# Patient Record
Sex: Male | Born: 1945 | Race: White | Hispanic: No | Marital: Married | Smoking: Former smoker
Health system: Southern US, Community
[De-identification: ages and names within clinical notes are randomized; demographics above are authoritative.]

## PROBLEM LIST (undated history)

## (undated) DIAGNOSIS — C4491 Basal cell carcinoma of skin, unspecified: Secondary | ICD-10-CM

## (undated) DIAGNOSIS — H269 Unspecified cataract: Secondary | ICD-10-CM

## (undated) DIAGNOSIS — Z803 Family history of malignant neoplasm of breast: Secondary | ICD-10-CM

## (undated) DIAGNOSIS — T7840XA Allergy, unspecified, initial encounter: Secondary | ICD-10-CM

## (undated) DIAGNOSIS — Z9861 Coronary angioplasty status: Secondary | ICD-10-CM

## (undated) DIAGNOSIS — Z800 Family history of malignant neoplasm of digestive organs: Secondary | ICD-10-CM

## (undated) DIAGNOSIS — R011 Cardiac murmur, unspecified: Secondary | ICD-10-CM

## (undated) DIAGNOSIS — I2510 Atherosclerotic heart disease of native coronary artery without angina pectoris: Secondary | ICD-10-CM

## (undated) DIAGNOSIS — L719 Rosacea, unspecified: Secondary | ICD-10-CM

## (undated) DIAGNOSIS — E785 Hyperlipidemia, unspecified: Secondary | ICD-10-CM

## (undated) DIAGNOSIS — K635 Polyp of colon: Secondary | ICD-10-CM

## (undated) DIAGNOSIS — I10 Essential (primary) hypertension: Secondary | ICD-10-CM

## (undated) DIAGNOSIS — E119 Type 2 diabetes mellitus without complications: Secondary | ICD-10-CM

## (undated) HISTORY — DX: Family history of malignant neoplasm of digestive organs: Z80.0

## (undated) HISTORY — PX: EYE SURGERY: SHX253

## (undated) HISTORY — DX: Rosacea, unspecified: L71.9

## (undated) HISTORY — DX: Polyp of colon: K63.5

## (undated) HISTORY — DX: Essential (primary) hypertension: I10

## (undated) HISTORY — PX: OTHER SURGICAL HISTORY: SHX169

## (undated) HISTORY — DX: Hyperlipidemia, unspecified: E78.5

## (undated) HISTORY — DX: Allergy, unspecified, initial encounter: T78.40XA

## (undated) HISTORY — DX: Family history of malignant neoplasm of breast: Z80.3

## (undated) HISTORY — DX: Atherosclerotic heart disease of native coronary artery without angina pectoris: I25.10

## (undated) HISTORY — DX: Type 2 diabetes mellitus without complications: E11.9

## (undated) HISTORY — DX: Cardiac murmur, unspecified: R01.1

## (undated) HISTORY — PX: BELPHAROPTOSIS REPAIR: SHX369

## (undated) HISTORY — DX: Unspecified cataract: H26.9

## (undated) HISTORY — DX: Atherosclerotic heart disease of native coronary artery without angina pectoris: Z98.61

## (undated) HISTORY — DX: Basal cell carcinoma of skin, unspecified: C44.91

---

## 1981-09-23 HISTORY — PX: SHOULDER OPEN ROTATOR CUFF REPAIR: SHX2407

## 2006-05-17 ENCOUNTER — Emergency Department (HOSPITAL_COMMUNITY): Admission: EM | Admit: 2006-05-17 | Discharge: 2006-05-17 | Payer: Self-pay | Admitting: Emergency Medicine

## 2012-07-09 ENCOUNTER — Ambulatory Visit (INDEPENDENT_AMBULATORY_CARE_PROVIDER_SITE_OTHER): Payer: BC Managed Care – PPO | Admitting: Emergency Medicine

## 2012-07-09 VITALS — BP 153/82 | HR 72 | Temp 97.4°F | Resp 18 | Ht 68.5 in | Wt 219.8 lb

## 2012-07-09 DIAGNOSIS — Z23 Encounter for immunization: Secondary | ICD-10-CM

## 2012-07-09 DIAGNOSIS — E1169 Type 2 diabetes mellitus with other specified complication: Secondary | ICD-10-CM | POA: Insufficient documentation

## 2012-07-09 DIAGNOSIS — N529 Male erectile dysfunction, unspecified: Secondary | ICD-10-CM

## 2012-07-09 DIAGNOSIS — E785 Hyperlipidemia, unspecified: Secondary | ICD-10-CM | POA: Insufficient documentation

## 2012-07-09 DIAGNOSIS — Z0000 Encounter for general adult medical examination without abnormal findings: Secondary | ICD-10-CM

## 2012-07-09 DIAGNOSIS — I10 Essential (primary) hypertension: Secondary | ICD-10-CM

## 2012-07-09 DIAGNOSIS — E782 Mixed hyperlipidemia: Secondary | ICD-10-CM

## 2012-07-09 DIAGNOSIS — E119 Type 2 diabetes mellitus without complications: Secondary | ICD-10-CM

## 2012-07-09 DIAGNOSIS — E1165 Type 2 diabetes mellitus with hyperglycemia: Secondary | ICD-10-CM | POA: Insufficient documentation

## 2012-07-09 LAB — POCT UA - MICROSCOPIC ONLY
Bacteria, U Microscopic: NEGATIVE
Crystals, Ur, HPF, POC: NEGATIVE
RBC, urine, microscopic: NEGATIVE
WBC, Ur, HPF, POC: NEGATIVE

## 2012-07-09 LAB — POCT URINALYSIS DIPSTICK
Bilirubin, UA: NEGATIVE
Glucose, UA: NEGATIVE
Leukocytes, UA: NEGATIVE
Nitrite, UA: NEGATIVE
Urobilinogen, UA: 0.2

## 2012-07-09 LAB — POCT CBC
Granulocyte percent: 61.7 %G (ref 37–80)
HCT, POC: 44.3 % (ref 43.5–53.7)
Hemoglobin: 14 g/dL — AB (ref 14.1–18.1)
Lymph, poc: 2.8 (ref 0.6–3.4)
MCHC: 31.6 g/dL — AB (ref 31.8–35.4)
MPV: 9.3 fL (ref 0–99.8)
POC Granulocyte: 5.3 (ref 2–6.9)
POC MID %: 6.1 %M (ref 0–12)
RBC: 4.7 M/uL (ref 4.69–6.13)

## 2012-07-09 LAB — COMPREHENSIVE METABOLIC PANEL WITH GFR
ALT: 31 U/L (ref 0–53)
AST: 22 U/L (ref 0–37)
Albumin: 4.8 g/dL (ref 3.5–5.2)
Alkaline Phosphatase: 52 U/L (ref 39–117)
BUN: 23 mg/dL (ref 6–23)
CO2: 25 meq/L (ref 19–32)
Calcium: 9.8 mg/dL (ref 8.4–10.5)
Chloride: 105 meq/L (ref 96–112)
Creat: 0.79 mg/dL (ref 0.50–1.35)
Glucose, Bld: 109 mg/dL — ABNORMAL HIGH (ref 70–99)
Potassium: 4.4 meq/L (ref 3.5–5.3)
Sodium: 139 meq/L (ref 135–145)
Total Bilirubin: 0.6 mg/dL (ref 0.3–1.2)
Total Protein: 7.3 g/dL (ref 6.0–8.3)

## 2012-07-09 LAB — IFOBT (OCCULT BLOOD): IFOBT: NEGATIVE

## 2012-07-09 LAB — LIPID PANEL: LDL Cholesterol: 127 mg/dL — ABNORMAL HIGH (ref 0–99)

## 2012-07-09 LAB — TSH: TSH: 1.387 u[IU]/mL (ref 0.350–4.500)

## 2012-07-09 NOTE — Progress Notes (Signed)
[facility] [address] [place] [address] [phone]  Date:  07/09/2012   Name:  Gregory Mathews.   DOB:  [birth year]   MRN:  [id number]  PCP:  No primary provider on file.    Chief Complaint: Annual Exam   History of Present Illness:  Gregory Mathews. is a 66 y.o. very pleasant male patient who presents with the following:  Wellness physical.  Says that his glucose runs less than 200 on oral agent.  Never less than 150.  Exercises on treadmill 30 minutes daily.  Last physical a year ago.  Colonoscopy 7 years ago.  Patient Active Problem List  Diagnosis  . Type II or unspecified type diabetes mellitus without mention of complication, not stated as uncontrolled  . Hyperlipidemia  . Erectile dysfunction    Past Medical History  Diagnosis Date  . Diabetes mellitus without complication     Past Surgical History  Procedure Date  . Shoulder open rotator cuff repair 1983    History  Substance Use Topics  . Smoking status: Former Smoker    Quit date: 07/09/1982  . Smokeless tobacco: Former Neurosurgeon    Quit date: 07/10/1983  . Alcohol Use: No    Family History  Problem Relation Age of Onset  . Cancer Mother   . Heart disease Father   . Diabetes Sister   . Heart disease Brother   . Cancer Maternal Grandmother   . Stroke Maternal Grandfather   . Heart disease Brother   . Diabetes Brother   . Cancer Brother     No Known Allergies  Medication list has been reviewed and updated.  Current Outpatient Prescriptions on File Prior to Visit  Medication Sig Dispense Refill  . glipiZIDE (GLUCOTROL) 5 MG tablet Take 5 mg by mouth 2 (two) times daily before a meal.      . lisinopril (PRINIVIL,ZESTRIL) 5 MG tablet Take 5 mg by mouth daily.        Review of Systems:  As per HPI, otherwise negative.    Physical Examination: Filed Vitals:   07/09/12 1027  BP: 153/82  Pulse: 72  Temp: 97.4 F (36.3 C)  Resp: 18   Filed Vitals:   07/09/12 1027  Height: 5' 8.5" (1.74 m)  Weight: 219 lb 12.8 oz (99.701 kg)   Body mass index is 32.93 kg/(m^2). Ideal Body Weight: Weight in (lb) to have BMI = 25: 166.5   GEN: WDWN, NAD, Non-toxic, A & O x 3 HEENT: Atraumatic, Normocephalic. Neck supple. No masses, No LAD. Ears and Nose: No external deformity. CV: RRR, No M/G/R. No JVD. No thrill. No extra heart sounds. PULM: CTA B, no wheezes, crackles, rhonchi. No retractions. No resp. distress. No accessory muscle use. ABD: S, NT, ND, +BS. No rebound. No HSM. EXTR: No c/c/e NEURO Normal gait.  PSYCH: Normally interactive. Conversant. Not depressed or anxious appearing.  Calm demeanor.  Rectal: normal exam  Assessment and Plan: Wellness exam Labs pending  Carmelina Dane, MD  I have reviewed and agree with documentation. Robert P. Merla Riches, M.D.

## 2012-07-10 LAB — VITAMIN D 25 HYDROXY (VIT D DEFICIENCY, FRACTURES): Vit D, 25-Hydroxy: 51 ng/mL (ref 30–89)

## 2012-07-10 LAB — PSA: PSA: 0.82 ng/mL (ref <=4.00)

## 2012-07-12 ENCOUNTER — Other Ambulatory Visit: Payer: Self-pay | Admitting: *Deleted

## 2012-07-12 MED ORDER — LISINOPRIL 5 MG PO TABS: 5.0000 mg | ORAL_TABLET | Freq: Every day | ORAL | Status: DC

## 2012-07-12 MED ORDER — GLIPIZIDE ER 10 MG PO TB24: 10.0000 mg | ORAL_TABLET | Freq: Every day | ORAL | Status: DC

## 2012-09-13 ENCOUNTER — Ambulatory Visit: Payer: BC Managed Care – PPO

## 2012-09-14 ENCOUNTER — Ambulatory Visit (INDEPENDENT_AMBULATORY_CARE_PROVIDER_SITE_OTHER): Payer: BC Managed Care – PPO | Admitting: Emergency Medicine

## 2012-09-14 VITALS — BP 156/79 | HR 80 | Temp 98.6°F | Resp 17 | Ht 69.0 in | Wt 226.0 lb

## 2012-09-14 DIAGNOSIS — J018 Other acute sinusitis: Secondary | ICD-10-CM

## 2012-09-14 DIAGNOSIS — J209 Acute bronchitis, unspecified: Secondary | ICD-10-CM

## 2012-09-14 MED ORDER — HYDROCOD POLST-CHLORPHEN POLST 10-8 MG/5ML PO LQCR: 5.0000 mL | Freq: Two times a day (BID) | ORAL | Status: DC | PRN

## 2012-09-14 MED ORDER — PSEUDOEPHEDRINE-GUAIFENESIN ER 60-600 MG PO TB12: 1.0000 | ORAL_TABLET | Freq: Two times a day (BID) | ORAL | Status: DC

## 2012-09-14 MED ORDER — AMOXICILLIN-POT CLAVULANATE 875-125 MG PO TABS: 1.0000 | ORAL_TABLET | Freq: Two times a day (BID) | ORAL | Status: DC

## 2012-09-14 NOTE — Progress Notes (Signed)
[facility] [address], [place] [address] [phone]  Date:  09/14/2012   Name:  Gregory Mathews.   DOB:  [birth year]   MRN:  [id number]  PCP:  No primary provider on file.    Chief Complaint: Sore Throat and Facial Pain   History of Present Illness:  Gregory Mathews. is a 66 y.o. very pleasant male patient who presents with the following:  Ill for one week.  Has nasal congestion and sinus pressure.  Purulent nasal drainage and post nasal drip.  Sore throat.  Cough productive purulent sputums.  No fever or chills, nausea or vomiting.  No wheezing or shortness of breath. 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  No improvement with OTC medication.  Patient Active Problem List  Diagnosis  . Type II or unspecified type diabetes mellitus without mention of complication, not stated as uncontrolled  . Hyperlipidemia  . Erectile dysfunction    Past Medical History  Diagnosis Date  . Diabetes mellitus without complication     Past Surgical History  Procedure Date  . Shoulder open rotator cuff repair 1983    History  Substance Use Topics  . Smoking status: Former Smoker    Quit date: 07/09/1982  . Smokeless tobacco: Former Neurosurgeon    Quit date: 07/10/1983  . Alcohol Use: No    Family History  Problem Relation Age of Onset  . Cancer Mother   . Heart disease Father   . Diabetes Sister   . Heart disease Brother   . Cancer Maternal Grandmother   . Stroke Maternal Grandfather   . Heart disease Brother   . Diabetes Brother   . Cancer Brother     No Known Allergies  Medication list has been reviewed and updated.  Current Outpatient Prescriptions on File Prior to Visit  Medication Sig Dispense Refill  . aspirin 81 MG tablet Take 81 mg by mouth daily.      Marland Kitchen glipiZIDE (GLUCOTROL XL) 10 MG 24 hr tablet Take 1 tablet (10 mg total) by mouth daily.  30 tablet  5  . lisinopril (PRINIVIL,ZESTRIL) 5 MG tablet Take 1 tablet (5 mg total) by mouth daily.   30 tablet  5  . Multiple Vitamin (MULTIVITAMIN) tablet Take 1 tablet by mouth daily.        Review of Systems:  As per HPI, otherwise negative.    Physical Examination: Filed Vitals:   09/14/12 0823  BP: 156/79  Pulse: 80  Temp: 98.6 F (37 C)  Resp: 17   Filed Vitals:   09/14/12 0823  Height: 5\' 9"  (1.753 m)  Weight: 226 lb (102.513 kg)   Body mass index is 33.37 kg/(m^2). Ideal Body Weight: Weight in (lb) to have BMI = 25: 168.9   GEN: WDWN, NAD, Non-toxic, A & O x 3 HEENT: Atraumatic, Normocephalic. Neck supple. No masses, No LAD. Ears and Nose: No external deformity. CV: RRR, No M/G/R. No JVD. No thrill. No extra heart sounds. PULM: CTA B, no wheezes, crackles, rhonchi. No retractions. No resp. distress. No accessory muscle use. ABD: S, NT, ND, +BS. No rebound. No HSM. EXTR: No c/c/e NEURO Normal gait.  PSYCH: Normally interactive. Conversant. Not depressed or anxious appearing.  Calm demeanor.    Assessment and Plan: Sinusitis Bronchitis augmentin mucinex tussionex Follow up as needed  Carmelina Dane, MD

## 2012-12-28 ENCOUNTER — Other Ambulatory Visit: Payer: Self-pay | Admitting: Emergency Medicine

## 2012-12-30 ENCOUNTER — Ambulatory Visit (INDEPENDENT_AMBULATORY_CARE_PROVIDER_SITE_OTHER): Payer: BC Managed Care – PPO | Admitting: Physician Assistant

## 2012-12-30 VITALS — BP 120/78 | HR 79 | Temp 97.9°F | Resp 18 | Wt 221.0 lb

## 2012-12-30 DIAGNOSIS — E119 Type 2 diabetes mellitus without complications: Secondary | ICD-10-CM

## 2012-12-30 DIAGNOSIS — M25559 Pain in unspecified hip: Secondary | ICD-10-CM

## 2012-12-30 DIAGNOSIS — N529 Male erectile dysfunction, unspecified: Secondary | ICD-10-CM

## 2012-12-30 DIAGNOSIS — M25519 Pain in unspecified shoulder: Secondary | ICD-10-CM

## 2012-12-30 DIAGNOSIS — E785 Hyperlipidemia, unspecified: Secondary | ICD-10-CM

## 2012-12-30 DIAGNOSIS — M25511 Pain in right shoulder: Secondary | ICD-10-CM

## 2012-12-30 DIAGNOSIS — M25551 Pain in right hip: Secondary | ICD-10-CM

## 2012-12-30 LAB — LIPID PANEL
HDL: 36 mg/dL — ABNORMAL LOW (ref >39)
LDL Cholesterol: 132 mg/dL — ABNORMAL HIGH (ref 0–99)
Triglycerides: 108 mg/dL (ref <150)
VLDL: 22 mg/dL (ref 0–40)

## 2012-12-30 LAB — COMPREHENSIVE METABOLIC PANEL
Albumin: 4.7 g/dL (ref 3.5–5.2)
Alkaline Phosphatase: 49 U/L (ref 39–117)
BUN: 16 mg/dL (ref 6–23)
Glucose, Bld: 140 mg/dL — ABNORMAL HIGH (ref 70–99)
Total Bilirubin: 0.7 mg/dL (ref 0.3–1.2)

## 2012-12-30 MED ORDER — GLIPIZIDE ER 10 MG PO TB24: 10.0000 mg | ORAL_TABLET | Freq: Every day | ORAL | Status: DC

## 2012-12-30 MED ORDER — LISINOPRIL 5 MG PO TABS: 5.0000 mg | ORAL_TABLET | Freq: Every day | ORAL | Status: DC

## 2012-12-30 MED ORDER — GLUCOSE BLOOD VI STRP: ORAL_STRIP | Status: DC

## 2012-12-30 MED ORDER — METFORMIN HCL 500 MG PO TABS: 500.0000 mg | ORAL_TABLET | Freq: Every day | ORAL | Status: DC

## 2012-12-30 NOTE — Patient Instructions (Addendum)
Consider reducing your starches, which can raise your glucose and weight.  As a substitute, eat more leafy green veggies. I encourage you to take ibuprofen 400-800 mg three times daily with food for 2-3 weeks to see if you can reduce the inflammation causing the pain in your shoulder and hip.  If it's not helpful, let me know if you're ready to see a specialist. METFORMIN: start this once daily, in the morning, with breakfast.  Initially, it may give you some GI upset, most likely diarrhea.  If you tolerate it, we'll consider increasing the dose and decreasing (and hopefully stopping) the glipizide.

## 2012-12-30 NOTE — Progress Notes (Signed)
Subjective:    Patient ID: Gregory Alstrom., male    DOB: Dec 22, 1945, 67 y.o.   MRN: [id number]  HPI This 67 y.o. male presents for evaluation of DM type 2.  Frequency of home glucose monitoring: QD "I can control mine when I eat right and exercise" 110'2-120's. Goal is to lose 20 lbs. Walks about 45 minutes a day. "My problem is at supper time, that's when I get really hungry." Working on eating lean proteins, increased vegetables, but eats a lot of starches. Sees a dentist Q6 months, eye specialist annually. Checks feet some days. Is current with influenza vaccine. Is current with pneumococcal vaccine.  He reports a history of GI upset with metformin, but also that he wasn't taking it with food, which was suggested as the reason.  Understanding that it's a better long-term choice for diabetes treatment, he's interested in trying it again.  Past Medical History  Diagnosis Date  . Diabetes mellitus without complication   . Cataract     Past Surgical History  Procedure Laterality Date  . Shoulder open rotator cuff repair  1983    Prior to Admission medications   Medication Sig Start Date End Date Taking? Authorizing Provider  aspirin 81 MG tablet Take 81 mg by mouth daily.   Yes Historical Provider, MD  glipiZIDE (GLUCOTROL XL) 10 MG 24 hr tablet Take 1 tablet (10 mg total) by mouth daily. 07/12/12  Yes Phillips Odor, MD  glucose blood (ACCU-CHEK AVIVA PLUS) test strip Use to test blood sugar daily 12/28/12  Yes Marzella Schlein McClung, PA-C  lisinopril (PRINIVIL,ZESTRIL) 5 MG tablet Take 1 tablet (5 mg total) by mouth daily. 07/12/12  Yes Phillips Odor, MD  Multiple Vitamin (MULTIVITAMIN) tablet Take 1 tablet by mouth daily.   Yes Historical Provider, MD    No Known Allergies  History   Social History  . Marital Status: Married    Spouse Name: Bonita Quin    Number of Children: 4  . Years of Education: 20   Occupational History  . bus driver/transportation [employer]   Social History Main Topics  . Smoking status: Former Smoker -- 1.00 packs/day for 15 years    Quit date: 07/09/1982  . Smokeless tobacco: Never Used  . Alcohol Use: No  . Drug Use: No  . Sexually Active: Yes -- Male partner(s)   Other Topics Concern  . Not on file   Social History Narrative   Doctoral degree in Biblical Studies.  Lives with his wife.  Adult children all live locally.    Family History  Problem Relation Age of Onset  . Cancer Mother     leukemia  . Stroke Mother   . Heart disease Father   . Stroke Father   . Diabetes Sister   . Heart disease Brother   . Cancer Maternal Grandmother   . Stroke Maternal Grandfather   . Diabetes Brother   . Diabetes Brother   . Cancer Brother     colon cancer   Review of Systems Denies shortness of breath, HA, dizziness, vision change, nausea, vomiting, diarrhea, constipation, melena, hematochezia, dysuria, increased urinary urgency or frequency, increased hunger or thirst, unintentional weight change, rash. With exercise, has noticed hip pain and RIGHT shoulder pain, upper chest wall pain bilaterally. X 4-5 months.  "I don't like taking ibuprofen."     Objective:   Physical Exam Blood pressure 120/78, pulse 79, temperature 97.9 F (36.6 C), temperature source Oral, resp. rate 18, weight  221 lb (100.245 kg). Body mass index is 32.62 kg/(m^2). Well-developed, well nourished WM who is awake, alert and oriented, in NAD. HEENT: [place]/AT, sclera and conjunctiva are clear.   Neck: supple, non-tender, no lymphadenopathy, thyromegaly. Heart: RRR, no murmur Lungs: normal effort, CTA Extremities: no cyanosis, clubbing or edema. Varicose veins noted in both lower extremities, R>L. Skin: warm and dry without rash. Psychologic: good mood and appropriate affect, normal speech and behavior.  See DM foot exam.  Results for orders placed in visit on 12/30/12  GLUCOSE, POCT (MANUAL RESULT ENTRY)      Result Value Range   POC  Glucose 132 (*) 70 - 99 mg/dl  POCT GLYCOSYLATED HEMOGLOBIN (HGB A1C)      Result Value Range   Hemoglobin A1C 7.4        Assessment & Plan:  Type II or unspecified type diabetes mellitus without mention of complication, not stated as uncontrolled - Plan: POCT glucose (manual entry), POCT glycosylated hemoglobin (Hb A1C), Comprehensive metabolic panel, Microalbumin, urine, glipiZIDE (GLUCOTROL XL) 10 MG 24 hr tablet, glucose blood (ACCU-CHEK AVIVA PLUS) test strip, lisinopril (PRINIVIL,ZESTRIL) 5 MG tablet, metFORMIN (GLUCOPHAGE) 500 MG tablet  Hyperlipidemia - Plan: Lipid panel  Erectile dysfunction  Pain in shoulder, right  Pain, hip, right  Patient Instructions  Consider reducing your starches, which can raise your glucose and weight.  As a substitute, eat more leafy green veggies. I encourage you to take ibuprofen 400-800 mg three times daily with food for 2-3 weeks to see if you can reduce the inflammation causing the pain in your shoulder and hip.  If it's not helpful, let me know if you're ready to see a specialist. METFORMIN: start this once daily, in the morning, with breakfast.  Initially, it may give you some GI upset, most likely diarrhea.  If you tolerate it, we'll consider increasing the dose and decreasing (and hopefully stopping) the glipizide.   Fernande Bras, PA-C Physician Assistant-Certified [facility]

## 2013-01-04 ENCOUNTER — Encounter: Payer: Self-pay | Admitting: Physician Assistant

## 2013-01-21 DIAGNOSIS — C4491 Basal cell carcinoma of skin, unspecified: Secondary | ICD-10-CM

## 2013-01-21 HISTORY — DX: Basal cell carcinoma of skin, unspecified: C44.91

## 2013-01-26 ENCOUNTER — Ambulatory Visit (INDEPENDENT_AMBULATORY_CARE_PROVIDER_SITE_OTHER): Payer: BC Managed Care – PPO | Admitting: Family Medicine

## 2013-01-26 VITALS — BP 150/80 | HR 91 | Temp 99.5°F | Resp 17 | Ht 69.0 in | Wt 221.0 lb

## 2013-01-26 DIAGNOSIS — J309 Allergic rhinitis, unspecified: Secondary | ICD-10-CM

## 2013-01-26 DIAGNOSIS — J302 Other seasonal allergic rhinitis: Secondary | ICD-10-CM | POA: Insufficient documentation

## 2013-01-26 MED ORDER — AMOXICILLIN-POT CLAVULANATE 875-125 MG PO TABS: 1.0000 | ORAL_TABLET | Freq: Two times a day (BID) | ORAL | Status: DC

## 2013-01-26 MED ORDER — FLUTICASONE PROPIONATE 50 MCG/ACT NA SUSP: 2.0000 | Freq: Every day | NASAL | Status: DC

## 2013-01-26 NOTE — Patient Instructions (Addendum)
Thank you for coming in today. I think you have allergies.  Take the zyrtec daily.  Use the flonase for at least a month.  Use the antibiotic in 2 weeks if not better.  Come back as needed.  Call or go to the emergency room if you get worse, have trouble breathing, have chest pains, or palpitations.

## 2013-01-26 NOTE — Progress Notes (Signed)
Siris A Travian Kerner. is a 67 y.o. male who presents to [facility] today for sore throat nasal congestion and runny nose along with itchy watery eyes for the last 2-3 weeks. Patient has tried Mucinex and some over-the-counter allergy medications which have only been marginally helpful.  He denies any chest pain palpitation or trouble breathing. Feels well otherwise. He knows the year ago he was given Augmentin which helped a bit.   PMH: Reviewed diabetes History  Substance Use Topics  . Smoking status: Former Smoker -- 1.00 packs/day for 15 years    Quit date: 07/09/1982  . Smokeless tobacco: Never Used  . Alcohol Use: No   ROS as above  Medications reviewed. Current Outpatient Prescriptions  Medication Sig Dispense Refill  . aspirin 81 MG tablet Take 81 mg by mouth daily.      Marland Kitchen glipiZIDE (GLUCOTROL XL) 10 MG 24 hr tablet Take 1 tablet (10 mg total) by mouth daily.  90 tablet  1  . glucose blood (ACCU-CHEK AVIVA PLUS) test strip Use to test blood sugar daily  100 each  5  . lisinopril (PRINIVIL,ZESTRIL) 5 MG tablet Take 1 tablet (5 mg total) by mouth daily.  90 tablet  1  . metFORMIN (GLUCOPHAGE) 500 MG tablet Take 1 tablet (500 mg total) by mouth daily with breakfast.  30 tablet  3  . Multiple Vitamin (MULTIVITAMIN) tablet Take 1 tablet by mouth daily.      Marland Kitchen amoxicillin-clavulanate (AUGMENTIN) 875-125 MG per tablet Take 1 tablet by mouth 2 (two) times daily.  20 tablet  0  . fluticasone (FLONASE) 50 MCG/ACT nasal spray Place 2 sprays into the nose daily.  16 g  6   No current facility-administered medications for this visit.    Exam:  BP 150/80  Pulse 91  Temp(Src) 99.5 F (37.5 C) (Oral)  Resp 17  Ht 5\' 9"  (1.753 m)  Wt 221 lb (100.245 kg)  BMI 32.62 kg/m2  SpO2 96% Gen: Well NAD HEENT: EOMI,  MMM, posterior pharynx cobblestoning. Tympanic membranes normal appearing Lungs: CTABL Nl WOB Heart: RRR no MRG Abd: NABS, NT, ND Exts: Non edematous BL  LE, warm and well perfused.    No results found for this or any previous visit (from the past 72 hour(s)).  Assessment and Plan: 67 y.o. male with seasonal allergies.  Flonase and Zyrtec. Discussed Augmentin. We'll prescribe for use if not improving in 2-3 weeks. Patient expresses understanding and agreement. Discussed warning signs or symptoms. Please see discharge instructions. Patient expresses understanding.

## 2013-03-30 ENCOUNTER — Ambulatory Visit (INDEPENDENT_AMBULATORY_CARE_PROVIDER_SITE_OTHER): Payer: BC Managed Care – PPO | Admitting: Physician Assistant

## 2013-03-30 VITALS — BP 134/72 | HR 77 | Temp 97.7°F | Resp 16 | Ht 69.0 in | Wt 219.0 lb

## 2013-03-30 DIAGNOSIS — E785 Hyperlipidemia, unspecified: Secondary | ICD-10-CM

## 2013-03-30 DIAGNOSIS — E782 Mixed hyperlipidemia: Secondary | ICD-10-CM

## 2013-03-30 DIAGNOSIS — E119 Type 2 diabetes mellitus without complications: Secondary | ICD-10-CM

## 2013-03-30 LAB — COMPREHENSIVE METABOLIC PANEL
ALT: 29 U/L (ref 0–53)
AST: 23 U/L (ref 0–37)
Albumin: 4.8 g/dL (ref 3.5–5.2)
BUN: 19 mg/dL (ref 6–23)
Calcium: 10 mg/dL (ref 8.4–10.5)
Chloride: 103 mEq/L (ref 96–112)
Potassium: 4.7 mEq/L (ref 3.5–5.3)

## 2013-03-30 LAB — LIPID PANEL: LDL Cholesterol: 116 mg/dL — ABNORMAL HIGH (ref 0–99)

## 2013-03-30 MED ORDER — LISINOPRIL 5 MG PO TABS: 5.0000 mg | ORAL_TABLET | Freq: Every day | ORAL | Status: DC

## 2013-03-30 MED ORDER — METFORMIN HCL 500 MG PO TABS: 500.0000 mg | ORAL_TABLET | Freq: Every day | ORAL | Status: DC

## 2013-03-30 MED ORDER — PRAVASTATIN SODIUM 20 MG PO TABS: 20.0000 mg | ORAL_TABLET | Freq: Every day | ORAL | Status: DC

## 2013-03-30 NOTE — Progress Notes (Signed)
Subjective:    Patient ID: Gregory Alstrom., male    DOB: 1945-12-04, 67 y.o.   MRN: [id number]  HPI History of Present Illness Gregory A Rayshaun Needle. is a 67 y.o. male presenting for 3 month follow-up on DM Type II.  He is in today to discuss his medication and have labs drawn.  He joined the [employer] in June.  He walks 45 min-1 hr a day plus swims for 30 min.  When inquiring about his diet, he said "my diet is my problem."  He describes getting very hungry sometimes and going on food bienges where he doesn't make good food choices, esp at night.  He is thinking about joining [employer] and has been to nutritional counseling.  He takes his blood sugar every morning which are recorded on his machine.  Usually ranges from 110-120.  Lowest = 89, Highest = 174  Denies polydipsia, polyphagia, polyuria, chest pain, shortness of breath, numbness, tingling, and myalgias.  The patient does see the eye doctor regularly.  His last appointment was in June.  He will have his cataracts taken out in September.      Past Medical History  Diagnosis Date  . Diabetes mellitus without complication   . Cataract   . Allergy   . Hyperlipidemia     Prior to Admission medications   Medication Sig Start Date End Date Taking? Authorizing Provider  aspirin 81 MG tablet Take 81 mg by mouth every other day.    Yes Historical Provider, MD  glipiZIDE (GLUCOTROL XL) 10 MG 24 hr tablet Take 1 tablet (10 mg total) by mouth daily. 12/30/12  Yes Chelle S Jeffery, PA-C  glucose blood (ACCU-CHEK AVIVA PLUS) test strip Use to test blood sugar daily 12/30/12  Yes Chelle S Jeffery, PA-C  lisinopril (PRINIVIL,ZESTRIL) 5 MG tablet Take 1 tablet (5 mg total) by mouth daily. 12/30/12  Yes Chelle S Jeffery, PA-C  metFORMIN (GLUCOPHAGE) 500 MG tablet Take 1 tablet (500 mg total) by mouth daily with breakfast. 12/30/12  Yes Chelle S Jeffery, PA-C  Multiple Vitamin (MULTIVITAMIN) tablet Take 1 tablet by mouth daily.   Yes Historical  Provider, MD  amoxicillin-clavulanate (AUGMENTIN) 875-125 MG per tablet Take 1 tablet by mouth 2 (two) times daily. 01/26/13   Rodolph Bong, MD  fluticasone (FLONASE) 50 MCG/ACT nasal spray Place 2 sprays into the nose daily. 01/26/13   Rodolph Bong, MD    No Known Allergies  Family History  Problem Relation Age of Onset  . Cancer Mother     leukemia  . Stroke Mother   . Heart disease Father   . Stroke Father   . Diabetes Sister   . Heart disease Brother   . Cancer Maternal Grandmother   . Stroke Maternal Grandfather   . Diabetes Brother   . Diabetes Brother   . Cancer Brother     colon cancer    History   Social History  . Marital Status: Married    Spouse Name: Bonita Quin    Number of Children: 4  . Years of Education: 20   Occupational History  . 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 Smoking status: Former Smoker -- 1.00 packs/day for 15 years    Quit date: 07/09/1982  . Smokeless tobacco: Never Used  . Alcohol Use: No  . Drug Use: No  . Sexually Active: Yes -- Male partner(s)   Other Topics Concern  . Not on file  Social History Narrative   Doctoral degree in Biblical Studies.  Lives with his wife.  Adult children all live locally.   Review of Systems ROS     As stated in HPI - otherwise negative.  Objective:   Physical Exam Filed Vitals:   03/30/13 0809  BP: 134/72  Pulse: 77  Temp: 97.7 F (36.5 C)  TempSrc: Oral  Resp: 16  Height: 5\' 9"  (1.753 m)  Weight: 219 lb (99.338 kg)  SpO2: 99%   General:  Overweight male in no acute distress. Cardiovascular:  S1 and S2 are distinct with no murmurs, rubs or gallops. Respiratory:  CTA.  No adventitious sounds. PVS:  No carotid bruits present.    Skin:  No visible lesions of feet. Neuro:  Normal monofilament testing.  2+ reflexes in lower extremities.  See Diabetic Foot Exam for more information.    Results for orders placed in visit on 03/30/13  POCT  GLYCOSYLATED HEMOGLOBIN (HGB A1C)      Result Value Range   Hemoglobin A1C 6.5         Assessment & Plan:  1. Diabetes mellitus, type 2 2. Type II or unspecified type diabetes mellitus without mention of complication, not stated as uncontrolled  The patient has had significant improvement in his HgbA1c.  As a result, discontinue Glipizide (Glucotrol) to prevent increased stress on the pancreas.  Continue to monitor blood sugars 3 times per week.  If they are regularly running >140, we will increase Metformin from once to twice daily.  Discussed option of Metformin ER if GI upset becomes a problem with QR.  He is tolerating the medication well at this point by taking it with his breakfast every morning.  Provided patient education on nutrition.  Discussed weight watchers at length.  Advised patient to adhere to their plan for accountability but cautioned against their pre-packaged meals.  Keep up great work on exercise.  I suspect if patient is able to continue exercise and change his eating habits he might be able to be taken off all meds.  Refill:   - metFORMIN (GLUCOPHAGE) 500 MG tablet; Take 1 tablet (500 mg total) by mouth daily with breakfast.  Dispense: 90 tablet; Refill: 0 - lisinopril (PRINIVIL,ZESTRIL) 5 MG tablet; Take 1 tablet (5 mg total) by mouth daily.  Dispense: 90 tablet; Refill: 0  Order: - Comprehensive metabolic panel - HgB A1c - Lipid Panel  3. Hyperlipidemia  Given his LDL has been elevated in the past, begin Pravachol for preventative care.  Discussed fish oil, red yeast rice, and garlic pills are ok to begin OTC.  Continue efforts for daily exercise and healthy eating habits.  Pt states he has had trouble in the past with myalgias and Statins but is unsure which ones.  We discussed what SE to be aware of and he will let us know.  Prescribe: - pravastatin (PRAVACHOL) 20 MG tablet; Take 1 tablet (20 mg total) by mouth daily.  Dispense: 90 tablet; Refill: 0  Order: -  Lipid Panel

## 2013-03-30 NOTE — Progress Notes (Signed)
I have examined the patient and was directly involved in the diagnosis and treatment plan.

## 2013-04-05 ENCOUNTER — Telehealth: Payer: Self-pay

## 2013-04-05 NOTE — Telephone Encounter (Signed)
PT STATES HE WOULD LIKE SARAH TO CHANGE HIS METFORMIN TO THE TIME RELEASE. PLEASE CALL [phone]    [facility] [address]

## 2013-04-06 MED ORDER — METFORMIN HCL ER 500 MG PO TB24: 500.0000 mg | ORAL_TABLET | Freq: Every day | ORAL | Status: DC

## 2013-04-06 NOTE — Telephone Encounter (Signed)
Patient advised it was sent in.

## 2013-04-06 NOTE — Telephone Encounter (Signed)
Sent metformin ER to pharmacy

## 2013-05-26 ENCOUNTER — Other Ambulatory Visit: Payer: Self-pay

## 2013-05-26 MED ORDER — LANCETS MISC: Status: DC

## 2013-05-26 MED ORDER — GLUCOSE BLOOD VI STRP: ORAL_STRIP | Status: DC

## 2013-06-23 ENCOUNTER — Other Ambulatory Visit: Payer: Self-pay | Admitting: Physician Assistant

## 2013-07-20 ENCOUNTER — Ambulatory Visit (INDEPENDENT_AMBULATORY_CARE_PROVIDER_SITE_OTHER): Payer: Medicare Other | Admitting: Emergency Medicine

## 2013-07-20 VITALS — BP 130/72 | HR 77 | Temp 98.1°F | Resp 18 | Ht 68.0 in | Wt 216.0 lb

## 2013-07-20 DIAGNOSIS — Z23 Encounter for immunization: Secondary | ICD-10-CM

## 2013-07-20 DIAGNOSIS — Z139 Encounter for screening, unspecified: Secondary | ICD-10-CM

## 2013-07-20 DIAGNOSIS — E785 Hyperlipidemia, unspecified: Secondary | ICD-10-CM

## 2013-07-20 DIAGNOSIS — Z0000 Encounter for general adult medical examination without abnormal findings: Secondary | ICD-10-CM

## 2013-07-20 DIAGNOSIS — E119 Type 2 diabetes mellitus without complications: Secondary | ICD-10-CM

## 2013-07-20 DIAGNOSIS — N529 Male erectile dysfunction, unspecified: Secondary | ICD-10-CM

## 2013-07-20 LAB — POCT URINALYSIS DIPSTICK
Bilirubin, UA: NEGATIVE
Blood, UA: NEGATIVE
Ketones, UA: NEGATIVE
Leukocytes, UA: NEGATIVE
Protein, UA: NEGATIVE
Spec Grav, UA: 1.02
Urobilinogen, UA: 0.2
pH, UA: 7.5

## 2013-07-20 LAB — POCT CBC
Granulocyte percent: 65.9 %G (ref 37–80)
HCT, POC: 44 % (ref 43.5–53.7)
Hemoglobin: 14.3 g/dL (ref 14.1–18.1)
MCH, POC: 31.6 pg — AB (ref 27–31.2)
MCV: 97.1 fL — AB (ref 80–97)
MID (cbc): 0.4 (ref 0–0.9)
POC LYMPH PERCENT: 28.5 %L (ref 10–50)
RDW, POC: 14.1 %
WBC: 7.6 10*3/uL (ref 4.6–10.2)

## 2013-07-20 LAB — COMPREHENSIVE METABOLIC PANEL
Albumin: 4.6 g/dL (ref 3.5–5.2)
BUN: 21 mg/dL (ref 6–23)
CO2: 24 mEq/L (ref 19–32)
Creat: 0.85 mg/dL (ref 0.50–1.35)
Glucose, Bld: 112 mg/dL — ABNORMAL HIGH (ref 70–99)
Total Bilirubin: 0.6 mg/dL (ref 0.3–1.2)

## 2013-07-20 LAB — MICROALBUMIN, URINE: Microalb, Ur: 0.53 mg/dL (ref 0.00–1.89)

## 2013-07-20 LAB — POCT UA - MICROSCOPIC ONLY
Bacteria, U Microscopic: NEGATIVE
Casts, Ur, LPF, POC: NEGATIVE
Crystals, Ur, HPF, POC: NEGATIVE
Epithelial cells, urine per micros: NEGATIVE
RBC, urine, microscopic: NEGATIVE
WBC, Ur, HPF, POC: NEGATIVE

## 2013-07-20 LAB — LIPID PANEL
Cholesterol: 178 mg/dL (ref 0–200)
HDL: 40 mg/dL (ref >39)
Triglycerides: 122 mg/dL (ref <150)
VLDL: 24 mg/dL (ref 0–40)

## 2013-07-20 LAB — VITAMIN D 25 HYDROXY (VIT D DEFICIENCY, FRACTURES): Vit D, 25-Hydroxy: 39 ng/mL (ref 30–89)

## 2013-07-20 LAB — PSA: PSA: 0.67 ng/mL (ref <=4.00)

## 2013-07-20 LAB — TSH: TSH: 1.556 u[IU]/mL (ref 0.350–4.500)

## 2013-07-20 LAB — IFOBT (OCCULT BLOOD): IFOBT: NEGATIVE

## 2013-07-20 NOTE — Progress Notes (Signed)
[facility] [address] [place] [address] [phone]  Date:  07/20/2013   Name:  Gregory Mathews.   DOB:  04-28-1946   MRN:  [id number]  PCP:  Carmelina Dane, MD    Chief Complaint: Annual Exam and Flu Vaccine   History of Present Illness:  Gregory Mathews. is a 67 y.o. very pleasant male patient who presents with the following:  History of NIDDM and elevated cholesterol.  Says he is experiencing hypoglycemic episodes on his current regimen.  FBS today 99.  Nonsmoker.  Needs flu shot  No improvement with over the counter medications or other home remedies. Denies other complaint or health concern today.   Patient Active Problem List   Diagnosis Date Noted  . Seasonal allergies 01/26/2013  . Type II or unspecified type diabetes mellitus without mention of complication, not stated as uncontrolled 07/09/2012  . Hyperlipidemia 07/09/2012  . Erectile dysfunction 07/09/2012    Past Medical History  Diagnosis Date  . Diabetes mellitus without complication   . Cataract   . Allergy   . Hyperlipidemia     Past Surgical History  Procedure Laterality Date  . Shoulder open rotator cuff repair  1983  . Eye surgery      History  Substance Use Topics  . Smoking status: Former Smoker -- 1.00 packs/day for 15 years    Quit date: 07/09/1982  . Smokeless tobacco: Never Used  . Alcohol Use: No    Family History  Problem Relation Age of Onset  . Cancer Mother     leukemia  . Stroke Mother   . Heart disease Father   . Stroke Father   . Diabetes Sister   . Heart disease Brother   . Cancer Maternal Grandmother   . Stroke Maternal Grandfather   . Diabetes Brother   . Diabetes Brother   . Cancer Brother     colon cancer    No Known Allergies  Medication list has been reviewed and updated.  Current Outpatient Prescriptions on File Prior to Visit  Medication Sig Dispense Refill  . ACCU-CHEK AVIVA PLUS test strip USE TO TEST BLOOD  SUGAR DAILY  100 each  3  . aspirin 81 MG tablet Take 81 mg by mouth every other day.       Marland Kitchen glucose blood (ACCU-CHEK AVIVA PLUS) test strip Use to test blood sugar daily  100 each  5  . glucose blood test strip Use to test blood sugar daily. Dx code: 250.00  100 each  2  . Lancets MISC Use to test blood sugar daily. Dx code: 250.00  100 each  2  . lisinopril (PRINIVIL,ZESTRIL) 5 MG tablet Take 1 tablet (5 mg total) by mouth daily.  90 tablet  0  . metFORMIN (GLUCOPHAGE-XR) 500 MG 24 hr tablet Take 1 tablet (500 mg total) by mouth daily with breakfast.  90 tablet  0  . Multiple Vitamin (MULTIVITAMIN) tablet Take 1 tablet by mouth daily.      Marland Kitchen amoxicillin-clavulanate (AUGMENTIN) 875-125 MG per tablet Take 1 tablet by mouth 2 (two) times daily.  20 tablet  0  . fluticasone (FLONASE) 50 MCG/ACT nasal spray Place 2 sprays into the nose daily.  16 g  6  . pravastatin (PRAVACHOL) 20 MG tablet Take 1 tablet (20 mg total) by mouth daily.  90 tablet  0   No current facility-administered medications on file prior to visit.    Review  of Systems:  As per HPI, otherwise negative.    Physical Examination: Filed Vitals:   07/20/13 0808  BP: 130/72  Pulse: 77  Temp: 98.1 F (36.7 C)  Resp: 18   Filed Vitals:   07/20/13 0808  Height: 5\' 8"  (1.727 m)  Weight: 216 lb (97.977 kg)   Body mass index is 32.85 kg/(m^2). Ideal Body Weight: Weight in (lb) to have BMI = 25: 164.1  GEN: WDWN, NAD, Non-toxic, A & O x 3 HEENT: Atraumatic, Normocephalic. Neck supple. No masses, No LAD. Ears and Nose: No external deformity. CV: RRR, No M/G/R. No JVD. No thrill. No extra heart sounds. PULM: CTA B, no wheezes, crackles, rhonchi. No retractions. No resp. distress. No accessory muscle use. ABD: S, NT, ND, +BS. No rebound. No HSM. EXTR: No c/c/e NEURO Normal gait.  PSYCH: Normally interactive. Conversant. Not depressed or anxious appearing.  Calm demeanor.  DRE:  Normal.  Occult blood  pending  Assessment and Plan: Wellness examination Labs pending   Signed,  Phillips Odor, MD

## 2013-07-22 MED ORDER — TESTOSTERONE 20.25 MG/1.25GM (1.62%) TD GEL: 4.0000 "application " | Freq: Every day | TRANSDERMAL | Status: DC

## 2013-07-23 ENCOUNTER — Other Ambulatory Visit: Payer: Self-pay | Admitting: Physician Assistant

## 2013-07-26 ENCOUNTER — Telehealth: Payer: Self-pay

## 2013-07-26 NOTE — Telephone Encounter (Signed)
Patient requesting refill on his metformin

## 2013-07-26 NOTE — Telephone Encounter (Signed)
Pt is calling because he had a CPE last week and he hasnt heard anything back from that, and he also states that his prescription refills have not been sent to the [facility] on [address] Call back number is [phone]

## 2013-07-26 NOTE — Telephone Encounter (Signed)
This was sent in  

## 2013-07-26 NOTE — Telephone Encounter (Signed)
Labs have not been reviewed. Can we review these so we can inform pt? Also he states his meds have not been [facility] to [facility] [address].

## 2013-07-29 ENCOUNTER — Other Ambulatory Visit: Payer: Self-pay | Admitting: Emergency Medicine

## 2013-07-29 DIAGNOSIS — J302 Other seasonal allergic rhinitis: Secondary | ICD-10-CM

## 2013-07-29 DIAGNOSIS — E785 Hyperlipidemia, unspecified: Secondary | ICD-10-CM

## 2013-07-29 DIAGNOSIS — E119 Type 2 diabetes mellitus without complications: Secondary | ICD-10-CM

## 2013-07-29 MED ORDER — GLIPIZIDE 10 MG PO TABS: 10.0000 mg | ORAL_TABLET | ORAL | Status: DC

## 2013-07-29 MED ORDER — METFORMIN HCL ER 500 MG PO TB24: 1000.0000 mg | ORAL_TABLET | Freq: Every day | ORAL | Status: DC

## 2013-07-29 MED ORDER — FLUTICASONE PROPIONATE 50 MCG/ACT NA SUSP: 2.0000 | Freq: Every day | NASAL | Status: DC

## 2013-07-29 MED ORDER — LISINOPRIL 5 MG PO TABS: 5.0000 mg | ORAL_TABLET | Freq: Every day | ORAL | Status: DC

## 2013-07-29 MED ORDER — PRAVASTATIN SODIUM 40 MG PO TABS: 20.0000 mg | ORAL_TABLET | Freq: Every day | ORAL | Status: DC

## 2013-07-29 NOTE — Telephone Encounter (Signed)
Labs reviewed.  His LDL is hign, so I increased his medication.  Follow up in 6 months

## 2013-07-30 ENCOUNTER — Telehealth: Payer: Self-pay

## 2013-07-30 NOTE — Telephone Encounter (Signed)
Pt would like to know that results of his recent labs. [phone]

## 2013-07-30 NOTE — Telephone Encounter (Signed)
I believe a message was previously sent.  His labs looked good but for his testosterone was a little low.

## 2013-07-30 NOTE — Telephone Encounter (Signed)
Patient advised. He was asking for cholesterol numbers. He is asking about his testosterone, he wants to know if he can decrease some of his diabetes medications. He also wants to know if you will consider treating him with testosterone injections.

## 2013-07-30 NOTE — Telephone Encounter (Signed)
Called to advise, left message for call back

## 2013-07-30 NOTE — Telephone Encounter (Signed)
He indicates he can not increase the metformin, it caused diarrhea at higher dose. He does not want Androgel, indicated he tried before, it did not help.  Please advise.

## 2013-07-30 NOTE — Telephone Encounter (Signed)
I printed a prescription for androgel for him.  I do not treat low testosterone with shots.    Why not send him a copy of his numbers.  I increased his metformin.  He can stop the glucotrol and follow up in a month for repeat sugar.

## 2013-08-02 ENCOUNTER — Other Ambulatory Visit: Payer: Self-pay | Admitting: Physician Assistant

## 2013-08-02 MED ORDER — TESTOSTERONE 50 MG/5GM (1%) TD GEL: 5.0000 g | Freq: Every day | TRANSDERMAL | Status: DC

## 2013-08-02 MED ORDER — GLIPIZIDE 10 MG PO TABS: 10.0000 mg | ORAL_TABLET | ORAL | Status: DC

## 2013-08-02 NOTE — Addendum Note (Signed)
Addended by: Carmelina Dane on: 08/02/2013 04:59 PM   Modules accepted: Orders

## 2013-08-02 NOTE — Telephone Encounter (Signed)
Sent refill on his meds.  He can come by and pick up a prescription for testim.

## 2013-08-03 ENCOUNTER — Other Ambulatory Visit: Payer: Self-pay | Admitting: Radiology

## 2013-08-03 NOTE — Telephone Encounter (Signed)
Of course it is completed and can be closed, Dr Dareen Piano took care of it. Have closed.

## 2013-08-03 NOTE — Telephone Encounter (Signed)
Amy, do you know if this is completed and can be closed?

## 2013-08-04 NOTE — Telephone Encounter (Signed)
Notified pt that all cholesterol meds are statins. Pt understood. I offered him a referral to a urologist who would do testosterone shots. Pt seemed to think he would try the gel first.

## 2013-08-04 NOTE — Telephone Encounter (Signed)
Dr Dareen Piano-- Per Pt, statins do not work good for him. They leave him feeling tired/lethargic. Can we switch him to another cholesterol med besides statin?  Also, he states the testosterone gel does not work good for him either. He was inquiring about having shots..is this something we would want to consider?

## 2013-08-04 NOTE — Telephone Encounter (Signed)
1.  ALL cholesterol medications are statins. 2.  I don't prescribe testoserone shots.  If there is someone else in the office that does, we can ask them to see him.

## 2013-08-18 ENCOUNTER — Ambulatory Visit (INDEPENDENT_AMBULATORY_CARE_PROVIDER_SITE_OTHER): Payer: Medicare Other | Admitting: Emergency Medicine

## 2013-08-18 ENCOUNTER — Ambulatory Visit: Payer: Medicare Other

## 2013-08-18 ENCOUNTER — Telehealth: Payer: Self-pay

## 2013-08-18 VITALS — BP 126/72 | HR 78 | Temp 97.8°F | Resp 18 | Ht 69.0 in | Wt 224.0 lb

## 2013-08-18 DIAGNOSIS — M79671 Pain in right foot: Secondary | ICD-10-CM

## 2013-08-18 DIAGNOSIS — J329 Chronic sinusitis, unspecified: Secondary | ICD-10-CM

## 2013-08-18 DIAGNOSIS — M79609 Pain in unspecified limb: Secondary | ICD-10-CM

## 2013-08-18 MED ORDER — AMOXICILLIN 875 MG PO TABS: 875.0000 mg | ORAL_TABLET | Freq: Two times a day (BID) | ORAL | Status: DC

## 2013-08-18 NOTE — Progress Notes (Signed)
Subjective:  This chart was scribed for Lesle Chris, MD by Carl Best, Medical Scribe. This patient was seen in Room 2 and the patient's care was started at 10:05 AM.   Patient ID: Gregory A Teresa Coombs., male    DOB: [birth year], 67 y.o.   MRN: [id number]  HPI HPI Comments: Gregory A Daeshawn Redmann. is a 67 y.o. male who presents to the [facility] complaining of an injury to his right foot that occurred yesterday when he fell down a ladder while he was putting up gutter guards.  He states that his leg twisted around the ladder as he was falling.  He states that he can still walk on it and that it does not hurt.  He states that he has taken Ibuprofen for his symptoms with relief to his pain.    The patient also complains of sinus pressure that started last night after he was out in the cold too long.  He states that he drained his sinuses slightly this morning.  He states that he took Flonase from his symptoms with no relief.  He lists mild sore throat as an associated symptom.    Past Medical History  Diagnosis Date  . Diabetes mellitus without complication   . Cataract   . Allergy   . Hyperlipidemia    Past Surgical History  Procedure Laterality Date  . Shoulder open rotator cuff repair  1983  . Eye surgery     Family History  Problem Relation Age of Onset  . Cancer Mother     leukemia  . Stroke Mother   . Heart disease Father   . Stroke Father   . Diabetes Sister   . Heart disease Brother   . Cancer Maternal Grandmother   . Stroke Maternal Grandfather   . Diabetes Brother   . Diabetes Brother   . Cancer Brother     colon cancer   History   Social History  . Marital Status: Married    Spouse Name: Bonita Quin    Number of Children: 4  . Years of Education: 20   Occupational History  . 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 Smoking status: Former Smoker -- 1.00 packs/day for 15 years    Quit date: 07/09/1982  .  Smokeless tobacco: Never Used  . Alcohol Use: No  . Drug Use: No  . Sexual Activity: Yes    Partners: Female   Other Topics Concern  . Not on file   Social History Narrative   Doctoral degree in Biblical Studies.  Lives with his wife.  Adult children all live locally.   No Known Allergies  Current outpatient prescriptions:ACCU-CHEK AVIVA PLUS test strip, USE TO TEST BLOOD SUGAR DAILY, Disp: 100 each, Rfl: 3;  aspirin 81 MG tablet, Take 81 mg by mouth every other day. , Disp: , Rfl: ;  glipiZIDE (GLUCOTROL XL) 10 MG 24 hr tablet, TAKE ONE TABLET BY MOUTH ONCE DAILY, Disp: 90 tablet, Rfl: 0;  glipiZIDE (GLUCOTROL) 10 MG tablet, Take 1 tablet (10 mg total) by mouth 1 day or 1 dose., Disp: 90 tablet, Rfl: 1 glucose blood (ACCU-CHEK AVIVA PLUS) test strip, Use to test blood sugar daily, Disp: 100 each, Rfl: 5;  glucose blood test strip, Use to test blood sugar daily. Dx code: 250.00, Disp: 100 each, Rfl: 2;  Lancets MISC, Use to test blood sugar daily. Dx code: 250.00, Disp: 100 each, Rfl: 2;  lisinopril (PRINIVIL,ZESTRIL)  5 MG tablet, Take 1 tablet (5 mg total) by mouth daily., Disp: 90 tablet, Rfl: 0 metFORMIN (GLUCOPHAGE) 500 MG tablet, TAKE 1 TABLET (500 MG TOTAL) BY MOUTH DAILY WITH BREAKFAST., Disp: 90 tablet, Rfl: 1;  Multiple Vitamin (MULTIVITAMIN) tablet, Take 1 tablet by mouth daily., Disp: , Rfl: ;  amoxicillin-clavulanate (AUGMENTIN) 875-125 MG per tablet, Take 1 tablet by mouth 2 (two) times daily., Disp: 20 tablet, Rfl: 0;  fluticasone (FLONASE) 50 MCG/ACT nasal spray, Place 2 sprays into both nostrils daily., Disp: 16 g, Rfl: 12 metFORMIN (GLUCOPHAGE-XR) 500 MG 24 hr tablet, Take 2 tablets (1,000 mg total) by mouth daily with breakfast., Disp: 180 tablet, Rfl: 1;  pravastatin (PRAVACHOL) 40 MG tablet, Take 0.5 tablets (20 mg total) by mouth daily., Disp: 90 tablet, Rfl: 1;  Testosterone (ANDROGEL) 20.25 MG/1.25GM (1.62%) GEL, Place 4 application onto the skin daily., Disp: 5 g, Rfl:  5 testosterone (TESTIM) 50 MG/5GM GEL, Place 5 g onto the skin daily., Disp: 30 Tube, Rfl: 5    Review of Systems  HENT: Positive for sinus pressure, sore throat and voice change.   Musculoskeletal: Positive for arthralgias (right foot) and joint swelling (right foot).  Skin: Positive for color change (ecchymosis on right leg).  All other systems reviewed and are negative.     Objective:  Physical Exam  Nursing note and vitals reviewed. Constitutional: He is oriented to person, place, and time. He appears well-developed and well-nourished. No distress.  HENT:  Head: Normocephalic and atraumatic.  Right Ear: External ear normal.  Left Ear: External ear normal.  Nose: Right sinus exhibits maxillary sinus tenderness. Left sinus exhibits maxillary sinus tenderness.  Mouth/Throat: Posterior oropharyngeal erythema present. No oropharyngeal exudate.  Mild redness of posterior pharynx, mild tenderness over maxillary sinuses  Eyes: EOM are normal. Pupils are equal, round, and reactive to light.  Neck: Normal range of motion. Neck supple. No thyromegaly present.  Cardiovascular: Normal rate, regular rhythm and normal heart sounds.  Exam reveals no gallop and no friction rub.   No murmur heard. Pulmonary/Chest: Effort normal and breath sounds normal. No respiratory distress. He has no wheezes. He has no rales.  Abdominal: Soft. Bowel sounds are normal. There is no tenderness.  Musculoskeletal:       Right lower leg: He exhibits swelling.       Right foot: He exhibits swelling.  Ecchymosis and swelling over proximal right calf, swelling and ecchymosis over the later distal right foot.   Lymphadenopathy:    He has no cervical adenopathy.  Neurological: He is alert and oriented to person, place, and time.  Skin: Skin is warm and dry.   [facility] reading (PRIMARY) by  Dr. Cleta Alberts no fracture seen       Assessment & Plan:   no fracture seen on his films. We'll treat his sinuses with  amoxicillin and nasal sprays with saline

## 2013-08-18 NOTE — Telephone Encounter (Signed)
Received a denial of coverage for Testim because pt has not tried and had skin irritation from Androgel or Axiron, which are preferred alternatives. Pt had tried Androgel in the past and found it ineffective (according to OV notes). Dr Dareen Piano, do you want to send in a Rx for Axiron for the pt to try?

## 2013-08-19 NOTE — Telephone Encounter (Signed)
Please ask him to come in an see me about this

## 2013-08-23 NOTE — Telephone Encounter (Signed)
Called and spoke to patient in regards to this. Stated it would be a while before he was able to return to clinic to discuss this as he would be out of town. Also stated that he would try to call first of next week to set up appt to come in and discuss this.

## 2013-09-29 ENCOUNTER — Other Ambulatory Visit: Payer: Self-pay | Admitting: Physician Assistant

## 2013-12-21 ENCOUNTER — Ambulatory Visit (INDEPENDENT_AMBULATORY_CARE_PROVIDER_SITE_OTHER): Payer: Medicare Other | Admitting: Internal Medicine

## 2013-12-21 VITALS — BP 140/82 | HR 80 | Temp 98.0°F | Resp 16 | Ht 68.5 in | Wt 220.8 lb

## 2013-12-21 DIAGNOSIS — E785 Hyperlipidemia, unspecified: Secondary | ICD-10-CM

## 2013-12-21 DIAGNOSIS — Z419 Encounter for procedure for purposes other than remedying health state, unspecified: Secondary | ICD-10-CM

## 2013-12-21 DIAGNOSIS — Z7189 Other specified counseling: Secondary | ICD-10-CM

## 2013-12-21 DIAGNOSIS — E119 Type 2 diabetes mellitus without complications: Secondary | ICD-10-CM

## 2013-12-21 LAB — POCT URINALYSIS DIPSTICK
Bilirubin, UA: NEGATIVE
Blood, UA: NEGATIVE
GLUCOSE UA: NEGATIVE
Ketones, UA: NEGATIVE
Leukocytes, UA: NEGATIVE
NITRITE UA: NEGATIVE
SPEC GRAV UA: 1.02
UROBILINOGEN UA: 0.2
pH, UA: 6.5

## 2013-12-21 LAB — COMPREHENSIVE METABOLIC PANEL
ALK PHOS: 52 U/L (ref 39–117)
ALT: 40 U/L (ref 0–53)
AST: 34 U/L (ref 0–37)
Albumin: 4.8 g/dL (ref 3.5–5.2)
BUN: 21 mg/dL (ref 6–23)
CO2: 28 mEq/L (ref 19–32)
Calcium: 10.2 mg/dL (ref 8.4–10.5)
Chloride: 101 mEq/L (ref 96–112)
Creat: 0.85 mg/dL (ref 0.50–1.35)
Glucose, Bld: 132 mg/dL — ABNORMAL HIGH (ref 70–99)
Potassium: 4.7 mEq/L (ref 3.5–5.3)
SODIUM: 139 meq/L (ref 135–145)
Total Bilirubin: 0.8 mg/dL (ref 0.2–1.2)
Total Protein: 7.8 g/dL (ref 6.0–8.3)

## 2013-12-21 LAB — POCT CBC
GRANULOCYTE PERCENT: 65.3 % (ref 37–80)
HCT, POC: 45.6 % (ref 43.5–53.7)
Hemoglobin: 14.7 g/dL (ref 14.1–18.1)
Lymph, poc: 2.4 (ref 0.6–3.4)
MCH: 30.4 pg (ref 27–31.2)
MCHC: 32.2 g/dL (ref 31.8–35.4)
MCV: 94.2 fL (ref 80–97)
MID (CBC): 0.5 (ref 0–0.9)
MPV: 8.9 fL (ref 0–99.8)
PLATELET COUNT, POC: 252 10*3/uL (ref 142–424)
POC Granulocyte: 5.6 (ref 2–6.9)
POC LYMPH %: 28.8 % (ref 10–50)
POC MID %: 5.9 % (ref 0–12)
RBC: 4.84 M/uL (ref 4.69–6.13)
RDW, POC: 13.5 %
WBC: 8.5 10*3/uL (ref 4.6–10.2)

## 2013-12-21 LAB — POCT GLYCOSYLATED HEMOGLOBIN (HGB A1C): Hemoglobin A1C: 7

## 2013-12-21 LAB — PROTIME-INR
INR: 1.01 (ref <1.50)
PROTHROMBIN TIME: 13.2 s (ref 11.6–15.2)

## 2013-12-21 LAB — APTT: APTT: 32 s (ref 24–37)

## 2013-12-21 LAB — GLUCOSE, POCT (MANUAL RESULT ENTRY): POC Glucose: 136 mg/dl — AB (ref 70–99)

## 2013-12-21 MED ORDER — METFORMIN HCL 500 MG PO TABS: 500.0000 mg | ORAL_TABLET | Freq: Every day | ORAL | Status: DC

## 2013-12-21 MED ORDER — GLIPIZIDE ER 10 MG PO TB24: 10.0000 mg | ORAL_TABLET | Freq: Every day | ORAL | Status: DC

## 2013-12-21 MED ORDER — LISINOPRIL 5 MG PO TABS: 5.0000 mg | ORAL_TABLET | Freq: Every day | ORAL | Status: DC

## 2013-12-21 NOTE — Progress Notes (Signed)
Subjective:    Patient ID: Gregory Mathews., male    DOB: 07/05/1946, 68 y.o.   MRN: [id number]  HPI67 yr old Caucasian male is here today for medication refill and  Medical clearance for surgery he is preparing to have. Pt states he is having ligaments in both eyelids shortened and some skin removed to improve his peripheral vision. The medications he will need refills on are Glipizide, Metformin and Lisinopril. He has no other complaints. His diabetes and lipids are controlled. No hx of heart disease, lung disease. He feels good and has no complaints.   Review of Systems     Objective:   Physical Exam  Vitals reviewed. Constitutional: He is oriented to person, place, and time. He appears well-developed and well-nourished. No distress.  HENT:  Head: Normocephalic.  Right Ear: External ear normal.  Left Ear: External ear normal.  Nose: Nose normal.  Mouth/Throat: Oropharynx is clear and moist.  Eyes: Conjunctivae and EOM are normal. Pupils are equal, round, and reactive to light.  Neck: Normal range of motion. Neck supple. No tracheal deviation present. No thyromegaly present.  Cardiovascular: Normal rate, regular rhythm, normal heart sounds and intact distal pulses.   Pulmonary/Chest: Effort normal and breath sounds normal.  Abdominal: Soft. Bowel sounds are normal. He exhibits no mass. There is no tenderness.  Musculoskeletal: Normal range of motion.  Lymphadenopathy:    He has no cervical adenopathy.  Neurological: He is alert and oriented to person, place, and time. No cranial nerve deficit. He exhibits normal muscle tone. Coordination normal.  Psychiatric: He has a normal mood and affect. His behavior is normal. Judgment and thought content normal.    Results for orders placed in visit on 12/21/13  POCT CBC      Result Value Ref Range   WBC 8.5  4.6 - 10.2 K/uL   Lymph, poc 2.4  0.6 - 3.4   POC LYMPH PERCENT 28.8  10 - 50 %L   MID (cbc) 0.5  0 - 0.9   POC MID % 5.9   0 - 12 %M   POC Granulocyte 5.6  2 - 6.9   Granulocyte percent 65.3  37 - 80 %G   RBC 4.84  4.69 - 6.13 M/uL   Hemoglobin 14.7  14.1 - 18.1 g/dL   HCT, POC 45.6  43.5 - 53.7 %   MCV 94.2  80 - 97 fL   MCH, POC 30.4  27 - 31.2 pg   MCHC 32.2  31.8 - 35.4 g/dL   RDW, POC 13.5     Platelet Count, POC 252  142 - 424 K/uL   MPV 8.9  0 - 99.8 fL  GLUCOSE, POCT (MANUAL RESULT ENTRY)      Result Value Ref Range   POC Glucose 136 (*) 70 - 99 mg/dl  POCT GLYCOSYLATED HEMOGLOBIN (HGB A1C)      Result Value Ref Range   Hemoglobin A1C 7.0    POCT URINALYSIS DIPSTICK      Result Value Ref Range   Color, UA yellow     Clarity, UA clear     Glucose, UA neg     Bilirubin, UA neg     Ketones, UA neg     Spec Grav, UA 1.020     Blood, UA neg     pH, UA 6.5     Protein, UA trace     Urobilinogen, UA 0.2     Nitrite, UA neg  Leukocytes, UA Negative     EKG normal      Assessment & Plan:  NIDDM controlled/Lipids controlled

## 2013-12-21 NOTE — Patient Instructions (Signed)
Cholesterol Cholesterol is a white, waxy, fat-like protein needed by your body in small amounts. The liver makes all the cholesterol you need. It is carried from the liver by the blood through the blood vessels. Deposits (plaque) may build up on blood vessel walls. This makes the arteries narrower and stiffer. Plaque increases the risk for heart attack and stroke. You cannot feel your cholesterol level even if it is very high. The only way to know is by a blood test to check your lipid (fats) levels. Once you know your cholesterol levels, you should keep a record of the test results. Work with your caregiver to to keep your levels in the desired range. WHAT THE RESULTS MEAN:  Total cholesterol is a rough measure of all the cholesterol in your blood.  LDL is the so-called bad cholesterol. This is the type that deposits cholesterol in the walls of the arteries. You want this level to be low.  HDL is the good cholesterol because it cleans the arteries and carries the LDL away. You want this level to be high.  Triglycerides are fat that the body can either burn for energy or store. High levels are closely linked to heart disease. DESIRED LEVELS:  Total cholesterol below 200.  LDL below 100 for people at risk, below 70 for very high risk.  HDL above 50 is good, above 60 is best.  Triglycerides below 150. HOW TO LOWER YOUR CHOLESTEROL:  Diet.  Choose fish or white meat chicken and [place], roasted or baked. Limit fatty cuts of red meat, fried foods, and processed meats, such as sausage and lunch meat.  Eat lots of fresh fruits and vegetables. Choose whole grains, beans, pasta, potatoes and cereals.  Use only small amounts of olive, corn or canola oils. Avoid butter, mayonnaise, shortening or palm kernel oils. Avoid foods with trans-fats.  Use skim/nonfat milk and low-fat/nonfat yogurt and cheeses. Avoid whole milk, cream, ice cream, egg yolks and cheeses. Healthy desserts include angel food  cake, ginger snaps, animal crackers, hard candy, popsicles, and low-fat/nonfat frozen yogurt. Avoid pastries, cakes, pies and cookies.  Exercise.  A regular program helps decrease LDL and raises HDL.  Helps with weight control.  Do things that increase your activity level like gardening, walking, or taking the stairs.  Medication.  May be prescribed by your caregiver to help lowering cholesterol and the risk for heart disease.  You may need medicine even if your levels are normal if you have several risk factors. HOME CARE INSTRUCTIONS   Follow your diet and exercise programs as suggested by your caregiver.  Take medications as directed.  Have blood work done when your caregiver feels it is necessary. MAKE SURE YOU:   Understand these instructions.  Will watch your condition.  Will get help right away if you are not doing well or get worse. Document Released: 06/04/2001 Document Revised: 12/02/2011 Document Reviewed: 06/23/2013 [facility] Patient Information 2014 [place], [place]. Calorie Counting Diet A calorie counting diet requires you to eat the number of calories that are right for you in a day. Calories are the measurement of how much energy you get from the food you eat. Eating the right amount of calories is important for staying at a healthy weight. If you eat too many calories, your body will store them as fat and you may gain weight. If you eat too few calories, you may lose weight. Counting the number of calories you eat during a day will help you know if you  are eating the right amount. A Registered Dietitian can determine how many calories you need in a day. The amount of calories needed varies from person to person. If your goal is to lose weight, you will need to eat fewer calories. Losing weight can benefit you if you are overweight or have health problems such as heart disease, high blood pressure, or diabetes. If your goal is to gain weight, you will need to eat more  calories. Gaining weight may be necessary if you have a certain health problem that causes your body to need more energy. TIPS Whether you are increasing or decreasing the number of calories you eat during a day, it may be hard to get used to changes in what you eat and drink. The following are tips to help you keep track of the number of calories you eat.  Measure foods at home with measuring cups. This helps you know the amount of food and number of calories you are eating.  Restaurants often serve food in amounts that are larger than 1 serving. While eating out, estimate how many servings of a food you are given. For example, a serving of cooked rice is  cup or about the size of half of a fist. Knowing serving sizes will help you be aware of how much food you are eating at restaurants.  Ask for smaller portion sizes or child-size portions at restaurants.  Plan to eat half of a meal at a restaurant. Take the rest home or share the other half with a friend.  Read the Nutrition Facts panel on food labels for calorie content and serving size. You can find out how many servings are in a package, the size of a serving, and the number of calories each serving has.  For example, a package might contain 3 cookies. The Nutrition Facts panel on that package says that 1 serving is 1 cookie. Below that, it will say there are 3 servings in the container. The calories section of the Nutrition Facts label says there are 90 calories. This means there are 90 calories in 1 cookie (1 serving). If you eat 1 cookie you have eaten 90 calories. If you eat all 3 cookies, you have eaten 270 calories (3 servings x 90 calories = 270 calories). The list below tells you how big or small some common portion sizes are.  1 oz.........4 stacked dice.  3 oz........Marland KitchenDeck of cards.  1 tsp.......Marland KitchenTip of little finger.  1 tbs......Marland KitchenMarland KitchenThumb.  2 tbs.......Marland KitchenGolf ball.   cup......Marland KitchenHalf of a fist.  1 cup.......Marland KitchenA fist. KEEP A  FOOD LOG Write down every food item you eat, the amount you eat, and the number of calories in each food you eat during the day. At the end of the day, you can add up the total number of calories you have eaten. It may help to keep a list like the one below. Find out the calorie information by reading the Nutrition Facts panel on food labels. Breakfast  Bran cereal (1 cup, 110 calories).  Fat-free milk ( cup, 45 calories). Snack  Apple (1 medium, 80 calories). Lunch  Spinach (1 cup, 20 calories).  Tomato ( medium, 20 calories).  Chicken breast strips (3 oz, 165 calories).  Shredded cheddar cheese ( cup, 110 calories).  Light [place] dressing (2 tbs, 60 calories).  Whole-wheat bread (1 slice, 80 calories).  Tub margarine (1 tsp, 35 calories).  Vegetable soup (1 cup, 160 calories). Dinner  Pork chop (3 oz, 190 calories).  Brown rice (1 cup, 215 calories).  Steamed broccoli ( cup, 20 calories).  Strawberries (1  cup, 65 calories).  Whipped cream (1 tbs, 50 calories). Daily Calorie Total: 4765 Document Released: 09/09/2005 Document Revised: 12/02/2011 Document Reviewed: 03/06/2007 [facility] Patient Information 2014 [place].

## 2013-12-25 ENCOUNTER — Encounter: Payer: Self-pay | Admitting: *Deleted

## 2013-12-30 ENCOUNTER — Encounter: Payer: Self-pay | Admitting: Gastroenterology

## 2014-01-11 ENCOUNTER — Other Ambulatory Visit: Payer: Self-pay

## 2014-02-09 ENCOUNTER — Ambulatory Visit (AMBULATORY_SURGERY_CENTER): Payer: Self-pay | Admitting: *Deleted

## 2014-02-09 VITALS — Ht 68.5 in | Wt 220.6 lb

## 2014-02-09 DIAGNOSIS — Z1211 Encounter for screening for malignant neoplasm of colon: Secondary | ICD-10-CM

## 2014-02-09 DIAGNOSIS — Z86010 Personal history of colonic polyps: Secondary | ICD-10-CM

## 2014-02-09 MED ORDER — NA SULFATE-K SULFATE-MG SULF 17.5-3.13-1.6 GM/177ML PO SOLN: ORAL | Status: DC

## 2014-02-09 NOTE — Progress Notes (Signed)
Patient denies any allergies to eggs or soy. Patient denies any problems with anesthesia/sedation. Patient denies any oxygen use at home and does not take any diet/weight loss medications. EMMI education assisgned to patient on colonoscopy, this was explained and instructions given to patient. 

## 2014-02-10 ENCOUNTER — Encounter: Payer: Self-pay | Admitting: Gastroenterology

## 2014-02-25 ENCOUNTER — Encounter: Payer: Self-pay | Admitting: Gastroenterology

## 2014-02-25 ENCOUNTER — Ambulatory Visit (AMBULATORY_SURGERY_CENTER): Payer: Medicare Other | Admitting: Gastroenterology

## 2014-02-25 VITALS — BP 109/73 | HR 61 | Temp 98.1°F | Resp 20 | Ht 68.5 in | Wt 220.0 lb

## 2014-02-25 DIAGNOSIS — Z8371 Family history of colonic polyps: Secondary | ICD-10-CM

## 2014-02-25 DIAGNOSIS — D126 Benign neoplasm of colon, unspecified: Secondary | ICD-10-CM

## 2014-02-25 DIAGNOSIS — Z1211 Encounter for screening for malignant neoplasm of colon: Secondary | ICD-10-CM

## 2014-02-25 DIAGNOSIS — K5730 Diverticulosis of large intestine without perforation or abscess without bleeding: Secondary | ICD-10-CM

## 2014-02-25 DIAGNOSIS — K648 Other hemorrhoids: Secondary | ICD-10-CM

## 2014-02-25 DIAGNOSIS — Z800 Family history of malignant neoplasm of digestive organs: Secondary | ICD-10-CM

## 2014-02-25 MED ORDER — SODIUM CHLORIDE 0.9 % IV SOLN: 500.0000 mL | INTRAVENOUS | Status: DC

## 2014-02-25 NOTE — Progress Notes (Signed)
Called to room to assist during endoscopic procedure.  Patient ID and intended procedure confirmed with present staff. Received instructions for my participation in the procedure from the performing physician.  

## 2014-02-25 NOTE — Patient Instructions (Signed)
YOU HAD AN ENDOSCOPIC PROCEDURE TODAY AT THE [facility]: Refer to the procedure report that was given to you for any specific questions about what was found during the examination.  If the procedure report does not answer your questions, please call your gastroenterologist to clarify.  If you requested that your care partner not be given the details of your procedure findings, then the procedure report has been included in a sealed envelope for you to review at your convenience later.  YOU SHOULD EXPECT: Some feelings of bloating in the abdomen. Passage of more gas than usual.  Walking can help get rid of the air that was put into your GI tract during the procedure and reduce the bloating. If you had a lower endoscopy (such as a colonoscopy or flexible sigmoidoscopy) you may notice spotting of blood in your stool or on the toilet paper. If you underwent a bowel prep for your procedure, then you may not have a normal bowel movement for a few days.  DIET: Your first meal following the procedure should be a light meal and then it is ok to progress to your normal diet.  A half-sandwich or bowl of soup is an example of a good first meal.  Heavy or fried foods are harder to digest and may make you feel nauseous or bloated.  Likewise meals heavy in dairy and vegetables can cause extra gas to form and this can also increase the bloating.  Drink plenty of fluids but you should avoid alcoholic beverages for 24 hours.  ACTIVITY: Your care partner should take you home directly after the procedure.  You should plan to take it easy, moving slowly for the rest of the day.  You can resume normal activity the day after the procedure however you should NOT DRIVE or use heavy machinery for 24 hours (because of the sedation medicines used during the test).    SYMPTOMS TO REPORT IMMEDIATELY: A gastroenterologist can be reached at any hour.  During normal business hours, 8:30 AM to 5:00 PM Monday through Friday,  call [phone].  After hours and on weekends, please call the GI answering service at [phone] who will take a message and have the physician on call contact you.   Following lower endoscopy (colonoscopy or flexible sigmoidoscopy):  Excessive amounts of blood in the stool  Significant tenderness or worsening of abdominal pains  Swelling of the abdomen that is new, acute  Fever of 100F or higher  FOLLOW UP: If any biopsies were taken you will be contacted by phone or by letter within the next 1-3 weeks.  Call your gastroenterologist if you have not heard about the biopsies in 3 weeks.  Our staff will call the home number listed on your records the next business day following your procedure to check on you and address any questions or concerns that you may have at that time regarding the information given to you following your procedure. This is a courtesy call and so if there is no answer at the home number and we have not heard from you through the emergency physician on call, we will assume that you have returned to your regular daily activities without incident.  SIGNATURES/CONFIDENTIALITY: You and/or your care partner have signed paperwork which will be entered into your electronic medical record.  These signatures attest to the fact that that the information above on your After Visit Summary has been reviewed and is understood.  Full responsibility of the confidentiality of this   discharge information lies with you and/or your care-partner.  Polyps, diverticulosis, high fiber, hemorrhoids-handouts given  Repeat colonoscopy in 5 years.

## 2014-02-25 NOTE — Progress Notes (Signed)
A/ox3, pleased with MAC, report to RN 

## 2014-02-25 NOTE — Op Note (Signed)
[facility]  [address] [place], [id number]   COLONOSCOPY PROCEDURE REPORT  PATIENT: Gregory, Mathews  MR#: [id number] BIRTHDATE: 01-12-1946 , 13  yrs. old GENDER: Male ENDOSCOPIST: Inda Castle, MD REFERRED BY: PROCEDURE DATE:  02/25/2014 PROCEDURE:   Colonoscopy with snare polypectomy First Screening Colonoscopy - Avg.  risk and is 50 yrs.  old or older - No.  Prior Negative Screening - Now for repeat screening. 10 or more years since last screening  History of Adenoma - Now for follow-up colonoscopy & has been > or = to 3 yrs.  N/A  Polyps Removed Today? Yes. ASA CLASS:   Class II INDICATIONS:Patient's immediate family history of colon cancer. father and brother MEDICATIONS: MAC sedation, administered by CRNA and Propofol (Diprivan) 320 mg IV  DESCRIPTION OF PROCEDURE:   After the risks benefits and alternatives of the procedure were thoroughly explained, informed consent was obtained.  A digital rectal exam revealed no abnormalities of the rectum.   The [id number]  endoscope was introduced through the anus and advanced to the cecum, which was identified by both the appendix and ileocecal valve. No adverse events experienced.   The quality of the prep was Suprep good  The instrument was then slowly withdrawn as the colon was fully examined.      COLON FINDINGS: Two sessile polyps measuring 4 and 6 mm in size were found in the ascending colon.  A polypectomy was performed with a cold snare.  The resection was complete and the polyp tissue was completely retrieved.   Mild diverticulosis was noted in the sigmoid colon.   Internal hemorrhoids were found.  Retroflexed views revealed no abnormalities. The time to cecum=4 minutes 18 seconds.  Withdrawal time=11 minutes 38 seconds.  The scope was withdrawn and the procedure completed. COMPLICATIONS: There were no complications.  ENDOSCOPIC IMPRESSION: 1.   Two sessile polyps measuring 4  and 6 mm in size were found in the ascending colon; polypectomy was performed with a cold snare 2.   Mild diverticulosis was noted in the sigmoid colon 3.   Internal hemorrhoids  RECOMMENDATIONS: Given your significant family history of colon cancer, you should have a repeat colonoscopy in 5 years   eSigned:  Inda Castle, MD 02/25/2014 2:05 PM   cc: Annye Asa, MD   PATIENT NAME:  Gregory Mathews MR#: [id number]

## 2014-02-28 ENCOUNTER — Telehealth: Payer: Self-pay

## 2014-02-28 NOTE — Telephone Encounter (Signed)
Left a message at [phone] for the pt to call back if any questions or concerns. maw

## 2014-03-02 ENCOUNTER — Encounter: Payer: Self-pay | Admitting: Gastroenterology

## 2014-03-14 ENCOUNTER — Encounter: Payer: BC Managed Care – PPO | Admitting: Gastroenterology

## 2014-05-31 ENCOUNTER — Other Ambulatory Visit: Payer: Self-pay | Admitting: Dermatology

## 2014-07-08 ENCOUNTER — Other Ambulatory Visit: Payer: Self-pay

## 2014-07-19 ENCOUNTER — Ambulatory Visit (INDEPENDENT_AMBULATORY_CARE_PROVIDER_SITE_OTHER): Payer: Medicare Other | Admitting: Internal Medicine

## 2014-07-19 VITALS — BP 126/70 | HR 70 | Temp 98.3°F | Resp 18 | Ht 69.5 in | Wt 219.0 lb

## 2014-07-19 DIAGNOSIS — E109 Type 1 diabetes mellitus without complications: Secondary | ICD-10-CM

## 2014-07-19 DIAGNOSIS — Z23 Encounter for immunization: Secondary | ICD-10-CM

## 2014-07-19 DIAGNOSIS — Z7189 Other specified counseling: Secondary | ICD-10-CM

## 2014-07-19 DIAGNOSIS — E119 Type 2 diabetes mellitus without complications: Secondary | ICD-10-CM

## 2014-07-19 DIAGNOSIS — Z419 Encounter for procedure for purposes other than remedying health state, unspecified: Secondary | ICD-10-CM

## 2014-07-19 DIAGNOSIS — L719 Rosacea, unspecified: Secondary | ICD-10-CM

## 2014-07-19 DIAGNOSIS — E785 Hyperlipidemia, unspecified: Secondary | ICD-10-CM

## 2014-07-19 DIAGNOSIS — Z79899 Other long term (current) drug therapy: Secondary | ICD-10-CM

## 2014-07-19 LAB — COMPREHENSIVE METABOLIC PANEL
ALT: 34 U/L (ref 0–53)
AST: 29 U/L (ref 0–37)
Albumin: 4.8 g/dL (ref 3.5–5.2)
Alkaline Phosphatase: 48 U/L (ref 39–117)
BUN: 16 mg/dL (ref 6–23)
CALCIUM: 10.1 mg/dL (ref 8.4–10.5)
CHLORIDE: 103 meq/L (ref 96–112)
CO2: 24 meq/L (ref 19–32)
CREATININE: 0.85 mg/dL (ref 0.50–1.35)
Glucose, Bld: 137 mg/dL — ABNORMAL HIGH (ref 70–99)
Potassium: 5 mEq/L (ref 3.5–5.3)
Sodium: 140 mEq/L (ref 135–145)
Total Bilirubin: 0.7 mg/dL (ref 0.2–1.2)
Total Protein: 7.6 g/dL (ref 6.0–8.3)

## 2014-07-19 LAB — POCT CBC
Granulocyte percent: 65.1 %G (ref 37–80)
HEMATOCRIT: 45.5 % (ref 43.5–53.7)
Hemoglobin: 14.6 g/dL (ref 14.1–18.1)
LYMPH, POC: 2.3 (ref 0.6–3.4)
MCH, POC: 29.6 pg (ref 27–31.2)
MCHC: 32 g/dL (ref 31.8–35.4)
MCV: 92.4 fL (ref 80–97)
MID (cbc): 0.5 (ref 0–0.9)
MPV: 7.9 fL (ref 0–99.8)
POC Granulocyte: 5.3 (ref 2–6.9)
POC LYMPH PERCENT: 28.7 %L (ref 10–50)
POC MID %: 6.2 %M (ref 0–12)
Platelet Count, POC: 210 10*3/uL (ref 142–424)
RBC: 4.92 M/uL (ref 4.69–6.13)
RDW, POC: 13.5 %
WBC: 8.1 10*3/uL (ref 4.6–10.2)

## 2014-07-19 LAB — GLUCOSE, POCT (MANUAL RESULT ENTRY): POC GLUCOSE: 135 mg/dL — AB (ref 70–99)

## 2014-07-19 LAB — POCT GLYCOSYLATED HEMOGLOBIN (HGB A1C): Hemoglobin A1C: 6.8

## 2014-07-19 LAB — LIPID PANEL
Cholesterol: 178 mg/dL (ref 0–200)
HDL: 38 mg/dL — AB (ref >39)
LDL Cholesterol: 118 mg/dL — ABNORMAL HIGH (ref 0–99)
TRIGLYCERIDES: 109 mg/dL (ref <150)
Total CHOL/HDL Ratio: 4.7 Ratio
VLDL: 22 mg/dL (ref 0–40)

## 2014-07-19 MED ORDER — GLIPIZIDE 5 MG PO TABS: 5.0000 mg | ORAL_TABLET | Freq: Every day | ORAL | Status: DC

## 2014-07-19 MED ORDER — PRAVASTATIN SODIUM 40 MG PO TABS: 20.0000 mg | ORAL_TABLET | Freq: Every day | ORAL | Status: DC

## 2014-07-19 MED ORDER — LISINOPRIL 5 MG PO TABS: 5.0000 mg | ORAL_TABLET | Freq: Every day | ORAL | Status: DC

## 2014-07-19 MED ORDER — METFORMIN HCL 500 MG PO TABS: 500.0000 mg | ORAL_TABLET | Freq: Two times a day (BID) | ORAL | Status: DC

## 2014-07-19 NOTE — Patient Instructions (Signed)
Diabetes and Exercise Exercising regularly is important. It is not just about losing weight. It has many health benefits, such as:  Improving your overall fitness, flexibility, and endurance.  Increasing your bone density.  Helping with weight control.  Decreasing your body fat.  Increasing your muscle strength.  Reducing stress and tension.  Improving your overall health. People with diabetes who exercise gain additional benefits because exercise:  Reduces appetite.  Improves the body's use of blood sugar (glucose).  Helps lower or control blood glucose.  Decreases blood pressure.  Helps control blood lipids (such as cholesterol and triglycerides).  Improves the body's use of the hormone insulin by:  Increasing the body's insulin sensitivity.  Reducing the body's insulin needs.  Decreases the risk for heart disease because exercising:  Lowers cholesterol and triglycerides levels.  Increases the levels of good cholesterol (such as high-density lipoproteins [HDL]) in the body.  Lowers blood glucose levels. YOUR ACTIVITY PLAN  Choose an activity that you enjoy and set realistic goals. Your health care provider or diabetes educator can help you make an activity plan that works for you. Exercise regularly as directed by your health care provider. This includes:  Performing resistance training twice a week such as push-ups, sit-ups, lifting weights, or using resistance bands.  Performing 150 minutes of cardio exercises each week such as walking, running, or playing sports.  Staying active and spending no more than 90 minutes at one time being inactive. Even short bursts of exercise are good for you. Three 10-minute sessions spread throughout the day are just as beneficial as a single 30-minute session. Some exercise ideas include:  Taking the dog for a walk.  Taking the stairs instead of the elevator.  Dancing to your favorite song.  Doing an exercise  video.  Doing your favorite exercise with a friend. RECOMMENDATIONS FOR EXERCISING WITH TYPE 1 OR TYPE 2 DIABETES   Check your blood glucose before exercising. If blood glucose levels are greater than 240 mg/dL, check for urine ketones. Do not exercise if ketones are present.  Avoid injecting insulin into areas of the body that are going to be exercised. For example, avoid injecting insulin into:  The arms when playing tennis.  The legs when jogging.  Keep a record of:  Food intake before and after you exercise.  Expected peak times of insulin action.  Blood glucose levels before and after you exercise.  The type and amount of exercise you have done.  Review your records with your health care provider. Your health care provider will help you to develop guidelines for adjusting food intake and insulin amounts before and after exercising.  If you take insulin or oral hypoglycemic agents, watch for signs and symptoms of hypoglycemia. They include:  Dizziness.  Shaking.  Sweating.  Chills.  Confusion.  Drink plenty of water while you exercise to prevent dehydration or heat stroke. Body water is lost during exercise and must be replaced.  Talk to your health care provider before starting an exercise program to make sure it is safe for you. Remember, almost any type of activity is better than none. Document Released: 11/30/2003 Document Revised: 01/24/2014 Document Reviewed: 02/16/2013 ExitCare Patient Information 2015 [employer]. This information is not intended to replace advice given to you by your health care provider. Make sure you discuss any questions you have with your health care provider.  

## 2014-07-19 NOTE — Progress Notes (Signed)
   Subjective:    Patient ID: Gregory Mathews., male    DOB: [birth year], 68 y.o.   MRN: [id number]  HPI He feels well and doing well. No sxs of low blood sugar. Tolerates meds. Has no established primary. Urine for microalbumin normal Colonoscopy UTD Review of Systems     Objective:   Physical Exam  Constitutional: He is oriented to person, place, and time. He appears well-developed and well-nourished. No distress.  HENT:  Head: Normocephalic.  Eyes: EOM are normal. No scleral icterus.  Neck: Normal range of motion.  Cardiovascular: Normal rate, regular rhythm and normal heart sounds.   No murmur heard. Pulmonary/Chest: Effort normal and breath sounds normal.  Musculoskeletal: Normal range of motion.  Neurological: He is alert and oriented to person, place, and time. He has normal strength. No cranial nerve deficit or sensory deficit. He exhibits normal muscle tone. Coordination normal.  Psychiatric: He has a normal mood and affect. His behavior is normal.    Labs ordered Results for orders placed in visit on 07/19/14  POCT CBC      Result Value Ref Range   WBC 8.1  4.6 - 10.2 K/uL   Lymph, poc 2.3  0.6 - 3.4   POC LYMPH PERCENT 28.7  10 - 50 %L   MID (cbc) 0.5  0 - 0.9   POC MID % 6.2  0 - 12 %M   POC Granulocyte 5.3  2 - 6.9   Granulocyte percent 65.1  37 - 80 %G   RBC 4.92  4.69 - 6.13 M/uL   Hemoglobin 14.6  14.1 - 18.1 g/dL   HCT, POC 45.5  43.5 - 53.7 %   MCV 92.4  80 - 97 fL   MCH, POC 29.6  27 - 31.2 pg   MCHC 32.0  31.8 - 35.4 g/dL   RDW, POC 13.5     Platelet Count, POC 210  142 - 424 K/uL   MPV 7.9  0 - 99.8 fL  GLUCOSE, POCT (MANUAL RESULT ENTRY)      Result Value Ref Range   POC Glucose 135 (*) 70 - 99 mg/dl  POCT GLYCOSYLATED HEMOGLOBIN (HGB A1C)      Result Value Ref Range   Hemoglobin A1C 6.8           Assessment & Plan:  Establish primary at 104 Change meds: Metformin 500mg  BID, glipizide 5mg  qam RF all meds 6 months

## 2014-07-20 LAB — PSA: PSA: 0.72 ng/mL (ref <=4.00)

## 2014-07-28 LAB — HM DIABETES EYE EXAM

## 2014-08-20 ENCOUNTER — Other Ambulatory Visit: Payer: Self-pay | Admitting: Physician Assistant

## 2014-08-22 ENCOUNTER — Encounter: Payer: Self-pay | Admitting: Family Medicine

## 2014-08-22 ENCOUNTER — Ambulatory Visit (INDEPENDENT_AMBULATORY_CARE_PROVIDER_SITE_OTHER): Payer: Medicare Other | Admitting: Family Medicine

## 2014-08-22 VITALS — BP 124/76 | HR 77 | Temp 98.2°F | Resp 16 | Ht 68.5 in | Wt 217.8 lb

## 2014-08-22 DIAGNOSIS — Z111 Encounter for screening for respiratory tuberculosis: Secondary | ICD-10-CM

## 2014-08-22 DIAGNOSIS — Z0000 Encounter for general adult medical examination without abnormal findings: Secondary | ICD-10-CM

## 2014-08-22 DIAGNOSIS — E119 Type 2 diabetes mellitus without complications: Secondary | ICD-10-CM

## 2014-08-22 DIAGNOSIS — E785 Hyperlipidemia, unspecified: Secondary | ICD-10-CM

## 2014-08-22 DIAGNOSIS — Z419 Encounter for procedure for purposes other than remedying health state, unspecified: Secondary | ICD-10-CM

## 2014-08-22 DIAGNOSIS — Z026 Encounter for examination for insurance purposes: Secondary | ICD-10-CM

## 2014-08-22 DIAGNOSIS — L719 Rosacea, unspecified: Secondary | ICD-10-CM

## 2014-08-22 DIAGNOSIS — E109 Type 1 diabetes mellitus without complications: Secondary | ICD-10-CM

## 2014-08-22 DIAGNOSIS — Z23 Encounter for immunization: Secondary | ICD-10-CM

## 2014-08-22 DIAGNOSIS — C44311 Basal cell carcinoma of skin of nose: Secondary | ICD-10-CM

## 2014-08-22 DIAGNOSIS — Z79899 Other long term (current) drug therapy: Secondary | ICD-10-CM

## 2014-08-22 DIAGNOSIS — Z7189 Other specified counseling: Secondary | ICD-10-CM

## 2014-08-22 LAB — POCT URINALYSIS DIPSTICK
Bilirubin, UA: NEGATIVE
Blood, UA: NEGATIVE
Glucose, UA: NEGATIVE
KETONES UA: NEGATIVE
Leukocytes, UA: NEGATIVE
Nitrite, UA: NEGATIVE
PH UA: 7
PROTEIN UA: NEGATIVE
SPEC GRAV UA: 1.015
Urobilinogen, UA: 0.2

## 2014-08-22 NOTE — Patient Instructions (Signed)

## 2014-08-22 NOTE — Progress Notes (Signed)
Subjective:    Patient ID: Gregory Mathews., male    DOB: May 24, 1946, 68 y.o.   MRN: [id number]  08/22/2014  Annual Exam; Diabetes; and Hyperlipidemia   HPI This 68 y.o. male presents for Annual Wellness Examination.   Last physical:  07/20/13 [place] Colonoscopy: 02/2014 two polyps; IH; diverticulosis; repeat in 5 years.  Deatra Ina. TDAP:  Not sure; at [facility]. Pneumovax:  07/09/2012 Zostavax:  2007 Influenza:  07/19/2014 Eye exam:  06/2014; [facility].; s/p cataract resection and B eyelid surgery. Dental exam:   Every six months.   Basal Cell Carcinoma nasal: by Dr. Jarome Matin.  Another basal cell removed.  Followed every six months.  Rosascea: will having lasering procedure of nose in upcoming months.   Taking PCN daily.    Hyperlipidemia: not taking Pravastatin due to myalgias.  S/p recent lipid panel.  DMII: s/p follow-up with Dr. Elder Cyphers 07/19/14.  HgbA1c of 6.8.  Dr. Elder Cyphers recommended weaning off of Glipizide and increasing Metformin to 1000mg  daily.  Sugars run high with Metformin 1000mg  daily and without Glipizide. Pt aware that can increase Metformin to 2000mg  daily but Glipizide has been working fine for patient.    Tb screening: needs Tb screening for [facility]. Was just seen at [facility] for Tb skin test in 04/2014; hopes that skin test will cover employment form.  Prostate cancer screening: desires DRE but wants a male provider to complete. Had PSA at visit in 07/19/14 but provider would not perform DRE at visit.  Tuberculosis Risk Questionnaire  1. No Were you born outside the [place] in one of the following parts of the world: [place], [place], [place], [place] or [place]?    2. No Have you traveled outside the [place] and lived for more than one month in one of the following parts of the world: [place], [place], [place], [place] or [place]?    3. Yes  Do you have a compromised immune system such as from any of the following  conditions:HIV/AIDS, organ or bone marrow transplantation, diabetes, immunosuppressive medicines (e.g. Prednisone, Remicaide), leukemia, lymphoma, cancer of the head or neck, gastrectomy or jejunal bypass, end-stage renal disease (on dialysis), or silicosis?     4. No Have you ever or do you plan on working in: a residential care center, a health care facility, a jail or prison or homeless shelter?    5. No Have you ever: injected illegal drugs, used crack cocaine, lived in a homeless shelter  or been in jail or prison?     6. No Have you ever been exposed to anyone with infectious tuberculosis?    Tuberculosis Symptom Questionnaire  Do you currently have any of the following symptoms?  1. No Unexplained cough lasting more than 3 weeks?   2. No Unexplained fever lasting more than 3 weeks.   3. No Night Sweats (sweating that leaves the bedclothes and sheets wet)    4. No Shortness of Breath   5. No Chest Pain   6. No Unintentional weight loss    7. No Unexplained fatigue (very tired for no reason)   Review of Systems  Constitutional: Negative for fever, chills, diaphoresis, activity change, appetite change, fatigue and unexpected weight change.  HENT: Positive for voice change. Negative for congestion, dental problem, drooling, ear discharge, ear pain, facial swelling, hearing loss, mouth sores, nosebleeds, postnasal drip, rhinorrhea, sinus pressure, sneezing, sore throat, tinnitus and trouble swallowing.   Eyes: Negative for photophobia, pain, discharge, redness, itching  and visual disturbance.  Respiratory: Positive for cough. Negative for apnea, choking, chest tightness, shortness of breath, wheezing and stridor.   Cardiovascular: Negative for chest pain, palpitations and leg swelling.  Gastrointestinal: Negative for nausea, vomiting, abdominal pain, diarrhea, constipation and blood in stool.  Endocrine: Negative for cold intolerance, heat intolerance, polydipsia, polyphagia  and polyuria.  Genitourinary: Negative for dysuria, urgency, frequency, hematuria, flank pain, decreased urine volume, discharge, penile swelling, scrotal swelling, enuresis, difficulty urinating, genital sores, penile pain and testicular pain.  Musculoskeletal: Negative for myalgias, back pain, joint swelling, arthralgias, gait problem, neck pain and neck stiffness.  Skin: Negative for color change, pallor, rash and wound.  Allergic/Immunologic: Negative for environmental allergies, food allergies and immunocompromised state.  Neurological: Negative for dizziness, tremors, seizures, syncope, facial asymmetry, speech difficulty, weakness, light-headedness, numbness and headaches.  Hematological: Negative for adenopathy. Does not bruise/bleed easily.  Psychiatric/Behavioral: Negative for suicidal ideas, hallucinations, behavioral problems, confusion, sleep disturbance, self-injury, dysphoric mood, decreased concentration and agitation. The patient is not nervous/anxious and is not hyperactive.     Past Medical History  Diagnosis Date  . Diabetes mellitus without complication   . Cataract   . Allergy   . Hyperlipidemia   . Basal cell carcinoma 01/2013    Nasal and R forearm.  [facility]  . Rosacea    Past Surgical History  Procedure Laterality Date  . Shoulder open rotator cuff repair  1983  . Belpharoptosis repair    . Eyelid surgery    . Basal cell removal      nose and wrist   . Eye surgery      Cataract surgery   No Known Allergies Current Outpatient Prescriptions  Medication Sig Dispense Refill  . aspirin 81 MG tablet Take 81 mg by mouth every other day.     Marland Kitchen glipiZIDE (GLUCOTROL) 5 MG tablet Take 1 tablet (5 mg total) by mouth daily before breakfast. 90 tablet 3  . glucose blood (ACCU-CHEK AVIVA PLUS) test strip Use to test blood sugar daily 100 each 5  . Lancets MISC Use to test blood sugar daily. Dx code: 250.00 100 each 2  . lisinopril (PRINIVIL,ZESTRIL) 5 MG  tablet Take 1 tablet (5 mg total) by mouth daily. 90 tablet 3  . metFORMIN (GLUCOPHAGE) 500 MG tablet Take 1 tablet (500 mg total) by mouth 2 (two) times daily with a meal. 180 tablet 3  . Multiple Vitamin (MULTIVITAMIN) tablet Take 1 tablet by mouth daily.    Marland Kitchen ACCU-CHEK AVIVA PLUS test strip USE TO TEST BLOOD SUGAR DAILY 100 each 3  . pravastatin (PRAVACHOL) 40 MG tablet Take 0.5 tablets (20 mg total) by mouth daily. (Patient not taking: Reported on 08/22/2014) 90 tablet 1   No current facility-administered medications for this visit.       Objective:    BP 124/76 mmHg  Pulse 77  Temp(Src) 98.2 F (36.8 C) (Oral)  Resp 16  Ht 5' 8.5" (1.74 m)  Wt 217 lb 12.8 oz (98.793 kg)  BMI 32.63 kg/m2  SpO2 97% Physical Exam  Constitutional: He is oriented to person, place, and time. He appears well-developed and well-nourished. No distress.  HENT:  Head: Normocephalic and atraumatic.  Right Ear: External ear normal.  Left Ear: External ear normal.  Nose: Nose normal.  Mouth/Throat: Oropharynx is clear and moist.  Eyes: Conjunctivae and EOM are normal. Pupils are equal, round, and reactive to light.  Neck: Normal range of motion. Neck supple. Carotid bruit is not present.  No thyromegaly present.  Cardiovascular: Normal rate, regular rhythm, normal heart sounds and intact distal pulses.  Exam reveals no gallop and no friction rub.   No murmur heard. Pulmonary/Chest: Effort normal and breath sounds normal. He has no wheezes. He has no rales.  Abdominal: Soft. Bowel sounds are normal. He exhibits no distension and no mass. There is no tenderness. There is no rebound and no guarding.  Musculoskeletal:       Right shoulder: Normal.       Left shoulder: Normal.       Cervical back: Normal.  Lymphadenopathy:    He has no cervical adenopathy.  Neurological: He is alert and oriented to person, place, and time. He has normal reflexes. No cranial nerve deficit. He exhibits normal muscle tone.  Coordination normal.  Skin: Skin is warm and dry. No rash noted. He is not diaphoretic.  Psychiatric: He has a normal mood and affect. His behavior is normal. Judgment and thought content normal.   Results for orders placed or performed in visit on 08/22/14  Microalbumin, urine  Result Value Ref Range   Microalb, Ur 0.4 <2.0 mg/dL  POCT urinalysis dipstick  Result Value Ref Range   Color, UA yellow    Clarity, UA clear    Glucose, UA neg    Bilirubin, UA neg    Ketones, UA neg    Spec Grav, UA 1.015    Blood, UA neg    pH, UA 7.0    Protein, UA neg    Urobilinogen, UA 0.2    Nitrite, UA neg    Leukocytes, UA Negative    PREVNAR -13 ADMINISTERED IN OFFICE.    Assessment & Plan:   1. Type 2 diabetes mellitus without complication   2. Encounter for Medicare annual wellness exam   3. Need for prophylactic vaccination against Streptococcus pneumoniae (pneumococcus)   4. Hyperlipidemia   5. Screening for tuberculosis   6. Rosacea   7. Basal cell carcinoma of nose      1. Annual Wellness Examination:  Anticipatory guidance --- weight loss, exercise.  Colonoscopy UTD. Immunizations UTD; s/p Prevnar in office.  Independent with ADLs. Low fall risk.  No evidence of depression or hearing loss.  Has living will; desires FULL CODE; recommend bringing copy to office.   2.  DMII: controlled; recommend weaning Glipizide and increasing Metformin to 1000mg  bid.  Obtain urine microalbumin.  Eye exam UTD. 3.  Hyperlipidemia: intolerant to statins; reviewed recent results; recommend improved LDL level to less than 100.  4.  Screening Tuberculosis: obtain records for 04/2014 PPD results.  Asymptomatic. 5.  Basal cell carcinoma: stable; s/p resection x 2; followed by dermatology. 6. Rosacea: stable; managed by dermatology. 7. Prostate cancer screening: recent PSA normal and unchanged from 2014; pt to return to undergo DRE by male provider.   8. S/p Prevnar 13.   No orders of the defined types  were placed in this encounter.    Return in about 1 month (around 09/21/2014) for prostate check with male provider.    Reginia Forts, M.D.  [facility] [address] [place], [place]  [phone] phone [phone] fax

## 2014-08-23 ENCOUNTER — Encounter: Payer: Self-pay | Admitting: Family Medicine

## 2014-08-23 ENCOUNTER — Other Ambulatory Visit: Payer: Self-pay | Admitting: Physician Assistant

## 2014-08-23 DIAGNOSIS — C44311 Basal cell carcinoma of skin of nose: Secondary | ICD-10-CM | POA: Insufficient documentation

## 2014-08-23 DIAGNOSIS — L719 Rosacea, unspecified: Secondary | ICD-10-CM | POA: Insufficient documentation

## 2014-08-23 LAB — MICROALBUMIN, URINE: Microalb, Ur: 0.4 mg/dL (ref <2.0)

## 2014-08-23 MED ORDER — METFORMIN HCL 500 MG PO TABS: 1000.0000 mg | ORAL_TABLET | Freq: Two times a day (BID) | ORAL | Status: DC

## 2014-08-23 NOTE — Addendum Note (Signed)
Addended by: Wardell Honour on: 08/23/2014 11:30 AM   Modules accepted: Orders

## 2014-08-24 ENCOUNTER — Encounter: Payer: Self-pay | Admitting: Family Medicine

## 2014-08-24 DIAGNOSIS — E119 Type 2 diabetes mellitus without complications: Secondary | ICD-10-CM

## 2014-08-24 MED ORDER — GLUCOSE BLOOD VI STRP: ORAL_STRIP | Status: DC

## 2014-08-24 MED ORDER — LANCETS MISC: Status: DC

## 2014-09-06 ENCOUNTER — Telehealth: Payer: Self-pay | Admitting: Radiology

## 2014-09-06 NOTE — Telephone Encounter (Signed)
I do have his form.  He reports that he underwent TB skin test in 04/2014 at [facility] or [facility]? And we do not have record of this in EPIC or MedMan.  Can we double check with I/A to confirm that we do not have record of Tb skin test results and/or clarify with pt WHERE exactly he underwent Tb skin testing?  At another [facility] facility?  I need copy of TB skin test results.

## 2014-09-06 NOTE — Telephone Encounter (Signed)
Patient indicates he dropped off forms for completion regarding transportation, do you have the forms?

## 2014-09-07 NOTE — Telephone Encounter (Signed)
I have searched through [facility] and EPIC for his results. I called and spoke to Gregory Mathews. He is sure he had it done at [facility] in August. There is no documentation of an encounter or TB skin test being done at [facility] in August. Gregory Mathews even spoke to [facility] himself and they said they could not help him. Gregory Mathews is going to come in to have another TB skin test placed then read so he can get his forms signed.

## 2014-10-17 ENCOUNTER — Ambulatory Visit: Payer: Medicare Other | Admitting: Family Medicine

## 2014-12-01 ENCOUNTER — Other Ambulatory Visit: Payer: Self-pay | Admitting: Emergency Medicine

## 2014-12-01 NOTE — Telephone Encounter (Signed)
Dr Tamala Julian, it looks like pt est care with you for CPE in Nov. I don't see AR or flonase discussed, but we have Rxd it for him in past. Do you want to Rx?

## 2015-03-20 ENCOUNTER — Other Ambulatory Visit: Payer: Self-pay

## 2015-04-15 ENCOUNTER — Ambulatory Visit (INDEPENDENT_AMBULATORY_CARE_PROVIDER_SITE_OTHER): Payer: Medicare Other | Admitting: Emergency Medicine

## 2015-04-15 VITALS — BP 138/80 | HR 63 | Temp 97.7°F | Resp 18 | Ht 68.5 in | Wt 215.4 lb

## 2015-04-15 DIAGNOSIS — L719 Rosacea, unspecified: Secondary | ICD-10-CM | POA: Diagnosis not present

## 2015-04-15 DIAGNOSIS — Z419 Encounter for procedure for purposes other than remedying health state, unspecified: Secondary | ICD-10-CM | POA: Diagnosis not present

## 2015-04-15 DIAGNOSIS — E785 Hyperlipidemia, unspecified: Secondary | ICD-10-CM | POA: Diagnosis not present

## 2015-04-15 DIAGNOSIS — R490 Dysphonia: Secondary | ICD-10-CM | POA: Diagnosis not present

## 2015-04-15 DIAGNOSIS — E119 Type 2 diabetes mellitus without complications: Secondary | ICD-10-CM | POA: Diagnosis not present

## 2015-04-15 DIAGNOSIS — Z79899 Other long term (current) drug therapy: Secondary | ICD-10-CM | POA: Diagnosis not present

## 2015-04-15 DIAGNOSIS — Z7189 Other specified counseling: Secondary | ICD-10-CM | POA: Diagnosis not present

## 2015-04-15 DIAGNOSIS — E109 Type 1 diabetes mellitus without complications: Secondary | ICD-10-CM

## 2015-04-15 LAB — COMPLETE METABOLIC PANEL WITH GFR
ALT: 33 U/L (ref 0–53)
AST: 29 U/L (ref 0–37)
Albumin: 4.5 g/dL (ref 3.5–5.2)
Alkaline Phosphatase: 58 U/L (ref 39–117)
BILIRUBIN TOTAL: 0.7 mg/dL (ref 0.2–1.2)
BUN: 19 mg/dL (ref 6–23)
CHLORIDE: 101 meq/L (ref 96–112)
CO2: 26 mEq/L (ref 19–32)
Calcium: 10.4 mg/dL (ref 8.4–10.5)
Creat: 0.91 mg/dL (ref 0.50–1.35)
GFR, Est African American: >89 mL/min
GFR, Est Non African American: 87 mL/min
Glucose, Bld: 152 mg/dL — ABNORMAL HIGH (ref 70–99)
Potassium: 4.9 mEq/L (ref 3.5–5.3)
SODIUM: 139 meq/L (ref 135–145)
TOTAL PROTEIN: 7.5 g/dL (ref 6.0–8.3)

## 2015-04-15 LAB — LIPID PANEL
CHOL/HDL RATIO: 4.9 ratio
CHOLESTEROL: 180 mg/dL (ref 0–200)
HDL: 37 mg/dL — AB (ref >=40)
LDL Cholesterol: 113 mg/dL — ABNORMAL HIGH (ref 0–99)
Triglycerides: 149 mg/dL (ref <150)
VLDL: 30 mg/dL (ref 0–40)

## 2015-04-15 LAB — POCT CBC
Granulocyte percent: 62.4 %G (ref 37–80)
HCT, POC: 41.8 % — AB (ref 43.5–53.7)
Hemoglobin: 14.2 g/dL (ref 14.1–18.1)
LYMPH, POC: 2.6 (ref 0.6–3.4)
MCH: 30.5 pg (ref 27–31.2)
MCHC: 34 g/dL (ref 31.8–35.4)
MCV: 89.7 fL (ref 80–97)
MID (cbc): 0.7 (ref 0–0.9)
MPV: 7.4 fL (ref 0–99.8)
PLATELET COUNT, POC: 224 10*3/uL (ref 142–424)
POC Granulocyte: 5.6 (ref 2–6.9)
POC LYMPH PERCENT: 29.7 %L (ref 10–50)
POC MID %: 7.9 % (ref 0–12)
RBC: 4.66 M/uL — AB (ref 4.69–6.13)
RDW, POC: 13.4 %
WBC: 8.9 10*3/uL (ref 4.6–10.2)

## 2015-04-15 LAB — GLUCOSE, POCT (MANUAL RESULT ENTRY): POC GLUCOSE: 154 mg/dL — AB (ref 70–99)

## 2015-04-15 LAB — POCT GLYCOSYLATED HEMOGLOBIN (HGB A1C): HEMOGLOBIN A1C: 7.8

## 2015-04-15 MED ORDER — LISINOPRIL 5 MG PO TABS: 5.0000 mg | ORAL_TABLET | Freq: Every day | ORAL | Status: DC

## 2015-04-15 MED ORDER — GLIPIZIDE 5 MG PO TABS: 5.0000 mg | ORAL_TABLET | Freq: Every day | ORAL | Status: DC

## 2015-04-15 MED ORDER — METFORMIN HCL 500 MG PO TABS: ORAL_TABLET | ORAL | Status: DC

## 2015-04-15 NOTE — Progress Notes (Addendum)
Subjective:  This chart was scribed for Arlyss Queen, MD by Leandra Kern, Medical Scribe. This patient was seen in Room 8 and the patient's care was started at 10:14 AM.   Patient ID: Gregory Roller., male    DOB: September 25, 1945, 69 y.o.   MRN: [id number]  Chief Complaint  Patient presents with  . Diabetes    Follow up on his diabetes    HPI HPI Comments: Gregory Ebert Forrester. is a 69 y.o. male with a medical history of DM who presents to [facility] for blood glucose check for his diabetes.  Pt notes that he has been doing well. He reports that he gets his Blood glucose checked everyday, this morning it was within normal limits. Pt denies chest pain, cardiac complications, lower extremity edema.   Pt notes that he gets a wellness check every november by Dr. Tamala Julian.   Pt reports that he gets his eyes checked periodically.     Patient Active Problem List   Diagnosis Date Noted  . Basal cell carcinoma of nose 08/23/2014  . Rosacea 08/23/2014  . Seasonal allergies 01/26/2013  . Type II or unspecified type diabetes mellitus without mention of complication, not stated as uncontrolled 07/09/2012  . Hyperlipidemia 07/09/2012  . Erectile dysfunction 07/09/2012   Past Medical History  Diagnosis Date  . Diabetes mellitus without complication   . Cataract   . Allergy   . Hyperlipidemia   . Basal cell carcinoma 01/2013    Nasal and R forearm.  [facility]  . Rosacea    Past Surgical History  Procedure Laterality Date  . Shoulder open rotator cuff repair  1983  . Belpharoptosis repair    . Eyelid surgery    . Basal cell removal      nose and wrist   . Eye surgery      Cataract surgery   No Known Allergies Prior to Admission medications   Medication Sig Start Date End Date Taking? Authorizing Provider  aspirin 81 MG tablet Take 81 mg by mouth every other day.    Yes Historical Provider, MD  fluticasone (FLONASE) 50 MCG/ACT nasal spray PLACE 2  SPRAYS INTO BOTH NOSTRILS DAILY. 12/01/14  Yes Wardell Honour, MD  glipiZIDE (GLUCOTROL) 5 MG tablet Take 1 tablet (5 mg total) by mouth daily before breakfast. 07/19/14  Yes Orma Flaming, MD  glucose blood (ACCU-CHEK AVIVA PLUS) test strip Use to test blood sugar daily 08/24/14  Yes Chelle Jeffery, PA-C  Lancets MISC Use to test blood sugar daily. Dx code: 250.00 08/24/14  Yes Chelle Jeffery, PA-C  lisinopril (PRINIVIL,ZESTRIL) 5 MG tablet Take 1 tablet (5 mg total) by mouth daily. 07/19/14  Yes Orma Flaming, MD  metFORMIN (GLUCOPHAGE) 500 MG tablet Take 2 tablets (1,000 mg total) by mouth 2 (two) times daily with a meal. 08/23/14  Yes Wardell Honour, MD  Multiple Vitamin (MULTIVITAMIN) tablet Take 1 tablet by mouth daily.   Yes Historical Provider, MD  pravastatin (PRAVACHOL) 40 MG tablet Take 0.5 tablets (20 mg total) by mouth daily. Patient not taking: Reported on 04/15/2015 07/19/14   Orma Flaming, MD   History   Social History  . Marital Status: Married    Spouse Name: [name]  . Number of Children: 4  . Years of Education: 20   Occupational History  . 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 Smoking status: Former Smoker -- 1.00 packs/day  for 15 years    Quit date: 07/09/1982  . Smokeless tobacco: Never Used  . Alcohol Use: No  . Drug Use: No  . Sexual Activity:    Partners: Female   Other Topics Concern  . Not on file   Social History Narrative   Marital status: married x 46 years; happily married.       Children:  4 children; 4 grandchildren. Adult children all live locally.      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      Tobacco:  In past; smoked x 15 years; quit 35 years ago.      Alcohol: none      Exercise: daily; jogging.      ADLs: independent with all ADLs.  Does not use assistant device with ambulation.      Living Will:  Has living will; desires FULL CODE; no  prolonged measures.      Doctoral degree in Biblical Studies.          Review of Systems  Respiratory: Negative for shortness of breath.   Cardiovascular: Negative for chest pain, palpitations and leg swelling.      Objective:   Physical Exam  Constitutional: He is oriented to person, place, and time. He appears well-developed and well-nourished. No distress.  HENT:  Head: Normocephalic and atraumatic.  Eyes: EOM are normal. Pupils are equal, round, and reactive to light.  He has had bilateral cataract surgery  Neck: Neck supple.  No carotid bruits heard  Cardiovascular: Normal rate.   Pulmonary/Chest: Effort normal.  Neurological: He is alert and oriented to person, place, and time. No cranial nerve deficit.  Skin: Skin is warm and dry.  No evidence of peripheral neuropathy normal filament test  Psychiatric: He has a normal mood and affect. His behavior is normal.  Nursing note and vitals reviewed.  BP 142/90 mmHg  Pulse 63  Temp(Src) 97.7 F (36.5 C) (Oral)  Resp 18  Ht 5' 8.5" (1.74 m)  Wt 215 lb 6.4 oz (97.705 kg)  BMI 32.27 kg/m2  SpO2 98% Results for orders placed or performed in visit on 04/15/15  POCT CBC  Result Value Ref Range   WBC 8.9 4.6 - 10.2 K/uL   Lymph, poc 2.6 0.6 - 3.4   POC LYMPH PERCENT 29.7 10 - 50 %L   MID (cbc) 0.7 0 - 0.9   POC MID % 7.9 0 - 12 %M   POC Granulocyte 5.6 2 - 6.9   Granulocyte percent 62.4 37 - 80 %G   RBC 4.66 (A) 4.69 - 6.13 M/uL   Hemoglobin 14.2 14.1 - 18.1 g/dL   HCT, POC 41.8 (A) 43.5 - 53.7 %   MCV 89.7 80 - 97 fL   MCH, POC 30.5 27 - 31.2 pg   MCHC 34.0 31.8 - 35.4 g/dL   RDW, POC 13.4 %   Platelet Count, POC 224 142 - 424 K/uL   MPV 7.4 0 - 99.8 fL  POCT glucose (manual entry)  Result Value Ref Range   POC Glucose 154 (A) 70 - 99 mg/dl  POCT glycosylated hemoglobin (Hb A1C)  Result Value Ref Range   Hemoglobin A1C 7.8       Assessment & Plan:  He does not want to take a full dose of metformin because of  GI upset. He does not want to increase his blood pressure medication. He is agreeable to see  ENT for his chronic hoarseness. Meds were refilled. He will recheck in 3-4 months. He has had a 1.0 rise  in his hemoglobin A1c. He is agreeable to work harder on weight loss exercise and diet.I personally performed the services described in this documentation, which was scribed in my presence. The recorded information has been reviewed and is accurate.  Nena Jordan, MD

## 2015-08-01 ENCOUNTER — Encounter: Payer: Self-pay | Admitting: Physician Assistant

## 2015-08-01 ENCOUNTER — Ambulatory Visit (INDEPENDENT_AMBULATORY_CARE_PROVIDER_SITE_OTHER): Payer: Medicare Other | Admitting: Physician Assistant

## 2015-08-01 VITALS — BP 112/67 | HR 84 | Temp 97.9°F | Resp 18 | Ht 68.5 in | Wt 208.0 lb

## 2015-08-01 DIAGNOSIS — Z889 Allergy status to unspecified drugs, medicaments and biological substances status: Secondary | ICD-10-CM

## 2015-08-01 DIAGNOSIS — Z23 Encounter for immunization: Secondary | ICD-10-CM | POA: Diagnosis not present

## 2015-08-01 DIAGNOSIS — Z760 Encounter for issue of repeat prescription: Secondary | ICD-10-CM | POA: Diagnosis not present

## 2015-08-01 DIAGNOSIS — K5730 Diverticulosis of large intestine without perforation or abscess without bleeding: Secondary | ICD-10-CM | POA: Insufficient documentation

## 2015-08-01 DIAGNOSIS — R079 Chest pain, unspecified: Secondary | ICD-10-CM

## 2015-08-01 DIAGNOSIS — Z0000 Encounter for general adult medical examination without abnormal findings: Secondary | ICD-10-CM | POA: Diagnosis not present

## 2015-08-01 DIAGNOSIS — Z139 Encounter for screening, unspecified: Secondary | ICD-10-CM

## 2015-08-01 DIAGNOSIS — Z789 Other specified health status: Secondary | ICD-10-CM | POA: Insufficient documentation

## 2015-08-01 LAB — CBC WITH DIFFERENTIAL/PLATELET
BASOS PCT: 1 % (ref 0–1)
Basophils Absolute: 0.1 10*3/uL (ref 0.0–0.1)
EOS PCT: 2 % (ref 0–5)
Eosinophils Absolute: 0.2 10*3/uL (ref 0.0–0.7)
HCT: 42.1 % (ref 39.0–52.0)
Hemoglobin: 14.3 g/dL (ref 13.0–17.0)
Lymphocytes Relative: 31 % (ref 12–46)
Lymphs Abs: 2.9 10*3/uL (ref 0.7–4.0)
MCH: 30.8 pg (ref 26.0–34.0)
MCHC: 34 g/dL (ref 30.0–36.0)
MCV: 90.5 fL (ref 78.0–100.0)
MONO ABS: 0.6 10*3/uL (ref 0.1–1.0)
MPV: 10.5 fL (ref 8.6–12.4)
Monocytes Relative: 6 % (ref 3–12)
Neutro Abs: 5.7 10*3/uL (ref 1.7–7.7)
Neutrophils Relative %: 60 % (ref 43–77)
PLATELETS: 237 10*3/uL (ref 150–400)
RBC: 4.65 MIL/uL (ref 4.22–5.81)
RDW: 13.2 % (ref 11.5–15.5)
WBC: 9.5 10*3/uL (ref 4.0–10.5)

## 2015-08-01 LAB — POCT GLYCOSYLATED HEMOGLOBIN (HGB A1C): HEMOGLOBIN A1C: 6.8

## 2015-08-01 LAB — HEMOGLOBIN A1C: Hgb A1c MFr Bld: 6.8 % — AB (ref 4.0–6.0)

## 2015-08-01 MED ORDER — LISINOPRIL 5 MG PO TABS: 5.0000 mg | ORAL_TABLET | Freq: Every day | ORAL | Status: DC

## 2015-08-01 MED ORDER — METFORMIN HCL 500 MG PO TABS: ORAL_TABLET | ORAL | Status: DC

## 2015-08-01 MED ORDER — GLIPIZIDE 5 MG PO TABS: 5.0000 mg | ORAL_TABLET | Freq: Every day | ORAL | Status: DC

## 2015-08-01 NOTE — Progress Notes (Signed)
08/01/2015 at 3:23 PM  Gregory Mathews. / DOB: January 08, 1946 / MRN: [id number]  The patient has Type II or unspecified type diabetes mellitus without mention of complication, not stated as uncontrolled; Hyperlipidemia; Erectile dysfunction; Seasonal allergies; Basal cell carcinoma of nose; Rosacea; and Diverticulosis of colon without hemorrhage on his problem list.  SUBJECTIVE  Gregory Mathews. is a 69 y.o. male who complains of need for annual physical.  Last colonoscopy was in 2015 and showed 3 non malignant polyps, diverticulitis, and internal hemorrhoids.  Patient advised to return in 2020.  PSA checked one year ago at 0.72.  He takes an ASA every other day.  Has never been screened for HIV.   Would like refills of his hypertension and diabetes medication today.   Complains of exertional chest pain that only occurs when is lifting and carrying something heavy. This pain goes away when he is resting.  He exercises daily which includes jogging and does not have any symptoms during this.  Denies nausea, SOB and presyncope with the pain. Reports a family history of heart disease in his father and brother.  He does not smoke.  He is obese. He has been placed on statins in the past and could not tolerate due to myalgia.     Has history of well controlled diabetes and checks sugar daily, which measures anywhere from 95-110.  Takes Metformin and Glipizide daily without fail.  Takes Lisinopril daily for kidney protection and overall risk reduction. Reports one episode of nocturia per night, which has not changed.  Denies sock and glove paresthesia.  Denies polydipsia.  Tries to eat lots of fruit and avoids sodas and bread products.    He  has a past medical history of Diabetes mellitus without complication ([place]); Cataract; Allergy; Hyperlipidemia; Basal cell carcinoma (01/2013); and Rosacea.    Medications reviewed and updated by myself where necessary, and exist elsewhere in the encounter.    Immunization History  Administered Date(s) Administered  . Influenza Split 07/09/2012  . Influenza,inj,Quad PF,36+ Mos 07/20/2013, 07/19/2014  . Pneumococcal Conjugate-13 08/22/2014  . Pneumococcal Polysaccharide-23 07/09/2012   Depression screen [facility] 2/9 04/15/2015 08/22/2014 08/22/2014  Decreased Interest 0 0 0  Down, Depressed, Hopeless 0 0 0  PHQ - 2 Score 0 0 0     Gregory Mathews has No Known Allergies. He  reports that he quit smoking about 33 years ago. He has never used smokeless tobacco. He reports that he does not drink alcohol or use illicit drugs. He  reports that he currently engages in sexual activity and has had male partners. The patient  has past surgical history that includes Shoulder open rotator cuff repair (1983); Blepharoptosis repair; eyelid surgery; basal cell removal; and Eye surgery.  His family history includes Cancer in his brother, maternal grandmother, and mother; Cancer (age of onset: 78) in his father; Colon cancer (age of onset: 31) in his father; Diabetes in his brother, brother, and sister; Heart disease in his brother and father; Stroke in his father, maternal grandfather, and mother.  Review of Systems  Constitutional: Negative for fever and chills.  Respiratory: Negative for shortness of breath.   Cardiovascular: Negative for chest pain.  Gastrointestinal: Negative for nausea and abdominal pain.  Genitourinary: Negative.   Skin: Negative for rash.  Neurological: Negative for dizziness and headaches.    OBJECTIVE  His  height is 5' 8.5" (1.74 m) and weight is 208 lb (94.348 kg). His oral temperature is 97.9 F (36.6 C). His  blood pressure is 112/67 and his pulse is 84. His respiration is 18 and oxygen saturation is 97%.  The patient's body mass index is 31.16 kg/(m^2).  Physical Exam  Vitals reviewed. Constitutional: He is oriented to person, place, and time. He appears well-developed. No distress.  Eyes: EOM are normal. Pupils are equal, round,  and reactive to light. No scleral icterus.  Neck: Normal range of motion.  Cardiovascular: Normal rate and regular rhythm.   Respiratory: Effort normal and breath sounds normal.  GI: He exhibits no distension.  Musculoskeletal: Normal range of motion.  Neurological: He is alert and oriented to person, place, and time. No cranial nerve deficit.  Skin: Skin is warm and dry. No rash noted. He is not diaphoretic.  Psychiatric: He has a normal mood and affect.     02/25/14 Colonoscopy pathlology: Surgical [P], ascending, polyp (2) - SESSILE SERRATED POLYP (X1); NEGATIVE FOR CYTOLOGIC DYSPLASIA. - TUBULAR ADENOMA (X1); NEGATIVE FOR HIGH GRADE DYSPLASIA OR MALIGNANCY.  No results found for this or any previous visit (from the past 24 hour(s)).  ASSESSMENT & PLAN  Gregory Mathews was seen today for annual exam.  Diagnoses and all orders for this visit:  Annual physical exam: Patient doing very well.  Will immunize today for flu and TDAP.  Pneumo series completed.  Advised that he continue exercise daily.  Will send to cardiology given risk factors, however I do not think his chest pain is cardiac in nature.   Medication refill -     lisinopril (PRINIVIL,ZESTRIL) 5 MG tablet; Take 1 tablet (5 mg total) by mouth daily. -     metFORMIN (GLUCOPHAGE) 500 MG tablet; 1 po twice  day -     glipiZIDE (GLUCOTROL) 5 MG tablet; Take 1 tablet (5 mg total) by mouth daily before breakfast.  Screening -     CBC with Differential/Platelet -     TSH -     Microalbumin, urine -     COMPLETE METABOLIC PANEL WITH GFR -     PSA -     HIV antibody -     POCT glycosylated hemoglobin (Hb A1C)  Need for prophylactic vaccination and inoculation against influenza -     Flu Vaccine QUAD 36+ mos IM  Need for Tdap vaccination -     Tdap vaccine greater than or equal to 7yo IM  Exertional chest pain: Patient with some mildly concerning symptoms, however they are not reproduced with walking, stair climbing and jogging.   Will send to Dr. Kingsley Plan for further eval.  Appreciate his recommendations.  -     EKG 12-Lead -     Care order/instruction       -     AMB cardiololgy   The patient was advised to call or come back to clinic if he does not see an improvement in symptoms, or worsens with the above plan.   Philis Fendt, MHS, PA-C [facility] 08/01/2015 3:23 PM

## 2015-08-02 LAB — COMPLETE METABOLIC PANEL WITH GFR
ALK PHOS: 52 U/L (ref 40–115)
ALT: 26 U/L (ref 9–46)
AST: 23 U/L (ref 10–35)
Albumin: 4.3 g/dL (ref 3.6–5.1)
BUN: 19 mg/dL (ref 7–25)
CHLORIDE: 102 mmol/L (ref 98–110)
CO2: 25 mmol/L (ref 20–31)
Calcium: 10.4 mg/dL — ABNORMAL HIGH (ref 8.6–10.3)
Creat: 0.96 mg/dL (ref 0.70–1.25)
GFR, EST NON AFRICAN AMERICAN: 81 mL/min (ref >=60)
GFR, Est African American: >89 mL/min (ref >=60)
GLUCOSE: 94 mg/dL (ref 65–99)
POTASSIUM: 4.7 mmol/L (ref 3.5–5.3)
SODIUM: 139 mmol/L (ref 135–146)
Total Bilirubin: 0.7 mg/dL (ref 0.2–1.2)
Total Protein: 7.6 g/dL (ref 6.1–8.1)

## 2015-08-02 LAB — MICROALBUMIN, URINE: Microalb, Ur: 0.2 mg/dL

## 2015-08-02 LAB — PSA: PSA: 0.76 ng/mL (ref <=4.00)

## 2015-08-02 LAB — TSH: TSH: 1.232 u[IU]/mL (ref 0.350–4.500)

## 2015-08-02 LAB — HIV ANTIBODY (ROUTINE TESTING W REFLEX): HIV 1&2 Ab, 4th Generation: NONREACTIVE

## 2015-08-23 LAB — HM DIABETES EYE EXAM

## 2015-08-24 ENCOUNTER — Encounter: Payer: Self-pay | Admitting: Family Medicine

## 2015-08-24 HISTORY — PX: OTHER SURGICAL HISTORY: SHX169

## 2015-08-25 NOTE — Progress Notes (Signed)
HPI: 69 year old male for evaluation of chest pain. Patient has had occasional chest pain when moving his arms in certain ways. He denies dyspnea on exertion, orthopnea, PND, pedal edema, palpitations, syncope or exertional chest pain.  Current Outpatient Prescriptions  Medication Sig Dispense Refill  . aspirin 81 MG tablet Take 81 mg by mouth every other day.     Marland Kitchen glipiZIDE (GLUCOTROL) 5 MG tablet Take 1 tablet (5 mg total) by mouth daily before breakfast. 90 tablet 3  . glucose blood (ACCU-CHEK AVIVA PLUS) test strip Use to test blood sugar daily 100 each 5  . Lancets MISC Use to test blood sugar daily. Dx code: 250.00 100 each 2  . lisinopril (PRINIVIL,ZESTRIL) 5 MG tablet Take 1 tablet (5 mg total) by mouth daily. 90 tablet 3  . Multiple Vitamin (MULTIVITAMIN) tablet Take 1 tablet by mouth daily.     No current facility-administered medications for this visit.    No Known Allergies   Past Medical History  Diagnosis Date  . Diabetes mellitus without complication ([place])   . Cataract   . Allergy   . Hyperlipidemia   . Basal cell carcinoma 01/2013    Nasal and R forearm.  [facility]  . Rosacea     Past Surgical History  Procedure Laterality Date  . Shoulder open rotator cuff repair  1983  . Belpharoptosis repair    . Eyelid surgery    . Basal cell removal      nose and wrist   . Eye surgery      Cataract surgery    Social History   Social History  . Marital Status: Married    Spouse Name: [name]  . Number of Children: 4  . Years of Education: 20   Occupational History  . 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 Smoking status: Former Smoker -- 1.00 packs/day for 15 years    Quit date: 07/09/1982  . Smokeless tobacco: Never Used  . Alcohol Use: No  . Drug Use: No  . Sexual Activity:    Partners: Female   Other Topics Concern  . Not on file   Social History Narrative   Marital status: married x 23  years; happily married.       Children:  4 children; 4 grandchildren. Adult children all live locally.      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      Tobacco:  In past; smoked x 15 years; quit 35 years ago.      Alcohol: none      Exercise: daily; jogging.      ADLs: independent with all ADLs.  Does not use assistant device with ambulation.      Living Will:  Has living will; desires FULL CODE; no prolonged measures.      Doctoral degree in Biblical Studies.        Family History  Problem Relation Age of Onset  . Cancer Mother     leukemia  . Stroke Mother   . Heart disease Father   . Stroke Father   . Colon cancer Father 52  . Cancer Father 15    Colon cancer  . Diabetes Sister   . Heart disease Brother   . Cancer Maternal Grandmother   . Stroke Maternal Grandfather   . Diabetes Brother   . Diabetes Brother   . Cancer Brother  colon cancer    ROS: no fevers or chills, productive cough, hemoptysis, dysphasia, odynophagia, melena, hematochezia, dysuria, hematuria, rash, seizure activity, orthopnea, PND, pedal edema, claudication. Remaining systems are negative.  Physical Exam:   Blood pressure 150/60, pulse 70, height 5\' 10"  (1.778 m), weight 216 lb 12.8 oz (98.34 kg).  General:  Well developed/well nourished in NAD Skin warm/dry Patient not depressed No peripheral clubbing Back-normal HEENT-normal/normal eyelids Neck supple/normal carotid upstroke bilaterally; no bruits; no JVD; no thyromegaly chest - CTA/ normal expansion CV - RRR/normal S1 and S2; no murmurs, rubs or gallops;  PMI nondisplaced Abdomen -NT/ND, no HSM, no mass, + bowel sounds, positive bruit 2+ femoral pulses, no bruits Ext-no edema, chords, 2+ DP Neuro-grossly nonfocal  ECG 08/01/2015-sinus rhythm with no ST changes.

## 2015-08-29 ENCOUNTER — Encounter: Payer: Self-pay | Admitting: *Deleted

## 2015-08-29 ENCOUNTER — Encounter: Payer: Self-pay | Admitting: Cardiology

## 2015-08-29 ENCOUNTER — Ambulatory Visit (INDEPENDENT_AMBULATORY_CARE_PROVIDER_SITE_OTHER): Payer: Medicare Other | Admitting: Cardiology

## 2015-08-29 VITALS — BP 150/60 | HR 70 | Ht 70.0 in | Wt 216.8 lb

## 2015-08-29 DIAGNOSIS — I10 Essential (primary) hypertension: Secondary | ICD-10-CM

## 2015-08-29 DIAGNOSIS — I152 Hypertension secondary to endocrine disorders: Secondary | ICD-10-CM | POA: Insufficient documentation

## 2015-08-29 DIAGNOSIS — R0989 Other specified symptoms and signs involving the circulatory and respiratory systems: Secondary | ICD-10-CM

## 2015-08-29 DIAGNOSIS — R079 Chest pain, unspecified: Secondary | ICD-10-CM | POA: Diagnosis not present

## 2015-08-29 DIAGNOSIS — E1159 Type 2 diabetes mellitus with other circulatory complications: Secondary | ICD-10-CM | POA: Insufficient documentation

## 2015-08-29 DIAGNOSIS — R0789 Other chest pain: Secondary | ICD-10-CM | POA: Insufficient documentation

## 2015-08-29 DIAGNOSIS — R072 Precordial pain: Secondary | ICD-10-CM

## 2015-08-29 NOTE — Patient Instructions (Signed)
Medication Instructions:   NO CHANGE  Testing/Procedures:  Your physician has requested that you have an exercise tolerance test. For further information please visit [email or web address]. Please also follow instruction sheet, as given.   Your physician has requested that you have an abdominal aorta duplex. During this test, an ultrasound is used to evaluate the aorta. Allow 30 minutes for this exam. Do not eat after midnight the day before and avoid carbonated beverages   Follow-Up: Your physician recommends that you schedule a follow-up appointment in: AS NEEDED   If you need a refill on your cardiac medications before your next appointment, please call your pharmacy.    Exercise Stress Electrocardiogram An exercise stress electrocardiogram is a test to check how blood flows to your heart. It is done to find areas of poor blood flow. You will need to walk on a treadmill for this test. The electrocardiogram will record your heartbeat when you are at rest and when you are exercising. BEFORE THE PROCEDURE  Do not have drinks with caffeine or foods with caffeine for 24 hours before the test, or as told by your doctor. This includes coffee, tea (even decaf tea), sodas, chocolate, and cocoa.  Follow your doctor's instructions about eating and drinking before the test.  Ask your doctor what medicines you should or should not take before the test. Take your medicines with water unless told by your doctor not to.  If you use an inhaler, bring it with you to the test.  Bring a snack to eat after the test.  Do not  smoke for 4 hours before the test.  Do not put lotions, powders, creams, or oils on your chest before the test.  Wear comfortable shoes and clothing. PROCEDURE  You will have patches put on your chest. Small areas of your chest may need to be shaved. Wires will be connected to the patches.  Your heart rate will be watched while you are resting and while you are  exercising.  You will walk on the treadmill. The treadmill will slowly get faster to raise your heart rate.  The test will take about 1-2 hours. AFTER THE PROCEDURE  Your heart rate and blood pressure will be watched after the test.  You may return to your normal diet, activities, and medicines or as told by your doctor.   This information is not intended to replace advice given to you by your health care provider. Make sure you discuss any questions you have with your health care provider.   Document Released: 02/26/2008 Document Revised: 09/30/2014 Document Reviewed: 05/17/2013 Elsevier Interactive Patient Education [employer].

## 2015-08-29 NOTE — Assessment & Plan Note (Signed)
Symptoms are atypical and likely musculoskeletal.However he does have a long history of diabetes mellitus. Will arrange exercise treadmill for risk stratification.

## 2015-08-29 NOTE — Assessment & Plan Note (Signed)
Blood pressure mildly elevated. This can be followed and his lisinopril increased as needed.

## 2015-08-29 NOTE — Assessment & Plan Note (Signed)
Scheduled on ultrasound to exclude aneurysm. 

## 2015-08-30 ENCOUNTER — Ambulatory Visit (HOSPITAL_COMMUNITY)
Admission: RE | Admit: 2015-08-30 | Discharge: 2015-08-30 | Disposition: A | Discharge status: Home / Self Care | Payer: Medicare Other | Source: Ambulatory Visit | Attending: Cardiology | Admitting: Cardiology

## 2015-08-30 ENCOUNTER — Telehealth (HOSPITAL_COMMUNITY): Payer: Self-pay

## 2015-08-30 DIAGNOSIS — R0989 Other specified symptoms and signs involving the circulatory and respiratory systems: Secondary | ICD-10-CM | POA: Diagnosis not present

## 2015-08-30 DIAGNOSIS — E785 Hyperlipidemia, unspecified: Secondary | ICD-10-CM | POA: Insufficient documentation

## 2015-08-30 DIAGNOSIS — E119 Type 2 diabetes mellitus without complications: Secondary | ICD-10-CM | POA: Insufficient documentation

## 2015-08-30 NOTE — Telephone Encounter (Signed)
Encounter complete. 

## 2015-08-31 ENCOUNTER — Ambulatory Visit (HOSPITAL_COMMUNITY)
Admission: RE | Admit: 2015-08-31 | Discharge: 2015-08-31 | Disposition: A | Discharge status: Home / Self Care | Payer: Medicare Other | Source: Ambulatory Visit | Attending: Cardiovascular Disease | Admitting: Cardiovascular Disease

## 2015-08-31 DIAGNOSIS — R079 Chest pain, unspecified: Secondary | ICD-10-CM | POA: Diagnosis not present

## 2015-08-31 LAB — EXERCISE TOLERANCE TEST
CHL CUP RESTING HR STRESS: 69 {beats}/min
CSEPED: 9 min
CSEPEDS: 1 s
CSEPEW: 10.1 METS
CSEPPHR: 144 {beats}/min
MPHR: 153 {beats}/min
Percent HR: 94 %
RPE: 16

## 2015-09-27 ENCOUNTER — Encounter (HOSPITAL_COMMUNITY): Payer: Medicare Other

## 2015-10-06 ENCOUNTER — Other Ambulatory Visit: Payer: Self-pay | Admitting: Physician Assistant

## 2015-10-11 ENCOUNTER — Other Ambulatory Visit: Payer: Self-pay

## 2015-10-11 MED ORDER — BLOOD GLUCOSE METER KIT: PACK | Status: AC

## 2015-10-11 MED ORDER — LANCETS MISC: Status: DC

## 2015-10-11 MED ORDER — GLUCOSE BLOOD VI STRP
ORAL_STRIP | Status: DC
Start: 2015-10-11 — End: 2016-06-23

## 2016-01-31 ENCOUNTER — Ambulatory Visit: Payer: Medicare Other | Admitting: Physician Assistant

## 2016-03-13 ENCOUNTER — Ambulatory Visit (INDEPENDENT_AMBULATORY_CARE_PROVIDER_SITE_OTHER): Payer: Medicare Other | Admitting: Physician Assistant

## 2016-03-13 VITALS — BP 126/78 | HR 62 | Temp 98.1°F | Resp 18 | Ht 70.0 in | Wt 217.0 lb

## 2016-03-13 DIAGNOSIS — H9201 Otalgia, right ear: Secondary | ICD-10-CM

## 2016-03-13 DIAGNOSIS — Z1159 Encounter for screening for other viral diseases: Secondary | ICD-10-CM

## 2016-03-13 DIAGNOSIS — H60391 Other infective otitis externa, right ear: Secondary | ICD-10-CM | POA: Diagnosis not present

## 2016-03-13 DIAGNOSIS — Z889 Allergy status to unspecified drugs, medicaments and biological substances status: Secondary | ICD-10-CM

## 2016-03-13 DIAGNOSIS — E785 Hyperlipidemia, unspecified: Secondary | ICD-10-CM | POA: Diagnosis not present

## 2016-03-13 DIAGNOSIS — E119 Type 2 diabetes mellitus without complications: Secondary | ICD-10-CM | POA: Diagnosis not present

## 2016-03-13 DIAGNOSIS — I10 Essential (primary) hypertension: Secondary | ICD-10-CM | POA: Diagnosis not present

## 2016-03-13 DIAGNOSIS — Z789 Other specified health status: Secondary | ICD-10-CM

## 2016-03-13 DIAGNOSIS — Z6831 Body mass index (BMI) 31.0-31.9, adult: Secondary | ICD-10-CM | POA: Insufficient documentation

## 2016-03-13 LAB — CBC WITH DIFFERENTIAL/PLATELET
BASOS PCT: 1 %
Basophils Absolute: 86 cells/uL (ref 0–200)
EOS ABS: 172 {cells}/uL (ref 15–500)
Eosinophils Relative: 2 %
HEMATOCRIT: 38.6 % (ref 38.5–50.0)
HEMOGLOBIN: 13.3 g/dL (ref 13.2–17.1)
LYMPHS ABS: 2064 {cells}/uL (ref 850–3900)
LYMPHS PCT: 24 %
MCH: 30.5 pg (ref 27.0–33.0)
MCHC: 34.5 g/dL (ref 32.0–36.0)
MCV: 88.5 fL (ref 80.0–100.0)
MONO ABS: 602 {cells}/uL (ref 200–950)
MPV: 10.1 fL (ref 7.5–12.5)
Monocytes Relative: 7 %
NEUTROS ABS: 5676 {cells}/uL (ref 1500–7800)
Neutrophils Relative %: 66 %
Platelets: 232 10*3/uL (ref 140–400)
RBC: 4.36 MIL/uL (ref 4.20–5.80)
RDW: 13.2 % (ref 11.0–15.0)
WBC: 8.6 10*3/uL (ref 3.8–10.8)

## 2016-03-13 LAB — COMPREHENSIVE METABOLIC PANEL
ALBUMIN: 4.4 g/dL (ref 3.6–5.1)
ALT: 25 U/L (ref 9–46)
AST: 20 U/L (ref 10–35)
Alkaline Phosphatase: 59 U/L (ref 40–115)
BUN: 17 mg/dL (ref 7–25)
CHLORIDE: 105 mmol/L (ref 98–110)
CO2: 23 mmol/L (ref 20–31)
CREATININE: 0.96 mg/dL (ref 0.70–1.25)
Calcium: 9.8 mg/dL (ref 8.6–10.3)
Glucose, Bld: 92 mg/dL (ref 65–99)
POTASSIUM: 4.4 mmol/L (ref 3.5–5.3)
SODIUM: 140 mmol/L (ref 135–146)
Total Bilirubin: 0.5 mg/dL (ref 0.2–1.2)
Total Protein: 7.3 g/dL (ref 6.1–8.1)

## 2016-03-13 LAB — TSH: TSH: 2.01 m[IU]/L (ref 0.40–4.50)

## 2016-03-13 LAB — LIPID PANEL
CHOL/HDL RATIO: 5 ratio (ref <=5.0)
Cholesterol: 174 mg/dL (ref 125–200)
HDL: 35 mg/dL — AB (ref >=40)
LDL CALC: 107 mg/dL (ref <130)
TRIGLYCERIDES: 159 mg/dL — AB (ref <150)
VLDL: 32 mg/dL — ABNORMAL HIGH (ref <30)

## 2016-03-13 LAB — HEPATITIS C ANTIBODY: HCV AB: NEGATIVE

## 2016-03-13 LAB — POCT GLYCOSYLATED HEMOGLOBIN (HGB A1C): HEMOGLOBIN A1C: 7.2

## 2016-03-13 LAB — MICROALBUMIN, URINE

## 2016-03-13 MED ORDER — OFLOXACIN 0.3 % OT SOLN: 5.0000 [drp] | Freq: Two times a day (BID) | OTIC | Status: AC

## 2016-03-13 MED ORDER — GLIPIZIDE 5 MG PO TABS: 5.0000 mg | ORAL_TABLET | Freq: Two times a day (BID) | ORAL | Status: DC

## 2016-03-13 NOTE — Progress Notes (Signed)
Patient ID: Gregory Mathews., male    DOB: Sep 12, 1946, 70 y.o.   MRN: [id number]  PCP: Kathlen Brunswick, PA-C  Subjective:   Chief Complaint  Patient presents with  . Ear Problem    Right  . Diabetes    HPI Presents for evaluation of LEFT ear pain x 3 days, and diabetes follow-up.  Ear pain developed after swimming and is getting worse. Dull ache is constant. Pressure sensatio. No better with OTC drops. No associated with nasal/sinus congestion, runny nose, sneezing or cough, sore throat, fever or chills. He does report a history of swimmer's ear that typically resolves without treatment.  Home glucose 99-140. A1C 6.8% in 07/2015 He describes a cyclic nature to his diabetes control, with less control during the winter months. Also higher readings associated with illness (he had a viral URI in May and a GI illness 3 weeks ago). He likes to be able to self adjust his medication, reducing the dose when his sugar is better controlled in the summer months and increasing again in the winter months.   Review of Systems  Constitutional: Negative for activity change, appetite change, fatigue and unexpected weight change.  HENT: Negative for congestion, dental problem, ear pain, hearing loss, mouth sores, postnasal drip, rhinorrhea, sneezing, sore throat, tinnitus and trouble swallowing.   Eyes: Negative for photophobia, pain, redness and visual disturbance.  Respiratory: Negative for cough, chest tightness and shortness of breath.   Cardiovascular: Negative for chest pain, palpitations and leg swelling.  Gastrointestinal: Negative for nausea, vomiting, abdominal pain, diarrhea, constipation and blood in stool.  Endocrine: Negative.   Genitourinary: Positive for frequency (intermittently. Doesn't seem to be associated with blood sugar elevation.). Negative for dysuria, urgency and hematuria.  Musculoskeletal: Negative for myalgias, arthralgias, gait problem and neck stiffness.  Skin:  Negative for rash.  Neurological: Negative for dizziness, speech difficulty, weakness, light-headedness, numbness and headaches.  Hematological: Negative for adenopathy.  Psychiatric/Behavioral: Negative for confusion and sleep disturbance. The patient is not nervous/anxious.        Patient Active Problem List   Diagnosis Date Noted  . Chest pain 08/29/2015  . Bruit 08/29/2015  . Essential hypertension 08/29/2015  . Diverticulosis of colon without hemorrhage 08/01/2015  . Statin intolerance 08/01/2015  . Basal cell carcinoma of nose 08/23/2014  . Rosacea 08/23/2014  . Seasonal allergies 01/26/2013  . Type II or unspecified type diabetes mellitus without mention of complication, not stated as uncontrolled 07/09/2012  . Hyperlipidemia 07/09/2012  . Erectile dysfunction 07/09/2012     Prior to Admission medications   Medication Sig Start Date End Date Taking? Authorizing Provider  aspirin 81 MG tablet Take 81 mg by mouth every other day.    Yes Historical Provider, MD  blood glucose meter kit and supplies Use to test blood sugar daily. Dx: E11.9 10/11/15  Yes Tereasa Coop, PA-C  glipiZIDE (GLUCOTROL) 5 MG tablet Take 1 tablet (5 mg total) by mouth daily before breakfast. 08/01/15  Yes Tereasa Coop, PA-C  glucose blood test strip Test blood sugar daily. Dx: E11.9 10/11/15  Yes Tereasa Coop, PA-C  Lancets MISC Use to test blood sugar daily. Dx code :E11.9 10/11/15  Yes Tereasa Coop, PA-C  lisinopril (PRINIVIL,ZESTRIL) 5 MG tablet Take 1 tablet (5 mg total) by mouth daily. 08/01/15  Yes Tereasa Coop, PA-C  Multiple Vitamin (MULTIVITAMIN) tablet Take 1 tablet by mouth daily.   Yes Historical Provider, MD     No Known Allergies  Objective:  Physical Exam  Constitutional: He is oriented to person, place, and time. He appears well-developed and well-nourished. He is active and cooperative. No distress.  BP 126/78 mmHg  Pulse 62  Temp(Src) 98.1 F (36.7 C) (Oral)   Resp 18  Ht _0  (1.778 m)  Wt 217 lb (98.431 kg)  BMI 31.14 kg/m2  SpO2 97%  HENT:  Head: Normocephalic and atraumatic.  Right Ear: Hearing normal.  Left Ear: Hearing normal.  Both ears with cerumen impaction. Once irrigated, cerumenosis resolved. RIGHT canal with erythema and swelling. TMs are normal.  Eyes: Conjunctivae are normal. No scleral icterus.  Neck: Normal range of motion. Neck supple. No thyromegaly present.  Cardiovascular: Normal rate, regular rhythm and normal heart sounds.   Pulses:      Radial pulses are 2+ on the right side, and 2+ on the left side.  Pulmonary/Chest: Effort normal and breath sounds normal.  Lymphadenopathy:       Head (right side): No tonsillar, no preauricular, no posterior auricular and no occipital adenopathy present.       Head (left side): No tonsillar, no preauricular, no posterior auricular and no occipital adenopathy present.    He has no cervical adenopathy.       Right: No supraclavicular adenopathy present.       Left: No supraclavicular adenopathy present.  Neurological: He is alert and oriented to person, place, and time. No sensory deficit.  Skin: Skin is warm, dry and intact. No rash noted. No cyanosis or erythema. Nails show no clubbing.  Psychiatric: He has a normal mood and affect. His speech is normal and behavior is normal.       Results for orders placed or performed in visit on 03/13/16  POCT glycosylated hemoglobin (Hb A1C)  Result Value Ref Range   Hemoglobin A1C 7.2        Assessment & Plan:   1. Ear pain, right Due to AOE. See below.. - Care order/instruction  2. Type 2 diabetes mellitus without complication, without long-term current use of insulin (HCC) Less control. Increase glipizide to BID. - POCT glycosylated hemoglobin (Hb A1C) - Comprehensive metabolic panel - Microalbumin, urine - HM Diabetes Foot Exam - glipiZIDE (GLUCOTROL) 5 MG tablet; Take 1 tablet (5 mg total) by mouth 2 (two) times daily  before a meal.  Dispense: 180 tablet; Refill: 3  3. Hyperlipidemia Await labs  - Comprehensive metabolic panel - Lipid panel  4. Statin intolerance If additional lipid lowering needed will consider other options.  5. Essential hypertension Controlled. - Comprehensive metabolic panel - CBC with Differential/Platelet - TSH  6. Need for hepatitis C screening test - Hepatitis C antibody  7. BMI 31.0-31.9,adult Healthy eating and regular exercise.  8. Otitis, externa, infective, right - ofloxacin (FLOXIN) 0.3 % otic solution; Place 5 drops into the right ear 2 (two) times daily.  Dispense: 5 mL; Refill: 0   Fara Chute, PA-C Physician Assistant-Certified [facility]

## 2016-03-13 NOTE — Patient Instructions (Signed)
     IF you received an x-ray today, you will receive an invoice from [facility]. Please contact [facility] at [phone] with questions or concerns regarding your invoice.   IF you received labwork today, you will receive an invoice from [facility]. Please contact [facility] at [phone] with questions or concerns regarding your invoice.   Our billing staff will not be able to assist you with questions regarding bills from these companies.  You will be contacted with the lab results as soon as they are available. The fastest way to get your results is to activate your My Chart account. Instructions are located on the last page of this paperwork. If you have not heard from us regarding the results in 2 weeks, please contact this office.      

## 2016-03-13 NOTE — Progress Notes (Signed)
Subjective:    Patient ID: Gregory Mathews., male    DOB: 1945-10-14, 70 y.o.   MRN: [id number] Chief Complaint  Patient presents with  . Ear Problem    Right  . Diabetes   HPI  Patient is 70 yo male who presents today with right inner ear pain x3days.  He states he went swimming 4 days ago and the pain started the following day and has progressivley worsened.  It feels like a constant dull ache inside, like it is swollen, more pressure than pain.  He tried OTC ear drops with no relief.  Denies external pain, fever, dizziness, nausea, vomiting, diarrhea, congestion, facial swelling, recent URI or allergies.  History of otitis externa, which usually clears on its own.  Patient would like to have his A1C checked, because home monitoring has had fluctuating from 99-140.  Lat A1C= 6.8. He states it tends to go up in the winter and believes recent illness have contributed.  Patient has had several illnesses in the last 2 months, a URI in early May, followed by a stomach virus 3 weeks ago.  Followed by a tooth extraction on the left side the following week.    He stopped using Metformin because it cause diarrhea, and want to know if he needs additonal medicaiton.  He would also like to have blood work to check his kidney function. He has had a change in urination, "sometimes I have to go a lot, other times he doesn't, it just seems different and he cant really put a finger on exactly what it is."  Gets regular eye exams, last one 08/2016. Checks his feet daily.  Review of Systems All others negative except those listed in HPI.  Patient Active Problem List   Diagnosis Date Noted  . BMI 31.0-31.9,adult 03/13/2016  . Chest pain 08/29/2015  . Bruit 08/29/2015  . Essential hypertension 08/29/2015  . Diverticulosis of colon without hemorrhage 08/01/2015  . Statin intolerance 08/01/2015  . Basal cell carcinoma of nose 08/23/2014  . Rosacea 08/23/2014  . Seasonal allergies 01/26/2013  .  Diabetes mellitus type 2, uncomplicated ([place]) 09/38/1829  . Hyperlipidemia 07/09/2012  . Erectile dysfunction 07/09/2012     Current Outpatient Prescriptions on File Prior to Visit  Medication Sig Dispense Refill  . aspirin 81 MG tablet Take 81 mg by mouth every other day.     . blood glucose meter kit and supplies Use to test blood sugar daily. Dx: E11.9 1 each 0  . glucose blood test strip Test blood sugar daily. Dx: E11.9 100 each 3  . Lancets MISC Use to test blood sugar daily. Dx code :E11.9 100 each 3  . lisinopril (PRINIVIL,ZESTRIL) 5 MG tablet Take 1 tablet (5 mg total) by mouth daily. 90 tablet 3  . Multiple Vitamin (MULTIVITAMIN) tablet Take 1 tablet by mouth daily.     No current facility-administered medications on file prior to visit.   Allergies  Allergen Reactions  . Metformin And Related Diarrhea       Objective: BP 126/78 mmHg  Pulse 62  Temp(Src) 98.1 F (36.7 C) (Oral)  Resp 18  Ht '5\' 10"'  (1.778 m)  Wt 217 lb (98.431 kg)  BMI 31.14 kg/m2  SpO2 97%   Physical Exam  Constitutional: He is oriented to person, place, and time. He appears well-developed and well-nourished.  HENT:  Right Ear: Hearing and tympanic membrane normal. There is drainage, swelling and tenderness.  Left Ear: Hearing, tympanic membrane and external ear  normal.  Right ear - partially impacted by cereum, following ear lavage, TM pearly grey, landmarks visible.  Left - copious amounts of yellow cereum, TM not visible.  Following ear lavage, erythematous bulge noted in ear canal at the 9 o'clock position.  TM is still not clearly visible due to bulge and edema.  Eyes: Conjunctivae are normal. Pupils are equal, round, and reactive to light.  Neck: Normal range of motion. Neck supple.  Cardiovascular: Normal rate, regular rhythm, normal heart sounds and intact distal pulses.   Pulmonary/Chest: Effort normal and breath sounds normal.  Lymphadenopathy:    He has no cervical adenopathy.    Neurological: He is alert and oriented to person, place, and time.  Skin: Skin is warm and dry.   Diabetic Foot Exam - Simple   Simple Foot Form  Diabetic Foot exam was performed with the following findings:  Yes 03/13/2016 10:33 AM  Visual Inspection  No deformities, no ulcerations, no other skin breakdown bilaterally:  Yes  Sensation Testing  Intact to touch and monofilament testing bilaterally:  Yes  Pulse Check  Posterior Tibialis and Dorsalis pulse intact bilaterally:  Yes  Comments         Assessment & Plan:  1. Ear pain, right 2. Otitis, externa, infective, right Noted improvement in cereum and visualization of TM, with bilateral ear lavage.  Likely due to otitis externa.  Patient provided Floxin drops to be used BID.  Informed him to return if symptoms persist or worsen. - ofloxacin (FLOXIN) 0.3 % otic solution; Place 5 drops into the right ear 2 (two) times daily.  Dispense: 5 mL; Refill: 0  3. Type 2 diabetes mellitus without complication, without long-term current use of insulin (HCC) A1C elevated to 7.2.  Increased patient glipizide to 66m BID.  Continue eating healthy and try to exercise year round. - POCT glycosylated hemoglobin (Hb A1C) - Comprehensive metabolic panel - Microalbumin, urine - [facility] Diabetes Foot Exam - glipiZIDE (GLUCOTROL) 5 MG tablet; Take 1 tablet (5 mg total) by mouth 2 (two) times daily before a meal.  Dispense: 180 tablet; Refill: 3  4. Hyperlipidemia 5. Statin intolerance Labs drawn, results pending. Will adjust management if indicated.  - Comprehensive metabolic panel - Lipid panel  6. Essential hypertension Stable, well controlled on current treatment plan.  Labs drawn, results pending. - Comprehensive metabolic panel - CBC with Differential/Platelet - TSH  7. Need for hepatitis C screening test Labs drawn, results pending - Hepatitis C antibody  8. BMI 31.0-31.9,adult Discussed with patient importance of healthy diet and exercise  in maintaining good health.  Including eating mostly vegetables, healthy grains, minimizing fruits, salts, and carbs.  Exercise 365m/day 5 x week.  Patient instructed to follow up in 2 weeks for re-evaluation of his ear and then return in 6 months for diabetes management.  If concerns or complaints arise inbetween please call or return to clinic.   Gregory Hinderliter P. Sender Rueb, PA-S

## 2016-06-23 ENCOUNTER — Other Ambulatory Visit: Payer: Self-pay | Admitting: Physician Assistant

## 2016-07-10 ENCOUNTER — Telehealth: Payer: Self-pay

## 2016-07-10 ENCOUNTER — Other Ambulatory Visit: Payer: Self-pay | Admitting: Physician Assistant

## 2016-07-10 DIAGNOSIS — Z760 Encounter for issue of repeat prescription: Secondary | ICD-10-CM

## 2016-07-10 NOTE — Telephone Encounter (Signed)
Wants to talk about his glipiZIDE (GLUCOTROL) 5 MG tablet   [phone]

## 2016-07-15 MED ORDER — GLIPIZIDE ER 5 MG PO TB24: 5.0000 mg | ORAL_TABLET | Freq: Every day | ORAL | 3 refills | Status: DC

## 2016-07-15 NOTE — Telephone Encounter (Signed)
When I saw the patient, I refilled what was on his medication list. Looks like he was changed to the IR formulation in 06/2014, most likely because he was taking it PRN rather than daily.  I've changed him back: Meds ordered this encounter  Medications  . glipiZIDE (GLUCOTROL XL) 5 MG 24 hr tablet    Sig: Take 1 tablet (5 mg total) by mouth daily with breakfast.    Dispense:  90 tablet    Refill:  3    Order Specific Question:   Supervising Provider    Answer:   Brigitte Pulse, EVA N [[id number]]    If he finds that his glucose remains uncontrolled, he should RTC for additional evaluation.

## 2016-07-15 NOTE — Telephone Encounter (Signed)
LMTRC jp/cma  

## 2016-07-15 NOTE — Telephone Encounter (Signed)
Gregory Mathews,  I spoke with patient and he states that he has been taking the Glipizide slow release and BS has been fine, now his BS have not been normal like before. Per patient he went back to pharmacy to see what was going on and per the pharmacist he is now on the fast release. I do not see the change in his chart and patient would like new rx for the slow release. Please advise if patient needs to come in for an OV.   Thanks   [employer]

## 2016-08-14 ENCOUNTER — Ambulatory Visit (INDEPENDENT_AMBULATORY_CARE_PROVIDER_SITE_OTHER): Payer: Medicare Other | Admitting: Family Medicine

## 2016-08-14 ENCOUNTER — Encounter: Payer: Self-pay | Admitting: Family Medicine

## 2016-08-14 VITALS — BP 122/60 | HR 68 | Temp 97.5°F | Resp 16 | Ht 68.5 in | Wt 213.8 lb

## 2016-08-14 DIAGNOSIS — I10 Essential (primary) hypertension: Secondary | ICD-10-CM

## 2016-08-14 DIAGNOSIS — L719 Rosacea, unspecified: Secondary | ICD-10-CM | POA: Diagnosis not present

## 2016-08-14 DIAGNOSIS — C44311 Basal cell carcinoma of skin of nose: Secondary | ICD-10-CM | POA: Diagnosis not present

## 2016-08-14 DIAGNOSIS — Z6831 Body mass index (BMI) 31.0-31.9, adult: Secondary | ICD-10-CM

## 2016-08-14 DIAGNOSIS — Z0000 Encounter for general adult medical examination without abnormal findings: Secondary | ICD-10-CM | POA: Diagnosis not present

## 2016-08-14 DIAGNOSIS — E291 Testicular hypofunction: Secondary | ICD-10-CM | POA: Diagnosis not present

## 2016-08-14 DIAGNOSIS — Z23 Encounter for immunization: Secondary | ICD-10-CM

## 2016-08-14 DIAGNOSIS — N5203 Combined arterial insufficiency and corporo-venous occlusive erectile dysfunction: Secondary | ICD-10-CM | POA: Diagnosis not present

## 2016-08-14 DIAGNOSIS — J301 Allergic rhinitis due to pollen: Secondary | ICD-10-CM | POA: Diagnosis not present

## 2016-08-14 DIAGNOSIS — Z125 Encounter for screening for malignant neoplasm of prostate: Secondary | ICD-10-CM

## 2016-08-14 DIAGNOSIS — E119 Type 2 diabetes mellitus without complications: Secondary | ICD-10-CM

## 2016-08-14 DIAGNOSIS — E7800 Pure hypercholesterolemia, unspecified: Secondary | ICD-10-CM | POA: Diagnosis not present

## 2016-08-14 LAB — CBC WITH DIFFERENTIAL/PLATELET
BASOS ABS: 82 {cells}/uL (ref 0–200)
Basophils Relative: 1 %
EOS ABS: 328 {cells}/uL (ref 15–500)
Eosinophils Relative: 4 %
HEMATOCRIT: 40.2 % (ref 38.5–50.0)
HEMOGLOBIN: 13.5 g/dL (ref 13.2–17.1)
LYMPHS ABS: 2050 {cells}/uL (ref 850–3900)
LYMPHS PCT: 25 %
MCH: 30.6 pg (ref 27.0–33.0)
MCHC: 33.6 g/dL (ref 32.0–36.0)
MCV: 91.2 fL (ref 80.0–100.0)
MONO ABS: 574 {cells}/uL (ref 200–950)
MPV: 10.5 fL (ref 7.5–12.5)
Monocytes Relative: 7 %
NEUTROS PCT: 63 %
Neutro Abs: 5166 cells/uL (ref 1500–7800)
Platelets: 226 10*3/uL (ref 140–400)
RBC: 4.41 MIL/uL (ref 4.20–5.80)
RDW: 13.2 % (ref 11.0–15.0)
WBC: 8.2 10*3/uL (ref 3.8–10.8)

## 2016-08-14 LAB — COMPREHENSIVE METABOLIC PANEL
ALBUMIN: 4.6 g/dL (ref 3.6–5.1)
ALK PHOS: 59 U/L (ref 40–115)
ALT: 24 U/L (ref 9–46)
AST: 22 U/L (ref 10–35)
BILIRUBIN TOTAL: 0.7 mg/dL (ref 0.2–1.2)
BUN: 14 mg/dL (ref 7–25)
CALCIUM: 9.8 mg/dL (ref 8.6–10.3)
CO2: 25 mmol/L (ref 20–31)
Chloride: 104 mmol/L (ref 98–110)
Creat: 0.79 mg/dL (ref 0.70–1.25)
GLUCOSE: 113 mg/dL — AB (ref 65–99)
POTASSIUM: 4.4 mmol/L (ref 3.5–5.3)
Sodium: 138 mmol/L (ref 135–146)
Total Protein: 7.8 g/dL (ref 6.1–8.1)

## 2016-08-14 LAB — POCT URINALYSIS DIP (MANUAL ENTRY)
BILIRUBIN UA: NEGATIVE
BILIRUBIN UA: NEGATIVE
GLUCOSE UA: NEGATIVE
Leukocytes, UA: NEGATIVE
Nitrite, UA: NEGATIVE
Protein Ur, POC: NEGATIVE
RBC UA: NEGATIVE
SPEC GRAV UA: 1.01
UROBILINOGEN UA: 0.2
pH, UA: 7

## 2016-08-14 LAB — HEMOGLOBIN A1C
Hgb A1c MFr Bld: 6.6 % — ABNORMAL HIGH (ref <5.7)
MEAN PLASMA GLUCOSE: 143 mg/dL

## 2016-08-14 LAB — TSH: TSH: 1.08 m[IU]/L (ref 0.40–4.50)

## 2016-08-14 LAB — LIPID PANEL
CHOL/HDL RATIO: 4.7 ratio (ref <5.0)
Cholesterol: 174 mg/dL (ref <200)
HDL: 37 mg/dL — AB (ref >40)
LDL Cholesterol: 113 mg/dL — ABNORMAL HIGH (ref <100)
TRIGLYCERIDES: 120 mg/dL (ref <150)
VLDL: 24 mg/dL (ref <30)

## 2016-08-14 LAB — PSA: PSA: 0.7 ng/mL (ref <=4.0)

## 2016-08-14 LAB — TESTOSTERONE: Testosterone: 209 ng/dL — ABNORMAL LOW (ref 250–827)

## 2016-08-14 LAB — MICROALBUMIN, URINE: Microalb, Ur: <0.2 mg/dL

## 2016-08-14 MED ORDER — MELOXICAM 15 MG PO TABS: 15.0000 mg | ORAL_TABLET | Freq: Every day | ORAL | 0 refills | Status: DC

## 2016-08-14 MED ORDER — LOSARTAN POTASSIUM 25 MG PO TABS: 25.0000 mg | ORAL_TABLET | Freq: Every day | ORAL | 1 refills | Status: DC

## 2016-08-14 MED ORDER — GLIPIZIDE ER 5 MG PO TB24: 5.0000 mg | ORAL_TABLET | Freq: Every day | ORAL | 3 refills | Status: DC

## 2016-08-14 NOTE — Patient Instructions (Addendum)
IF you received an x-ray today, you will receive an invoice from [facility]. Please contact [facility] at [phone] with questions or concerns regarding your invoice.   IF you received labwork today, you will receive an invoice from [employer]. Please contact [facility] at [phone] with questions or concerns regarding your invoice.   Our billing staff will not be able to assist you with questions regarding bills from these companies.  You will be contacted with the lab results as soon as they are available. The fastest way to get your results is to activate your My Chart account. Instructions are located on the last page of this paperwork. If you have not heard from [place] regarding the results in 2 weeks, please contact this office.     Trochanteric Bursitis Rehab Ask your health care provider which exercises are safe for you. Do exercises exactly as told by your health care provider and adjust them as directed. It is normal to feel mild stretching, pulling, tightness, or discomfort as you do these exercises, but you should stop right away if you feel sudden pain or your pain gets worse.Do not begin these exercises until told by your health care provider. Stretching exercises These exercises warm up your muscles and joints and improve the movement and flexibility of your hip. These exercises also help to relieve pain and stiffness. Exercise A: Iliotibial band stretch 1. Lie on your side with your left / right leg in the top position. 2. Bend your left / right knee and grab your ankle. 3. Slowly bring your knee back so your thigh is behind your body. 4. Slowly lower your knee toward the floor until you feel a gentle stretch on the outside of your left / right thigh. If you do not feel a stretch and your knee will not fall farther, place the heel of your other foot on top of your outer knee and pull your thigh down farther. 5. Hold this  position for __________ seconds. 6. Slowly return to the starting position. Repeat __________ times. Complete this exercise __________ times a day. Strengthening exercises These exercises build strength and endurance in your hip and pelvis. Endurance is the ability to use your muscles for a long time, even after they get tired. Exercise B: Bridge (hip extensors) 1. Lie on your back on a firm surface with your knees bent and your feet flat on the floor. 2. Tighten your buttocks muscles and lift your buttocks off the floor until your trunk is level with your thighs. You should feel the muscles working in your buttocks and the back of your thighs. If this exercise is too easy, try doing it with your arms crossed over your chest. 3. Hold this position for __________ seconds. 4. Slowly return to the starting position. 5. Let your muscles relax completely between repetitions. Repeat __________ times. Complete this exercise __________ times a day. Exercise C: Squats (knee extensors and  quadriceps) 1. Stand in front of a table, with your feet and knees pointing straight ahead. You may rest your hands on the table for balance but not for support. 2. Slowly bend your knees and lower your hips like you are going to sit in a chair.  Keep your weight over your heels, not over your toes.  Keep your lower legs upright so they are parallel with the table legs.  Do not let your hips go lower than your knees.  Do not bend lower than told by  your health care provider.  If your hip pain increases, do not bend as low. 3. Hold this position for __________ seconds. 4. Slowly push with your legs to return to standing. Do not use your hands to pull yourself to standing. Repeat __________ times. Complete this exercise __________ times a day. Exercise D: Hip hike 1. Stand sideways on a bottom step. Stand on your left / right leg with your other foot unsupported next to the step. You can hold onto the railing or  wall if needed for balance. 2. Keeping your knees straight and your torso square, lift your left / right hip up toward the ceiling. 3. Hold this position for __________ seconds. 4. Slowly let your left / right hip lower toward the floor, past the starting position. Your foot should get closer to the floor. Do not lean or bend your knees. Repeat __________ times. Complete this exercise __________ times a day. Exercise E: Single leg stand 1. Stand near a counter or door frame that you can hold onto for balance as needed. It is helpful to stand in front of a mirror for this exercise so you can watch your hip. 2. Squeeze your left / right buttock muscles then lift up your other foot. Do not let your left / right hip push out to the side. 3. Hold this position for __________ seconds. Repeat __________ times. Complete this exercise __________ times a day. This information is not intended to replace advice given to you by your health care provider. Make sure you discuss any questions you have with your health care provider. Document Released: 10/17/2004 Document Revised: 05/16/2016 Document Reviewed: 08/25/2015 Elsevier Interactive Patient Education  2017 [employer].

## 2016-08-14 NOTE — Progress Notes (Signed)
Subjective:    Patient ID: Gregory Roller., male    DOB: 1946/07/05, 70 y.o.   MRN: [id number]  08/14/2016  Annual Exam and Medication Refill (LISINOPRIL and GLIPIZIDE)   HPI This 70 y.o. male presents for Annual Wellness Examination.  Last physical: 08-01-2015 Colonoscopy:2015 Eye exam: scheduled 08/27/2016 [facility] exam:  Recent change; followed every six months.  Immunization History  Administered Date(s) Administered  . Influenza Split 07/09/2012  . Influenza,inj,Quad PF,36+ Mos 07/20/2013, 07/19/2014, 08/01/2015  . Pneumococcal Conjugate-13 08/22/2014  . Pneumococcal Polysaccharide-23 07/09/2012  . Tdap 08/01/2015  . Zoster 09/23/2005   Wt Readings from Last 3 Encounters:  08/14/16 213 lb 12.8 oz (97 kg)  03/13/16 217 lb (98.4 kg)  08/29/15 216 lb 12.8 oz (98.3 kg)   BP Readings from Last 3 Encounters:  08/14/16 122/60  03/13/16 126/78  08/29/15 (!) 150/60   DMII: Patient reports good compliance with medication, good tolerance to medication, and good symptom control.  Checking sugars; sugars recently elevated; three months ago, L hip started hurting.  Sleeps on side. Duration of L hip pain one week.One week duration.  Slept in different bedroom; then B hips started hurting again two weeks later.  Then started sleeping in recliner which prevents pt from sleeping on hips.  Radiated down to knees.  No pain during the day.  Now feeling deterioration in leg strength. Still sleeping in recliner.  Sleeping seven hours of sleep per night. No back problems in the past. Taking Ibuprofen for hip pain.  Did not take medication during the day.  When takes medication, sugars go up. When hip pain resolved, sugars improved.   Review of Systems  Constitutional: Negative for activity change, appetite change, chills, diaphoresis, fatigue, fever and unexpected weight change.  HENT: Negative for congestion, dental problem, drooling, ear discharge, ear pain, facial swelling, hearing  loss, mouth sores, nosebleeds, postnasal drip, rhinorrhea, sinus pressure, sneezing, sore throat, tinnitus, trouble swallowing and voice change.   Eyes: Negative for photophobia, pain, discharge, redness, itching and visual disturbance.  Respiratory: Positive for cough. Negative for apnea, choking, chest tightness, shortness of breath, wheezing and stridor.   Cardiovascular: Negative for chest pain, palpitations and leg swelling.  Gastrointestinal: Negative for abdominal pain, blood in stool, constipation, diarrhea, nausea and vomiting.  Endocrine: Negative for cold intolerance, heat intolerance, polydipsia, polyphagia and polyuria.  Genitourinary: Negative for decreased urine volume, difficulty urinating, discharge, dysuria, enuresis, flank pain, frequency, genital sores, hematuria, penile pain, penile swelling, scrotal swelling, testicular pain and urgency.       Nocturia x depends on fluid intake; 0-1. Urinary stream slightly weaker.    Musculoskeletal: Positive for arthralgias. Negative for back pain, gait problem, joint swelling, myalgias, neck pain and neck stiffness.  Skin: Negative for color change, pallor, rash and wound.  Allergic/Immunologic: Negative for environmental allergies, food allergies and immunocompromised state.  Neurological: Negative for dizziness, tremors, seizures, syncope, facial asymmetry, speech difficulty, weakness, light-headedness, numbness and headaches.  Hematological: Negative for adenopathy. Does not bruise/bleed easily.  Psychiatric/Behavioral: Negative for agitation, behavioral problems, confusion, decreased concentration, dysphoric mood, hallucinations, self-injury, sleep disturbance and suicidal ideas. The patient is not nervous/anxious and is not hyperactive.     Past Medical History:  Diagnosis Date  . Allergy   . Basal cell carcinoma 01/2013   Nasal and R forearm.  [facility]  . Cataract   . Diabetes mellitus without complication ([place])   .  Hyperlipidemia   . Rosacea    Past Surgical History:  Procedure Laterality Date  . basal cell removal     nose and wrist   . BELPHAROPTOSIS REPAIR    . EYE SURGERY     Cataract surgery  . eyelid surgery    . SHOULDER OPEN ROTATOR CUFF REPAIR  1983   Allergies  Allergen Reactions  . Metformin And Related Diarrhea    Social History   Social History  . Marital status: Married    Spouse name: Gregory Mathews  . Number of children: 4  . Years of education: 20   Occupational History  . bus driver/transportation [place] History Main Topics  . Smoking status: Former Smoker    Packs/day: 1.00    Years: 15.00    Quit date: 07/09/1982  . Smokeless tobacco: Never Used  . Alcohol use No  . Drug use: No  . Sexual activity: Yes    Partners: Female   Other Topics Concern  . Not on file   Social History Narrative   Marital status: married x 34 years; happily married.       Children:  4 children; 4 grandchildren. Adult children all live locally.      Lives: with wife      Employment: retired from bus transportation; PRN driving now activity bus. [employer].  Chicken farming.      Tobacco:  In past; smoked x 15 years; quit 35 years ago.      Alcohol: none      Exercise:sporadic in 2017.      ADLs: independent with all ADLs.  Does not use assistant device with ambulation.      Living Will:  Has living will; desires FULL CODE; no prolonged measures.      Doctoral degree in Biblical Studies.       Family History  Problem Relation Age of Onset  . Cancer Mother     leukemia  . Stroke Mother   . Heart disease Father   . Stroke Father   . Colon cancer Father 64  . Cancer Father 52    Colon cancer  . Diabetes Sister   . Heart disease Brother   . Cancer Maternal Grandmother   . Stroke Maternal Grandfather   . Diabetes Brother   . Diabetes Brother   . Cancer Brother     colon cancer       Objective:    BP 122/60 (BP Location: Left Arm, Patient  Position: Sitting, Cuff Size: Large)   Pulse 68   Temp 97.5 F (36.4 C) (Oral)   Resp 16   Ht 5' 8.5" (1.74 m)   Wt 213 lb 12.8 oz (97 kg)   SpO2 95%   BMI 32.04 kg/m  Physical Exam  Constitutional: He is oriented to person, place, and time. He appears well-developed and well-nourished. No distress.  HENT:  Head: Normocephalic and atraumatic.  Right Ear: External ear normal.  Left Ear: External ear normal.  Nose: Nose normal.  Mouth/Throat: Oropharynx is clear and moist.  Eyes: Conjunctivae and EOM are normal. Pupils are equal, round, and reactive to light.  Neck: Normal range of motion. Neck supple. Carotid bruit is not present. No thyromegaly present.  Cardiovascular: Normal rate, regular rhythm, normal heart sounds and intact distal pulses.  Exam reveals no gallop and no friction rub.   No murmur heard. Pulmonary/Chest: Effort normal and breath sounds normal. He has no wheezes. He has no rales.  Abdominal: Soft. Bowel sounds are normal. He exhibits no distension and  no mass. There is no tenderness. There is no rebound and no guarding.  Musculoskeletal:       Right shoulder: Normal.       Left shoulder: Normal.       Cervical back: Normal.  Lymphadenopathy:    He has no cervical adenopathy.  Neurological: He is alert and oriented to person, place, and time. He has normal reflexes. No cranial nerve deficit. He exhibits normal muscle tone. Coordination normal.  Skin: Skin is warm and dry. No rash noted. He is not diaphoretic.  Psychiatric: He has a normal mood and affect. His behavior is normal. Judgment and thought content normal.   Results for orders placed or performed in visit on 03/13/16  Comprehensive metabolic panel  Result Value Ref Range   Sodium 140 135 - 146 mmol/L   Potassium 4.4 3.5 - 5.3 mmol/L   Chloride 105 98 - 110 mmol/L   CO2 23 20 - 31 mmol/L   Glucose, Bld 92 65 - 99 mg/dL   BUN 17 7 - 25 mg/dL   Creat 0.96 0.70 - 1.25 mg/dL   Total Bilirubin 0.5 0.2 -  1.2 mg/dL   Alkaline Phosphatase 59 40 - 115 U/L   AST 20 10 - 35 U/L   ALT 25 9 - 46 U/L   Total Protein 7.3 6.1 - 8.1 g/dL   Albumin 4.4 3.6 - 5.1 g/dL   Calcium 9.8 8.6 - 10.3 mg/dL  CBC with Differential/Platelet  Result Value Ref Range   WBC 8.6 3.8 - 10.8 K/uL   RBC 4.36 4.20 - 5.80 MIL/uL   Hemoglobin 13.3 13.2 - 17.1 g/dL   HCT 38.6 38.5 - 50.0 %   MCV 88.5 80.0 - 100.0 fL   MCH 30.5 27.0 - 33.0 pg   MCHC 34.5 32.0 - 36.0 g/dL   RDW 13.2 11.0 - 15.0 %   Platelets 232 140 - 400 K/uL   MPV 10.1 7.5 - 12.5 fL   Neutro Abs 5,676 1,500 - 7,800 cells/uL   Lymphs Abs 2,064 850 - 3,900 cells/uL   Monocytes Absolute 602 200 - 950 cells/uL   Eosinophils Absolute 172 15 - 500 cells/uL   Basophils Absolute 86 0 - 200 cells/uL   Neutrophils Relative % 66 %   Lymphocytes Relative 24 %   Monocytes Relative 7 %   Eosinophils Relative 2 %   Basophils Relative 1 %   Smear Review Criteria for review not met   Lipid panel  Result Value Ref Range   Cholesterol 174 125 - 200 mg/dL   Triglycerides 159 (H) <150 mg/dL   HDL 35 (L) >=40 mg/dL   Total CHOL/HDL Ratio 5.0 <=5.0 Ratio   VLDL 32 (H) <30 mg/dL   LDL Cholesterol 107 <130 mg/dL  Hepatitis C antibody  Result Value Ref Range   HCV Ab NEGATIVE NEGATIVE  TSH  Result Value Ref Range   TSH 2.01 0.40 - 4.50 mIU/L  Microalbumin, urine  Result Value Ref Range   Microalb, Ur <0.2 Not estab mg/dL  POCT glycosylated hemoglobin (Hb A1C)  Result Value Ref Range   Hemoglobin A1C 7.2    Fall Risk  08/14/2016 03/13/2016 08/22/2014 08/22/2014  Falls in the past year? No No No No   Depression screen [facility] 2/9 08/14/2016 03/13/2016 08/01/2015 04/15/2015 08/22/2014  Decreased Interest 0 0 0 0 0  Down, Depressed, Hopeless 0 0 0 0 0  PHQ - 2 Score 0 0 0 0 0   Functional Status  Survey: Is the patient deaf or have difficulty hearing?: No Does the patient have difficulty seeing, even when wearing glasses/contacts?: No Does the patient have  difficulty concentrating, remembering, or making decisions?: No Does the patient have difficulty walking or climbing stairs?: No Does the patient have difficulty dressing or bathing?: No Does the patient have difficulty doing errands alone such as visiting a doctor's office or shopping?: No     Assessment & Plan:   1. Encounter for Medicare annual wellness exam   2. Type 2 diabetes mellitus without complication, without long-term current use of insulin ([place])   3. Essential hypertension   4. Chronic seasonal allergic rhinitis due to pollen   5. Basal cell carcinoma of nose   6. Rosacea   7. Combined arterial insufficiency and corporo-venous occlusive erectile dysfunction   8. Pure hypercholesterolemia   9. BMI 31.0-31.9,adult   10. Screening for prostate cancer   11. Hypogonadism in male    -anticipatory guidance --- exercise, weight loss. -colonoscopy UTD -immunizations UTD. -obtain labs; refills provided. -due to chronic cough, change Lisinopril to Losartan 48m daily. -acute greater trochanteric bursitis L>R; rx for Meloxicam provided; home exercise program provided; if no improvement in one month, refer to ortho. -independent with ADLs; no evidence of hearing loss or depression; has living will/advanced directives.  Orders Placed This Encounter  Procedures  . CBC with Differential/Platelet  . Comprehensive metabolic panel    Order Specific Question:   Has the patient fasted?    Answer:   Yes  . Lipid panel    Order Specific Question:   Has the patient fasted?    Answer:   Yes  . TSH  . Microalbumin, urine  . Hemoglobin A1c  . PSA  . Testosterone  . POCT urinalysis dipstick   Meds ordered this encounter  Medications  . losartan (COZAAR) 25 MG tablet    Sig: Take 1 tablet (25 mg total) by mouth daily.    Dispense:  90 tablet    Refill:  1  . glipiZIDE (GLUCOTROL XL) 5 MG 24 hr tablet    Sig: Take 1 tablet (5 mg total) by mouth daily with breakfast.    Dispense:   90 tablet    Refill:  3  . meloxicam (MOBIC) 15 MG tablet    Sig: Take 1 tablet (15 mg total) by mouth daily.    Dispense:  30 tablet    Refill:  0    Return in about 3 months (around 11/14/2016) for recheck diabetes, blood pressure, hip bursitis..Norwood Levo M.D. [facility]GP[address] [place]  [phone] [phone]

## 2016-08-30 ENCOUNTER — Ambulatory Visit (INDEPENDENT_AMBULATORY_CARE_PROVIDER_SITE_OTHER): Payer: Medicare Other | Admitting: Physician Assistant

## 2016-08-30 ENCOUNTER — Telehealth: Payer: Self-pay | Admitting: Family Medicine

## 2016-08-30 ENCOUNTER — Ambulatory Visit (INDEPENDENT_AMBULATORY_CARE_PROVIDER_SITE_OTHER): Payer: Medicare Other

## 2016-08-30 VITALS — BP 124/62 | HR 80 | Temp 97.8°F | Resp 16 | Ht 69.0 in | Wt 213.0 lb

## 2016-08-30 DIAGNOSIS — R05 Cough: Secondary | ICD-10-CM | POA: Diagnosis not present

## 2016-08-30 DIAGNOSIS — R059 Cough, unspecified: Secondary | ICD-10-CM

## 2016-08-30 MED ORDER — AZITHROMYCIN 250 MG PO TABS: ORAL_TABLET | ORAL | 0 refills | Status: DC

## 2016-08-30 MED ORDER — ALBUTEROL SULFATE HFA 108 (90 BASE) MCG/ACT IN AERS: 2.0000 | INHALATION_SPRAY | RESPIRATORY_TRACT | 1 refills | Status: DC | PRN

## 2016-08-30 MED ORDER — BENZONATATE 100 MG PO CAPS: 100.0000 mg | ORAL_CAPSULE | Freq: Three times a day (TID) | ORAL | 0 refills | Status: DC | PRN

## 2016-08-30 NOTE — Telephone Encounter (Signed)
Patient calling us to let know that ever since he stopped taking this lisinopril on Nov 22 and started on Cozaar he still have a real bad cough he hasn't been able to sleep because of cough and his ribs hurt from coughing

## 2016-08-30 NOTE — Progress Notes (Signed)
Patient ID: Gregory Roller., male    DOB: 1945/09/26, 70 y.o.   MRN: [id number]  PCP: Kathlen Brunswick, PA-C  Chief Complaint  Patient presents with  . Cough    x 7 weeks    Subjective:   Presents for evaluation of persistent cough.  At his annual wellness visit 08/14/2016 he reported dry, nagging cough for about 2 weeks. Exam was unremarkable, and lisinopril was considered possible cause. Lisinopril was stopped and he was started on losartan 25 mg in it's place.  Has had no improvement in the cough. Cough is causing chest wall soreness and keeping him awake at night. He also notes some sputum production with cough, though mostly he feels that there is phlegm stuck and that he can't clear it. No SOB, dizziness, nausea, vomiting. No fever/chills. No indigestion, heartburn, abdominal pain.  He has not received results from his wellness visit labs and asks that we review them today.  Low testosterone is known to be low. Previously recommended testosterone replacement, but he was concerned about the risks.  Takes a fish oil product, thinks it's 400 units.   Review of Systems As above.    Patient Active Problem List   Diagnosis Date Noted  . 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 BMI 31.0-31.9,adult 03/13/2016  . Chest pain 08/29/2015  . Bruit 08/29/2015  . Essential hypertension 08/29/2015  . Diverticulosis of colon without hemorrhage 08/01/2015  . Statin intolerance 08/01/2015  . Basal cell carcinoma of nose 08/23/2014  . Rosacea 08/23/2014  . Seasonal allergies 01/26/2013  . Diabetes mellitus type 2, uncomplicated ([place]) 15/94/5859  . Hyperlipidemia 07/09/2012  . Erectile dysfunction 07/09/2012     Prior to Admission medications   Medication Sig Start Date End Date Taking? Authorizing Provider  aspirin 81 MG tablet Take 81 mg by mouth every other day.    Yes Historical Provider, MD  blood glucose meter kit and supplies Use to test blood sugar daily. Dx: E11.9  10/11/15  Yes Tereasa Coop, PA-C  glipiZIDE (GLUCOTROL XL) 5 MG 24 hr tablet Take 1 tablet (5 mg total) by mouth daily with breakfast. 08/14/16  Yes Wardell Honour, MD  Lancets MISC Use to test blood sugar daily. Dx code :E11.9 10/11/15  Yes Tereasa Coop, PA-C  lisinopril (PRINIVIL,ZESTRIL) 5 MG tablet TAKE 1 TABLET (5 MG TOTAL) BY MOUTH DAILY. 07/10/16  Yes Tereasa Coop, PA-C  losartan (COZAAR) 25 MG tablet Take 1 tablet (25 mg total) by mouth daily. 08/14/16  Yes Wardell Honour, MD  meloxicam (MOBIC) 15 MG tablet Take 1 tablet (15 mg total) by mouth daily. 08/14/16  Yes Wardell Honour, MD  Multiple Vitamin (MULTIVITAMIN) tablet Take 1 tablet by mouth daily.   Yes Historical Provider, MD  ONE TOUCH ULTRA TEST test strip TEST BLOOD SUGAR DAILY 06/25/16  Yes Tereasa Coop, PA-C     Allergies  Allergen Reactions  . Metformin And Related Diarrhea       Objective:  Physical Exam  Constitutional: He is oriented to person, place, and time. He appears well-developed and well-nourished. He is active and cooperative. No distress.  BP 124/62 (BP Location: Right Arm, Patient Position: Sitting, Cuff Size: Normal)   Pulse 80   Temp 97.8 F (36.6 C)   Resp 16   Ht '5\' 9"'  (1.753 m)   Wt 213 lb (96.6 kg)   SpO2 97%   BMI 31.45 kg/m   HENT:  Head: Normocephalic and atraumatic.  Right  Ear: Hearing normal.  Left Ear: Hearing normal.  Eyes: Conjunctivae are normal. No scleral icterus.  Neck: Normal range of motion. Neck supple. No thyromegaly present.  Cardiovascular: Normal rate, regular rhythm and normal heart sounds.   Pulses:      Radial pulses are 2+ on the right side, and 2+ on the left side.  Pulmonary/Chest: Effort normal. He has no decreased breath sounds. He has no wheezes. He has rhonchi in the left lower field. He has no rales.  Lymphadenopathy:       Head (right side): No tonsillar, no preauricular, no posterior auricular and no occipital adenopathy present.       Head (left  side): No tonsillar, no preauricular, no posterior auricular and no occipital adenopathy present.    He has no cervical adenopathy.       Right: No supraclavicular adenopathy present.       Left: No supraclavicular adenopathy present.  Neurological: He is alert and oriented to person, place, and time. No sensory deficit.  Skin: Skin is warm, dry and intact. No rash noted. No cyanosis or erythema. Nails show no clubbing.  Psychiatric: He has a normal mood and affect. His speech is normal and behavior is normal.       Dg Chest 2 View  Result Date: 08/30/2016 CLINICAL DATA:  Cough x7 weeks EXAM: CHEST  2 VIEW COMPARISON:  None. FINDINGS: The heart size and mediastinal contours are within normal limits. Mild interstitial prominence is noted with slight peribronchial thickening suggesting bronchitic change. The visualized skeletal structures are unremarkable. IMPRESSION: Mild interstitial prominence with slight peribronchial thickening suggesting bronchitic change. No pneumonic consolidation or effusion. Electronically Signed   By: Ashley Royalty M.D.   On: 08/30/2016 14:27       Assessment & Plan:   1. Cough No improvement with D/C ACEI. Treat for possible infectious process. If no improvement, RTC for re-evaluation with PCP. - DG Chest 2 View; Future - albuterol (PROVENTIL HFA;VENTOLIN HFA) 108 (90 Base) MCG/ACT inhaler; Inhale 2 puffs into the lungs every 4 (four) hours as needed for wheezing or shortness of breath (cough, shortness of breath or wheezing.).  Dispense: 1 Inhaler; Refill: 1 - benzonatate (TESSALON) 100 MG capsule; Take 1-2 capsules (100-200 mg total) by mouth 3 (three) times daily as needed for cough.  Dispense: 40 capsule; Refill: 0 - azithromycin (ZITHROMAX) 250 MG tablet; Take 2 tabs PO x 1 dose, then 1 tab PO QD x 4 days  Dispense: 6 tablet; Refill: 0   Fara Chute, PA-C Physician Assistant-Certified [facility]

## 2016-08-30 NOTE — Patient Instructions (Addendum)
Get plenty of rest and drink at least 64 ounces of water daily.     IF you received an x-ray today, you will receive an invoice from [facility]. Please contact [facility] at [phone] with questions or concerns regarding your invoice.   IF you received labwork today, you will receive an invoice from [facility]. Please contact [facility] at [phone] with questions or concerns regarding your invoice.   Our billing staff will not be able to assist you with questions regarding bills from these companies.  You will be contacted with the lab results as soon as they are available. The fastest way to get your results is to activate your My Chart account. Instructions are located on the last page of this paperwork. If you have not heard from us regarding the results in 2 weeks, please contact this office.     

## 2016-08-31 ENCOUNTER — Telehealth: Payer: Self-pay | Admitting: Emergency Medicine

## 2016-08-31 NOTE — Telephone Encounter (Signed)
Pt states his ribs are sore from constant coughing with elevated fever Pt was seen yesterday, negative chest xray and treated with Z-pak and Tessalon. Advised to allow medication to work. If no improvement by tomorrow or worsening pain,sob,fever go to ER or return to clinic on Monday.  Pt hung the phone up on me.

## 2016-08-31 NOTE — Telephone Encounter (Signed)
Pt states that he cough medicaiton is not working and he is needing something else called in    Best number [phone]

## 2016-10-16 ENCOUNTER — Telehealth: Payer: Self-pay

## 2016-10-16 NOTE — Telephone Encounter (Signed)
Patient needs his glipiZIDE (GLUCOTROL XL) 5 MG 24 hr tablet refilled but stated that he does not use the extended release capsule anymore he just wants the regular pill that he use to take.  [facility] uses the [facility] on [address].

## 2016-10-18 NOTE — Telephone Encounter (Signed)
07/2016 last labs  Please refill as regular as requested if appropriate

## 2016-10-21 ENCOUNTER — Encounter: Payer: Self-pay | Admitting: Physician Assistant

## 2016-10-21 ENCOUNTER — Other Ambulatory Visit: Payer: Self-pay | Admitting: Physician Assistant

## 2016-10-21 MED ORDER — GLIPIZIDE 5 MG PO TABS: 5.0000 mg | ORAL_TABLET | Freq: Every day | ORAL | 1 refills | Status: DC

## 2016-10-21 NOTE — Progress Notes (Signed)
Patient requesting glipizide.  Will write.  Advised via mychart that I would like to see him in six months for diabetes follow up.  Philis Fendt, MS, PA-C 11:50 AM, 10/21/2016

## 2016-10-22 ENCOUNTER — Ambulatory Visit (INDEPENDENT_AMBULATORY_CARE_PROVIDER_SITE_OTHER): Payer: Medicare Other

## 2016-10-22 ENCOUNTER — Encounter: Payer: Self-pay | Admitting: Family Medicine

## 2016-10-22 ENCOUNTER — Ambulatory Visit (INDEPENDENT_AMBULATORY_CARE_PROVIDER_SITE_OTHER): Payer: Medicare Other | Admitting: Family Medicine

## 2016-10-22 DIAGNOSIS — M25552 Pain in left hip: Secondary | ICD-10-CM | POA: Diagnosis not present

## 2016-10-22 DIAGNOSIS — I10 Essential (primary) hypertension: Secondary | ICD-10-CM

## 2016-10-22 DIAGNOSIS — M25551 Pain in right hip: Secondary | ICD-10-CM

## 2016-10-22 DIAGNOSIS — E119 Type 2 diabetes mellitus without complications: Secondary | ICD-10-CM | POA: Diagnosis not present

## 2016-10-22 DIAGNOSIS — M545 Low back pain, unspecified: Secondary | ICD-10-CM

## 2016-10-22 LAB — HM DIABETES EYE EXAM

## 2016-10-22 MED ORDER — GLIPIZIDE ER 5 MG PO TB24: 5.0000 mg | ORAL_TABLET | Freq: Every day | ORAL | 3 refills | Status: DC

## 2016-10-22 NOTE — Patient Instructions (Addendum)
Change Glipizide to 2 tablets with supper.    IF you received an x-ray today, you will receive an invoice from [facility]. Please contact [facility] at [phone] with questions or concerns regarding your invoice.   IF you received labwork today, you will receive an invoice from [facility]. Please contact [facility] at [phone] with questions or concerns regarding your invoice.   Our billing staff will not be able to assist you with questions regarding bills from these companies.  You will be contacted with the lab results as soon as they are available. The fastest way to get your results is to activate your My Chart account. Instructions are located on the last page of this paperwork. If you have not heard from [place] regarding the results in 2 weeks, please contact this office.      Low Back Sprain Rehab Ask your health care provider which exercises are safe for you. Do exercises exactly as told by your health care provider and adjust them as directed. It is normal to feel mild stretching, pulling, tightness, or discomfort as you do these exercises, but you should stop right away if you feel sudden pain or your pain gets worse. Do not begin these exercises until told by your health care provider. Stretching and range of motion exercises These exercises warm up your muscles and joints and improve the movement and flexibility of your back. These exercises also help to relieve pain, numbness, and tingling. Exercise A: Lumbar rotation 1. Lie on your back on a firm surface and bend your knees. 2. Straighten your arms out to your sides so each arm forms an "L" shape with a side of your body (a 90 degree angle). 3. Slowly move both of your knees to one side of your body until you feel a stretch in your lower back. Try not to let your shoulders move off of the floor. 4. Hold for __________ seconds. 5. Tense your abdominal muscles and slowly move your knees back to the starting  position. 6. Repeat this exercise on the other side of your body. Repeat __________ times. Complete this exercise __________ times a day. Exercise B: Prone extension on elbows 1. Lie on your abdomen on a firm surface. 2. Prop yourself up on your elbows. 3. Use your arms to help lift your chest up until you feel a gentle stretch in your abdomen and your lower back.  This will place some of your body weight on your elbows. If this is uncomfortable, try stacking pillows under your chest.  Your hips should stay down, against the surface that you are lying on. Keep your hip and back muscles relaxed. 4. Hold for __________ seconds. 5. Slowly relax your upper body and return to the starting position. Repeat __________ times. Complete this exercise __________ times a day. Strengthening exercises These exercises build strength and endurance in your back. Endurance is the ability to use your muscles for a long time, even after they get tired. Exercise C: Pelvic tilt 1. Lie on your back on a firm surface. Bend your knees and keep your feet flat. 2. Tense your abdominal muscles. Tip your pelvis up toward the ceiling and flatten your lower back into the floor.  To help with this exercise, you may place a small towel under your lower back and try to push your back into the towel. 3. Hold for __________ seconds. 4. Let your muscles relax completely before you repeat this exercise. Repeat __________ times. Complete this exercise __________ times  a day. Exercise D: Alternating arm and leg raises 1. Get on your hands and knees on a firm surface. If you are on a hard floor, you may want to use padding to cushion your knees, such as an exercise mat. 2. Line up your arms and legs. Your hands should be below your shoulders, and your knees should be below your hips. 3. Lift your left leg behind you. At the same time, raise your right arm and straighten it in front of you.  Do not lift your leg higher than your  hip.  Do not lift your arm higher than your shoulder.  Keep your abdominal and back muscles tight.  Keep your hips facing the ground.  Do not arch your back.  Keep your balance carefully, and do not hold your breath. 4. Hold for __________ seconds. 5. Slowly return to the starting position and repeat with your right leg and your left arm. Repeat __________ times. Complete this exercise __________ times a day. Exercise E: Abdominal set with straight leg raise 1. Lie on your back on a firm surface. 2. Bend one of your knees and keep your other leg straight. 3. Tense your abdominal muscles and lift your straight leg up, 4-6 inches (10-15 cm) off the ground. 4. Keep your abdominal muscles tight and hold for __________ seconds.  Do not hold your breath.  Do not arch your back. Keep it flat against the ground. 5. Keep your abdominal muscles tense as you slowly lower your leg back to the starting position. 6. Repeat with your other leg. Repeat __________ times. Complete this exercise __________ times a day. Posture and body mechanics   Body mechanics refers to the movements and positions of your body while you do your daily activities. Posture is part of body mechanics. Good posture and healthy body mechanics can help to relieve stress in your body's tissues and joints. Good posture means that your spine is in its natural S-curve position (your spine is neutral), your shoulders are pulled back slightly, and your head is not tipped forward. The following are general guidelines for applying improved posture and body mechanics to your everyday activities. Standing   When standing, keep your spine neutral and your feet about hip-width apart. Keep a slight bend in your knees. Your ears, shoulders, and hips should line up.  When you do a task in which you stand in one place for a long time, place one foot up on a stable object that is 2-4 inches (5-10 cm) high, such as a footstool. This helps  keep your spine neutral. Sitting  When sitting, keep your spine neutral and keep your feet flat on the floor. Use a footrest, if necessary, and keep your thighs parallel to the floor. Avoid rounding your shoulders, and avoid tilting your head forward.  When working at a desk or a computer, keep your desk at a height where your hands are slightly lower than your elbows. Slide your chair under your desk so you are close enough to maintain good posture.  When working at a computer, place your monitor at a height where you are looking straight ahead and you do not have to tilt your head forward or downward to look at the screen. Resting   When lying down and resting, avoid positions that are most painful for you.  If you have pain with activities such as sitting, bending, stooping, or squatting (flexion-based activities), lie in a position in which your body does not bend  very much. For example, avoid curling up on your side with your arms and knees near your chest (fetal position).  If you have pain with activities such as standing for a long time or reaching with your arms (extension-based activities), lie with your spine in a neutral position and bend your knees slightly. Try the following positions:  Lying on your side with a pillow between your knees.  Lying on your back with a pillow under your knees. Lifting   When lifting objects, keep your feet at least shoulder-width apart and tighten your abdominal muscles.  Bend your knees and hips and keep your spine neutral. It is important to lift using the strength of your legs, not your back. Do not lock your knees straight out.  Always ask for help to lift heavy or awkward objects. This information is not intended to replace advice given to you by your health care provider. Make sure you discuss any questions you have with your health care provider. Document Released: 09/09/2005 Document Revised: 05/16/2016 Document Reviewed:  06/21/2015 Elsevier Interactive Patient Education  2017 [employer].

## 2016-10-22 NOTE — Progress Notes (Signed)
Subjective:    Patient ID: Gregory Mathews., male    DOB: 07-28-1946, 71 y.o.   MRN: [id number]  10/22/2016  Follow-up (Diabetes) and Leg Pain (Recurring)   HPI This 71 y.o. male presents for four month follow-up of DMII, Fasting sugars are high; fasting sugars are 195.  Sugars improve as the day progresses.  Last HgbA1c 6.6.  Sugar 113 at last visit.  Fasting sugar 165 this morning.  Started Glipizide ER six months ago.   B: egg, bacon, tomatoes, coffee  Snack: banana or tomatoes Lunch: salad or sandwich, water Snack: banana Supper: big supper; wife loves to fry food; loves bread; can overdo it at night.  Tries to eat supper before 3:00pm;  Snack: eats tomato juice and crackers or broth and wontons fried.  Takes Glipizide every morning at 6:00am; second dose at 3:00pm. Goes to bed at 8:30am; gets up at 4:00am.  Bible study and then walks.   Involved in a lot of clubs at schools.  Will have light breakfast in the morning.  Rare low sugars lately. Sugars have increased in past six months.   Eats a lot of bananas each week.  Has done it forever.    Hip pain B: started hurting at night; cannot take statin therapy; feels just like taking statin therapy.  Draining the strength out of them. Rolling to keep the pain off of one hip; if lays on hip too long, has pian; sometimes must lay flat.  Not sleeping well.    Meloxicam not effective; Ibuprofen works as well.  Performs daily exercises.  Immunization History  Administered Date(s) Administered  . Influenza Split 07/09/2012  . Influenza,inj,Quad PF,36+ Mos 07/20/2013, 07/19/2014, 08/01/2015, 08/14/2016  . Pneumococcal Conjugate-13 08/22/2014  . Pneumococcal Polysaccharide-23 07/09/2012  . Tdap 08/01/2015  . Zoster 09/23/2005   BP Readings from Last 3 Encounters:  08/30/16 124/62  08/14/16 122/60  03/13/16 126/78   Wt Readings from Last 3 Encounters:  08/30/16 213 lb (96.6 kg)  08/14/16 213 lb 12.8 oz (97 kg)  03/13/16 217 lb  (98.4 kg)    Review of Systems  Constitutional: Negative for activity change, appetite change, chills, diaphoresis, fatigue and fever.  Respiratory: Negative for cough and shortness of breath.   Cardiovascular: Negative for chest pain, palpitations and leg swelling.  Gastrointestinal: Negative for abdominal pain, diarrhea, nausea and vomiting.  Endocrine: Negative for cold intolerance, heat intolerance, polydipsia, polyphagia and polyuria.  Musculoskeletal: Positive for arthralgias, back pain, gait problem and myalgias.  Skin: Negative for color change, rash and wound.  Neurological: Negative for dizziness, tremors, seizures, syncope, facial asymmetry, speech difficulty, weakness, light-headedness, numbness and headaches.  Psychiatric/Behavioral: Negative for dysphoric mood and sleep disturbance. The patient is not nervous/anxious.     Past Medical History:  Diagnosis Date  . Allergy   . Basal cell carcinoma 01/2013   Nasal and R forearm.  [facility]  . Cataract   . Diabetes mellitus without complication ([place])   . Hyperlipidemia   . Rosacea    Past Surgical History:  Procedure Laterality Date  . basal cell removal     nose and wrist   . BELPHAROPTOSIS REPAIR    . EYE SURGERY     Cataract surgery  . eyelid surgery    . SHOULDER OPEN ROTATOR CUFF REPAIR  1983   Allergies  Allergen Reactions  . Metformin And Related Diarrhea  . Statins Other (See Comments)    Severe leg pain    Social History  Social History  . Marital status: Married    Spouse name: [name]  . Number of children: 4  . Years of education: 20   Occupational History  . bus driver/transportation [place] History Main Topics  . Smoking status: Former Smoker    Packs/day: 1.00    Years: 15.00    Quit date: 07/09/1982  . Smokeless tobacco: Never Used  . Alcohol use No  . Drug use: No  . Sexual activity: Yes    Partners: Female   Other Topics Concern  . Not on  file   Social History Narrative   Marital status: married x 76 years; happily married.       Children:  4 children; 4 grandchildren. Adult children all live locally.      Lives: with wife      Employment: retired from bus transportation; PRN driving now activity bus. [employer].  Chicken farming.      Tobacco:  In past; smoked x 15 years; quit 35 years ago.      Alcohol: none      Exercise:sporadic in 2017.      ADLs: independent with all ADLs.  Does not use assistant device with ambulation.      Living Will:  Has living will; desires FULL CODE; no prolonged measures.      Doctoral degree in Biblical Studies.       Family History  Problem Relation Age of Onset  . Cancer Mother     leukemia  . Stroke Mother   . Heart disease Father   . Stroke Father   . Colon cancer Father 64  . Cancer Father 45    Colon cancer  . Diabetes Sister   . Heart disease Brother   . Cancer Maternal Grandmother   . Stroke Maternal Grandfather   . Diabetes Brother   . Diabetes Brother   . Cancer Brother     colon cancer       Objective:    There were no vitals taken for this visit. Physical Exam  Constitutional: He is oriented to person, place, and time. He appears well-developed and well-nourished. No distress.  HENT:  Head: Normocephalic and atraumatic.  Right Ear: External ear normal.  Left Ear: External ear normal.  Nose: Nose normal.  Mouth/Throat: Oropharynx is clear and moist.  Eyes: Conjunctivae and EOM are normal. Pupils are equal, round, and reactive to light.  Neck: Normal range of motion. Neck supple. Carotid bruit is not present. No thyromegaly present.  Cardiovascular: Normal rate, regular rhythm, normal heart sounds and intact distal pulses.  Exam reveals no gallop and no friction rub.   No murmur heard. Pulmonary/Chest: Effort normal and breath sounds normal. He has no wheezes. He has no rales.  Abdominal: Soft. Bowel sounds are normal. He exhibits no distension and no  mass. There is no tenderness. There is no rebound and no guarding.  Musculoskeletal:       Right hip: Normal.       Left hip: Normal.       Lumbar back: Normal.       Right upper leg: Normal.       Left upper leg: Normal.  Lymphadenopathy:    He has no cervical adenopathy.  Neurological: He is alert and oriented to person, place, and time. No cranial nerve deficit.  Skin: Skin is warm and dry. No rash noted. He is not diaphoretic.  Psychiatric: He has a normal mood and affect.  His behavior is normal.  Nursing note and vitals reviewed.  Results for orders placed or performed in visit on 08/14/16  CBC with Differential/Platelet  Result Value Ref Range   WBC 8.2 3.8 - 10.8 K/uL   RBC 4.41 4.20 - 5.80 MIL/uL   Hemoglobin 13.5 13.2 - 17.1 g/dL   HCT 40.2 38.5 - 50.0 %   MCV 91.2 80.0 - 100.0 fL   MCH 30.6 27.0 - 33.0 pg   MCHC 33.6 32.0 - 36.0 g/dL   RDW 13.2 11.0 - 15.0 %   Platelets 226 140 - 400 K/uL   MPV 10.5 7.5 - 12.5 fL   Neutro Abs 5,166 1,500 - 7,800 cells/uL   Lymphs Abs 2,050 850 - 3,900 cells/uL   Monocytes Absolute 574 200 - 950 cells/uL   Eosinophils Absolute 328 15 - 500 cells/uL   Basophils Absolute 82 0 - 200 cells/uL   Neutrophils Relative % 63 %   Lymphocytes Relative 25 %   Monocytes Relative 7 %   Eosinophils Relative 4 %   Basophils Relative 1 %   Smear Review Criteria for review not met   Comprehensive metabolic panel  Result Value Ref Range   Sodium 138 135 - 146 mmol/L   Potassium 4.4 3.5 - 5.3 mmol/L   Chloride 104 98 - 110 mmol/L   CO2 25 20 - 31 mmol/L   Glucose, Bld 113 (H) 65 - 99 mg/dL   BUN 14 7 - 25 mg/dL   Creat 0.79 0.70 - 1.25 mg/dL   Total Bilirubin 0.7 0.2 - 1.2 mg/dL   Alkaline Phosphatase 59 40 - 115 U/L   AST 22 10 - 35 U/L   ALT 24 9 - 46 U/L   Total Protein 7.8 6.1 - 8.1 g/dL   Albumin 4.6 3.6 - 5.1 g/dL   Calcium 9.8 8.6 - 10.3 mg/dL  Lipid panel  Result Value Ref Range   Cholesterol 174 <200 mg/dL   Triglycerides 120  <150 mg/dL   HDL 37 (L) >40 mg/dL   Total CHOL/HDL Ratio 4.7 <5.0 Ratio   VLDL 24 <30 mg/dL   LDL Cholesterol 113 (H) <100 mg/dL  TSH  Result Value Ref Range   TSH 1.08 0.40 - 4.50 mIU/L  Microalbumin, urine  Result Value Ref Range   Microalb, Ur <0.2 Not estab mg/dL  Hemoglobin A1c  Result Value Ref Range   Hgb A1c MFr Bld 6.6 (H) <5.7 %   Mean Plasma Glucose 143 mg/dL  PSA  Result Value Ref Range   PSA 0.7 <=4.0 ng/mL  Testosterone  Result Value Ref Range   Testosterone 209 (L) 250 - 827 ng/dL  POCT urinalysis dipstick  Result Value Ref Range   Color, UA yellow yellow   Clarity, UA clear clear   Glucose, UA negative negative   Bilirubin, UA negative negative   Ketones, POC UA negative negative   Spec Grav, UA 1.010    Blood, UA negative negative   pH, UA 7.0    Protein Ur, POC negative negative   Urobilinogen, UA 0.2    Nitrite, UA Negative Negative   Leukocytes, UA Negative Negative       Assessment & Plan:   1. Acute bilateral low back pain without sciatica   2. Hip pain, bilateral   3. Type 2 diabetes mellitus without complication, without long-term current use of insulin (HCC)    -worsening/presistent; obtain LS spine films and hip films; home exercise program provided; refer to ortho  due to persistence of symptoms. -rx for Glipizide provided; sugars worsening with decreased activity level.  Obtain labs.   Orders Placed This Encounter  Procedures  . DG Lumbar Spine Complete    Standing Status:   Future    Number of Occurrences:   1    Standing Expiration Date:   10/22/2017    Order Specific Question:   Reason for Exam (SYMPTOM  OR DIAGNOSIS REQUIRED)    Answer:   lower back pain wiht B hip pain    Order Specific Question:   Preferred imaging location?    Answer:   External  . DG HIPS BILAT W OR W/O PELVIS 2V    Standing Status:   Future    Number of Occurrences:   1    Standing Expiration Date:   10/22/2017    Order Specific Question:   Reason for  Exam (SYMPTOM  OR DIAGNOSIS REQUIRED)    Answer:   lower back pain wiht B hip pain    Order Specific Question:   Preferred imaging location?    Answer:   External  . Ambulatory referral to Orthopedic Surgery    Referral Priority:   Routine    Referral Type:   Surgical    Referral Reason:   Specialty Services Required    Requested Specialty:   Orthopedic Surgery    Number of Visits Requested:   1   Meds ordered this encounter  Medications  . glipiZIDE (GLUCOTROL XL) 5 MG 24 hr tablet    Sig: Take 1-2 tablets (5-10 mg total) by mouth daily with breakfast.    Dispense:  180 tablet    Refill:  3    No Follow-up on file.   Sohum Delillo Elayne Guerin, M.D. Primary Care at [facility] previously [facility] [place] [address] [place], [place]  [address] [phone] phone [phone] fax

## 2016-10-23 ENCOUNTER — Ambulatory Visit: Payer: Medicare Other | Admitting: Family Medicine

## 2016-11-04 ENCOUNTER — Ambulatory Visit (INDEPENDENT_AMBULATORY_CARE_PROVIDER_SITE_OTHER): Payer: Medicare Other | Admitting: Family Medicine

## 2016-11-04 DIAGNOSIS — R05 Cough: Secondary | ICD-10-CM | POA: Diagnosis not present

## 2016-11-04 DIAGNOSIS — R059 Cough, unspecified: Secondary | ICD-10-CM | POA: Insufficient documentation

## 2016-11-04 MED ORDER — BENZONATATE 100 MG PO CAPS: 100.0000 mg | ORAL_CAPSULE | Freq: Two times a day (BID) | ORAL | 0 refills | Status: DC | PRN

## 2016-11-04 NOTE — Assessment & Plan Note (Signed)
Likely related to URI vs flu. He looks well on exam today.  - tessalon for cough  - can try other OTC meds as well  - given indications for return.

## 2016-11-04 NOTE — Patient Instructions (Signed)
Thank you for coming in,   You can continue to take ibuprofen for fever and pain.   You can try other over the counter medications such as dextromethorphan.   Please follow up with [place] if your fever lasts until the end of the week or you seem to be getting worse.    Please feel free to call with any questions or concerns at any time, at [phone]. --Dr. Raeford Razor

## 2016-11-04 NOTE — Progress Notes (Signed)
Subjective:    Patient ID: Gregory Mathews., male    DOB: June 24, 1946, 71 y.o.   MRN: [id number]  Chief Complaint  Patient presents with  . URI    cough x 2 days chest aches    PCP: Kathlen Brunswick, PA-C  HPI  This is a 71 y.o. male who is presenting with cough. He is having some abdominal muscle pain with coughing.  The coughing is worse with lying down.  He reports having a fever of 100 last night.  His grandchildren were sick.  He took ibuprofen this morning  He received the flu vaccine.  He has tried several OTC.  He has had body aches and chills.  Symptoms started initially Saturday.  He denies any history of lung disease.   Review of Systems  ROS: No unexpected weight loss, swelling, instability, muscle pain, numbness/tingling, redness, otherwise see HPI    Social: quit smoking 35 years ago. No alcohol use  Fhx: dm2, CVA   Patient Active Problem List   Diagnosis Date Noted  . Cough 11/04/2016  . 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 BMI 31.0-31.9,adult 03/13/2016  . Chest pain 08/29/2015  . Bruit 08/29/2015  . Essential hypertension 08/29/2015  . Diverticulosis of colon without hemorrhage 08/01/2015  . Statin intolerance 08/01/2015  . Basal cell carcinoma of nose 08/23/2014  . Rosacea 08/23/2014  . Seasonal allergies 01/26/2013  . Diabetes mellitus type 2, uncomplicated ([place]) 46/43/1427  . Hyperlipidemia 07/09/2012  . Erectile dysfunction 07/09/2012     Prior to Admission medications   Medication Sig Start Date End Date Taking? Authorizing Provider  aspirin 81 MG tablet Take 81 mg by mouth every other day.    Yes Historical Provider, MD  glipiZIDE (GLUCOTROL XL) 5 MG 24 hr tablet Take 1-2 tablets (5-10 mg total) by mouth daily with breakfast. 10/22/16  Yes Wardell Honour, MD  Lancets MISC Use to test blood sugar daily. Dx code :E11.9 10/11/15  Yes Tereasa Coop, PA-C  losartan (COZAAR) 25 MG tablet Take 1 tablet (25 mg total) by mouth daily.  08/14/16  Yes Wardell Honour, MD  Multiple Vitamin (MULTIVITAMIN) tablet Take 1 tablet by mouth daily.   Yes Historical Provider, MD  ONE TOUCH ULTRA TEST test strip TEST BLOOD SUGAR DAILY 06/25/16  Yes Tereasa Coop, PA-C  albuterol (PROVENTIL HFA;VENTOLIN HFA) 108 (90 Base) MCG/ACT inhaler Inhale 2 puffs into the lungs every 4 (four) hours as needed for wheezing or shortness of breath (cough, shortness of breath or wheezing.). Patient not taking: Reported on 11/04/2016 08/30/16   Harrison Mons, PA-C  blood glucose meter kit and supplies Use to test blood sugar daily. Dx: E11.9 Patient not taking: Reported on 11/04/2016 10/11/15   Tereasa Coop, PA-C  lisinopril (PRINIVIL,ZESTRIL) 5 MG tablet TAKE 1 TABLET (5 MG TOTAL) BY MOUTH DAILY. Patient not taking: Reported on 11/04/2016 07/10/16   Tereasa Coop, PA-C  meloxicam (MOBIC) 15 MG tablet Take 1 tablet (15 mg total) by mouth daily. Patient not taking: Reported on 11/04/2016 08/14/16   Wardell Honour, MD     Allergies  Allergen Reactions  . Metformin And Related Diarrhea  . Statins Other (See Comments)    Severe leg pain      Objective:   Physical Exam BP (!) 144/76 (Cuff Size: Normal)   Pulse 70   Temp 97.9 F (36.6 C)   Resp 16   Wt 218 lb 6.4 oz (99.1 kg)  SpO2 97%   BMI 32.25 kg/m  Gen: NAD, alert, cooperative with exam, HEENT: NCAT, EOMI, clear conjunctiva, oropharynx clear, supple neck, TM's clear and intact, no tonsillar exudates, no cervical LAD,  CV: RRR, good S1/S2, no murmur, no edema, capillary refill brisk  Resp: CTABL, no wheezes, non-labored, no crackles  Skin: no rashes, normal turgor  Neuro: no gross deficits.  Psych: alert and oriented      Assessment & Plan:   Cough Likely related to URI vs flu. He looks well on exam today.  - tessalon for cough  - can try other OTC meds as well  - given indications for return.

## 2016-11-13 ENCOUNTER — Ambulatory Visit: Payer: Medicare Other | Admitting: Family Medicine

## 2016-11-29 ENCOUNTER — Ambulatory Visit (INDEPENDENT_AMBULATORY_CARE_PROVIDER_SITE_OTHER): Payer: Medicare Other | Admitting: Physician Assistant

## 2016-11-29 VITALS — BP 124/80 | HR 75 | Temp 98.0°F | Resp 17 | Ht 69.5 in | Wt 218.0 lb

## 2016-11-29 DIAGNOSIS — E119 Type 2 diabetes mellitus without complications: Secondary | ICD-10-CM | POA: Diagnosis not present

## 2016-11-29 DIAGNOSIS — I10 Essential (primary) hypertension: Secondary | ICD-10-CM | POA: Diagnosis not present

## 2016-11-29 NOTE — Patient Instructions (Addendum)
I will contact you via mychart on your lab results and we will go from there.  Here are some additional diet modifications.  DASH Eating Plan DASH stands for "Dietary Approaches to Stop Hypertension." The DASH eating plan is a healthy eating plan that has been shown to reduce high blood pressure (hypertension). It may also reduce your risk for type 2 diabetes, heart disease, and stroke. The DASH eating plan may also help with weight loss. What are tips for following this plan? General guidelines   Avoid eating more than 2,300 mg (milligrams) of salt (sodium) a day. If you have hypertension, you may need to reduce your sodium intake to 1,500 mg a day.  Limit alcohol intake to no more than 1 drink a day for nonpregnant women and 2 drinks a day for men. One drink equals 12 oz of beer, 5 oz of wine, or 1 oz of hard liquor.  Work with your health care provider to maintain a healthy body weight or to lose weight. Ask what an ideal weight is for you.  Get at least 30 minutes of exercise that causes your heart to beat faster (aerobic exercise) most days of the week. Activities may include walking, swimming, or biking.  Work with your health care provider or diet and nutrition specialist (dietitian) to adjust your eating plan to your individual calorie needs. Reading food labels   Check food labels for the amount of sodium per serving. Choose foods with less than 5 percent of the Daily Value of sodium. Generally, foods with less than 300 mg of sodium per serving fit into this eating plan.  To find whole grains, look for the word "whole" as the first word in the ingredient list. Shopping   Buy products labeled as "low-sodium" or "no salt added."  Buy fresh foods. Avoid canned foods and premade or frozen meals. Cooking   Avoid adding salt when cooking. Use salt-free seasonings or herbs instead of table salt or sea salt. Check with your health care provider or pharmacist before using salt  substitutes.  Do not fry foods. Cook foods using healthy methods such as baking, boiling, grilling, and broiling instead.  Cook with heart-healthy oils, such as olive, canola, soybean, or sunflower oil. Meal planning    Eat a balanced diet that includes:  5 or more servings of fruits and vegetables each day. At each meal, try to fill half of your plate with fruits and vegetables.  Up to 6-8 servings of whole grains each day.  Less than 6 oz of lean meat, poultry, or fish each day. A 3-oz serving of meat is about the same size as a deck of cards. One egg equals 1 oz.  2 servings of low-fat dairy each day.  A serving of nuts, seeds, or beans 5 times each week.  Heart-healthy fats. Healthy fats called Omega-3 fatty acids are found in foods such as flaxseeds and coldwater fish, like sardines, salmon, and mackerel.  Limit how much you eat of the following:  Canned or prepackaged foods.  Food that is high in trans fat, such as fried foods.  Food that is high in saturated fat, such as fatty meat.  Sweets, desserts, sugary drinks, and other foods with added sugar.  Full-fat dairy products.  Do not salt foods before eating.  Try to eat at least 2 vegetarian meals each week.  Eat more home-cooked food and less restaurant, buffet, and fast food.  When eating at a restaurant, ask that your  food be prepared with less salt or no salt, if possible. What foods are recommended? The items listed may not be a complete list. Talk with your dietitian about what dietary choices are best for you. Grains  Whole-grain or whole-wheat bread. Whole-grain or whole-wheat pasta. Brown rice. [place]. Bulgur. Whole-grain and low-sodium cereals. Pita bread. Low-fat, low-sodium crackers. Whole-wheat flour tortillas. Vegetables  Fresh or frozen vegetables (raw, steamed, roasted, or grilled). Low-sodium or reduced-sodium tomato and vegetable juice. Low-sodium or reduced-sodium tomato sauce and  tomato paste. Low-sodium or reduced-sodium canned vegetables. Fruits  All fresh, dried, or frozen fruit. Canned fruit in natural juice (without added sugar). Meat and other protein foods  Skinless chicken or [place]. Ground chicken or [place]. Pork with fat trimmed off. Fish and seafood. Egg whites. Dried beans, peas, or lentils. Unsalted nuts, nut butters, and seeds. Unsalted canned beans. Lean cuts of beef with fat trimmed off. Low-sodium, lean deli meat. Dairy  Low-fat (1%) or fat-free (skim) milk. Fat-free, low-fat, or reduced-fat cheeses. Nonfat, low-sodium ricotta or cottage cheese. Low-fat or nonfat yogurt. Low-fat, low-sodium cheese. Fats and oils  Soft margarine without trans fats. Vegetable oil. Low-fat, reduced-fat, or light mayonnaise and salad dressings (reduced-sodium). Canola, safflower, olive, soybean, and sunflower oils. Avocado. Seasoning and other foods  Herbs. Spices. Seasoning mixes without salt. Unsalted popcorn and pretzels. Fat-free sweets. What foods are not recommended? The items listed may not be a complete list. Talk with your dietitian about what dietary choices are best for you. Grains  Baked goods made with fat, such as croissants, muffins, or some breads. Dry pasta or rice meal packs. Vegetables  Creamed or fried vegetables. Vegetables in a cheese sauce. Regular canned vegetables (not low-sodium or reduced-sodium). Regular canned tomato sauce and paste (not low-sodium or reduced-sodium). Regular tomato and vegetable juice (not low-sodium or reduced-sodium). Angie Fava. Olives. Fruits  Canned fruit in a light or heavy syrup. Fried fruit. Fruit in cream or butter sauce. Meat and other protein foods  Fatty cuts of meat. Ribs. Fried meat. Berniece Salines. Sausage. Bologna and other processed lunch meats. Salami. Fatback. Hotdogs. Bratwurst. Salted nuts and seeds. Canned beans with added salt. Canned or smoked fish. Whole eggs or egg yolks. Chicken or [place] with skin. Dairy  Whole  or 2% milk, cream, and half-and-half. Whole or full-fat cream cheese. Whole-fat or sweetened yogurt. Full-fat cheese. Nondairy creamers. Whipped toppings. Processed cheese and cheese spreads. Fats and oils  Butter. Stick margarine. Lard. Shortening. Ghee. Bacon fat. Tropical oils, such as coconut, palm kernel, or palm oil. Seasoning and other foods  Salted popcorn and pretzels. Onion salt, garlic salt, seasoned salt, table salt, and sea salt. Worcestershire sauce. Tartar sauce. Barbecue sauce. Teriyaki sauce. Soy sauce, including reduced-sodium. Steak sauce. Canned and packaged gravies. Fish sauce. Oyster sauce. Cocktail sauce. Horseradish that you find on the shelf. Ketchup. Mustard. Meat flavorings and tenderizers. Bouillon cubes. Hot sauce and Tabasco sauce. Premade or packaged marinades. Premade or packaged taco seasonings. Relishes. Regular salad dressings. Where to find more information:  National Heart, Lung, and [place]: https://wilson-eaton.com/  American Heart Association: www.heart.org Summary  The DASH eating plan is a healthy eating plan that has been shown to reduce high blood pressure (hypertension). It may also reduce your risk for type 2 diabetes, heart disease, and stroke.  With the DASH eating plan, you should limit salt (sodium) intake to 2,300 mg a day. If you have hypertension, you may need to reduce your sodium intake to 1,500 mg a day.  When on the DASH eating plan, aim to eat more fresh fruits and vegetables, whole grains, lean proteins, low-fat dairy, and heart-healthy fats.  Work with your health care provider or diet and nutrition specialist (dietitian) to adjust your eating plan to your individual calorie needs. This information is not intended to replace advice given to you by your health care provider. Make sure you discuss any questions you have with your health care provider. Document Released: 08/29/2011 Document Revised: 09/02/2016 Document Reviewed:  09/02/2016 Elsevier Interactive Patient Education  2017 [employer].   IF you received an x-ray today, you will receive an invoice from [facility]. Please contact [facility] at [phone] with questions or concerns regarding your invoice.   IF you received labwork today, you will receive an invoice from [facility]. Please contact [facility] at [phone] with questions or concerns regarding your invoice.   Our billing staff will not be able to assist you with questions regarding bills from these companies.  You will be contacted with the lab results as soon as they are available. The fastest way to get your results is to activate your My Chart account. Instructions are located on the last page of this paperwork. If you have not heard from [place] regarding the results in 2 weeks, please contact this office.

## 2016-11-29 NOTE — Progress Notes (Signed)
[facility] [address] [place] [address] [phone]  Date:  11/29/2016   Name:  Gregory Mathews.   DOB:  [birth year]   MRN:  [id number]  PCP:  Gregory Brunswick, PA-C    History of Present Illness:  Gregory Mathews. is a 71 y.o. male patient who presents to [facility] for cc of bp.  Changed medication from lisinopril to losartan, due to a cough..   He is checking his bp at home at 133/??. DM2: He has been checking his blood sugar 160s-170s.  He has not been able to exercise like he does normally.   He has now resumed his walks for 45 minutes in the last 3-4 weeks.  He had stopped for a period secondary to fatigue brought on by the flu.  It is never over 200s.  He has cut out the breads and sugars.   No numbness or tingling, no frequency, no polydipsia or nausea.   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     Patient Active Problem List   Diagnosis Date Noted  . Cough 11/04/2016  . 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 BMI 31.0-31.9,adult 03/13/2016  . Chest pain 08/29/2015  . Bruit 08/29/2015  . Essential hypertension 08/29/2015  . Diverticulosis of colon without hemorrhage 08/01/2015  . Statin intolerance 08/01/2015  . Basal cell carcinoma of nose 08/23/2014  . Rosacea 08/23/2014  . Seasonal allergies 01/26/2013  . Diabetes mellitus type 2, uncomplicated ([place]) 17/91/5056  . Hyperlipidemia 07/09/2012  . Erectile dysfunction 07/09/2012    Past Medical History:  Diagnosis Date  . Allergy   . Basal cell carcinoma 01/2013   Nasal and R forearm.  [facility]  . Cataract   . Diabetes mellitus without complication ([place])   . Hyperlipidemia   . Rosacea     Past Surgical History:  Procedure Laterality Date  . basal cell removal     nose and wrist   . BELPHAROPTOSIS REPAIR    . EYE SURGERY     Cataract surgery  . eyelid surgery    . SHOULDER OPEN ROTATOR CUFF REPAIR  1983     Social History  Substance Use Topics  . Smoking status: Former Smoker    Packs/day: 1.00    Years: 15.00    Quit date: 07/09/1982  . Smokeless tobacco: Never Used  . Alcohol use No    Family History  Problem Relation Age of Onset  . Cancer Mother     leukemia  . Stroke Mother   . Heart disease Father   . Stroke Father   . Colon cancer Father 41  . Cancer Father 45    Colon cancer  . Diabetes Sister   . Heart disease Brother   . Cancer Maternal Grandmother   . Stroke Maternal Grandfather   . Diabetes Brother   . Diabetes Brother   . Cancer Brother     colon cancer    Allergies  Allergen Reactions  . Metformin And Related Diarrhea  . Statins Other (See Comments)    Severe leg pain    Medication list has been reviewed and updated.  Current Outpatient Prescriptions on File Prior to Visit  Medication Sig Dispense Refill  . aspirin 81 MG tablet Take 81 mg by mouth every other day.     . blood glucose meter kit and supplies Use to test blood sugar daily. Dx: E11.9  1 each 0  . glipiZIDE (GLUCOTROL XL) 5 MG 24 hr tablet Take 1-2 tablets (5-10 mg total) by mouth daily with breakfast. 180 tablet 3  . Lancets MISC Use to test blood sugar daily. Dx code :E11.9 100 each 3  . losartan (COZAAR) 25 MG tablet Take 1 tablet (25 mg total) by mouth daily. 90 tablet 1  . Multiple Vitamin (MULTIVITAMIN) tablet Take 1 tablet by mouth daily.    . ONE TOUCH ULTRA TEST test strip TEST BLOOD SUGAR DAILY 50 each 2   No current facility-administered medications on file prior to visit.     ROS ROS otherwise unremarkable unless listed above.  Physical Examination: BP 124/80   Pulse 75   Temp 98 F (36.7 C) (Oral)   Resp 17   Ht 5' 9.5" (1.765 m)   Wt 218 lb (98.9 kg)   SpO2 97%   BMI 31.73 kg/m  Ideal Body Weight: Weight in (lb) to have BMI = 25: 171.4  Physical Exam  Constitutional: He is oriented to person, place, and time. He appears well-developed and well-nourished. No  distress.  HENT:  Head: Normocephalic and atraumatic.  Eyes: Conjunctivae and EOM are normal. Pupils are equal, round, and reactive to light.  Cardiovascular: Normal rate.   Pulmonary/Chest: Effort normal. No respiratory distress.  Neurological: He is alert and oriented to person, place, and time.  Skin: Skin is warm and dry. He is not diaphoretic.  Psychiatric: He has a normal mood and affect. His behavior is normal.     Assessment and Plan: Gregory Mathews. is a 71 y.o. male who is here today  With abnormal readings, we will go ahead and recheck the a1c after 3 months. Type 2 diabetes mellitus without complication, without long-term current use of insulin ([place]) - Plan: Hemoglobin A1c  Essential hypertension  Gregory Drape, PA-C [facility] 3/17/20188:56 AM

## 2016-11-30 LAB — SPECIMEN STATUS REPORT

## 2016-12-02 ENCOUNTER — Other Ambulatory Visit: Payer: Self-pay | Admitting: Physician Assistant

## 2016-12-02 LAB — HEMOGLOBIN A1C
ESTIMATED AVERAGE GLUCOSE: 166 mg/dL
HEMOGLOBIN A1C: 7.4 % — AB (ref 4.8–5.6)

## 2017-01-29 ENCOUNTER — Other Ambulatory Visit: Payer: Self-pay | Admitting: Family Medicine

## 2017-02-21 ENCOUNTER — Encounter: Payer: Self-pay | Admitting: Physician Assistant

## 2017-02-21 ENCOUNTER — Ambulatory Visit (INDEPENDENT_AMBULATORY_CARE_PROVIDER_SITE_OTHER): Payer: Medicare Other | Admitting: Physician Assistant

## 2017-02-21 VITALS — BP 120/71 | HR 80 | Temp 98.1°F | Resp 16 | Ht 68.75 in | Wt 215.8 lb

## 2017-02-21 DIAGNOSIS — J329 Chronic sinusitis, unspecified: Secondary | ICD-10-CM

## 2017-02-21 MED ORDER — AZELASTINE HCL 0.1 % NA SOLN: 2.0000 | Freq: Two times a day (BID) | NASAL | 0 refills | Status: DC

## 2017-02-21 MED ORDER — AMOXICILLIN-POT CLAVULANATE 875-125 MG PO TABS: 1.0000 | ORAL_TABLET | Freq: Two times a day (BID) | ORAL | 0 refills | Status: AC

## 2017-02-21 NOTE — Patient Instructions (Addendum)
Take an oral antihistamine (Claritin, Allegra or Zyrtec) each day.    IF you received an x-ray today, you will receive an invoice from [facility]. Please contact [facility] at [phone] with questions or concerns regarding your invoice.   IF you received labwork today, you will receive an invoice from [place]. Please contact [facility] at [phone] with questions or concerns regarding your invoice.   Our billing staff will not be able to assist you with questions regarding bills from these companies.  You will be contacted with the lab results as soon as they are available. The fastest way to get your results is to activate your My Chart account. Instructions are located on the last page of this paperwork. If you have not heard from [place] regarding the results in 2 weeks, please contact this office.

## 2017-02-21 NOTE — Progress Notes (Signed)
Subjective:    Patient ID: Gregory Roller., male    DOB: 07-10-1946, 71 y.o.   MRN: [id number]  HPI: Patient present complaining of sinus pressure. He has had a head fullness, runny nose, nasal congestion postnasal drip, and some minor ear pain- all on the left side for about a week. He states that he also feels pressure in his left eye, and is sneezing. He has a history of seasonal allergies which he states have not been as bad this year. He tried ibuprofen with some relief but the symptoms kept coming back. He denies fever, chills, SOB, dizziness, nausea, vomiting, diarrhea and urinary symptoms. He is continuing to eat and drink okay.    Patient Active Problem List   Diagnosis Date Noted  . Cough 11/04/2016  . 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 BMI 31.0-31.9,adult 03/13/2016  . Chest pain 08/29/2015  . Bruit 08/29/2015  . Essential hypertension 08/29/2015  . Diverticulosis of colon without hemorrhage 08/01/2015  . Statin intolerance 08/01/2015  . Basal cell carcinoma of nose 08/23/2014  . Rosacea 08/23/2014  . Seasonal allergies 01/26/2013  . Diabetes mellitus type 2, uncomplicated ([place]) 03/50/0938  . Hyperlipidemia 07/09/2012  . Erectile dysfunction 07/09/2012   Prior to Admission medications   Medication Sig Start Date End Date Taking? Authorizing Provider  aspirin 81 MG tablet Take 81 mg by mouth every other day.    Yes [provider]  blood glucose meter kit and supplies Use to test blood sugar daily. Dx: E11.9 10/11/15  Yes Tereasa Coop, PA-C  glipiZIDE (GLUCOTROL XL) 5 MG 24 hr tablet Take 1-2 tablets (5-10 mg total) by mouth daily with breakfast. 10/22/16  Yes Wardell Honour, MD  Lancets MISC Use to test blood sugar daily. Dx code :E11.9 10/11/15  Yes Tereasa Coop, PA-C  losartan (COZAAR) 25 MG tablet TAKE 1 TABLET (25 MG TOTAL) BY MOUTH DAILY. 01/31/17  Yes Wardell Honour, MD  Multiple Vitamin (MULTIVITAMIN) tablet Take 1 tablet by mouth daily.   Yes  [provider]  ONE TOUCH ULTRA TEST test strip TEST BLOOD SUGAR DAILY 12/02/16  Yes Tenna Delaine D, PA-C   Allergies  Allergen Reactions  . Metformin And Related Diarrhea  . Statins Other (See Comments)    Severe leg pain    Review of Systems  Constitutional: Negative.   HENT: Positive for congestion, ear pain, postnasal drip, rhinorrhea, sinus pain, sinus pressure and sneezing.   Eyes: Positive for itching.  Respiratory: Negative.   Cardiovascular: Negative.   Gastrointestinal: Negative.   Endocrine: Negative.   Genitourinary: Negative.   Musculoskeletal: Negative.   Skin: Negative.   Allergic/Immunologic: Positive for environmental allergies.  Hematological: Negative.   Psychiatric/Behavioral: Negative.        Objective:   Physical Exam  Constitutional: He appears well-developed and well-nourished.  HENT:  Head: Normocephalic and atraumatic.  Right Ear: External ear normal.  Nose: Nose normal.  Mouth/Throat: Oropharynx is clear and moist. No oropharyngeal exudate.  Left ear with middle effusion  Eyes: Conjunctivae and EOM are normal. Pupils are equal, round, and reactive to light.  Neck: Normal range of motion. Neck supple.  Cardiovascular: Normal rate, regular rhythm, normal heart sounds and intact distal pulses.   Pulmonary/Chest: Effort normal and breath sounds normal.  Lymphadenopathy:    He has no cervical adenopathy.   BP 120/71   Pulse 80   Temp 98.1 F (36.7 C) (Oral)   Resp 16   Ht 5' 8.75" (  1.746 m)   Wt 215 lb 12.8 oz (97.9 kg)   SpO2 97%   BMI 32.10 kg/m         Assessment & Plan:  1. Sinusitis, unspecified chronicity, unspecified location - amoxicillin-clavulanate (AUGMENTIN) 875-125 MG tablet; Take 1 tablet by mouth 2 (two) times daily.  Dispense: 20 tablet; Refill: 0 - azelastine (ASTELIN) 0.1 % nasal spray; Place 2 sprays into both nostrils 2 (two) times daily. Use in each nostril as directed  Dispense: 30 mL; Refill:  0  Return for re-evaluation of diabetes, etc in the next 4 weeks, with Dr. Tamala Julian.

## 2017-02-24 NOTE — Progress Notes (Signed)
Patient ID: Gregory Mathews., male    DOB: [birth year], 71 y.o.   MRN: [id number]  PCP: Wardell Honour, MD  Chief Complaint  Patient presents with  . pressure in nose    symptoms x 1 wk  . Headache    Subjective:   Presents for evaluation of possible sinusitis.  He describes pressure and pain in the LEFT side of his face. His seasonal allergies have been less this year, and his typical treatment have been effective He has pressure on the LEFT side of the head and mild LEFT ear pain. OTC ibuprofen is temporarily effective. Runny nose and sneezing.  No dizziness, nausea, vomiting, diarrhea. No fever/chills. No cough.    Review of Systems As above. No CP, SOB.    Patient Active Problem List   Diagnosis Date Noted  . Cough 11/04/2016  . 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 BMI 31.0-31.9,adult 03/13/2016  . Chest pain 08/29/2015  . Bruit 08/29/2015  . Essential hypertension 08/29/2015  . Diverticulosis of colon without hemorrhage 08/01/2015  . Statin intolerance 08/01/2015  . Basal cell carcinoma of nose 08/23/2014  . Rosacea 08/23/2014  . Seasonal allergies 01/26/2013  . Diabetes mellitus type 2, uncomplicated ([place]) 56/38/7564  . Hyperlipidemia 07/09/2012  . Erectile dysfunction 07/09/2012     Prior to Admission medications   Medication Sig Start Date End Date Taking? Authorizing Provider  aspirin 81 MG tablet Take 81 mg by mouth every other day.    Yes [provider]  blood glucose meter kit and supplies Use to test blood sugar daily. Dx: E11.9 10/11/15  Yes Tereasa Coop, PA-C  glipiZIDE (GLUCOTROL XL) 5 MG 24 hr tablet Take 1-2 tablets (5-10 mg total) by mouth daily with breakfast. 10/22/16  Yes Wardell Honour, MD  Lancets MISC Use to test blood sugar daily. Dx code :E11.9 10/11/15  Yes Tereasa Coop, PA-C  losartan (COZAAR) 25 MG tablet TAKE 1 TABLET (25 MG TOTAL) BY MOUTH DAILY. 01/31/17  Yes Wardell Honour, MD  Multiple Vitamin  (MULTIVITAMIN) tablet Take 1 tablet by mouth daily.   Yes [provider]  ONE TOUCH ULTRA TEST test strip TEST BLOOD SUGAR DAILY 12/02/16  Yes Tenna Delaine D, PA-C  amoxicillin-clavulanate (AUGMENTIN) 875-125 MG tablet Take 1 tablet by mouth 2 (two) times daily. 02/21/17 03/03/17  Harrison Mons, PA-C  azelastine (ASTELIN) 0.1 % nasal spray Place 2 sprays into both nostrils 2 (two) times daily. Use in each nostril as directed 02/21/17   Harrison Mons, PA-C     Allergies  Allergen Reactions  . Metformin And Related Diarrhea  . Statins Other (See Comments)    Severe leg pain       Objective:  Physical Exam  Constitutional: He is oriented to person, place, and time. He appears well-developed and well-nourished. No distress.  BP 120/71   Pulse 80   Temp 98.1 F (36.7 C) (Oral)   Resp 16   Ht 5' 8.75" (1.746 m)   Wt 215 lb 12.8 oz (97.9 kg)   SpO2 97%   BMI 32.10 kg/m    HENT:  Head: Normocephalic and atraumatic.  Right Ear: Hearing, external ear and ear canal normal. No middle ear effusion.  Left Ear: Hearing, external ear and ear canal normal. A middle ear effusion (mild) is present.  Nose: Mucosal edema and rhinorrhea present.  No foreign bodies. Right sinus exhibits no maxillary sinus tenderness and no frontal sinus tenderness. Left sinus exhibits  no maxillary sinus tenderness and no frontal sinus tenderness.  Mouth/Throat: Uvula is midline, oropharynx is clear and moist and mucous membranes are normal. No uvula swelling. No oropharyngeal exudate.  Eyes: Conjunctivae and EOM are normal. Pupils are equal, round, and reactive to light. Right eye exhibits no discharge. Left eye exhibits no discharge. No scleral icterus.  Neck: Trachea normal, normal range of motion and full passive range of motion without pain. Neck supple. No thyroid mass and no thyromegaly present.  Cardiovascular: Normal rate, regular rhythm and normal heart sounds.   Pulmonary/Chest: Effort normal  and breath sounds normal.  Lymphadenopathy:       Head (right side): No submandibular, no tonsillar, no preauricular, no posterior auricular and no occipital adenopathy present.       Head (left side): No submandibular, no tonsillar, no preauricular and no occipital adenopathy present.    He has no cervical adenopathy.       Right: No supraclavicular adenopathy present.       Left: No supraclavicular adenopathy present.  Neurological: He is alert and oriented to person, place, and time. He has normal strength. No cranial nerve deficit or sensory deficit.  Skin: Skin is warm, dry and intact. No rash noted.  Psychiatric: He has a normal mood and affect. His speech is normal and behavior is normal.           Assessment & Plan:   1. Sinusitis, unspecified chronicity, unspecified location Supportive care.  Anticipatory guidance.  RTC if symptoms worsen/persist. - amoxicillin-clavulanate (AUGMENTIN) 875-125 MG tablet; Take 1 tablet by mouth 2 (two) times daily.  Dispense: 20 tablet; Refill: 0 - azelastine (ASTELIN) 0.1 % nasal spray; Place 2 sprays into both nostrils 2 (two) times daily. Use in each nostril as directed  Dispense: 30 mL; Refill: 0    Return for re-evaluation of diabetes, etc in the next 4 weeks, with Dr. Tamala Julian.   Fara Chute, PA-C [facility]

## 2017-03-21 ENCOUNTER — Other Ambulatory Visit: Payer: Self-pay | Admitting: Physician Assistant

## 2017-03-21 DIAGNOSIS — J329 Chronic sinusitis, unspecified: Secondary | ICD-10-CM

## 2017-04-02 ENCOUNTER — Ambulatory Visit: Payer: Medicare Other | Admitting: Family Medicine

## 2017-04-05 ENCOUNTER — Ambulatory Visit (INDEPENDENT_AMBULATORY_CARE_PROVIDER_SITE_OTHER): Payer: Medicare Other | Admitting: Emergency Medicine

## 2017-04-05 ENCOUNTER — Encounter: Payer: Self-pay | Admitting: Emergency Medicine

## 2017-04-05 ENCOUNTER — Ambulatory Visit (INDEPENDENT_AMBULATORY_CARE_PROVIDER_SITE_OTHER): Payer: Medicare Other

## 2017-04-05 VITALS — BP 135/71 | HR 70 | Temp 98.5°F | Resp 16 | Ht 68.75 in | Wt 218.0 lb

## 2017-04-05 DIAGNOSIS — R079 Chest pain, unspecified: Secondary | ICD-10-CM | POA: Diagnosis not present

## 2017-04-05 DIAGNOSIS — R040 Epistaxis: Secondary | ICD-10-CM | POA: Diagnosis not present

## 2017-04-05 MED ORDER — OXYMETAZOLINE HCL 0.05 % NA SOLN: 1.0000 | Freq: Two times a day (BID) | NASAL | 0 refills | Status: DC

## 2017-04-05 MED ORDER — TRIAMCINOLONE ACETONIDE 55 MCG/ACT NA AERO: 2.0000 | INHALATION_SPRAY | Freq: Every day | NASAL | 12 refills | Status: DC

## 2017-04-05 NOTE — Progress Notes (Signed)
Gregory Mathews. 71 y.o.   Chief Complaint  Patient presents with  . Epistaxis    Heavy nosebleed Wednesday and last night   . Chest Pain    Rib pain on right side with movement/exercise    HISTORY OF PRESENT ILLNESS: This is a 71 y.o. male complaining of heavy nosebleed last night and the day before; controlled with pressure; also has intermittent pain to right front lower rib cage worse with movement x several months. No associated symptoms.Marland Kitchen  HPI   Prior to Admission medications   Medication Sig Start Date End Date Taking? Authorizing Provider  aspirin 81 MG tablet Take 81 mg by mouth every other day.    Yes [provider]  blood glucose meter kit and supplies Use to test blood sugar daily. Dx: E11.9 10/11/15  Yes Tereasa Coop, PA-C  glipiZIDE (GLUCOTROL XL) 5 MG 24 hr tablet Take 1-2 tablets (5-10 mg total) by mouth daily with breakfast. 10/22/16  Yes Wardell Honour, MD  Lancets MISC Use to test blood sugar daily. Dx code :E11.9 10/11/15  Yes Tereasa Coop, PA-C  losartan (COZAAR) 25 MG tablet TAKE 1 TABLET (25 MG TOTAL) BY MOUTH DAILY. 01/31/17  Yes Wardell Honour, MD  Multiple Vitamin (MULTIVITAMIN) tablet Take 1 tablet by mouth daily.   Yes [provider]  ONE TOUCH ULTRA TEST test strip TEST BLOOD SUGAR DAILY 12/02/16  Yes Timmothy Euler, [place] D, PA-C  azelastine (ASTELIN) 0.1 % nasal spray PLACE 2 SPRAYS INTO BOTH NOSTRILS 2 TIMES DAILY. USE IN [facility] NOSTRIL AS DIRECTED Patient not taking: Reported on 04/05/2017 03/21/17   Wardell Honour, MD  oxymetazoline [facility] NASAL SPRAY) 0.05 % nasal spray Place 1 spray into both nostrils 2 (two) times daily. Only as needed. 04/05/17   Horald Pollen, MD  triamcinolone (NASACORT) 55 MCG/ACT AERO nasal inhaler Place 2 sprays into the nose daily. 04/05/17   Horald Pollen, MD    Allergies  Allergen Reactions  . Metformin And Related Diarrhea  . Statins Other (See Comments)    Severe leg pain     Patient Active Problem List   Diagnosis Date Noted  . Cough 11/04/2016  . 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 BMI 31.0-31.9,adult 03/13/2016  . Chest pain 08/29/2015  . Bruit 08/29/2015  . Essential hypertension 08/29/2015  . Diverticulosis of colon without hemorrhage 08/01/2015  . Statin intolerance 08/01/2015  . Basal cell carcinoma of nose 08/23/2014  . Rosacea 08/23/2014  . Seasonal allergies 01/26/2013  . Diabetes mellitus type 2, uncomplicated ([place]) 29/52/8413  . Hyperlipidemia 07/09/2012  . Erectile dysfunction 07/09/2012    Past Medical History:  Diagnosis Date  . Allergy   . Basal cell carcinoma 01/2013   Nasal and R forearm.  [facility]  . Cataract   . Diabetes mellitus without complication ([place])   . Hyperlipidemia   . Rosacea     Past Surgical History:  Procedure Laterality Date  . basal cell removal     nose and wrist   . BELPHAROPTOSIS REPAIR    . EYE SURGERY     Cataract surgery  . eyelid surgery    . SHOULDER OPEN ROTATOR CUFF REPAIR  1983    Social History   Social History  . Marital status: Married    Spouse name: [name]  . Number of children: 4  . Years of education: 20   Occupational History  . bus driver/transportation [employer]  Topics  . Smoking status: Former Smoker    Packs/day: 1.00    Years: 15.00    Quit date: 07/09/1982  . Smokeless tobacco: Never Used  . Alcohol use No  . Drug use: No  . Sexual activity: Yes    Partners: Female   Other Topics Concern  . Not on file   Social History Narrative   Marital status: married x 75 years; happily married.       Children:  4 children; 4 grandchildren. Adult children all live locally.      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      Tobacco:  In past; smoked x 15 years; quit 35 years ago.      Alcohol: none      Exercise:sporadic in 2017.       ADLs: independent with all ADLs.  Does not use assistant device with ambulation.      Living Will:  Has living will; desires FULL CODE; no prolonged measures.      Doctoral degree in Biblical Studies.        Family History  Problem Relation Age of Onset  . Cancer Mother        leukemia  . Stroke Mother   . Heart disease Father   . Stroke Father   . Colon cancer Father 39  . Cancer Father 25       Colon cancer  . Diabetes Sister   . Heart disease Brother   . Cancer Maternal Grandmother   . Stroke Maternal Grandfather   . Diabetes Brother   . Diabetes Brother   . Cancer Brother        colon cancer     Review of Systems  Constitutional: Negative.  Negative for chills and fever.  HENT: Positive for nosebleeds. Negative for congestion and sore throat.   Eyes: Negative for blurred vision and double vision.  Respiratory: Negative for cough and shortness of breath.   Cardiovascular: Positive for chest pain (right sided). Negative for palpitations and leg swelling.  Gastrointestinal: Negative for abdominal pain, diarrhea, nausea and vomiting.  Musculoskeletal: Negative for back pain, myalgias and neck pain.  Skin: Negative.  Negative for rash.  Neurological: Negative for dizziness and headaches.  Endo/Heme/Allergies: Negative.   All other systems reviewed and are negative.   Vitals:   04/05/17 0955  BP: 135/71  Pulse: 70  Resp: 16  Temp: 98.5 F (36.9 C)    Physical Exam  Constitutional: He is oriented to person, place, and time. He appears well-developed and well-nourished.  HENT:  Head: Normocephalic and atraumatic.  Nose: Nose normal.  Mouth/Throat: Oropharynx is clear and moist.  Eyes: Pupils are equal, round, and reactive to light. Conjunctivae and EOM are normal.  Neck: Normal range of motion. Neck supple. No JVD present.  Cardiovascular: Normal rate, regular rhythm, normal heart sounds and intact distal pulses.   Pulmonary/Chest: Effort normal and breath  sounds normal. He exhibits no tenderness.  Abdominal: Soft. Bowel sounds are normal. He exhibits no distension. There is no tenderness.  Musculoskeletal: Normal range of motion.  Lymphadenopathy:    He has no cervical adenopathy.  Neurological: He is alert and oriented to person, place, and time. He exhibits normal muscle tone.  Skin: Skin is warm and dry. Capillary refill takes less than 2 seconds. No rash noted.  Psychiatric: He has a normal mood and affect. His  behavior is normal.  Vitals reviewed.  Dg Chest 2 View  Result Date: 04/05/2017 CLINICAL DATA:  Chest pain, worse with movement. EXAM: CHEST  2 VIEW COMPARISON:  08/30/2016. FINDINGS: Normal sized heart. Clear lungs. Stable mild diffuse peribronchial thickening. Minimal thoracic spine degenerative changes. IMPRESSION: No acute abnormality.  Stable mild chronic bronchitic changes. Electronically Signed   By: Claudie Revering M.D.   On: 04/05/2017 10:40    ASSESSMENT & PLAN: Ricardo was seen today for epistaxis and chest pain.  Diagnoses and all orders for this visit:  Chest pain, unspecified type -     DG Chest 2 View; Future  Epistaxis  Other orders -     triamcinolone (NASACORT) 55 MCG/ACT AERO nasal inhaler; Place 2 sprays into the nose daily. -     oxymetazoline (AFRIN NASAL SPRAY) 0.05 % nasal spray; Place 1 spray into both nostrils 2 (two) times daily. Only as needed.    Patient Instructions  Nosebleed A nosebleed is when blood comes out of the nose. Nosebleeds are common. They are usually not a sign of a serious medical condition. Follow these instructions at home:  Try controlling your nosebleed by pinching your nostrils gently. Do this for at least 10 minutes.  Avoid blowing or sniffing your nose for a number of hours after having a nosebleed.  Do not put gauze inside of your nose yourself. If your nose was packed by your doctor, try to keep the pack inside of your nose until your doctor removes it. ? If a gauze  pack was used and it starts to fall out, gently replace it or cut off the end of it. ? If a balloon catheter was used to pack your nose, do not cut or remove it unless told by your doctor.  Avoid lying down while you are having a nosebleed. Sit up and lean forward.  Use a nasal spray decongestant to help with a nosebleed as told by your doctor.  Do not use petroleum jelly or mineral oil in your nose. These can drip into your lungs.  Keep your house humid by using: ? Less air conditioning. ? A humidifier.  Aspirin and blood thinners make bleeding more likely. If you are prescribed these medicines and you have nosebleeds, ask your doctor if you should stop taking the medicines or adjust the dose. Do not stop medicines unless told by your doctor.  Resume your normal activities as you are able. Avoid straining, lifting, or bending at your waist for several days.  If your nosebleed was caused by dryness in your nose, use over-the-counter saline nasal spray or gel. If you must use a lubricant: ? Choose one that is water-soluble. ? Use it only as needed. ? Do not use it within several hours of lying down.  Keep all follow-up visits as told by your doctor. This is important. Contact a health care provider if:  You have a fever.  You get frequent nosebleeds.  You are getting nosebleeds more often. Get help right away if:  Your nosebleed lasts longer than 20 minutes.  Your nosebleed occurs after an injury to your face, and your nose looks crooked or broken.  You have unusual bleeding from other parts of your body.  You have unusual bruising on other parts of your body.  You feel light-headed or dizzy.  You become sweaty.  You throw up (vomit) blood.  You have a nosebleed after a head injury. This information is not intended to replace advice given  to you by your health care provider. Make sure you discuss any questions you have with your health care provider. Document Released:  06/18/2008 Document Revised: 04/12/2016 Document Reviewed: 04/25/2014 Elsevier Interactive Patient Education  2018 [place].  Chest Wall Pain Chest wall pain is pain in or around the bones and muscles of your chest. Sometimes, an injury causes this pain. Sometimes, the cause may not be known. This pain may take several weeks or longer to get better. Follow these instructions at home: Pay attention to any changes in your symptoms. Take these actions to help with your pain:  Rest as told by your doctor.  Avoid activities that cause pain. Try not to use your chest, belly (abdominal), or side muscles to lift heavy things.  If directed, apply ice to the painful area: ? Put ice in a plastic bag. ? Place a towel between your skin and the bag. ? Leave the ice on for 20 minutes, 2-3 times per day.  Take over-the-counter and prescription medicines only as told by your doctor.  Do not use tobacco products, including cigarettes, chewing tobacco, and e-cigarettes. If you need help quitting, ask your doctor.  Keep all follow-up visits as told by your doctor. This is important.  Contact a doctor if:  You have a fever.  Your chest pain gets worse.  You have new symptoms. Get help right away if:  You feel sick to your stomach (nauseous) or you throw up (vomit).  You feel sweaty or light-headed.  You have a cough with phlegm (sputum) or you cough up blood.  You are short of breath. This information is not intended to replace advice given to you by your health care provider. Make sure you discuss any questions you have with your health care provider. Document Released: 02/26/2008 Document Revised: 02/15/2016 Document Reviewed: 12/05/2014 Elsevier Interactive Patient Education  2018 Elsevier Inc.     Agustina Caroli, MD [facility]

## 2017-04-05 NOTE — Patient Instructions (Addendum)
Nosebleed A nosebleed is when blood comes out of the nose. Nosebleeds are common. They are usually not a sign of a serious medical condition. Follow these instructions at home:  Try controlling your nosebleed by pinching your nostrils gently. Do this for at least 10 minutes.  Avoid blowing or sniffing your nose for a number of hours after having a nosebleed.  Do not put gauze inside of your nose yourself. If your nose was packed by your doctor, try to keep the pack inside of your nose until your doctor removes it. ? If a gauze pack was used and it starts to fall out, gently replace it or cut off the end of it. ? If a balloon catheter was used to pack your nose, do not cut or remove it unless told by your doctor.  Avoid lying down while you are having a nosebleed. Sit up and lean forward.  Use a nasal spray decongestant to help with a nosebleed as told by your doctor.  Do not use petroleum jelly or mineral oil in your nose. These can drip into your lungs.  Keep your house humid by using: ? Less air conditioning. ? A humidifier.  Aspirin and blood thinners make bleeding more likely. If you are prescribed these medicines and you have nosebleeds, ask your doctor if you should stop taking the medicines or adjust the dose. Do not stop medicines unless told by your doctor.  Resume your normal activities as you are able. Avoid straining, lifting, or bending at your waist for several days.  If your nosebleed was caused by dryness in your nose, use over-the-counter saline nasal spray or gel. If you must use a lubricant: ? Choose one that is water-soluble. ? Use it only as needed. ? Do not use it within several hours of lying down.  Keep all follow-up visits as told by your doctor. This is important. Contact a health care provider if:  You have a fever.  You get frequent nosebleeds.  You are getting nosebleeds more often. Get help right away if:  Your nosebleed lasts longer than 20  minutes.  Your nosebleed occurs after an injury to your face, and your nose looks crooked or broken.  You have unusual bleeding from other parts of your body.  You have unusual bruising on other parts of your body.  You feel light-headed or dizzy.  You become sweaty.  You throw up (vomit) blood.  You have a nosebleed after a head injury. This information is not intended to replace advice given to you by your health care provider. Make sure you discuss any questions you have with your health care provider. Document Released: 06/18/2008 Document Revised: 04/12/2016 Document Reviewed: 04/25/2014 Elsevier Interactive Patient Education  2018 [place].  Chest Wall Pain Chest wall pain is pain in or around the bones and muscles of your chest. Sometimes, an injury causes this pain. Sometimes, the cause may not be known. This pain may take several weeks or longer to get better. Follow these instructions at home: Pay attention to any changes in your symptoms. Take these actions to help with your pain:  Rest as told by your doctor.  Avoid activities that cause pain. Try not to use your chest, belly (abdominal), or side muscles to lift heavy things.  If directed, apply ice to the painful area: ? Put ice in a plastic bag. ? Place a towel between your skin and the bag. ? Leave the ice on for 20 minutes, 2-3 times  per day.  Take over-the-counter and prescription medicines only as told by your doctor.  Do not use tobacco products, including cigarettes, chewing tobacco, and e-cigarettes. If you need help quitting, ask your doctor.  Keep all follow-up visits as told by your doctor. This is important.  Contact a doctor if:  You have a fever.  Your chest pain gets worse.  You have new symptoms. Get help right away if:  You feel sick to your stomach (nauseous) or you throw up (vomit).  You feel sweaty or light-headed.  You have a cough with phlegm (sputum) or you cough up  blood.  You are short of breath. This information is not intended to replace advice given to you by your health care provider. Make sure you discuss any questions you have with your health care provider. Document Released: 02/26/2008 Document Revised: 02/15/2016 Document Reviewed: 12/05/2014 Elsevier Interactive Patient Education  [employer].

## 2017-04-22 ENCOUNTER — Other Ambulatory Visit: Payer: Self-pay | Admitting: Physician Assistant

## 2017-05-07 ENCOUNTER — Ambulatory Visit (INDEPENDENT_AMBULATORY_CARE_PROVIDER_SITE_OTHER): Payer: Medicare Other | Admitting: Family Medicine

## 2017-05-07 ENCOUNTER — Telehealth: Payer: Self-pay | Admitting: Family Medicine

## 2017-05-07 ENCOUNTER — Encounter: Payer: Self-pay | Admitting: Family Medicine

## 2017-05-07 VITALS — BP 128/70 | HR 68 | Temp 98.0°F | Resp 18 | Ht 69.29 in | Wt 214.0 lb

## 2017-05-07 DIAGNOSIS — E119 Type 2 diabetes mellitus without complications: Secondary | ICD-10-CM

## 2017-05-07 DIAGNOSIS — E7800 Pure hypercholesterolemia, unspecified: Secondary | ICD-10-CM

## 2017-05-07 DIAGNOSIS — Z23 Encounter for immunization: Secondary | ICD-10-CM

## 2017-05-07 DIAGNOSIS — I10 Essential (primary) hypertension: Secondary | ICD-10-CM

## 2017-05-07 DIAGNOSIS — Z789 Other specified health status: Secondary | ICD-10-CM | POA: Diagnosis not present

## 2017-05-07 LAB — GLUCOSE, POCT (MANUAL RESULT ENTRY): POC Glucose: 103 mg/dl — AB (ref 70–99)

## 2017-05-07 LAB — POCT GLYCOSYLATED HEMOGLOBIN (HGB A1C): Hemoglobin A1C: 7.1

## 2017-05-07 MED ORDER — LOSARTAN POTASSIUM 25 MG PO TABS: 25.0000 mg | ORAL_TABLET | Freq: Every day | ORAL | 1 refills | Status: DC

## 2017-05-07 MED ORDER — ZOSTER VAC RECOMB ADJUVANTED 50 MCG/0.5ML IM SUSR: 0.5000 mL | Freq: Once | INTRAMUSCULAR | 1 refills | Status: AC

## 2017-05-07 MED ORDER — GLIPIZIDE 5 MG PO TABS: 5.0000 mg | ORAL_TABLET | Freq: Two times a day (BID) | ORAL | 1 refills | Status: DC

## 2017-05-07 NOTE — Progress Notes (Signed)
Subjective:    Patient ID: Gregory Mathews., male    DOB: 20-Jan-1946, 71 y.o.   MRN: [id number]  05/07/2017  Diabetes (3 month follow-up)   HPI This 71 y.o. male presents for evaluation of DMII, hypertension, hypercholesterolemia.  Patient reports good compliance with medication, good tolerance to medication, and good symptom control.   Sugars running 120s on average.  Exercising qod.  Weight lifting and some cardio. Not checking blood pressure at home.  Sugars have been labile.  Sister died last month.  Sister with DMII and thinks had AMI as cause of death. Father with AMI/CABG twenty years ago.   Last eye exam in past six months. Tried leaf supplement that decreased sugars and choelsterol yet suffered with horrible constipation.  Kale, broccoli. Omega 3.  Half dose. CoQ10.  Half dose. Magnesium.  Half dose. Niacin.  Half dose. Garlic. Half dose.  BP Readings from Last 3 Encounters:  05/07/17 128/70  04/05/17 135/71  02/21/17 120/71   Wt Readings from Last 3 Encounters:  05/07/17 214 lb (97.1 kg)  04/05/17 218 lb (98.9 kg)  02/21/17 215 lb 12.8 oz (97.9 kg)   Immunization History  Administered Date(s) Administered  . Influenza Split 07/09/2012  . Influenza,inj,Quad PF,36+ Mos 07/20/2013, 07/19/2014, 08/01/2015, 08/14/2016  . Pneumococcal Conjugate-13 08/22/2014  . Pneumococcal Polysaccharide-23 07/09/2012  . Tdap 08/01/2015  . Zoster 09/23/2005    Review of Systems  Constitutional: Negative for activity change, appetite change, chills, diaphoresis, fatigue and fever.  Respiratory: Negative for cough and shortness of breath.   Cardiovascular: Negative for chest pain, palpitations and leg swelling.  Gastrointestinal: Negative for abdominal pain, diarrhea, nausea and vomiting.  Endocrine: Negative for cold intolerance, heat intolerance, polydipsia, polyphagia and polyuria.  Skin: Negative for color change, rash and wound.  Neurological: Negative for dizziness,  tremors, seizures, syncope, facial asymmetry, speech difficulty, weakness, light-headedness, numbness and headaches.  Psychiatric/Behavioral: Negative for dysphoric mood and sleep disturbance. The patient is not nervous/anxious.     Past Medical History:  Diagnosis Date  . Allergy   . Basal cell carcinoma 01/2013   Nasal and R forearm.  [facility]  . Cataract   . Diabetes mellitus without complication ([place])   . Hyperlipidemia   . Rosacea    Past Surgical History:  Procedure Laterality Date  . basal cell removal     nose and wrist   . BELPHAROPTOSIS REPAIR    . EYE SURGERY     Cataract surgery  . eyelid surgery    . SHOULDER OPEN ROTATOR CUFF REPAIR  1983   Allergies  Allergen Reactions  . Metformin And Related Diarrhea  . Statins Other (See Comments)    Severe leg pain    Social History   Social History  . Marital status: Married    Spouse name: [name]  . Number of children: 4  . Years of education: 20   Occupational History  . 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 Smoking status: Former Smoker    Packs/day: 1.00    Years: 15.00    Quit date: 07/09/1982  . Smokeless tobacco: Never Used  . Alcohol use No  . Drug use: No  . Sexual activity: Yes    Partners: Female   Other Topics Concern  . Not on file   Social History Narrative   Marital status: married x 55 years; happily married.       Children:  4 children; 4 grandchildren. Adult  children all live locally.      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      Tobacco:  In past; smoked x 15 years; quit 35 years ago.      Alcohol: none      Exercise:sporadic in 2017.      ADLs: independent with all ADLs.  Does not use assistant device with ambulation.      Living Will:  Has living will; desires FULL CODE; no prolonged measures.      Doctoral degree in Biblical  Studies.       Family History  Problem Relation Age of Onset  . Cancer Mother        leukemia  . Stroke Mother   . Heart disease Father   . Stroke Father   . Colon cancer Father 3  . Cancer Father 28       Colon cancer  . Diabetes Sister   . Heart disease Brother   . Cancer Maternal Grandmother   . Stroke Maternal Grandfather   . Diabetes Brother   . Diabetes Brother   . Cancer Brother        colon cancer       Objective:    BP 128/70   Pulse 68   Temp 98 F (36.7 C) (Oral)   Resp 18   Ht 5' 9.29" (1.76 m)   Wt 214 lb (97.1 kg)   SpO2 96%   BMI 31.34 kg/m  Physical Exam  Constitutional: He is oriented to person, place, and time. He appears well-developed and well-nourished. No distress.  HENT:  Head: Normocephalic and atraumatic.  Right Ear: External ear normal.  Left Ear: External ear normal.  Nose: Nose normal.  Mouth/Throat: Oropharynx is clear and moist.  Eyes: Pupils are equal, round, and reactive to light. Conjunctivae and EOM are normal.  Neck: Normal range of motion. Neck supple. Carotid bruit is not present. No thyromegaly present.  Cardiovascular: Normal rate, regular rhythm, normal heart sounds and intact distal pulses.  Exam reveals no gallop and no friction rub.   No murmur heard. Pulmonary/Chest: Effort normal and breath sounds normal. He has no wheezes. He has no rales.  Abdominal: Soft. Bowel sounds are normal. He exhibits no distension and no mass. There is no tenderness. There is no rebound and no guarding.  Lymphadenopathy:    He has no cervical adenopathy.  Neurological: He is alert and oriented to person, place, and time. No cranial nerve deficit.  Skin: Skin is warm and dry. No rash noted. He is not diaphoretic.  Psychiatric: He has a normal mood and affect. His behavior is normal.  Nursing note and vitals reviewed.  Results for orders placed or performed in visit on 05/07/17  POCT glucose (manual entry)  Result Value Ref Range   POC  Glucose 103 (A) 70 - 99 mg/dl  POCT glycosylated hemoglobin (Hb A1C)  Result Value Ref Range   Hemoglobin A1C 7.1    No results found. Depression screen [facility] 2/9 05/07/2017 04/05/2017 02/21/2017 11/29/2016 10/22/2016  Decreased Interest 0 0 0 0 0  Down, Depressed, Hopeless 0 0 0 0 0  PHQ - 2 Score 0 0 0 0 0   Fall Risk  05/07/2017 04/05/2017 02/21/2017 11/29/2016 10/22/2016  Falls in the past year? No No No No No        Assessment & Plan:   1. Type  2 diabetes mellitus without complication, without long-term current use of insulin ([place])   2. Essential hypertension   3. Pure hypercholesterolemia    -moderately controlled DMII, hypertension.   -obtain labs for chronic disease management. -agree to switch patient from XL Glipizide to Glipizide short acting per his request.   Orders Placed This Encounter  Procedures  . CBC with Differential/Platelet  . Comprehensive metabolic panel    Order Specific Question:   Has the patient fasted?    Answer:   Yes  . Lipid panel    Order Specific Question:   Has the patient fasted?    Answer:   Yes  . POCT glucose (manual entry)  . POCT glycosylated hemoglobin (Hb A1C)   Meds ordered this encounter  Medications  . glipiZIDE (GLUCOTROL) 5 MG tablet    Sig: Take 1 tablet (5 mg total) by mouth 2 (two) times daily before a meal.    Dispense:  180 tablet    Refill:  1  . losartan (COZAAR) 25 MG tablet    Sig: Take 1 tablet (25 mg total) by mouth daily.    Dispense:  90 tablet    Refill:  1  . Zoster Vac Recomb Adjuvanted [facility]) injection    Sig: Inject 0.5 mLs into the muscle once.    Dispense:  0.5 mL    Refill:  1    Return in about 6 months (around 11/07/2017) for complete physical examiniation.   Ahana Najera Elayne Guerin, M.D. Primary [facility] at [facility] [address] [place], [place]  [address] [phone] phone [phone] fax

## 2017-05-07 NOTE — Patient Instructions (Addendum)
   IF you received an x-ray today, you will receive an invoice from [facility]. Please contact [facility] at [phone] with questions or concerns regarding your invoice.   IF you received labwork today, you will receive an invoice from [facility]. Please contact [facility] at [phone] with questions or concerns regarding your invoice.   Our billing staff will not be able to assist you with questions regarding bills from these companies.  You will be contacted with the lab results as soon as they are available. The fastest way to get your results is to activate your My Chart account. Instructions are located on the last page of this paperwork. If you have not heard from us regarding the results in 2 weeks, please contact this office.      Carbohydrate Counting for Diabetes Mellitus, Adult Carbohydrate counting is a method for keeping track of how many carbohydrates you eat. Eating carbohydrates naturally increases the amount of sugar (glucose) in the blood. Counting how many carbohydrates you eat helps keep your blood glucose within normal limits, which helps you manage your diabetes (diabetes mellitus). It is important to know how many carbohydrates you can safely have in each meal. This is different for every person. A diet and nutrition specialist (registered dietitian) can help you make a meal plan and calculate how many carbohydrates you should have at each meal and snack. Carbohydrates are found in the following foods:  Grains, such as breads and cereals.  Dried beans and soy products.  Starchy vegetables, such as potatoes, peas, and corn.  Fruit and fruit juices.  Milk and yogurt.  Sweets and snack foods, such as cake, cookies, candy, chips, and soft drinks.  How do I count carbohydrates? There are two ways to count carbohydrates in food. You can use either of the methods or a combination of both. Reading "Nutrition Facts" on packaged food The  "Nutrition Facts" list is included on the labels of almost all packaged foods and beverages in the [place] It includes:  The serving size.  Information about nutrients in each serving, including the grams (g) of carbohydrate per serving.  To use the "Nutrition Facts":  Decide how many servings you will have.  Multiply the number of servings by the number of carbohydrates per serving.  The resulting number is the total amount of carbohydrates that you will be having.  Learning standard serving sizes of other foods When you eat foods containing carbohydrates that are not packaged or do not include "Nutrition Facts" on the label, you need to measure the servings in order to count the amount of carbohydrates:  Measure the foods that you will eat with a food scale or measuring cup, if needed.  Decide how many standard-size servings you will eat.  Multiply the number of servings by 15. Most carbohydrate-rich foods have about 15 g of carbohydrates per serving. ? For example, if you eat 8 oz (170 g) of strawberries, you will have eaten 2 servings and 30 g of carbohydrates (2 servings x 15 g = 30 g).  For foods that have more than one food mixed, such as soups and casseroles, you must count the carbohydrates in each food that is included.  The following list contains standard serving sizes of common carbohydrate-rich foods. Each of these servings has about 15 g of carbohydrates:   hamburger bun or  English muffin.   oz (15 mL) syrup.   oz (14 g) jelly.  1 slice of bread.  1 six-inch tortilla.    3 oz (85 g) cooked rice or pasta.  4 oz (113 g) cooked dried beans.  4 oz (113 g) starchy vegetable, such as peas, corn, or potatoes.  4 oz (113 g) hot cereal.  4 oz (113 g) mashed potatoes or  of a large baked potato.  4 oz (113 g) canned or frozen fruit.  4 oz (120 mL) fruit juice.  4-6 crackers.  6 chicken nuggets.  6 oz (170 g) unsweetened dry cereal.  6 oz (170 g) plain  fat-free yogurt or yogurt sweetened with artificial sweeteners.  8 oz (240 mL) milk.  8 oz (170 g) fresh fruit or one small piece of fruit.  24 oz (680 g) popped popcorn.  Example of carbohydrate counting Sample meal  3 oz (85 g) chicken breast.  6 oz (170 g) brown rice.  4 oz (113 g) corn.  8 oz (240 mL) milk.  8 oz (170 g) strawberries with sugar-free whipped topping. Carbohydrate calculation 1. Identify the foods that contain carbohydrates: ? Rice. ? Corn. ? Milk. ? Strawberries. 2. Calculate how many servings you have of each food: ? 2 servings rice. ? 1 serving corn. ? 1 serving milk. ? 1 serving strawberries. 3. Multiply each number of servings by 15 g: ? 2 servings rice x 15 g = 30 g. ? 1 serving corn x 15 g = 15 g. ? 1 serving milk x 15 g = 15 g. ? 1 serving strawberries x 15 g = 15 g. 4. Add together all of the amounts to find the total grams of carbohydrates eaten: ? 30 g + 15 g + 15 g + 15 g = 75 g of carbohydrates total. This information is not intended to replace advice given to you by your health care provider. Make sure you discuss any questions you have with your health care provider. Document Released: 09/09/2005 Document Revised: 03/29/2016 Document Reviewed: 02/21/2016 Elsevier Interactive Patient Education  2018 [employer]  

## 2017-05-07 NOTE — Telephone Encounter (Signed)
[facility] is calling about a e scribed rx that was just sent over for this patient on Glipizide  Best number [phone]

## 2017-05-08 LAB — COMPREHENSIVE METABOLIC PANEL
A/G RATIO: 1.6 (ref 1.2–2.2)
ALT: 27 IU/L (ref 0–44)
AST: 28 IU/L (ref 0–40)
Albumin: 4.6 g/dL (ref 3.6–4.8)
Alkaline Phosphatase: 54 IU/L (ref 39–117)
BUN/Creatinine Ratio: 26 — ABNORMAL HIGH (ref 10–24)
BUN: 21 mg/dL (ref 8–27)
Bilirubin Total: 0.7 mg/dL (ref 0.0–1.2)
CALCIUM: 10 mg/dL (ref 8.6–10.2)
CO2: 22 mmol/L (ref 20–29)
CREATININE: 0.82 mg/dL (ref 0.76–1.27)
Chloride: 103 mmol/L (ref 96–106)
GFR, EST AFRICAN AMERICAN: 104 mL/min/{1.73_m2} (ref >59)
GFR, EST NON AFRICAN AMERICAN: 90 mL/min/{1.73_m2} (ref >59)
GLOBULIN, TOTAL: 2.8 g/dL (ref 1.5–4.5)
Glucose: 107 mg/dL — ABNORMAL HIGH (ref 65–99)
POTASSIUM: 4.4 mmol/L (ref 3.5–5.2)
SODIUM: 140 mmol/L (ref 134–144)
TOTAL PROTEIN: 7.4 g/dL (ref 6.0–8.5)

## 2017-05-08 LAB — CBC WITH DIFFERENTIAL/PLATELET
BASOS: 1 %
Basophils Absolute: 0 10*3/uL (ref 0.0–0.2)
EOS (ABSOLUTE): 0.2 10*3/uL (ref 0.0–0.4)
EOS: 3 %
HEMATOCRIT: 39.2 % (ref 37.5–51.0)
Hemoglobin: 13.6 g/dL (ref 13.0–17.7)
IMMATURE GRANS (ABS): 0 10*3/uL (ref 0.0–0.1)
IMMATURE GRANULOCYTES: 0 %
LYMPHS: 28 %
Lymphocytes Absolute: 2.2 10*3/uL (ref 0.7–3.1)
MCH: 30.8 pg (ref 26.6–33.0)
MCHC: 34.7 g/dL (ref 31.5–35.7)
MCV: 89 fL (ref 79–97)
MONOCYTES: 6 %
Monocytes Absolute: 0.5 10*3/uL (ref 0.1–0.9)
NEUTROS PCT: 62 %
Neutrophils Absolute: 4.8 10*3/uL (ref 1.4–7.0)
Platelets: 215 10*3/uL (ref 150–379)
RBC: 4.42 x10E6/uL (ref 4.14–5.80)
RDW: 14.1 % (ref 12.3–15.4)
WBC: 7.7 10*3/uL (ref 3.4–10.8)

## 2017-05-08 LAB — LIPID PANEL
CHOL/HDL RATIO: 4.7 ratio (ref 0.0–5.0)
Cholesterol, Total: 183 mg/dL (ref 100–199)
HDL: 39 mg/dL — AB (ref >39)
LDL CALC: 121 mg/dL — AB (ref 0–99)
TRIGLYCERIDES: 114 mg/dL (ref 0–149)
VLDL Cholesterol Cal: 23 mg/dL (ref 5–40)

## 2017-05-15 NOTE — Telephone Encounter (Signed)
Resolved

## 2017-06-24 ENCOUNTER — Other Ambulatory Visit: Payer: Self-pay | Admitting: Physician Assistant

## 2017-08-22 ENCOUNTER — Other Ambulatory Visit: Payer: Self-pay

## 2017-08-22 ENCOUNTER — Ambulatory Visit: Payer: Medicare Other

## 2017-08-22 ENCOUNTER — Ambulatory Visit (INDEPENDENT_AMBULATORY_CARE_PROVIDER_SITE_OTHER): Payer: Medicare Other | Admitting: Emergency Medicine

## 2017-08-22 ENCOUNTER — Encounter: Payer: Self-pay | Admitting: Emergency Medicine

## 2017-08-22 VITALS — BP 110/62 | HR 69 | Temp 98.3°F | Resp 16 | Ht 68.25 in | Wt 215.2 lb

## 2017-08-22 DIAGNOSIS — Z8679 Personal history of other diseases of the circulatory system: Secondary | ICD-10-CM

## 2017-08-22 DIAGNOSIS — Z0000 Encounter for general adult medical examination without abnormal findings: Secondary | ICD-10-CM | POA: Diagnosis not present

## 2017-08-22 DIAGNOSIS — R079 Chest pain, unspecified: Secondary | ICD-10-CM

## 2017-08-22 LAB — POC HEMOCCULT BLD/STL (OFFICE/1-CARD/DIAGNOSTIC): Fecal Occult Blood, POC: NEGATIVE

## 2017-08-22 NOTE — Patient Instructions (Addendum)
   IF you received an x-ray today, you will receive an invoice from [facility]. Please contact [facility] at [phone] with questions or concerns regarding your invoice.   IF you received labwork today, you will receive an invoice from [facility]. Please contact [facility] at [phone] with questions or concerns regarding your invoice.   Our billing staff will not be able to assist you with questions regarding bills from these companies.  You will be contacted with the lab results as soon as they are available. The fastest way to get your results is to activate your My Chart account. Instructions are located on the last page of this paperwork. If you have not heard from us regarding the results in 2 weeks, please contact this office.      Health Maintenance, Male A healthy lifestyle and preventive care is important for your health and wellness. Ask your health care provider about what schedule of regular examinations is right for you. What should I know about weight and diet? Eat a Healthy Diet  Eat plenty of vegetables, fruits, whole grains, low-fat dairy products, and lean protein.  Do not eat a lot of foods high in solid fats, added sugars, or salt.  Maintain a Healthy Weight Regular exercise can help you achieve or maintain a healthy weight. You should:  Do at least 150 minutes of exercise each week. The exercise should increase your heart rate and make you sweat (moderate-intensity exercise).  Do strength-training exercises at least twice a week.  Watch Your Levels of Cholesterol and Blood Lipids  Have your blood tested for lipids and cholesterol every 5 years starting at 69 years of age. If you are at high risk for heart disease, you should start having your blood tested when you are 69 years old. You may need to have your cholesterol levels checked more often if: ? Your lipid or cholesterol levels are high. ? You are older than 69 years of age. ? You  are at high risk for heart disease.  What should I know about cancer screening? Many types of cancers can be detected early and may often be prevented. Lung Cancer  You should be screened every year for lung cancer if: ? You are a current smoker who has smoked for at least 30 years. ? You are a former smoker who has quit within the past 15 years.  Talk to your health care provider about your screening options, when you should start screening, and how often you should be screened.  Colorectal Cancer  Routine colorectal cancer screening usually begins at 69 years of age and should be repeated every 5-10 years until you are 69 years old. You may need to be screened more often if early forms of precancerous polyps or small growths are found. Your health care provider may recommend screening at an earlier age if you have risk factors for colon cancer.  Your health care provider may recommend using home test kits to check for hidden blood in the stool.  A small camera at the end of a tube can be used to examine your colon (sigmoidoscopy or colonoscopy). This checks for the earliest forms of colorectal cancer.  Prostate and Testicular Cancer  Depending on your age and overall health, your health care provider may do certain tests to screen for prostate and testicular cancer.  Talk to your health care provider about any symptoms or concerns you have about testicular or prostate cancer.  Skin Cancer  Check your skin   from head to toe regularly.  Tell your health care provider about any new moles or changes in moles, especially if: ? There is a change in a mole's size, shape, or color. ? You have a mole that is larger than a pencil eraser.  Always use sunscreen. Apply sunscreen liberally and repeat throughout the day.  Protect yourself by wearing long sleeves, pants, a wide-brimmed hat, and sunglasses when outside.  What should I know about heart disease, diabetes, and high blood  pressure?  If you are 18-39 years of age, have your blood pressure checked every 3-5 years. If you are 40 years of age or older, have your blood pressure checked every year. You should have your blood pressure measured twice-once when you are at a hospital or clinic, and once when you are not at a hospital or clinic. Record the average of the two measurements. To check your blood pressure when you are not at a hospital or clinic, you can use: ? An automated blood pressure machine at a pharmacy. ? A home blood pressure monitor.  Talk to your health care provider about your target blood pressure.  If you are between 45-79 years old, ask your health care provider if you should take aspirin to prevent heart disease.  Have regular diabetes screenings by checking your fasting blood sugar level. ? If you are at a normal weight and have a low risk for diabetes, have this test once every three years after the age of 45. ? If you are overweight and have a high risk for diabetes, consider being tested at a younger age or more often.  A one-time screening for abdominal aortic aneurysm (AAA) by ultrasound is recommended for men aged 65-75 years who are current or former smokers. What should I know about preventing infection? Hepatitis B If you have a higher risk for hepatitis B, you should be screened for this virus. Talk with your health care provider to find out if you are at risk for hepatitis B infection. Hepatitis C Blood testing is recommended for:  Everyone born from 1945 through 1965.  Anyone with known risk factors for hepatitis C.  Sexually Transmitted Diseases (STDs)  You should be screened each year for STDs including gonorrhea and chlamydia if: ? You are sexually active and are younger than 69 years of age. ? You are older than 69 years of age and your health care provider tells you that you are at risk for this type of infection. ? Your sexual activity has changed since you were last  screened and you are at an increased risk for chlamydia or gonorrhea. Ask your health care provider if you are at risk.  Talk with your health care provider about whether you are at high risk of being infected with HIV. Your health care provider may recommend a prescription medicine to help prevent HIV infection.  What else can I do?  Schedule regular health, dental, and eye exams.  Stay current with your vaccines (immunizations).  Do not use any tobacco products, such as cigarettes, chewing tobacco, and e-cigarettes. If you need help quitting, ask your health care provider.  Limit alcohol intake to no more than 2 drinks per day. One drink equals 12 ounces of beer, 5 ounces of wine, or 1 ounces of hard liquor.  Do not use street drugs.  Do not share needles.  Ask your health care provider for help if you need support or information about quitting drugs.  Tell your health care   provider if you often feel depressed.  Tell your health care provider if you have ever been abused or do not feel safe at home. This information is not intended to replace advice given to you by your health care provider. Make sure you discuss any questions you have with your health care provider. Document Released: 03/07/2008 Document Revised: 05/08/2016 Document Reviewed: 06/13/2015 Elsevier Interactive Patient Education  2018 [employer]  

## 2017-08-22 NOTE — Progress Notes (Signed)
Gregory Mathews. 71 y.o.   Chief Complaint  Patient presents with  . Annual Exam  . Medication Refill    lorsartan,glipizide and test stips    HISTORY OF PRESENT ILLNESS: This is a 71 y.o. male Here for annual exam; no complaints and no medical concerns. However has strong FHx of ASCD and occasionally gets slight chest pain with exertion.   HPI   Prior to Admission medications   Medication Sig Start Date End Date Taking? Authorizing Provider  aspirin 81 MG tablet Take 81 mg by mouth every other day.    Yes [provider]  losartan (COZAAR) 25 MG tablet Take 1 tablet (25 mg total) by mouth daily. 05/07/17  Yes Wardell Honour, MD  Multiple Vitamin (MULTIVITAMIN) tablet Take 1 tablet by mouth daily.   Yes [provider]  azelastine (ASTELIN) 0.1 % nasal spray PLACE 2 SPRAYS INTO BOTH NOSTRILS 2 TIMES DAILY. USE IN [facility] NOSTRIL AS DIRECTED Patient not taking: Reported on 08/22/2017 03/21/17   Wardell Honour, MD  blood glucose meter kit and supplies Use to test blood sugar daily. Dx: E11.9 10/11/15   Tereasa Coop, PA-C  glipiZIDE (GLUCOTROL) 5 MG tablet Take 1 tablet (5 mg total) by mouth 2 (two) times daily before a meal. 05/07/17   Wardell Honour, MD  Lancets MISC Use to test blood sugar daily. Dx code :E11.9 10/11/15   Tereasa Coop, PA-C  ONE TOUCH ULTRA TEST test strip TEST BLOOD SUGAR DAILY 06/24/17   Tereasa Coop, PA-C  oxymetazoline (AFRIN NASAL SPRAY) 0.05 % nasal spray Place 1 spray into both nostrils 2 (two) times daily. Only as needed. Patient not taking: Reported on 08/22/2017 04/05/17   Gregory Pollen, MD  triamcinolone (NASACORT) 55 MCG/ACT AERO nasal inhaler Place 2 sprays into the nose daily. Patient not taking: Reported on 08/22/2017 04/05/17   Gregory Pollen, MD    Allergies  Allergen Reactions  . Metformin And Related Diarrhea  . Statins Other (See Comments)    Severe leg pain    Patient Active Problem List   Diagnosis Date Noted  . 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 BMI 31.0-31.9,adult 03/13/2016  . Essential hypertension 08/29/2015  . Diverticulosis of colon without hemorrhage 08/01/2015  . Statin intolerance 08/01/2015  . Basal cell carcinoma of nose 08/23/2014  . Rosacea 08/23/2014  . Seasonal allergies 01/26/2013  . Diabetes mellitus type 2, uncomplicated ([place]) 85/10/7739  . Hyperlipidemia 07/09/2012  . Erectile dysfunction 07/09/2012    Past Medical History:  Diagnosis Date  . Allergy   . Basal cell carcinoma 01/2013   Nasal and R forearm.  [facility]  . Cataract   . Diabetes mellitus without complication ([place])   . Hyperlipidemia   . Rosacea     Past Surgical History:  Procedure Laterality Date  . basal cell removal     nose and wrist   . BELPHAROPTOSIS REPAIR    . EYE SURGERY     Cataract surgery  . eyelid surgery    . SHOULDER OPEN ROTATOR CUFF REPAIR  1983    Social History   Socioeconomic History  . Marital status: Married    Spouse name: [name]  . Number of children: 4  . Years of education: 6  . Highest education level: Not on file  Social Needs  . Financial resource strain: Not on file  . Food insecurity - worry: Not on file  . Food insecurity - inability: Not on  file  . Transportation needs - medical: Not on file  . Transportation needs - non-medical: Not on file  Occupational History  . Occupation: bus Engineer, manufacturing systems: [employer]  Tobacco Use  . Smoking status: Former Smoker    Packs/day: 1.00    Years: 15.00    Pack years: 15.00    Last attempt to quit: 07/09/1982    Years since quitting: 35.1  . Smokeless tobacco: Never Used  Substance and Sexual Activity  . Alcohol use: No  . Drug use: No  . Sexual activity: Yes    Partners: Female  Other Topics Concern  . Not on file  Social History Narrative   Marital status: married x 43 years; happily married.       Children:  4 children; 4 grandchildren.  Adult children all live locally.      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      Tobacco:  In past; smoked x 15 years; quit 35 years ago.      Alcohol: none      Exercise:sporadic in 2017.      ADLs: independent with all ADLs.  Does not use assistant device with ambulation.      Living Will:  Has living will; desires FULL CODE; no prolonged measures.      Doctoral degree in Biblical Studies.        Family History  Problem Relation Age of Onset  . Cancer Mother        leukemia  . Stroke Mother   . Heart disease Father   . Stroke Father   . Colon cancer Father 25  . Cancer Father 80       Colon cancer  . Diabetes Sister   . Heart disease Brother   . Cancer Maternal Grandmother   . Stroke Maternal Grandfather   . Diabetes Brother   . Diabetes Brother   . Cancer Brother        colon cancer     Review of Systems  Constitutional: Negative for chills, fever and weight loss.  HENT: Negative for congestion, hearing loss, nosebleeds and sore throat.        States carotid ultrasound done 2-3 years ago showed right sided mild occlussion.  Eyes: Negative.  Negative for discharge and redness.  Respiratory: Negative.  Negative for cough, hemoptysis and shortness of breath.   Cardiovascular: Positive for chest pain (sometimes during exertion). Negative for palpitations, orthopnea and claudication.  Gastrointestinal: Negative for abdominal pain, blood in stool, constipation, diarrhea, nausea and vomiting.  Genitourinary: Negative.  Negative for dysuria, flank pain, frequency, hematuria and urgency.  Musculoskeletal: Negative.  Negative for back pain, joint pain, myalgias and neck pain.  Skin: Negative.  Negative for rash.  Neurological: Negative for dizziness, sensory change, focal weakness and headaches.  Endo/Heme/Allergies: Negative.   All other systems reviewed and are negative.  Vitals:     08/22/17 0946  BP: 110/62  Pulse: 69  Resp: 16  Temp: 98.3 F (36.8 C)  SpO2: 96%     Physical Exam  Constitutional: He is oriented to person, place, and time. He appears well-developed and well-nourished.  HENT:  Head: Normocephalic and atraumatic.  Right Ear: External ear normal.  Left Ear: External ear normal.  Nose: Nose normal.  Mouth/Throat: Oropharynx is clear and moist.  Eyes: Conjunctivae and EOM are normal. Pupils are equal,  round, and reactive to light.  Neck: Normal range of motion. Neck supple. No JVD present. No thyromegaly present.  Cardiovascular: Normal rate, regular rhythm, normal heart sounds and intact distal pulses.  Pulmonary/Chest: Effort normal and breath sounds normal.  Abdominal: Soft. Bowel sounds are normal. He exhibits no distension. There is no tenderness.  Genitourinary: Prostate normal. Rectal exam shows no external hemorrhoid, no internal hemorrhoid, no fissure, no mass, no tenderness, anal tone normal and guaiac negative stool.  Musculoskeletal: Normal range of motion. He exhibits no edema or tenderness.  Lymphadenopathy:    He has no cervical adenopathy.  Neurological: He is alert and oriented to person, place, and time. No sensory deficit. He exhibits normal muscle tone.  Skin: Skin is warm and dry. Capillary refill takes less than 2 seconds. No rash noted.  Psychiatric: He has a normal mood and affect. His behavior is normal.  Vitals reviewed.    ASSESSMENT & PLAN: Arhan was seen today for annual exam and medication refill.  Diagnoses and all orders for this visit:  Routine general medical examination at a health care facility -     Comprehensive metabolic panel -     Hemoglobin A1c -     Lipid panel -     PSA(Must document that pt has been informed of limitations of PSA testing.) -     CBC with Differential -     POC Hemoccult Bld/Stl (1-Cd Office Dx)  Chest pain, unspecified type -     Ambulatory referral to Cardiology -      EKG 12-Lead  History of carotid stenosis -     US Carotid Duplex Bilateral; Future    Patient Instructions       IF you received an x-ray today, you will receive an invoice from [facility]. Please contact [facility] at [phone] with questions or concerns regarding your invoice.   IF you received labwork today, you will receive an invoice from [facility]. Please contact [facility] at [phone] with questions or concerns regarding your invoice.   Our billing staff will not be able to assist you with questions regarding bills from these companies.  You will be contacted with the lab results as soon as they are available. The fastest way to get your results is to activate your My Chart account. Instructions are located on the last page of this paperwork. If you have not heard from [place] regarding the results in 2 weeks, please contact this office.      Health Maintenance, Male A healthy lifestyle and preventive care is important for your health and wellness. Ask your health care provider about what schedule of regular examinations is right for you. What should I know about weight and diet? Eat a Healthy Diet  Eat plenty of vegetables, fruits, whole grains, low-fat dairy products, and lean protein.  Do not eat a lot of foods high in solid fats, added sugars, or salt.  Maintain a Healthy Weight Regular exercise can help you achieve or maintain a healthy weight. You should:  Do at least 150 minutes of exercise each week. The exercise should increase your heart rate and make you sweat (moderate-intensity exercise).  Do strength-training exercises at least twice a week.  Watch Your Levels of Cholesterol and Blood Lipids  Have your blood tested for lipids and cholesterol every 5 years starting at 69 years of age. If you are at high risk for heart disease, you should start having your blood tested when you are 69 years  old. Dennis Bast may need to have your cholesterol  levels checked more often if: ? Your lipid or cholesterol levels are high. ? You are older than 69 years of age. ? You are at high risk for heart disease.  What should I know about cancer screening? Many types of cancers can be detected early and may often be prevented. Lung Cancer  You should be screened every year for lung cancer if: ? You are a current smoker who has smoked for at least 30 years. ? You are a former smoker who has quit within the past 15 years.  Talk to your health care provider about your screening options, when you should start screening, and how often you should be screened.  Colorectal Cancer  Routine colorectal cancer screening usually begins at 69 years of age and should be repeated every 5-10 years until you are 69 years old. You may need to be screened more often if early forms of precancerous polyps or small growths are found. Your health care provider may recommend screening at an earlier age if you have risk factors for colon cancer.  Your health care provider may recommend using home test kits to check for hidden blood in the stool.  A small camera at the end of a tube can be used to examine your colon (sigmoidoscopy or colonoscopy). This checks for the earliest forms of colorectal cancer.  Prostate and Testicular Cancer  Depending on your age and overall health, your health care provider may do certain tests to screen for prostate and testicular cancer.  Talk to your health care provider about any symptoms or concerns you have about testicular or prostate cancer.  Skin Cancer  Check your skin from head to toe regularly.  Tell your health care provider about any new moles or changes in moles, especially if: ? There is a change in a mole's size, shape, or color. ? You have a mole that is larger than a pencil eraser.  Always use sunscreen. Apply sunscreen liberally and repeat throughout the day.  Protect yourself by wearing long sleeves, pants, a  wide-brimmed hat, and sunglasses when outside.  What should I know about heart disease, diabetes, and high blood pressure?  If you are 87-70 years of age, have your blood pressure checked every 3-5 years. If you are 7 years of age or older, have your blood pressure checked every year. You should have your blood pressure measured twice-once when you are at a hospital or clinic, and once when you are not at a hospital or clinic. Record the average of the two measurements. To check your blood pressure when you are not at a hospital or clinic, you can use: ? An automated blood pressure machine at a pharmacy. ? A home blood pressure monitor.  Talk to your health care provider about your target blood pressure.  If you are between 64-5 years old, ask your health care provider if you should take aspirin to prevent heart disease.  Have regular diabetes screenings by checking your fasting blood sugar level. ? If you are at a normal weight and have a low risk for diabetes, have this test once every three years after the age of 1. ? If you are overweight and have a high risk for diabetes, consider being tested at a younger age or more often.  A one-time screening for abdominal aortic aneurysm (AAA) by ultrasound is recommended for men aged 81-75 years who are current or former smokers. What should I know  about preventing infection? Hepatitis B If you have a higher risk for hepatitis B, you should be screened for this virus. Talk with your health care provider to find out if you are at risk for hepatitis B infection. Hepatitis C Blood testing is recommended for:  Everyone born from [birth year] through 1963.  Anyone with known risk factors for hepatitis C.  Sexually Transmitted Diseases (STDs)  You should be screened each year for STDs including gonorrhea and chlamydia if: ? You are sexually active and are younger than 69 years of age. ? You are older than 69 years of age and your health care provider  tells you that you are at risk for this type of infection. ? Your sexual activity has changed since you were last screened and you are at an increased risk for chlamydia or gonorrhea. Ask your health care provider if you are at risk.  Talk with your health care provider about whether you are at high risk of being infected with HIV. Your health care provider may recommend a prescription medicine to help prevent HIV infection.  What else can I do?  Schedule regular health, dental, and eye exams.  Stay current with your vaccines (immunizations).  Do not use any tobacco products, such as cigarettes, chewing tobacco, and e-cigarettes. If you need help quitting, ask your health care provider.  Limit alcohol intake to no more than 2 drinks per day. One drink equals 12 ounces of beer, 5 ounces of wine, or 1 ounces of hard liquor.  Do not use street drugs.  Do not share needles.  Ask your health care provider for help if you need support or information about quitting drugs.  Tell your health care provider if you often feel depressed.  Tell your health care provider if you have ever been abused or do not feel safe at home. This information is not intended to replace advice given to you by your health care provider. Make sure you discuss any questions you have with your health care provider. Document Released: 03/07/2008 Document Revised: 05/08/2016 Document Reviewed: 06/13/2015 Elsevier Interactive Patient Education  2018 Elsevier Inc.      Agustina Caroli, MD [facility]

## 2017-08-23 HISTORY — PX: TRANSTHORACIC ECHOCARDIOGRAM: SHX275

## 2017-08-23 LAB — COMPREHENSIVE METABOLIC PANEL
A/G RATIO: 1.5 (ref 1.2–2.2)
ALT: 27 IU/L (ref 0–44)
AST: 28 IU/L (ref 0–40)
Albumin: 4.6 g/dL (ref 3.6–4.8)
Alkaline Phosphatase: 58 IU/L (ref 39–117)
BILIRUBIN TOTAL: 0.6 mg/dL (ref 0.0–1.2)
BUN/Creatinine Ratio: 17 (ref 10–24)
BUN: 15 mg/dL (ref 8–27)
CALCIUM: 10.3 mg/dL — AB (ref 8.6–10.2)
CHLORIDE: 103 mmol/L (ref 96–106)
CO2: 23 mmol/L (ref 20–29)
Creatinine, Ser: 0.86 mg/dL (ref 0.76–1.27)
GFR, EST AFRICAN AMERICAN: 102 mL/min/{1.73_m2} (ref >59)
GFR, EST NON AFRICAN AMERICAN: 88 mL/min/{1.73_m2} (ref >59)
GLOBULIN, TOTAL: 3 g/dL (ref 1.5–4.5)
Glucose: 126 mg/dL — ABNORMAL HIGH (ref 65–99)
POTASSIUM: 4.5 mmol/L (ref 3.5–5.2)
SODIUM: 141 mmol/L (ref 134–144)
TOTAL PROTEIN: 7.6 g/dL (ref 6.0–8.5)

## 2017-08-23 LAB — CBC WITH DIFFERENTIAL/PLATELET
BASOS ABS: 0.1 10*3/uL (ref 0.0–0.2)
Basos: 1 %
EOS (ABSOLUTE): 0.3 10*3/uL (ref 0.0–0.4)
Eos: 4 %
HEMATOCRIT: 41.3 % (ref 37.5–51.0)
HEMOGLOBIN: 13.5 g/dL (ref 13.0–17.7)
IMMATURE GRANS (ABS): 0 10*3/uL (ref 0.0–0.1)
Immature Granulocytes: 0 %
LYMPHS: 29 %
Lymphocytes Absolute: 2.3 10*3/uL (ref 0.7–3.1)
MCH: 30.1 pg (ref 26.6–33.0)
MCHC: 32.7 g/dL (ref 31.5–35.7)
MCV: 92 fL (ref 79–97)
Monocytes Absolute: 0.5 10*3/uL (ref 0.1–0.9)
Monocytes: 6 %
NEUTROS ABS: 4.7 10*3/uL (ref 1.4–7.0)
Neutrophils: 60 %
Platelets: 233 10*3/uL (ref 150–379)
RBC: 4.49 x10E6/uL (ref 4.14–5.80)
RDW: 13.7 % (ref 12.3–15.4)
WBC: 7.8 10*3/uL (ref 3.4–10.8)

## 2017-08-23 LAB — LIPID PANEL
Chol/HDL Ratio: 4.4 ratio (ref 0.0–5.0)
Cholesterol, Total: 168 mg/dL (ref 100–199)
HDL: 38 mg/dL — ABNORMAL LOW (ref >39)
LDL CALC: 106 mg/dL — AB (ref 0–99)
Triglycerides: 118 mg/dL (ref 0–149)
VLDL CHOLESTEROL CAL: 24 mg/dL (ref 5–40)

## 2017-08-23 LAB — PSA: PROSTATE SPECIFIC AG, SERUM: 0.8 ng/mL (ref 0.0–4.0)

## 2017-08-23 LAB — HEMOGLOBIN A1C
Est. average glucose Bld gHb Est-mCnc: 157 mg/dL
Hgb A1c MFr Bld: 7.1 % — ABNORMAL HIGH (ref 4.8–5.6)

## 2017-08-25 ENCOUNTER — Other Ambulatory Visit: Payer: Self-pay | Admitting: Emergency Medicine

## 2017-08-25 MED ORDER — LINAGLIPTIN 5 MG PO TABS: 5.0000 mg | ORAL_TABLET | Freq: Every day | ORAL | 1 refills | Status: DC

## 2017-08-26 ENCOUNTER — Encounter: Payer: Medicare Other | Admitting: Family Medicine

## 2017-08-28 ENCOUNTER — Telehealth: Payer: Self-pay | Admitting: Family Medicine

## 2017-08-28 NOTE — Telephone Encounter (Signed)
Pt called requesting a change in his medications;Pt was seen by Dr Franky Macho 08/22/17; he states that his chart shows that he is allergic to metformin (causes diarrhea); pt states that he has a stomach ache due to metformin; pt states that he was to start tradjenta but when he went to pick it up, he found out it was very expensive; pt would like to have a 30 day trial of metformin and if this does not work he will go back to [place]; will route to [place] pool; pt can best be reached at [phone].

## 2017-09-02 LAB — HM DIABETES EYE EXAM

## 2017-09-03 ENCOUNTER — Encounter: Payer: Medicare Other | Admitting: Family Medicine

## 2017-09-03 ENCOUNTER — Telehealth: Payer: Self-pay | Admitting: Family Medicine

## 2017-09-03 NOTE — Telephone Encounter (Signed)
Please advise 

## 2017-09-03 NOTE — Telephone Encounter (Signed)
Copied from [place] [id number]. Topic: Quick Communication - Rx Refill/Question >> Sep 03, 2017  2:55 PM Scherrie Gerlach wrote: Pt called 12/06 and has not heard back about a medication change for him to try Metformin.  Pt states he is not allergic to it, just makes his stomach hurt.  Pharmacy gave him some suggestions on how to handle the stomach pain. Pt states this med does NOT give him diarrhea.   There is a phone note from that day. [id number] [address], [address]. [phone] (Phone) [phone] (Fax)

## 2017-09-03 NOTE — Telephone Encounter (Signed)
See duplicate encounter from 08/28/17.

## 2017-09-05 MED ORDER — METFORMIN HCL 500 MG PO TABS: 500.0000 mg | ORAL_TABLET | Freq: Two times a day (BID) | ORAL | 1 refills | Status: DC

## 2017-09-05 NOTE — Telephone Encounter (Signed)
Metformin has been sent to [facility] [address].

## 2017-09-05 NOTE — Addendum Note (Signed)
Addended by: Wardell Honour on: 09/05/2017 06:25 PM   Modules accepted: Orders

## 2017-09-07 NOTE — Telephone Encounter (Signed)
Metformin sent to pharmacy on 09/05/17 and patient notified via [place].

## 2017-09-10 ENCOUNTER — Ambulatory Visit (INDEPENDENT_AMBULATORY_CARE_PROVIDER_SITE_OTHER): Payer: Medicare Other | Admitting: Urgent Care

## 2017-09-10 DIAGNOSIS — Z23 Encounter for immunization: Secondary | ICD-10-CM | POA: Diagnosis not present

## 2017-09-11 ENCOUNTER — Encounter: Payer: Self-pay | Admitting: Cardiology

## 2017-09-11 ENCOUNTER — Ambulatory Visit (INDEPENDENT_AMBULATORY_CARE_PROVIDER_SITE_OTHER): Payer: Medicare Other | Admitting: Cardiology

## 2017-09-11 VITALS — BP 136/72 | HR 70 | Ht 68.25 in | Wt 214.8 lb

## 2017-09-11 DIAGNOSIS — I10 Essential (primary) hypertension: Secondary | ICD-10-CM | POA: Diagnosis not present

## 2017-09-11 DIAGNOSIS — R011 Cardiac murmur, unspecified: Secondary | ICD-10-CM

## 2017-09-11 DIAGNOSIS — R0789 Other chest pain: Secondary | ICD-10-CM | POA: Diagnosis not present

## 2017-09-11 DIAGNOSIS — E785 Hyperlipidemia, unspecified: Secondary | ICD-10-CM | POA: Diagnosis not present

## 2017-09-11 DIAGNOSIS — Z8249 Family history of ischemic heart disease and other diseases of the circulatory system: Secondary | ICD-10-CM

## 2017-09-11 DIAGNOSIS — I350 Nonrheumatic aortic (valve) stenosis: Secondary | ICD-10-CM | POA: Insufficient documentation

## 2017-09-11 DIAGNOSIS — E8881 Metabolic syndrome: Secondary | ICD-10-CM | POA: Diagnosis not present

## 2017-09-11 DIAGNOSIS — E119 Type 2 diabetes mellitus without complications: Secondary | ICD-10-CM | POA: Diagnosis not present

## 2017-09-11 NOTE — Progress Notes (Signed)
PCP: Wardell Honour, MD  Clinic Note: Chief Complaint  Patient presents with  . New Patient (Initial Visit)    Family history of CAD, atypical chest pain    HPI: Gregory Mathews. is a 71 y.o. male who is being seen today for the evaluation of atypical chest pain with strong family history of CAD at the request of Horald Pollen, *.  Gregory Mathews. was last seen on November 30 by his PCP.  He noted some intermittent episodes of exertional chest pain and has a significant family history of CAD.  Because his family history, he is referred to cardiology evaluation.  Recent Hospitalizations: None  Studies Personally Reviewed - (if available, images/films reviewed: From Epic Chart or Care Everywhere)  Exercise Tolerance Test December 2016: Exercised for 9:01 min --> blunted blood pressure response no EKG changes.  No chest pain.  Low Risk  Interval History: Gregory Mathews is a very pleasant gentleman who has a pretty strong family history with father having had bypass surgery and stroke as well as both a sister and brother in their 41s and 73s having coronary disease.  He himself has never been diagnosed with coronary artery, but does have high blood pressure and diabetes mellitus along with hyperlipidemia.  He tells me that he is very active exercise about 45 minutes to an hour almost every day.  He tells me that over the last couple months or so he has not been doing as much because he has been try to deal with the fact that he has had a basal cell carcinoma removed.  He has multiple different stair cases in his house and goes up and down without any at a time without any chest tightness or pressure.  For the most part, he has been relatively astigmatic.  He tells me that shortly before he saw his PCP he had had some episodes of some aching type discomfort in his chest usually would occur after exertion.  He felt like maybe he had pulled a muscle.  He was doing some lifting and carrying  objects up a few flights of steps.  Shortly after doing that he felt a "twinge in his chest ".  This did not occur while he was guarding, but after exertion.  Otherwise, he denies any exertional chest pain, dyspnea or fatigue with exertion.  He denies any PND, orthopnea or edema.  No palpitations, lightheadedness, dizziness, weakness or syncope/near syncope. No TIA/amaurosis fugax symptoms. No melena, hematochezia, hematuria, or epstaxis. No claudication.  ROS: A comprehensive was performed. Review of Systems  Constitutional: Negative for malaise/fatigue.  HENT: Negative for congestion.   Respiratory: Negative for cough, shortness of breath and wheezing.   Musculoskeletal: Negative for falls.  Neurological: Negative for dizziness.  Psychiatric/Behavioral: Negative for depression and memory loss. The patient is not nervous/anxious and does not have insomnia.   All other systems reviewed and are negative.  I have reviewed and (if needed) personally updated the patient's problem list, medications, allergies, past medical and surgical history, social and family history.   Past Medical History:  Diagnosis Date  . Allergy   . Basal cell carcinoma 01/2013   Nasal and R forearm.  [facility]  . Cataract   . Diabetes mellitus without complication (HCC)    on PO Meds  . Essential hypertension   . Hyperlipidemia    statin intolerant -- myalgias, fatigue (tried at least 4)  . Rosacea     Past Surgical History:  Procedure Laterality Date  . basal cell removal     nose and wrist   . BELPHAROPTOSIS REPAIR    . EYE SURGERY     Cataract surgery  . eyelid surgery    . SHOULDER OPEN ROTATOR CUFF REPAIR  1983    Current Meds  Medication Sig  . aspirin 81 MG tablet Take 81 mg by mouth every other day.   . blood glucose meter kit and supplies Use to test blood sugar daily. Dx: E11.9  . glipiZIDE (GLUCOTROL) 5 MG tablet Take 1 tablet (5 mg total) by mouth 2 (two) times daily  before a meal.  . Lancets MISC Use to test blood sugar daily. Dx code :E11.9  . losartan (COZAAR) 25 MG tablet Take 1 tablet (25 mg total) by mouth daily.  . metFORMIN (GLUCOPHAGE) 500 MG tablet Take 1 tablet (500 mg total) by mouth 2 (two) times daily with a meal.  . Multiple Vitamin (MULTIVITAMIN) tablet Take 1 tablet by mouth daily.  . ONE TOUCH ULTRA TEST test strip TEST BLOOD SUGAR DAILY    Allergies  Allergen Reactions  . Lisinopril Cough  . Statins Other (See Comments)    Severe leg pain & fatigue -- tried at least 4 (even low dose)    Social History   Tobacco Use  . Smoking status: Former Smoker    Packs/day: 1.00    Years: 15.00    Pack years: 15.00    Last attempt to quit: 07/09/1982    Years since quitting: 35.2  . Smokeless tobacco: Never Used  Substance Use Topics  . Alcohol use: No  . Drug use: No   Social History   Social History Narrative   Marital status: married x 56 years; happily married.       Children:  4 children; 4 grandchildren. Adult children all live locally.      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      Tobacco:  In past; smoked x 15 years; quit 35 years ago.      Alcohol: none      Exercise:sporadic in 2017.      ADLs: independent with all ADLs.  Does not use assistant device with ambulation.      Living Will:  Has living will; desires FULL CODE; no prolonged measures.      Doctoral degree in Biblical Studies.       Social History family history includes Cancer in his brother and maternal grandmother; Cancer (age of onset: 71) in his father; Colon cancer (age of onset: 45) in his father; Congestive Heart Failure in his brother; Coronary artery disease (age of onset: 92) in his father; Diabetes in his brother and sister; Diabetes (age of onset: 77) in his brother; Heart attack (age of onset: 79) in his brother; Leukemia in his mother; Peripheral  Artery Disease in his father; Rheumatic fever in his brother; Stroke in his maternal grandfather; Stroke (age of onset: 18) in his father; Sudden Cardiac Death (age of onset: 64) in his sister.  Wt Readings from Last 3 Encounters:  09/11/17 214 lb 12.8 oz (97.4 kg)  08/22/17 215 lb 3.2 oz (97.6 kg)  05/07/17 214 lb (97.1 kg)    PHYSICAL EXAM BP 136/72 (BP Location: Left Arm)   Pulse 70   Ht 5' 8.25" (1.734 m)   Wt 214 lb 12.8 oz (97.4 kg)   BMI  32.42 kg/m  Physical Exam  Constitutional: He appears well-developed and well-nourished. No distress.  Well-groomed.  Healthy-appearing.  Mildly obese  HENT:  Head: Normocephalic and atraumatic.  Mouth/Throat: No oropharyngeal exudate.  Eyes: Conjunctivae and EOM are normal. No scleral icterus.  History of cataract surgery.  Pupils are reactive, but not round or equal  Neck: Normal range of motion. Neck supple. No hepatojugular reflux and no JVD present. Carotid bruit is not present.  Cardiovascular: Normal rate, regular rhythm and normal pulses.  No extrasystoles are present. PMI is not displaced. Exam reveals no gallop and no friction rub.  Murmur heard. Soft systolic murmur heard in both the aortic and mitral position.  Appears to be systolic ejection  Skin: He is not diaphoretic.  Nursing note and vitals reviewed.    Adult ECG Report From PCPs office on August 22, 2017 rate: 61;  Rhythm: normal sinus rhythm and Normal axis, intervals and durations;   Narrative Interpretation:  normal EKG   Other studies Reviewed: Additional studies/ records that were reviewed today include:  Recent Labs:    Lab Results  Component Value Date   HGBA1C 7.1 (H) 08/22/2017   Lab Results  Component Value Date   CHOL 168 08/22/2017   HDL 38 (L) 08/22/2017   LDLCALC 106 (H) 08/22/2017   TRIG 118 08/22/2017   CHOLHDL 4.4 08/22/2017   Lab Results  Component Value Date   CREATININE 0.86 08/22/2017   BUN 15 08/22/2017   NA 141 08/22/2017   K  4.5 08/22/2017   CL 103 08/22/2017   CO2 23 08/22/2017    ASSESSMENT / PLAN: Problem List Items Addressed This Visit    Atypical chest pain    Again, his symptoms sound relatively atypical from a cardiac standpoint.  Did not occur with exertion, occur occurred after exertion.  Not made worse with exertion. Previously evaluated with a GXT that was negative.  Based on his activity level, I would expect him to have a negative for risk stratification at this point, the next step would be to perform a coronary calcium scoring to assess his baseline cardiac risk.  Also with a systolic murmur we will check a 2D echocardiogram to assess his overall cardiac function and potentially valve disease.      Relevant Orders   CT CARDIAC SCORING   ECHOCARDIOGRAM COMPLETE   Diabetes mellitus type 2, uncomplicated (HCC) (Chronic)   Relevant Orders   CT CARDIAC SCORING   Essential hypertension (Chronic)    Well-controlled blood pressure with low-dose  losartan.      Family history of early CAD - Primary (Chronic)    Pretty significant history with his father brother and sister having CAD.  His risk factors other than family history including former smoker, diabetes, hypertension and hyperlipidemia. He really is not having any true anginal what he describes is probably passed on anginal musculoskeletal type discomfort.  However cannot exclude ischemia. Plan: We will check coronary calcium score for baseline surveillance.  Depending on this result, this may lead to further testing versus more aggressive lipid management.      Relevant Orders   CT CARDIAC SCORING   Hyperlipidemia with target LDL less than 100 (Chronic)    Seems to be statin intolerant.  Not currently on any medication.  LDL is 106.  Depending on his findings on coronary calcium score, we may need to be more aggressive and consider actually medical treatment.  Despite that, LDL goal should probably be less than 100  with his risk  factors. Plan: Risk stratification with a coronary calcium score      Metabolic syndrome (Chronic)    He has diabetes, hypertension and obesity.  This combination and of itself would comprise metabolic syndrome.  This puts him at higher risk.  Therefore it is reasonable to risk stratify --he had a GXT 2 years ago that was negative.  In order to get a baseline assessment of existing disease if present, even if not flow-limiting, coronary calcium scoring is probably the best option which could potentially do a coronary CTA.      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 Plan: 2D echocardiogram to assess.      Relevant Orders   ECHOCARDIOGRAM COMPLETE      Current medicines are reviewed at length with the patient today. (+/- concerns) none The following changes have been made:None  Patient Instructions  NO CHANGES WITH MEDICATIONS     Your physician recommends that you schedule a follow-up appointment in [place].   TESTING SCHEDULE AT [place] Your physician has requested that you have an echocardiogram. Echocardiography is a painless test that uses sound waves to create images of your heart. It provides your doctor with information about the size and shape of your heart and how well your heart's chambers and valves are working. This procedure takes approximately one hour. There are no restrictions for this procedure.  AND  has ordered a CT coronary calcium score. This test is done at [address]. THIS TEST IS NOT COVERED BY INSURANCE , THERE IS A$150 CHARGE DUE TIME OF TESTING.   Coronary CalciumScan A coronary calcium scan is an imaging test used to look for deposits of calcium and other fatty materials (plaques) in the inner lining of the blood vessels of the heart (coronary  arteries). These deposits of calcium and plaques can partly clog and narrow the coronary arteries without producing any symptoms or warning signs. This puts a person at risk for a heart attack. This test can detect these deposits before symptoms develop. Tell a health care provider about:  Any allergies you have.  All medicines you are taking, including vitamins, herbs, eye drops, creams, and over-the-counter medicines.  Any problems you or family members have had with anesthetic medicines.  Any blood disorders you have.  Any surgeries you have had.  Any medical conditions you have.  Whether you are pregnant or may be pregnant. What are the risks? Generally, this is a safe procedure. However, problems may occur, including:  Harm to a pregnant woman and her unborn baby. This test involves the use of radiation. Radiation exposure can be dangerous to a pregnant woman and her unborn baby. If you are pregnant, you generally should not have this procedure done.  Slight increase in the risk of cancer. This is because of the radiation involved in the test. What happens before the procedure? No preparation is needed for this procedure. What happens during the procedure?  You will undress and remove any jewelry around your neck or chest.  You will put on a hospital gown.  Sticky electrodes will be placed on your chest. The electrodes will be connected to an electrocardiogram (ECG) machine to record a tracing of the electrical activity of your heart.  A CT  scanner will take pictures of your heart. During this time, you will be asked to lie still and hold your breath for 2-3 seconds while a picture of your heart is being taken. The procedure may vary among health care providers and hospitals. What happens after the procedure?  You can get dressed.  You can return to your normal activities.  It is up to you to get the results of your test. Ask your health care provider, or the department  that is doing the test, when your results will be ready. Summary  A coronary calcium scan is an imaging test used to look for deposits of calcium and other fatty materials (plaques) in the inner lining of the blood vessels of the heart (coronary arteries).  Generally, this is a safe procedure. Tell your health care provider if you are pregnant or may be pregnant.  No preparation is needed for this procedure.  A CT scanner will take pictures of your heart.  You can return to your normal activities after the scan is done. This information is not intended to replace advice given to you by your health care provider. Make sure you discuss any questions you have with your health care provider. Document Released: 03/07/2008 Document Revised: 07/29/2016 Document Reviewed: 07/29/2016 Elsevier Interactive Patient Education  2017 [employer].     Studies Ordered:   Orders Placed This Encounter  Procedures  . CT CARDIAC SCORING  . ECHOCARDIOGRAM COMPLETE      Glenetta Hew, M.D., M.S. Interventional Cardiologist   Pager # [phone] Phone # [phone] [address] [place], [place] [address]   Thank you for choosing [facility] at [facility]!!

## 2017-09-11 NOTE — Patient Instructions (Signed)
NO CHANGES WITH MEDICATIONS     Your physician recommends that you schedule a follow-up appointment in [place].   TESTING SCHEDULE AT [place] Your physician has requested that you have an echocardiogram. Echocardiography is a painless test that uses sound waves to create images of your heart. It provides your doctor with information about the size and shape of your heart and how well your heart's chambers and valves are working. This procedure takes approximately one hour. There are no restrictions for this procedure.  AND  has ordered a CT coronary calcium score. This test is done at [address]. THIS TEST IS NOT COVERED BY INSURANCE , THERE IS A$150 CHARGE DUE TIME OF TESTING.   Coronary CalciumScan A coronary calcium scan is an imaging test used to look for deposits of calcium and other fatty materials (plaques) in the inner lining of the blood vessels of the heart (coronary arteries). These deposits of calcium and plaques can partly clog and narrow the coronary arteries without producing any symptoms or warning signs. This puts a person at risk for a heart attack. This test can detect these deposits before symptoms develop. Tell a health care provider about:  Any allergies you have.  All medicines you are taking, including vitamins, herbs, eye drops, creams, and over-the-counter medicines.  Any problems you or family members have had with anesthetic medicines.  Any blood disorders you have.  Any surgeries you have had.  Any medical conditions you have.  Whether you are pregnant or may be pregnant. What are the risks? Generally, this is a safe procedure. However, problems may occur, including:  Harm to a pregnant woman and her unborn baby. This test involves the use of radiation. Radiation exposure can be dangerous to a pregnant woman and her unborn baby. If you are pregnant, you generally should not have this  procedure done.  Slight increase in the risk of cancer. This is because of the radiation involved in the test. What happens before the procedure? No preparation is needed for this procedure. What happens during the procedure?  You will undress and remove any jewelry around your neck or chest.  You will put on a hospital gown.  Sticky electrodes will be placed on your chest. The electrodes will be connected to an electrocardiogram (ECG) machine to record a tracing of the electrical activity of your heart.  A CT scanner will take pictures of your heart. During this time, you will be asked to lie still and hold your breath for 2-3 seconds while a picture of your heart is being taken. The procedure may vary among health care providers and hospitals. What happens after the procedure?  You can get dressed.  You can return to your normal activities.  It is up to you to get the results of your test. Ask your health care provider, or the department that is doing the test, when your results will be ready. Summary  A coronary calcium scan is an imaging test used to look for deposits of calcium and other fatty materials (plaques) in the inner lining of the blood vessels of the heart (coronary arteries).  Generally, this is a safe procedure. Tell your health care provider if you are pregnant or may be pregnant.  No preparation is needed for this procedure.  A CT scanner will take pictures of your heart.  You can return to your normal activities after the scan is done.  This information is not intended to replace advice given to you by your health care provider. Make sure you discuss any questions you have with your health care provider. Document Released: 03/07/2008 Document Revised: 07/29/2016 Document Reviewed: 07/29/2016 Elsevier Interactive Patient Education  2017 [employer].

## 2017-09-13 ENCOUNTER — Encounter: Payer: Self-pay | Admitting: Cardiology

## 2017-09-13 NOTE — Assessment & Plan Note (Signed)
Well-controlled blood pressure with low-dose  losartan.

## 2017-09-13 NOTE — Assessment & Plan Note (Signed)
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 Plan: 2D echocardiogram to assess.

## 2017-09-13 NOTE — Assessment & Plan Note (Signed)
Again, his symptoms sound relatively atypical from a cardiac standpoint.  Did not occur with exertion, occur occurred after exertion.  Not made worse with exertion. Previously evaluated with a GXT that was negative.  Based on his activity level, I would expect him to have a negative for risk stratification at this point, the next step would be to perform a coronary calcium scoring to assess his baseline cardiac risk.  Also with a systolic murmur we will check a 2D echocardiogram to assess his overall cardiac function and potentially valve disease.

## 2017-09-13 NOTE — Addendum Note (Signed)
Addended by: Leonie Man on: 09/13/2017 01:05 AM   Modules accepted: Level of Service

## 2017-09-13 NOTE — Assessment & Plan Note (Addendum)
Seems to be statin intolerant.  Not currently on any medication.  LDL is 106.  Depending on his findings on coronary calcium score, we may need to be more aggressive and consider actually medical treatment.  Despite that, LDL goal should probably be less than 100 with his risk factors. Plan: Risk stratification with a coronary calcium score

## 2017-09-13 NOTE — Assessment & Plan Note (Signed)
He has diabetes, hypertension and obesity.  This combination and of itself would comprise metabolic syndrome.  This puts him at higher risk.  Therefore it is reasonable to risk stratify --he had a GXT 2 years ago that was negative.  In order to get a baseline assessment of existing disease if present, even if not flow-limiting, coronary calcium scoring is probably the best option which could potentially do a coronary CTA.

## 2017-09-13 NOTE — Assessment & Plan Note (Signed)
Pretty significant history with his father brother and sister having CAD.  His risk factors other than family history including former smoker, diabetes, hypertension and hyperlipidemia. He really is not having any true anginal what he describes is probably passed on anginal musculoskeletal type discomfort.  However cannot exclude ischemia. Plan: We will check coronary calcium score for baseline surveillance.  Depending on this result, this may lead to further testing versus more aggressive lipid management.

## 2017-09-18 ENCOUNTER — Ambulatory Visit (HOSPITAL_COMMUNITY): Payer: Medicare Other | Attending: Cardiovascular Disease

## 2017-09-18 ENCOUNTER — Other Ambulatory Visit: Payer: Self-pay

## 2017-09-18 ENCOUNTER — Ambulatory Visit (INDEPENDENT_AMBULATORY_CARE_PROVIDER_SITE_OTHER)
Admission: RE | Admit: 2017-09-18 | Discharge: 2017-09-18 | Disposition: A | Discharge status: Home / Self Care | Payer: Self-pay | Source: Ambulatory Visit | Attending: Cardiology | Admitting: Cardiology

## 2017-09-18 DIAGNOSIS — R011 Cardiac murmur, unspecified: Secondary | ICD-10-CM

## 2017-09-18 DIAGNOSIS — I420 Dilated cardiomyopathy: Secondary | ICD-10-CM | POA: Diagnosis not present

## 2017-09-18 DIAGNOSIS — Z8249 Family history of ischemic heart disease and other diseases of the circulatory system: Secondary | ICD-10-CM

## 2017-09-18 DIAGNOSIS — R0789 Other chest pain: Secondary | ICD-10-CM

## 2017-09-18 DIAGNOSIS — I060 Rheumatic aortic stenosis: Secondary | ICD-10-CM | POA: Diagnosis not present

## 2017-09-18 DIAGNOSIS — E119 Type 2 diabetes mellitus without complications: Secondary | ICD-10-CM

## 2017-09-18 DIAGNOSIS — I5030 Unspecified diastolic (congestive) heart failure: Secondary | ICD-10-CM | POA: Insufficient documentation

## 2017-09-18 DIAGNOSIS — E8881 Metabolic syndrome: Secondary | ICD-10-CM

## 2017-09-19 ENCOUNTER — Telehealth: Payer: Self-pay | Admitting: Cardiology

## 2017-09-19 DIAGNOSIS — Z01818 Encounter for other preprocedural examination: Secondary | ICD-10-CM

## 2017-09-19 DIAGNOSIS — E119 Type 2 diabetes mellitus without complications: Secondary | ICD-10-CM

## 2017-09-19 DIAGNOSIS — R0789 Other chest pain: Secondary | ICD-10-CM

## 2017-09-19 DIAGNOSIS — R931 Abnormal findings on diagnostic imaging of heart and coronary circulation: Secondary | ICD-10-CM

## 2017-09-19 DIAGNOSIS — Z8249 Family history of ischemic heart disease and other diseases of the circulatory system: Secondary | ICD-10-CM

## 2017-09-19 NOTE — Telephone Encounter (Signed)
F/u Message ° °Pt returning RN call  °

## 2017-09-22 DIAGNOSIS — Z01818 Encounter for other preprocedural examination: Secondary | ICD-10-CM | POA: Insufficient documentation

## 2017-09-22 DIAGNOSIS — R931 Abnormal findings on diagnostic imaging of heart and coronary circulation: Secondary | ICD-10-CM | POA: Insufficient documentation

## 2017-09-22 MED ORDER — METOPROLOL TARTRATE 50 MG PO TABS: 50.0000 mg | ORAL_TABLET | Freq: Once | ORAL | 0 refills | Status: DC

## 2017-09-22 NOTE — Telephone Encounter (Signed)
-----   Message from Leonie Man, MD sent at 09/19/2017  3:50 PM EST ----- Coronary calcium score is mildly elevated at 111 dense amount of calcium noted in the left main and ostial LAD. Based on the fact the patient was having some atypical chest pain, I would recommend a coronary CT angiogram to follow this up.  Glenetta Hew, MD

## 2017-09-22 NOTE — Telephone Encounter (Signed)
Spoke to patient. Result given . Verbalized understanding Patient is in agreement , patient states he starts a new  Insurance at the first of the year. Will come by office for office to make copy.  Instruction letter will be given to patient. Patient aware he needs to obtain medication (lopressor/metoprolol 50 mg) and lab ( BMP) prior to CT angiogram.

## 2017-09-23 HISTORY — PX: OTHER SURGICAL HISTORY: SHX169

## 2017-10-07 ENCOUNTER — Other Ambulatory Visit: Payer: Self-pay

## 2017-10-07 MED ORDER — METOPROLOL TARTRATE 50 MG PO TABS: 50.0000 mg | ORAL_TABLET | Freq: Once | ORAL | 0 refills | Status: DC

## 2017-10-10 ENCOUNTER — Other Ambulatory Visit: Payer: Self-pay | Admitting: Emergency Medicine

## 2017-10-10 DIAGNOSIS — Z8679 Personal history of other diseases of the circulatory system: Secondary | ICD-10-CM

## 2017-10-14 LAB — BASIC METABOLIC PANEL
BUN/Creatinine Ratio: 19 (ref 10–24)
BUN: 16 mg/dL (ref 8–27)
CO2: 23 mmol/L (ref 20–29)
Calcium: 10.1 mg/dL (ref 8.6–10.2)
Chloride: 99 mmol/L (ref 96–106)
Creatinine, Ser: 0.85 mg/dL (ref 0.76–1.27)
GFR calc Af Amer: 103 mL/min/{1.73_m2} (ref >59)
GFR calc non Af Amer: 89 mL/min/{1.73_m2} (ref >59)
Glucose: 142 mg/dL — ABNORMAL HIGH (ref 65–99)
Potassium: 4.5 mmol/L (ref 3.5–5.2)
Sodium: 137 mmol/L (ref 134–144)

## 2017-10-17 ENCOUNTER — Ambulatory Visit (HOSPITAL_COMMUNITY): Payer: Medicare Other

## 2017-10-17 ENCOUNTER — Ambulatory Visit (HOSPITAL_COMMUNITY)
Admission: RE | Admit: 2017-10-17 | Discharge: 2017-10-17 | Disposition: A | Discharge status: Home / Self Care | Payer: Medicare Other | Source: Ambulatory Visit | Attending: Emergency Medicine | Admitting: Emergency Medicine

## 2017-10-17 DIAGNOSIS — I6523 Occlusion and stenosis of bilateral carotid arteries: Secondary | ICD-10-CM | POA: Insufficient documentation

## 2017-10-17 DIAGNOSIS — I6529 Occlusion and stenosis of unspecified carotid artery: Secondary | ICD-10-CM | POA: Diagnosis present

## 2017-10-17 DIAGNOSIS — Z8679 Personal history of other diseases of the circulatory system: Secondary | ICD-10-CM | POA: Diagnosis not present

## 2017-10-17 NOTE — Progress Notes (Signed)
Carotid duplex prelim: 1-39% ICA stenosis.  Jerelle Virden Eunice, RDMS, RVT   

## 2017-10-23 ENCOUNTER — Ambulatory Visit (HOSPITAL_COMMUNITY)
Admission: RE | Admit: 2017-10-23 | Discharge: 2017-10-23 | Disposition: A | Discharge status: Home / Self Care | Payer: Medicare Other | Source: Ambulatory Visit | Attending: Cardiology | Admitting: Cardiology

## 2017-10-23 DIAGNOSIS — I2510 Atherosclerotic heart disease of native coronary artery without angina pectoris: Secondary | ICD-10-CM | POA: Insufficient documentation

## 2017-10-23 DIAGNOSIS — E119 Type 2 diabetes mellitus without complications: Secondary | ICD-10-CM | POA: Insufficient documentation

## 2017-10-23 DIAGNOSIS — I700 Atherosclerosis of aorta: Secondary | ICD-10-CM | POA: Diagnosis not present

## 2017-10-23 DIAGNOSIS — R0789 Other chest pain: Secondary | ICD-10-CM | POA: Diagnosis not present

## 2017-10-23 DIAGNOSIS — Z01818 Encounter for other preprocedural examination: Secondary | ICD-10-CM | POA: Diagnosis not present

## 2017-10-23 DIAGNOSIS — R931 Abnormal findings on diagnostic imaging of heart and coronary circulation: Secondary | ICD-10-CM

## 2017-10-23 DIAGNOSIS — Z8249 Family history of ischemic heart disease and other diseases of the circulatory system: Secondary | ICD-10-CM

## 2017-10-23 DIAGNOSIS — R079 Chest pain, unspecified: Secondary | ICD-10-CM | POA: Diagnosis not present

## 2017-10-23 MED ORDER — METOPROLOL TARTRATE 5 MG/5ML IV SOLN
INTRAVENOUS | Status: AC
  Filled 2017-10-23: qty 5

## 2017-10-23 MED ORDER — IOPAMIDOL (ISOVUE-370) INJECTION 76%
INTRAVENOUS | Status: AC
  Filled 2017-10-23: qty 100

## 2017-10-23 MED ORDER — METOPROLOL TARTRATE 5 MG/5ML IV SOLN
5.0000 mg | INTRAVENOUS | Status: DC | PRN
  Administered 2017-10-23: 5 mg via INTRAVENOUS
  Filled 2017-10-23: qty 5

## 2017-10-23 MED ORDER — IOPAMIDOL (ISOVUE-370) INJECTION 76%
INTRAVENOUS | Status: AC
  Administered 2017-10-23: 80 mL
  Filled 2017-10-23: qty 100

## 2017-10-23 MED ORDER — NITROGLYCERIN 0.4 MG SL SUBL
0.8000 mg | SUBLINGUAL_TABLET | Freq: Once | SUBLINGUAL | Status: AC
  Administered 2017-10-23: 0.8 mg via SUBLINGUAL
  Filled 2017-10-23: qty 25

## 2017-10-23 MED ORDER — NITROGLYCERIN 0.4 MG SL SUBL
SUBLINGUAL_TABLET | SUBLINGUAL | Status: AC
  Filled 2017-10-23: qty 2

## 2017-10-24 ENCOUNTER — Ambulatory Visit: Payer: Medicare Other | Admitting: Cardiology

## 2017-10-27 ENCOUNTER — Encounter: Payer: Medicare Other | Admitting: Family Medicine

## 2017-11-01 ENCOUNTER — Other Ambulatory Visit: Payer: Self-pay | Admitting: Family Medicine

## 2017-11-04 ENCOUNTER — Ambulatory Visit (INDEPENDENT_AMBULATORY_CARE_PROVIDER_SITE_OTHER): Payer: Medicare Other | Admitting: Cardiology

## 2017-11-04 ENCOUNTER — Encounter: Payer: Self-pay | Admitting: Cardiology

## 2017-11-04 VITALS — BP 136/70 | HR 72 | Ht 68.25 in | Wt 216.0 lb

## 2017-11-04 DIAGNOSIS — I25119 Atherosclerotic heart disease of native coronary artery with unspecified angina pectoris: Secondary | ICD-10-CM | POA: Insufficient documentation

## 2017-11-04 DIAGNOSIS — R931 Abnormal findings on diagnostic imaging of heart and coronary circulation: Secondary | ICD-10-CM

## 2017-11-04 DIAGNOSIS — D689 Coagulation defect, unspecified: Secondary | ICD-10-CM

## 2017-11-04 DIAGNOSIS — E785 Hyperlipidemia, unspecified: Secondary | ICD-10-CM

## 2017-11-04 DIAGNOSIS — I10 Essential (primary) hypertension: Secondary | ICD-10-CM | POA: Diagnosis not present

## 2017-11-04 DIAGNOSIS — R0789 Other chest pain: Secondary | ICD-10-CM | POA: Diagnosis not present

## 2017-11-04 DIAGNOSIS — E8881 Metabolic syndrome: Secondary | ICD-10-CM

## 2017-11-04 DIAGNOSIS — I2510 Atherosclerotic heart disease of native coronary artery without angina pectoris: Secondary | ICD-10-CM | POA: Insufficient documentation

## 2017-11-04 DIAGNOSIS — E1169 Type 2 diabetes mellitus with other specified complication: Secondary | ICD-10-CM

## 2017-11-04 DIAGNOSIS — R9389 Abnormal findings on diagnostic imaging of other specified body structures: Secondary | ICD-10-CM | POA: Diagnosis not present

## 2017-11-04 DIAGNOSIS — Z8249 Family history of ischemic heart disease and other diseases of the circulatory system: Secondary | ICD-10-CM

## 2017-11-04 DIAGNOSIS — E119 Type 2 diabetes mellitus without complications: Secondary | ICD-10-CM | POA: Diagnosis not present

## 2017-11-04 DIAGNOSIS — Z789 Other specified health status: Secondary | ICD-10-CM | POA: Diagnosis not present

## 2017-11-04 DIAGNOSIS — I25709 Atherosclerosis of coronary artery bypass graft(s), unspecified, with unspecified angina pectoris: Secondary | ICD-10-CM | POA: Diagnosis not present

## 2017-11-04 DIAGNOSIS — I350 Nonrheumatic aortic (valve) stenosis: Secondary | ICD-10-CM

## 2017-11-04 DIAGNOSIS — Z01818 Encounter for other preprocedural examination: Secondary | ICD-10-CM | POA: Diagnosis not present

## 2017-11-04 MED ORDER — ROSUVASTATIN CALCIUM 10 MG PO TABS: 10.0000 mg | ORAL_TABLET | Freq: Every day | ORAL | 5 refills | Status: DC

## 2017-11-04 NOTE — Patient Instructions (Signed)
MEDICATION INSTRUCTIONS   START ROSUVASTATIN ( CRESTOR 10 MG ) ONE TABLET AT BEDTIME    SCHEDULE CARDIAC CATH AT [facility] Nov 12 2017 Your physician has requested that you have a cardiac catheterization. Cardiac catheterization is used to diagnose and/or treat various heart conditions. Doctors may recommend this procedure for a number of different reasons. The most common reason is to evaluate chest pain. Chest pain can be a symptom of coronary artery disease (CAD), and cardiac catheterization can show whether plaque is narrowing or blocking your heart's arteries. This procedure is also used to evaluate the valves, as well as measure the blood flow and oxygen levels in different parts of your heart. For further information please visit [email or web address]. Please follow instruction sheet, as given.   Your physician recommends that you schedule a follow-up appointment in 2-3 [place] [address] [place] [address] Dept: [phone] Loc: [place].  11/04/2017  You are scheduled for a Cardiac Catheterization on Wednesday, February 20 with Dr. Glenetta Hew.  1. Please arrive at the [facility] (Main Entrance A) at [facility]: [place],  [id number] at 9:00 AM (two hours before your procedure to ensure your preparation). Free valet parking service is available.   Special note: Every effort is made to have your procedure done on time. Please understand that emergencies sometimes delay scheduled procedures.  2. Diet: Do not eat or drink anything after midnight prior to your procedure except sips of water to take medications.  3. Labs: TODAY--CBC , BMP , PT  4. Medication instructions in preparation for your procedure  Stop taking, Glucophage (Metformin) on Thursday, February 19.    DO NOT TAKE  GLUCOTROL THE MORNING OF PROCEDURE On the morning of your procedure, take your Aspirin 81 MG  and any morning medicines NOT listed above.  You may use sips of water.  5. Plan for one night stay--bring personal belongings. 6. Bring a current list of your medications and current insurance cards. 7. You MUST have a responsible person to drive you home. 8. Someone MUST be with you the first 24 hours after you arrive home or your discharge will be delayed. 9. Please wear clothes that are easy to get on and off and wear slip-on shoes.  Thank you for allowing [place] to care for you!   -- [facility]

## 2017-11-04 NOTE — H&P (View-Only) (Signed)
PCP: Gregory Honour, MD  Clinic Note: Chief Complaint  Patient presents with  . Follow-up    Post echo and coronary CTA  . Coronary Artery Disease    Coronary calcification noted on coronary CT    HPI: Gregory Mathews. is a 72 y.o. male who is being seen today for follow-up evaluation of atypical chest pain with strong family history of CAD at the request of Gregory Honour, MD. PCP evaluation indicated some intermittent episodes of exertional chest pain and has a significant family history of CAD.  Because his family history, he is referred to cardiology evaluation.  Gregory Mathews is a very pleasant gentleman who has a pretty strong family history of CAD with father having had bypass surgery and stroke as well as both a sister and brother in their 32s and 65s having coronary disease.  He himself has never been diagnosed with coronary artery, but does have high blood pressure and diabetes mellitus along with hyperlipidemia.   He tells me that he is very active exercise about 20-56mnutes to an hour almost every day.   Gregory PDimas Mathews    Recent Hospitalizations: None  Studies Personally Reviewed - (if available, images/films reviewed: From Epic Chart or Care Everywhere)  Exercise Tolerance Test December 2016: Exercised for 9:01 min --> blunted blood pressure response no EKG changes.  No chest pain.  Low Risk  2D echo September 18, 2017: EF 55-60%.  Mild aortic stenosis (mean gradient 11 mmH.)  GR 1 DD.  Mild LA dilation.  Coronary CT Angiogram: Coronary calcium score 111. Coronary calcification noted in left main and proximal LAD.  Left main has less than 50% calcified stenosis.  Mid LAD 50-75% plaque noted CT FFR --> positive at 0.80.  Recommend cardiac catheterization.  Interval History: Gregory Mathews today actually feeling relatively well  He really notes that his occasional exertional dyspnea remains stable if not improved over the last few weeks.  He has not had any  further episodes of exertional chest discomfort - stating mostly that he really has not been very active since the last spell.   He still tries to takes the stairs at work - going up & down (4-5 flights) a couple times a day -- usually dose oK with this, just may have to stop occasionally to catch his breath.  This does change if he is carrying something up the steps, then he does get more SOB.  No further exertional chest discomfort.  Only mild exertional dyspnea and fatigue.  Remainder of cardiac review of symptoms: No PND, orthopnea or edema.  No palpitations, lightheadedness, dizziness, weakness or syncope/near syncope. No TIA/amaurosis fugax symptoms. No claudication.  ROS: A comprehensive was performed. Review of Systems  Constitutional: Negative for malaise/fatigue.  HENT: Negative for congestion and nosebleeds.   Respiratory: Negative for cough, shortness of breath and wheezing.   Gastrointestinal: Negative for blood in stool and melena.  Genitourinary: Negative for hematuria.  Musculoskeletal: Negative for falls.  Neurological: Negative for dizziness.  Psychiatric/Behavioral: Negative for depression and memory loss. The patient is not nervous/anxious and does not have insomnia.   All other systems reviewed and are negative.  I have reviewed and (if needed) personally updated the patient's problem list, medications, allergies, past medical and surgical history, social and family history.   Past Medical History:  Diagnosis Date  . Allergy   . Basal cell carcinoma 01/2013   N[facility]al [facility] R [facility]  . Cataract   .  Diabetes mellitus without complication (HCC)    on PO Meds  . Essential hypertension   . Hyperlipidemia    statin intolerant -- myalgias, fatigue (tried at least 4)  . Rosacea     Past Surgical History:  Procedure Laterality Date  . basal cell removal     nose and wrist   . BELPHAROPTOSIS REPAIR    . CORONARY CT ANGIOGRAM  09/2017   For  a calcium score was 111. Coronary calcification noted in the LEFT MAIN (LM) and prox LAD.  LM < 50% calcified stenosis.  Mid LAD 50-75% plaque noted CT FFR --> positive at 0.80.  Recommend CARDIAC CATHETERIZATION  . ETT/GXT: Graded Exercise Tolerance Test  08/2015    Exercised for 9:01 min --> blunted blood pressure response no EKG changes.  No chest pain.  Low Risk  . EYE SURGERY     Cataract surgery  . eyelid surgery    . SHOULDER OPEN ROTATOR CUFF REPAIR  1983  . TRANSTHORACIC ECHOCARDIOGRAM  08/2017   EF 55-60%.  Mild aortic stenosis (mean gradient 11 mmH.)  GR 1 DD.  Mild LA dilation.    Current Meds  Medication Sig  . aspirin 81 MG tablet Take 81 mg by mouth every other day.   . blood glucose meter kit and supplies Use to test blood sugar daily. Dx: E11.9  . glipiZIDE (GLUCOTROL) 5 MG tablet TAKE 1 TABLET BY MOUTH 2 TIMES DAILY BEFORE A MEAL.  Marland Kitchen Lancets MISC Use to test blood sugar daily. Dx code :E11.9  . losartan (COZAAR) 25 MG tablet Take 1 tablet (25 mg total) by mouth daily.  . metFORMIN (GLUCOPHAGE) 500 MG tablet TAKE 1 TABLET (500 MG TOTAL) BY MOUTH 2 (TWO) TIMES DAILY WITH A MEAL.  . Multiple Vitamin (MULTIVITAMIN) tablet Take 1 tablet by mouth daily.  . ONE TOUCH ULTRA TEST test strip TEST BLOOD SUGAR DAILY    Allergies  Allergen Reactions  . Lisinopril Cough  . Statins Other (See Comments)    Severe leg pain & fatigue -- tried at least 4 (even low dose)    Social History   Tobacco Use  . Smoking status: Former Smoker    Packs/day: 1.00    Years: 15.00    Pack years: 15.00    Last attempt to quit: 07/09/1982    Years since quitting: 35.3  . Smokeless tobacco: Never Used  Substance Use Topics  . Alcohol use: No  . Drug use: No   Social History   Social History Narrative   Marital status: married x 83 years; happily married.       Children:  4 children; 4 grandchildren. Adult children all live locally.      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      Tobacco:  In past; smoked x 15 years; quit 35 years ago.      Alcohol: none      Exercise:sporadic in 2017.      ADLs: independent with all ADLs.  Does not use assistant device with ambulation.      Living Will:  Has living will; desires FULL CODE; no prolonged measures.      Doctoral degree in Biblical Studies.       Social History family history includes Cancer in his brother and maternal grandmother; Cancer (age of onset: 34) in his father; Colon cancer (age of onset: 38)  in his father; Congestive Heart Failure in his brother; Coronary artery disease (age of onset: 51) in his father; Diabetes in his brother and sister; Diabetes (age of onset: 60) in his brother; Heart attack (age of onset: 43) in his brother; Leukemia in his mother; Peripheral Artery Disease in his father; Rheumatic fever in his brother; Stroke in his maternal grandfather; Stroke (age of onset: 37) in his father; Sudden Cardiac Death (age of onset: 53) in his sister.  Wt Readings from Last 3 Encounters:  11/04/17 216 lb (98 kg)  09/11/17 214 lb 12.8 oz (97.4 kg)  08/22/17 215 lb 3.2 oz (97.6 kg)    PHYSICAL EXAM BP 136/70   Pulse 72   Ht 5' 8.25" (1.734 m)   Wt 216 lb (98 kg)   BMI 32.60 kg/m  Physical Exam  Constitutional: He is oriented to person, place, and time. He appears well-developed and well-nourished. No distress.  Well-groomed.  Healthy-appearing.  Mildly obese  HENT:  Head: Normocephalic and atraumatic.  Mouth/Throat: No oropharyngeal exudate.  Eyes: Conjunctivae and EOM are normal. No scleral icterus.  History of cataract surgery.  Pupils are reactive, but not round or equal  Neck: Normal range of motion. Neck supple. No hepatojugular reflux and no JVD present. Carotid bruit is not present.  Cardiovascular: Normal rate, regular rhythm, intact distal pulses and normal pulses.  No extrasystoles are present. PMI is  not displaced. Exam reveals no gallop and no friction rub.  Murmur heard.  Medium-pitched harsh early systolic murmur is present with a grade of 1/6 at the upper right sternal border radiating to the neck. Soft systolic murmur heard in both the aortic and mitral position.  Appears to be systolic ejection  Pulmonary/Chest: Effort normal and breath sounds normal. No respiratory distress. He has no wheezes. He has no rales.  Abdominal: Soft. Bowel sounds are normal. He exhibits no distension. There is no tenderness. There is no rebound.  Musculoskeletal: Normal range of motion. He exhibits no edema.  Neurological: He is alert and oriented to person, place, and time. No cranial nerve deficit.  Skin: Skin is warm and dry. No rash noted. He is not diaphoretic.  Psychiatric: He has a normal mood and affect. His behavior is normal. Thought content normal.  Nursing note and vitals reviewed.    Adult ECG Report Not checked  Other studies Reviewed: Additional studies/ records that were reviewed today include:  Recent Labs:    Lab Results  Component Value Date   HGBA1C 7.1 (H) 08/22/2017   Lab Results  Component Value Date   CHOL 168 08/22/2017   HDL 38 (L) 08/22/2017   LDLCALC 106 (H) 08/22/2017   TRIG 118 08/22/2017   CHOLHDL 4.4 08/22/2017   Lab Results  Component Value Date   CREATININE 0.90 11/04/2017   BUN 19 11/04/2017   NA 141 11/04/2017   K 4.1 11/04/2017   CL 101 11/04/2017   CO2 20 11/04/2017    ASSESSMENT / PLAN: Problem List Items Addressed This Visit    Abnormal findings on diagnostic imaging of cardiovascular system (Chronic)    Concerning feature is that the lesion involves disease in the Left Main that was not significant, followed by proximal LAD disease. With LAD disease, think the best option is to proceed with cardiac catheterization plus/minus PCI.  Will need to add a statin and continue aspirin and ARB. Probably would benefit from beta blocker if indeed he  does have a lesion found on cardiac catheterization.  Relevant Orders   EKG 12-Lead (Completed)   Basic metabolic panel (Completed)   CBC (Completed)   Protime-INR (Completed)   LEFT HEART CATHETERIZATION WITH CORONARY ANGIOGRAM   Agatston coronary artery calcium score between 100 and 199   Relevant Orders   EKG 12-Lead (Completed)   LEFT HEART CATHETERIZATION WITH CORONARY ANGIOGRAM   Atypical chest pain    History symptoms did sound relatively atypical for angina, however it was exertional and improved with rest. Interestingly, the symptoms are not currently present, but he acknowledges that he probably has cut back a little bit since this evaluation is started.  Plan: Cardiac catheterization based on abnormal coronary CTA      Coronary artery disease involving coronary bypass graft of native heart with angina pectoris ([place])    Coronary CT angiogram shows significant disease in the LAD with positive FFR. He currently does not have any active anginal symptoms, but is not really back to his baseline level of exertion.  Plan: With abnormal coronary CTA and positive FFR, I think the best course of action is to proceed with cardiac catheterization to evaluate the proximal LAD lesion.  He is on ARB, but not beta blocker area  Continue aspirin and add statin.  He will need to be on a beta blocker post cath if he requires PCI      Relevant Medications   rosuvastatin (CRESTOR) 10 MG tablet   Other Relevant Orders   EKG 12-Lead (Completed)   Basic metabolic panel (Completed)   CBC (Completed)   Protime-INR (Completed)   LEFT HEART CATHETERIZATION WITH CORONARY ANGIOGRAM   Diabetes mellitus type 2, uncomplicated (HCC) (Chronic)   Relevant Medications   rosuvastatin (CRESTOR) 10 MG tablet   Essential hypertension (Chronic)   Relevant Medications   rosuvastatin (CRESTOR) 10 MG tablet   Family history of early CAD (Chronic)    Significant family history as previous noted with  father, brother and sister. Atypical chest discomfort symptoms and some mild exertional dyspnea, but relatively significant findings on coronary CTA.  Plan: Cardiac catheterization plus/minus PCI      Hyperlipidemia associated with type 2 diabetes mellitus ([place]) - Primary (Chronic)    Had intolerance to atorvastatin simvastatin.  With strong family history, metabolic syndrome and now clear-cut evidence of likely obstructive CAD, needs close control and will benefit from statin. Will start low-dose rosuvastatin -> if unable tolerate, will likely need PCSK9 inhibitor.      Relevant Medications   rosuvastatin (CRESTOR) 10 MG tablet   Metabolic syndrome (Chronic)    He has 3 features at least of metabolic syndrome including diabetes, hypertension and obesity. Now noted to have significant coronary artery disease/calcification on coronary CTA.  With significant family history, I think the prudent course of action is to proceed with cardiac catheterization and if necessary PCI to the LAD.  - Will try to gradually start statin, continue blood pressure control ARB and likely add beta blocker - Defer to PCP, but would benefit from" and control.      Mild aortic stenosis by prior echocardiogram (Chronic)    2-D echo did show mild aortic stenosis. Will probably need follow-up every couple years.      Relevant Medications   rosuvastatin (CRESTOR) 10 MG tablet   Pre-op testing   Relevant Orders   EKG 12-Lead (Completed)   Basic metabolic panel (Completed)   CBC (Completed)   Protime-INR (Completed)   Statin intolerance    He did poorly on simvastatin and atorvastatin in  the past. We will start him on low-dose rosuvastatin which is able gradually build up the dose.  May need to monitor closely with lipid clinic.       Other Visit Diagnoses    Clotting disorder ([place])       Relevant Orders   Protime-INR (Completed)     Performing MD:  Glenetta Hew, M.D., M.S.  Procedure:  Left  Heart Catheterization with Coronary Angiography and Possible Percutaneous Coronary Intervention  The procedure with Risks/Benefits/Alternatives and Indications was reviewed with the patient   All questions were answered.    Risks / Complications include, but not limited to: Death, MI, CVA/TIA, VF/VT (with defibrillation), Bradycardia (need for temporary pacer placement), contrast induced nephropathy, bleeding / bruising / hematoma / pseudoaneurysm, vascular or coronary injury (with possible emergent CT or Vascular Surgery), adverse medication reactions, infection.  Additional risks involving the use of radiation with the possibility of radiation burns and cancer were explained in detail.  The patient voices understanding and agree to proceed.      Current medicines are reviewed at length with the patient today. (+/- concerns) none The following changes have been made:None  Patient Instructions  MEDICATION INSTRUCTIONS   START ROSUVASTATIN ( CRESTOR 10 MG ) ONE TABLET AT BEDTIME  SCHEDULE CARDIAC CATH AT [facility] Nov 12 2017 Your physician has requested that you have a cardiac catheterization. Cardiac catheterization is used to diagnose and/or treat various heart conditions. Doctors may recommend this procedure for a number of different reasons. The most common reason is to evaluate chest pain. Chest pain can be a symptom of coronary artery disease (CAD), and cardiac catheterization can show whether plaque is narrowing or blocking your heart's arteries. This procedure is also used to evaluate the valves, as well as measure the blood flow and oxygen levels in different parts of your heart. For further information please visit [email or web address]. Please follow instruction sheet, as given.   Your physician recommends that you schedule a follow-up appointment in 2-3 WEEKS WITH DR Cesar Alf - POST CATH   Studies Ordered:   Orders Placed This Encounter  Procedures  . Basic metabolic panel   . CBC  . Protime-INR  . EKG 12-Lead  . LEFT HEART CATHETERIZATION WITH CORONARY Illene Silver, M.D., M.S. Interventional Cardiologist   Pager # [phone] Phone # [phone] [address] [place], [place] [address]   Thank you for choosing Heart[facility] at [facility]!!

## 2017-11-04 NOTE — Progress Notes (Signed)
PCP: Gregory Honour, MD  Clinic Note: Chief Complaint  Patient presents with  . Follow-up    Post echo and coronary CTA  . Coronary Artery Disease    Coronary calcification noted on coronary CT    HPI: Gregory Mathews. is a 72 y.o. male who is being seen today for follow-up evaluation of atypical chest pain with strong family history of CAD at the request of Gregory Honour, MD. PCP evaluation indicated some intermittent episodes of exertional chest pain and has a significant family history of CAD.  Because his family history, he is referred to cardiology evaluation.  Gregory Mathews is a very pleasant gentleman who has a pretty strong family history of CAD with father having had bypass surgery and stroke as well as both a sister and brother in their 32s and 65s having coronary disease.  He himself has never been diagnosed with coronary artery, but does have high blood pressure and diabetes mellitus along with hyperlipidemia.   He tells me that he is very active exercise about 20-56mnutes to an hour almost every day.   Gregory Mathews    Recent Hospitalizations: None  Studies Personally Reviewed - (if available, images/films reviewed: From Epic Chart or Care Everywhere)  Exercise Tolerance Test December 2016: Exercised for 9:01 min --> blunted blood pressure response no EKG changes.  No chest pain.  Low Risk  2D echo September 18, 2017: EF 55-60%.  Mild aortic stenosis (mean gradient 11 mmH.)  GR 1 DD.  Mild LA dilation.  Coronary CT Angiogram: Coronary calcium score 111. Coronary calcification noted in left main and proximal LAD.  Left main has less than 50% calcified stenosis.  Mid LAD 50-75% plaque noted CT FFR --> positive at 0.80.  Recommend cardiac catheterization.  Interval History: [name] today actually feeling relatively well  He really notes that his occasional exertional dyspnea remains stable if not improved over the last few weeks.  He has not had any  further episodes of exertional chest discomfort - stating mostly that he really has not been very active since the last spell.   He still tries to takes the stairs at work - going up & down (4-5 flights) a couple times a day -- usually dose oK with this, just may have to stop occasionally to catch his breath.  This does change if he is carrying something up the steps, then he does get more SOB.  No further exertional chest discomfort.  Only mild exertional dyspnea and fatigue.  Remainder of cardiac review of symptoms: No PND, orthopnea or edema.  No palpitations, lightheadedness, dizziness, weakness or syncope/near syncope. No TIA/amaurosis fugax symptoms. No claudication.  ROS: A comprehensive was performed. Review of Systems  Constitutional: Negative for malaise/fatigue.  HENT: Negative for congestion and nosebleeds.   Respiratory: Negative for cough, shortness of breath and wheezing.   Gastrointestinal: Negative for blood in stool and melena.  Genitourinary: Negative for hematuria.  Musculoskeletal: Negative for falls.  Neurological: Negative for dizziness.  Psychiatric/Behavioral: Negative for depression and memory loss. The patient is not nervous/anxious and does not have insomnia.   All other systems reviewed and are negative.  I have reviewed and (if needed) personally updated the patient's problem list, medications, allergies, past medical and surgical history, social and family history.   Past Medical History:  Diagnosis Date  . Allergy   . Basal cell carcinoma 01/2013   N[facility]al [facility] R [facility]  . Cataract   .  Diabetes mellitus without complication (HCC)    on PO Meds  . Essential hypertension   . Hyperlipidemia    statin intolerant -- myalgias, fatigue (tried at least 4)  . Rosacea     Past Surgical History:  Procedure Laterality Date  . basal cell removal     nose and wrist   . BELPHAROPTOSIS REPAIR    . CORONARY CT ANGIOGRAM  09/2017   For  a calcium score was 111. Coronary calcification noted in the LEFT MAIN (LM) and prox LAD.  LM < 50% calcified stenosis.  Mid LAD 50-75% plaque noted CT FFR --> positive at 0.80.  Recommend CARDIAC CATHETERIZATION  . ETT/GXT: Graded Exercise Tolerance Test  08/2015    Exercised for 9:01 min --> blunted blood pressure response no EKG changes.  No chest pain.  Low Risk  . EYE SURGERY     Cataract surgery  . eyelid surgery    . SHOULDER OPEN ROTATOR CUFF REPAIR  1983  . TRANSTHORACIC ECHOCARDIOGRAM  08/2017   EF 55-60%.  Mild aortic stenosis (mean gradient 11 mmH.)  GR 1 DD.  Mild LA dilation.    Current Meds  Medication Sig  . aspirin 81 MG tablet Take 81 mg by mouth every other day.   . blood glucose meter kit and supplies Use to test blood sugar daily. Dx: E11.9  . glipiZIDE (GLUCOTROL) 5 MG tablet TAKE 1 TABLET BY MOUTH 2 TIMES DAILY BEFORE A MEAL.  Marland Kitchen Lancets MISC Use to test blood sugar daily. Dx code :E11.9  . losartan (COZAAR) 25 MG tablet Take 1 tablet (25 mg total) by mouth daily.  . metFORMIN (GLUCOPHAGE) 500 MG tablet TAKE 1 TABLET (500 MG TOTAL) BY MOUTH 2 (TWO) TIMES DAILY WITH A MEAL.  . Multiple Vitamin (MULTIVITAMIN) tablet Take 1 tablet by mouth daily.  . ONE TOUCH ULTRA TEST test strip TEST BLOOD SUGAR DAILY    Allergies  Allergen Reactions  . Lisinopril Cough  . Statins Other (See Comments)    Severe leg pain & fatigue -- tried at least 4 (even low dose)    Social History   Tobacco Use  . Smoking status: Former Smoker    Packs/day: 1.00    Years: 15.00    Pack years: 15.00    Last attempt to quit: 07/09/1982    Years since quitting: 35.3  . Smokeless tobacco: Never Used  Substance Use Topics  . Alcohol use: No  . Drug use: No   Social History   Social History Narrative   Marital status: married x 83 years; happily married.       Children:  4 children; 4 grandchildren. Adult children all live locally.      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      Tobacco:  In past; smoked x 15 years; quit 35 years ago.      Alcohol: none      Exercise:sporadic in 2017.      ADLs: independent with all ADLs.  Does not use assistant device with ambulation.      Living Will:  Has living will; desires FULL CODE; no prolonged measures.      Doctoral degree in Biblical Studies.       Social History family history includes Cancer in his brother and maternal grandmother; Cancer (age of onset: 34) in his father; Colon cancer (age of onset: 38)  in his father; Congestive Heart Failure in his brother; Coronary artery disease (age of onset: 36) in his father; Diabetes in his brother and sister; Diabetes (age of onset: 3) in his brother; Heart attack (age of onset: 54) in his brother; Leukemia in his mother; Peripheral Artery Disease in his father; Rheumatic fever in his brother; Stroke in his maternal grandfather; Stroke (age of onset: 60) in his father; Sudden Cardiac Death (age of onset: 5) in his sister.  Wt Readings from Last 3 Encounters:  11/04/17 216 lb (98 kg)  09/11/17 214 lb 12.8 oz (97.4 kg)  08/22/17 215 lb 3.2 oz (97.6 kg)    PHYSICAL EXAM BP 136/70   Pulse 72   Ht 5' 8.25" (1.734 m)   Wt 216 lb (98 kg)   BMI 32.60 kg/m  Physical Exam  Constitutional: He is oriented to person, place, and time. He appears well-developed and well-nourished. No distress.  Well-groomed.  Healthy-appearing.  Mildly obese  HENT:  Head: Normocephalic and atraumatic.  Mouth/Throat: No oropharyngeal exudate.  Eyes: Conjunctivae and EOM are normal. No scleral icterus.  History of cataract surgery.  Pupils are reactive, but not round or equal  Neck: Normal range of motion. Neck supple. No hepatojugular reflux and no JVD present. Carotid bruit is not present.  Cardiovascular: Normal rate, regular rhythm, intact distal pulses and normal pulses.  No extrasystoles are present. PMI is  not displaced. Exam reveals no gallop and no friction rub.  Murmur heard.  Medium-pitched harsh early systolic murmur is present with a grade of 1/6 at the upper right sternal border radiating to the neck. Soft systolic murmur heard in both the aortic and mitral position.  Appears to be systolic ejection  Pulmonary/Chest: Effort normal and breath sounds normal. No respiratory distress. He has no wheezes. He has no rales.  Abdominal: Soft. Bowel sounds are normal. He exhibits no distension. There is no tenderness. There is no rebound.  Musculoskeletal: Normal range of motion. He exhibits no edema.  Neurological: He is alert and oriented to person, place, and time. No cranial nerve deficit.  Skin: Skin is warm and dry. No rash noted. He is not diaphoretic.  Psychiatric: He has a normal mood and affect. His behavior is normal. Thought content normal.  Nursing note and vitals reviewed.    Adult ECG Report Not checked  Other studies Reviewed: Additional studies/ records that were reviewed today include:  Recent Labs:    Lab Results  Component Value Date   HGBA1C 7.1 (H) 08/22/2017   Lab Results  Component Value Date   CHOL 168 08/22/2017   HDL 38 (L) 08/22/2017   LDLCALC 106 (H) 08/22/2017   TRIG 118 08/22/2017   CHOLHDL 4.4 08/22/2017   Lab Results  Component Value Date   CREATININE 0.90 11/04/2017   BUN 19 11/04/2017   NA 141 11/04/2017   K 4.1 11/04/2017   CL 101 11/04/2017   CO2 20 11/04/2017    ASSESSMENT / PLAN: Problem List Items Addressed This Visit    Abnormal findings on diagnostic imaging of cardiovascular system (Chronic)    Concerning feature is that the lesion involves disease in the Left Main that was not significant, followed by proximal LAD disease. With LAD disease, think the best option is to proceed with cardiac catheterization plus/minus PCI.  Will need to add a statin and continue aspirin and ARB. Probably would benefit from beta blocker if indeed he  does have a lesion found on cardiac catheterization.  Relevant Orders   EKG 12-Lead (Completed)   Basic metabolic panel (Completed)   CBC (Completed)   Protime-INR (Completed)   LEFT HEART CATHETERIZATION WITH CORONARY ANGIOGRAM   Agatston coronary artery calcium score between 100 and 199   Relevant Orders   EKG 12-Lead (Completed)   LEFT HEART CATHETERIZATION WITH CORONARY ANGIOGRAM   Atypical chest pain    History symptoms did sound relatively atypical for angina, however it was exertional and improved with rest. Interestingly, the symptoms are not currently present, but he acknowledges that he probably has cut back a little bit since this evaluation is started.  Plan: Cardiac catheterization based on abnormal coronary CTA      Coronary artery disease involving coronary bypass graft of native heart with angina pectoris ([place])    Coronary CT angiogram shows significant disease in the LAD with positive FFR. He currently does not have any active anginal symptoms, but is not really back to his baseline level of exertion.  Plan: With abnormal coronary CTA and positive FFR, I think the best course of action is to proceed with cardiac catheterization to evaluate the proximal LAD lesion.  He is on ARB, but not beta blocker area  Continue aspirin and add statin.  He will need to be on a beta blocker post cath if he requires PCI      Relevant Medications   rosuvastatin (CRESTOR) 10 MG tablet   Other Relevant Orders   EKG 12-Lead (Completed)   Basic metabolic panel (Completed)   CBC (Completed)   Protime-INR (Completed)   LEFT HEART CATHETERIZATION WITH CORONARY ANGIOGRAM   Diabetes mellitus type 2, uncomplicated (HCC) (Chronic)   Relevant Medications   rosuvastatin (CRESTOR) 10 MG tablet   Essential hypertension (Chronic)   Relevant Medications   rosuvastatin (CRESTOR) 10 MG tablet   Family history of early CAD (Chronic)    Significant family history as previous noted with  father, brother and sister. Atypical chest discomfort symptoms and some mild exertional dyspnea, but relatively significant findings on coronary CTA.  Plan: Cardiac catheterization plus/minus PCI      Hyperlipidemia associated with type 2 diabetes mellitus ([place]) - Primary (Chronic)    Had intolerance to atorvastatin simvastatin.  With strong family history, metabolic syndrome and now clear-cut evidence of likely obstructive CAD, needs close control and will benefit from statin. Will start low-dose rosuvastatin -> if unable tolerate, will likely need PCSK9 inhibitor.      Relevant Medications   rosuvastatin (CRESTOR) 10 MG tablet   Metabolic syndrome (Chronic)    He has 3 features at least of metabolic syndrome including diabetes, hypertension and obesity. Now noted to have significant coronary artery disease/calcification on coronary CTA.  With significant family history, I think the prudent course of action is to proceed with cardiac catheterization and if necessary PCI to the LAD.  - Will try to gradually start statin, continue blood pressure control ARB and likely add beta blocker - Defer to PCP, but would benefit from" and control.      Mild aortic stenosis by prior echocardiogram (Chronic)    2-D echo did show mild aortic stenosis. Will probably need follow-up every couple years.      Relevant Medications   rosuvastatin (CRESTOR) 10 MG tablet   Pre-op testing   Relevant Orders   EKG 12-Lead (Completed)   Basic metabolic panel (Completed)   CBC (Completed)   Protime-INR (Completed)   Statin intolerance    He did poorly on simvastatin and atorvastatin in  the past. We will start him on low-dose rosuvastatin which is able gradually build up the dose.  May need to monitor closely with lipid clinic.       Other Visit Diagnoses    Clotting disorder ([place])       Relevant Orders   Protime-INR (Completed)     Performing MD:  Glenetta Hew, M.D., M.S.  Procedure:  Left  Heart Catheterization with Coronary Angiography and Possible Percutaneous Coronary Intervention  The procedure with Risks/Benefits/Alternatives and Indications was reviewed with the patient   All questions were answered.    Risks / Complications include, but not limited to: Death, MI, CVA/TIA, VF/VT (with defibrillation), Bradycardia (need for temporary pacer placement), contrast induced nephropathy, bleeding / bruising / hematoma / pseudoaneurysm, vascular or coronary injury (with possible emergent CT or Vascular Surgery), adverse medication reactions, infection.  Additional risks involving the use of radiation with the possibility of radiation burns and cancer were explained in detail.  The patient voices understanding and agree to proceed.      Current medicines are reviewed at length with the patient today. (+/- concerns) none The following changes have been made:None  Patient Instructions  MEDICATION INSTRUCTIONS   START ROSUVASTATIN ( CRESTOR 10 MG ) ONE TABLET AT BEDTIME  SCHEDULE CARDIAC CATH AT [facility] Nov 12 2017 Your physician has requested that you have a cardiac catheterization. Cardiac catheterization is used to diagnose and/or treat various heart conditions. Doctors may recommend this procedure for a number of different reasons. The most common reason is to evaluate chest pain. Chest pain can be a symptom of coronary artery disease (CAD), and cardiac catheterization can show whether plaque is narrowing or blocking your heart's arteries. This procedure is also used to evaluate the valves, as well as measure the blood flow and oxygen levels in different parts of your heart. For further information please visit [email or web address]. Please follow instruction sheet, as given.   Your physician recommends that you schedule a follow-up appointment in 2-3 WEEKS WITH DR Mitzie Marlar - POST CATH   Studies Ordered:   Orders Placed This Encounter  Procedures  . Basic metabolic panel   . CBC  . Protime-INR  . EKG 12-Lead  . LEFT HEART CATHETERIZATION WITH CORONARY Illene Silver, M.D., M.S. Interventional Cardiologist   Pager # [phone] Phone # [phone] [address] [place], [place] [address]   Thank you for choosing Heart[facility] at [facility]!!

## 2017-11-05 ENCOUNTER — Encounter: Payer: Self-pay | Admitting: Cardiology

## 2017-11-05 LAB — BASIC METABOLIC PANEL
BUN / CREAT RATIO: 21 (ref 10–24)
BUN: 19 mg/dL (ref 8–27)
CO2: 20 mmol/L (ref 20–29)
CREATININE: 0.9 mg/dL (ref 0.76–1.27)
Calcium: 9.9 mg/dL (ref 8.6–10.2)
Chloride: 101 mmol/L (ref 96–106)
GFR calc Af Amer: 100 mL/min/{1.73_m2} (ref >59)
GFR, EST NON AFRICAN AMERICAN: 87 mL/min/{1.73_m2} (ref >59)
GLUCOSE: 117 mg/dL — AB (ref 65–99)
Potassium: 4.1 mmol/L (ref 3.5–5.2)
SODIUM: 141 mmol/L (ref 134–144)

## 2017-11-05 LAB — PROTIME-INR
INR: 1 (ref 0.8–1.2)
Prothrombin Time: 10.4 s (ref 9.1–12.0)

## 2017-11-05 LAB — CBC
HEMATOCRIT: 40.7 % (ref 37.5–51.0)
Hemoglobin: 13.3 g/dL (ref 13.0–17.7)
MCH: 30.2 pg (ref 26.6–33.0)
MCHC: 32.7 g/dL (ref 31.5–35.7)
MCV: 92 fL (ref 79–97)
Platelets: 244 10*3/uL (ref 150–379)
RBC: 4.41 x10E6/uL (ref 4.14–5.80)
RDW: 13.6 % (ref 12.3–15.4)
WBC: 8.6 10*3/uL (ref 3.4–10.8)

## 2017-11-05 NOTE — Assessment & Plan Note (Addendum)
Coronary CT angiogram shows significant disease in the LAD with positive FFR. He currently does not have any active anginal symptoms, but is not really back to his baseline level of exertion.  Plan: With abnormal coronary CTA and positive FFR, I think the best course of action is to proceed with cardiac catheterization to evaluate the proximal LAD lesion.  He is on ARB, but not beta blocker area  Continue aspirin and add statin.  He will need to be on a beta blocker post cath if he requires PCI

## 2017-11-05 NOTE — Assessment & Plan Note (Signed)
Concerning feature is that the lesion involves disease in the Left Main that was not significant, followed by proximal LAD disease. With LAD disease, think the best option is to proceed with cardiac catheterization plus/minus PCI.  Will need to add a statin and continue aspirin and ARB. Probably would benefit from beta blocker if indeed he does have a lesion found on cardiac catheterization.

## 2017-11-05 NOTE — Assessment & Plan Note (Addendum)
He has 3 features at least of metabolic syndrome including diabetes, hypertension and obesity. Now noted to have significant coronary artery disease/calcification on coronary CTA.  With significant family history, I think the prudent course of action is to proceed with cardiac catheterization and if necessary PCI to the LAD.  - Will try to gradually start statin, continue blood pressure control ARB and likely add beta blocker - Defer to PCP, but would benefit from" and control.

## 2017-11-05 NOTE — Assessment & Plan Note (Signed)
2-D echo did show mild aortic stenosis. Will probably need follow-up every couple years.

## 2017-11-05 NOTE — Assessment & Plan Note (Signed)
History symptoms did sound relatively atypical for angina, however it was exertional and improved with rest. Interestingly, the symptoms are not currently present, but he acknowledges that he probably has cut back a little bit since this evaluation is started.  Plan: Cardiac catheterization based on abnormal coronary CTA

## 2017-11-05 NOTE — Assessment & Plan Note (Addendum)
Significant family history as previous noted with father, brother and sister. Atypical chest discomfort symptoms and some mild exertional dyspnea, but relatively significant findings on coronary CTA.  Plan: Cardiac catheterization plus/minus PCI

## 2017-11-05 NOTE — Assessment & Plan Note (Signed)
He did poorly on simvastatin and atorvastatin in the past. We will start him on low-dose rosuvastatin which is able gradually build up the dose.  May need to monitor closely with lipid clinic.

## 2017-11-05 NOTE — Assessment & Plan Note (Signed)
Had intolerance to atorvastatin simvastatin.  With strong family history, metabolic syndrome and now clear-cut evidence of likely obstructive CAD, needs close control and will benefit from statin. Will start low-dose rosuvastatin -> if unable tolerate, will likely need PCSK9 inhibitor.

## 2017-11-06 ENCOUNTER — Other Ambulatory Visit: Payer: Self-pay | Admitting: *Deleted

## 2017-11-06 DIAGNOSIS — Z01818 Encounter for other preprocedural examination: Secondary | ICD-10-CM

## 2017-11-06 DIAGNOSIS — R9389 Abnormal findings on diagnostic imaging of other specified body structures: Secondary | ICD-10-CM

## 2017-11-06 NOTE — Progress Notes (Signed)
ORDER PLACED  FOR CATH 11/12/17

## 2017-11-07 ENCOUNTER — Telehealth: Payer: Self-pay | Admitting: *Deleted

## 2017-11-07 NOTE — Telephone Encounter (Addendum)
LEFT MESSAGE DPR-RESULTS

## 2017-11-07 NOTE — Telephone Encounter (Signed)
Spoke to patient. Result given . Verbalized understanding  

## 2017-11-07 NOTE — Telephone Encounter (Signed)
-----   Message from Leonie Man, MD sent at 11/05/2017  9:39 PM EST ----- Precath labs look good.  Kidney functions okay.  Blood counts are okay.  Blood sugars are little bit high.  Need to watch.  Normal blood thinning levels.  Glenetta Hew, MD

## 2017-11-10 ENCOUNTER — Telehealth: Payer: Self-pay | Admitting: *Deleted

## 2017-11-10 NOTE — Telephone Encounter (Signed)
Pt contacted pre-catheterization scheduled at [facility] for: Wednesday November 12, 2017 at 11:30 AM Verified arrival time and place: [place] at: 9 AM Nothing to eat or drink after midnight the night before the cath.  Hold: No metformin 11/11/17, 11/12/17 and for 48 hours post cath- pt states he has not taken this in about a week. No glipizide pm before  cath  Except hold medications AM meds can be  taken pre-cath with sip of water including: ASA 81 mg am of cath   Confirmed patient has responsible person to drive home post procedure and observe patient for 24 hours:  yes

## 2017-11-12 ENCOUNTER — Ambulatory Visit (HOSPITAL_COMMUNITY)
Admission: RE | Admit: 2017-11-12 | Discharge: 2017-11-12 | Disposition: A | Discharge status: Home / Self Care | Payer: Medicare Other | Source: Ambulatory Visit | Attending: Cardiology | Admitting: Cardiology

## 2017-11-12 ENCOUNTER — Ambulatory Visit (HOSPITAL_COMMUNITY)
Admission: RE | Disposition: A | Discharge status: Home / Self Care | Payer: Self-pay | Source: Ambulatory Visit | Attending: Cardiology

## 2017-11-12 DIAGNOSIS — Z8249 Family history of ischemic heart disease and other diseases of the circulatory system: Secondary | ICD-10-CM | POA: Diagnosis not present

## 2017-11-12 DIAGNOSIS — Z833 Family history of diabetes mellitus: Secondary | ICD-10-CM | POA: Diagnosis not present

## 2017-11-12 DIAGNOSIS — Z85828 Personal history of other malignant neoplasm of skin: Secondary | ICD-10-CM | POA: Diagnosis not present

## 2017-11-12 DIAGNOSIS — I2584 Coronary atherosclerosis due to calcified coronary lesion: Secondary | ICD-10-CM | POA: Diagnosis not present

## 2017-11-12 DIAGNOSIS — Z7982 Long term (current) use of aspirin: Secondary | ICD-10-CM | POA: Insufficient documentation

## 2017-11-12 DIAGNOSIS — Z87891 Personal history of nicotine dependence: Secondary | ICD-10-CM | POA: Insufficient documentation

## 2017-11-12 DIAGNOSIS — E119 Type 2 diabetes mellitus without complications: Secondary | ICD-10-CM | POA: Insufficient documentation

## 2017-11-12 DIAGNOSIS — Z888 Allergy status to other drugs, medicaments and biological substances status: Secondary | ICD-10-CM | POA: Insufficient documentation

## 2017-11-12 DIAGNOSIS — I2510 Atherosclerotic heart disease of native coronary artery without angina pectoris: Secondary | ICD-10-CM | POA: Diagnosis present

## 2017-11-12 DIAGNOSIS — Z79899 Other long term (current) drug therapy: Secondary | ICD-10-CM | POA: Diagnosis not present

## 2017-11-12 DIAGNOSIS — I10 Essential (primary) hypertension: Secondary | ICD-10-CM | POA: Diagnosis not present

## 2017-11-12 DIAGNOSIS — Z955 Presence of coronary angioplasty implant and graft: Secondary | ICD-10-CM

## 2017-11-12 DIAGNOSIS — Z7984 Long term (current) use of oral hypoglycemic drugs: Secondary | ICD-10-CM | POA: Diagnosis not present

## 2017-11-12 DIAGNOSIS — Z823 Family history of stroke: Secondary | ICD-10-CM | POA: Insufficient documentation

## 2017-11-12 DIAGNOSIS — I25118 Atherosclerotic heart disease of native coronary artery with other forms of angina pectoris: Secondary | ICD-10-CM | POA: Diagnosis not present

## 2017-11-12 DIAGNOSIS — I25119 Atherosclerotic heart disease of native coronary artery with unspecified angina pectoris: Secondary | ICD-10-CM | POA: Diagnosis present

## 2017-11-12 DIAGNOSIS — Z01818 Encounter for other preprocedural examination: Secondary | ICD-10-CM

## 2017-11-12 DIAGNOSIS — E785 Hyperlipidemia, unspecified: Secondary | ICD-10-CM | POA: Diagnosis not present

## 2017-11-12 DIAGNOSIS — R9389 Abnormal findings on diagnostic imaging of other specified body structures: Secondary | ICD-10-CM

## 2017-11-12 HISTORY — PX: CORONARY STENT INTERVENTION: CATH118234

## 2017-11-12 HISTORY — PX: CORONARY PRESSURE/FFR STUDY: CATH118243

## 2017-11-12 HISTORY — PX: LEFT HEART CATH AND CORONARY ANGIOGRAPHY: CATH118249

## 2017-11-12 LAB — POCT ACTIVATED CLOTTING TIME
ACTIVATED CLOTTING TIME: 230 s
ACTIVATED CLOTTING TIME: 241 s
Activated Clotting Time: 274 seconds

## 2017-11-12 LAB — GLUCOSE, CAPILLARY: Glucose-Capillary: 142 mg/dL — ABNORMAL HIGH (ref 65–99)

## 2017-11-12 SURGERY — LEFT HEART CATH AND CORONARY ANGIOGRAPHY
Anesthesia: LOCAL

## 2017-11-12 MED ORDER — HEPARIN (PORCINE) IN NACL 2-0.9 UNIT/ML-% IJ SOLN
INTRAMUSCULAR | Status: AC
  Filled 2017-11-12: qty 1000

## 2017-11-12 MED ORDER — CLOPIDOGREL BISULFATE 75 MG PO TABS: 75.0000 mg | ORAL_TABLET | Freq: Every day | ORAL | Status: DC

## 2017-11-12 MED ORDER — MIDAZOLAM HCL 2 MG/2ML IJ SOLN
INTRAMUSCULAR | Status: AC
  Filled 2017-11-12: qty 2

## 2017-11-12 MED ORDER — SODIUM CHLORIDE 0.9% FLUSH: 3.0000 mL | INTRAVENOUS | Status: DC | PRN

## 2017-11-12 MED ORDER — ADENOSINE 12 MG/4ML IV SOLN
INTRAVENOUS | Status: AC
  Filled 2017-11-12: qty 4

## 2017-11-12 MED ORDER — SODIUM CHLORIDE 0.9 % IV SOLN: 250.0000 mL | INTRAVENOUS | Status: DC | PRN

## 2017-11-12 MED ORDER — LIDOCAINE HCL (PF) 1 % IJ SOLN
INTRAMUSCULAR | Status: AC
  Filled 2017-11-12: qty 30

## 2017-11-12 MED ORDER — NITROGLYCERIN 0.4 MG SL SUBL: 0.4000 mg | SUBLINGUAL_TABLET | SUBLINGUAL | 2 refills | Status: DC | PRN

## 2017-11-12 MED ORDER — SODIUM CHLORIDE 0.9% FLUSH: 3.0000 mL | Freq: Two times a day (BID) | INTRAVENOUS | Status: DC

## 2017-11-12 MED ORDER — ADENOSINE 6 MG/2ML IV SOLN
INTRAVENOUS | Status: AC
  Filled 2017-11-12: qty 2

## 2017-11-12 MED ORDER — HEPARIN SODIUM (PORCINE) 1000 UNIT/ML IJ SOLN
INTRAMUSCULAR | Status: DC | PRN
  Administered 2017-11-12: 2000 [IU] via INTRAVENOUS
  Administered 2017-11-12 (×2): 5000 [IU] via INTRAVENOUS
  Administered 2017-11-12: 3000 [IU] via INTRAVENOUS

## 2017-11-12 MED ORDER — FENTANYL CITRATE (PF) 100 MCG/2ML IJ SOLN
INTRAMUSCULAR | Status: DC | PRN
  Administered 2017-11-12: 50 ug via INTRAVENOUS

## 2017-11-12 MED ORDER — LIDOCAINE HCL (PF) 1 % IJ SOLN
INTRAMUSCULAR | Status: DC | PRN
  Administered 2017-11-12: 2 mL

## 2017-11-12 MED ORDER — IOPAMIDOL (ISOVUE-370) INJECTION 76%
INTRAVENOUS | Status: DC | PRN
  Administered 2017-11-12: 174 mL via INTRA_ARTERIAL

## 2017-11-12 MED ORDER — ASPIRIN 81 MG PO TABS: 81.0000 mg | ORAL_TABLET | ORAL | Status: DC

## 2017-11-12 MED ORDER — METOPROLOL SUCCINATE ER 25 MG PO TB24: 25.0000 mg | ORAL_TABLET | Freq: Every day | ORAL | 2 refills | Status: DC

## 2017-11-12 MED ORDER — ONDANSETRON HCL 4 MG/2ML IJ SOLN: 4.0000 mg | Freq: Four times a day (QID) | INTRAMUSCULAR | Status: DC | PRN

## 2017-11-12 MED ORDER — CLOPIDOGREL BISULFATE 300 MG PO TABS
ORAL_TABLET | ORAL | Status: AC
  Filled 2017-11-12: qty 1

## 2017-11-12 MED ORDER — ACETAMINOPHEN 325 MG PO TABS: 650.0000 mg | ORAL_TABLET | ORAL | Status: DC | PRN

## 2017-11-12 MED ORDER — VERAPAMIL HCL 2.5 MG/ML IV SOLN
INTRAVENOUS | Status: DC | PRN
  Administered 2017-11-12: 10 mL via INTRA_ARTERIAL

## 2017-11-12 MED ORDER — HYDRALAZINE HCL 20 MG/ML IJ SOLN: 5.0000 mg | INTRAMUSCULAR | Status: DC | PRN

## 2017-11-12 MED ORDER — ADENOSINE (DIAGNOSTIC) 140MCG/KG/MIN
INTRAVENOUS | Status: DC | PRN
  Administered 2017-11-12: 140 ug/kg/min via INTRAVENOUS

## 2017-11-12 MED ORDER — CLOPIDOGREL BISULFATE 300 MG PO TABS
ORAL_TABLET | ORAL | Status: DC | PRN
  Administered 2017-11-12: 600 mg via ORAL

## 2017-11-12 MED ORDER — MIDAZOLAM HCL 2 MG/2ML IJ SOLN
INTRAMUSCULAR | Status: DC | PRN
  Administered 2017-11-12: 2 mg via INTRAVENOUS

## 2017-11-12 MED ORDER — IOPAMIDOL (ISOVUE-370) INJECTION 76%
INTRAVENOUS | Status: AC
  Filled 2017-11-12: qty 100

## 2017-11-12 MED ORDER — HEPARIN (PORCINE) IN NACL 2-0.9 UNIT/ML-% IJ SOLN
INTRAMUSCULAR | Status: AC
  Filled 2017-11-12: qty 500

## 2017-11-12 MED ORDER — SODIUM CHLORIDE 0.9 % IV SOLN
INTRAVENOUS | Status: DC
  Administered 2017-11-12: 10:00:00 via INTRAVENOUS

## 2017-11-12 MED ORDER — SODIUM CHLORIDE 0.9 % IV SOLN: INTRAVENOUS | Status: DC

## 2017-11-12 MED ORDER — CLOPIDOGREL BISULFATE 75 MG PO TABS: 75.0000 mg | ORAL_TABLET | Freq: Every day | ORAL | 0 refills | Status: DC

## 2017-11-12 MED ORDER — ANGIOPLASTY BOOK
Freq: Once | Status: DC
  Filled 2017-11-12 (×2): qty 1

## 2017-11-12 MED ORDER — HEPARIN SODIUM (PORCINE) 1000 UNIT/ML IJ SOLN
INTRAMUSCULAR | Status: AC
  Filled 2017-11-12: qty 1

## 2017-11-12 MED ORDER — FENTANYL CITRATE (PF) 100 MCG/2ML IJ SOLN
INTRAMUSCULAR | Status: AC
  Filled 2017-11-12: qty 2

## 2017-11-12 MED ORDER — LABETALOL HCL 5 MG/ML IV SOLN: 10.0000 mg | INTRAVENOUS | Status: DC | PRN

## 2017-11-12 MED ORDER — HEPARIN (PORCINE) IN NACL 2-0.9 UNIT/ML-% IJ SOLN
INTRAMUSCULAR | Status: AC | PRN
  Administered 2017-11-12: 500 mL

## 2017-11-12 MED ORDER — ROSUVASTATIN CALCIUM 10 MG PO TABS: 10.0000 mg | ORAL_TABLET | Freq: Every day | ORAL | Status: DC

## 2017-11-12 MED ORDER — FAMOTIDINE IN NACL 20-0.9 MG/50ML-% IV SOLN
INTRAVENOUS | Status: AC | PRN
  Administered 2017-11-12: 20 mg via INTRAVENOUS

## 2017-11-12 MED ORDER — VERAPAMIL HCL 2.5 MG/ML IV SOLN
INTRAVENOUS | Status: AC
  Filled 2017-11-12: qty 2

## 2017-11-12 MED FILL — CLOPIDOGREL 75 MG TABLET: 75 | 30 days supply | Qty: 30 | Fill #0

## 2017-11-12 SURGICAL SUPPLY — 23 items
BALLN SAPPHIRE 2.5X15 (BALLOONS) ×2
BALLN SAPPHIRE ~~LOC~~ 3.0X10 (BALLOONS) ×1 IMPLANT
BALLOON SAPPHIRE 2.5X15 (BALLOONS) IMPLANT
CATH LAUNCHER 6FR EBU3.5 (CATHETERS) ×1 IMPLANT
CATH MICROCATH NAVVUS (MICROCATHETER) IMPLANT
CATH OPTITORQUE TIG 4.0 5F (CATHETERS) ×1 IMPLANT
DEVICE RAD COMP TR BAND LRG (VASCULAR PRODUCTS) ×1 IMPLANT
GLIDESHEATH SLEND SS 6F .021 (SHEATH) ×1 IMPLANT
GUIDEWIRE INQWIRE 1.5J.035X260 (WIRE) IMPLANT
INQWIRE 1.5J .035X260CM (WIRE) ×2
KIT ENCORE 26 ADVANTAGE (KITS) ×1 IMPLANT
KIT ESSENTIALS PG (KITS) ×1 IMPLANT
KIT HEART LEFT (KITS) ×2 IMPLANT
KIT PREMIUM HAND CONTROLLER (KITS) ×1 IMPLANT
KIT SINGLE USE MANIFOLD (KITS) ×1 IMPLANT
MICROCATHETER NAVVUS (MICROCATHETER) ×2
PACK CARDIAC CATHETERIZATION (CUSTOM PROCEDURE TRAY) ×2 IMPLANT
PROTECTION STATION PRESSURIZED (MISCELLANEOUS) ×2
STATION PROTECTION PRESSURIZED (MISCELLANEOUS) IMPLANT
STENT SIERRA 2.75 X 18 MM (Permanent Stent) ×1 IMPLANT
TRANSDUCER W/STOPCOCK (MISCELLANEOUS) ×2 IMPLANT
TUBING CIL FLEX 10 FLL-RA (TUBING) ×2 IMPLANT
WIRE ASAHI PROWATER 180CM (WIRE) ×1 IMPLANT

## 2017-11-12 NOTE — Discharge Instructions (Signed)
Radial Site Care Refer to this sheet in the next few weeks. These instructions provide you with information on caring for yourself after your procedure. Your caregiver may also give you more specific instructions. Your treatment has been planned according to current medical practices, but problems sometimes occur. Call your caregiver if you have any problems or questions after your procedure. HOME CARE INSTRUCTIONS You may shower the day after the procedure.Remove the bandage (dressing) and gently wash the site with plain soap and water.Gently pat the site dry.  Do not apply powder or lotion to the site.  Do not submerge the affected site in water for 3 to 5 days.  Inspect the site at least twice daily.  Do not flex or bend the affected arm for 24 hours.  No lifting over 5 pounds (2.3 kg) for 5 days after your procedure.  Do not drive home if you are discharged the same day of the procedure. Have someone else drive you.  You may drive 24 hours after the procedure unless otherwise instructed by your caregiver.  What to expect: Any bruising will usually fade within 1 to 2 weeks.  Blood that collects in the tissue (hematoma) may be painful to the touch. It should usually decrease in size and tenderness within 1 to 2 weeks.  SEEK IMMEDIATE MEDICAL CARE IF: You have unusual pain at the radial site.  You have redness, warmth, swelling, or pain at the radial site.  You have drainage (other than a small amount of blood on the dressing).  You have chills.  You have a fever or persistent symptoms for more than 72 hours.  You have a fever and your symptoms suddenly get worse.  Your arm becomes pale, cool, tingly, or numb.  You have heavy bleeding from the site. Hold pressure on the site.   PLEASE DO NOT MISS ANY DOSES OF YOUR PLAVIX!!!!! Also keep a log of you blood pressures and bring back to your follow up appt. Please call the office with any questions.   Patients taking blood thinners should  generally stay away from medicines like ibuprofen, Advil, Motrin, naproxen, and Aleve due to risk of stomach bleeding. You may take Tylenol as directed or talk to your primary doctor about alternatives.   Radial Site Care Refer to this sheet in the next few weeks. These instructions provide you with information about caring for yourself after your procedure. Your health care provider may also give you more specific instructions. Your treatment has been planned according to current medical practices, but problems sometimes occur. Call your health care provider if you have any problems or questions after your procedure. What can I expect after the procedure? After your procedure, it is typical to have the following:  Bruising at the radial site that usually fades within 1-2 weeks.  Blood collecting in the tissue (hematoma) that may be painful to the touch. It should usually decrease in size and tenderness within 1-2 weeks.  Follow these instructions at home:  Take medicines only as directed by your health care provider.  You may shower 24-48 hours after the procedure or as directed by your health care provider. Remove the bandage (dressing) and gently wash the site with plain soap and water. Pat the area dry with a clean towel. Do not rub the site, because this may cause bleeding.  Do not take baths, swim, or use a hot tub until your health care provider approves.  Check your insertion site every day for redness,  swelling, or drainage.  Do not apply powder or lotion to the site.  Do not flex or bend the affected arm for 24 hours or as directed by your health care provider.  Do not push or pull heavy objects with the affected arm for 24 hours or as directed by your health care provider.  Do not lift over 10 lb (4.5 kg) for 5 days after your procedure or as directed by your health care provider.  Ask your health care provider when it is okay to: ? Return to work or school. ? Resume usual  physical activities or sports. ? Resume sexual activity.  Do not drive home if you are discharged the same day as the procedure. Have someone else drive you.  You may drive 24 hours after the procedure unless otherwise instructed by your health care provider.  Do not operate machinery or power tools for 24 hours after the procedure.  If your procedure was done as an outpatient procedure, which means that you went home the same day as your procedure, a responsible adult should be with you for the first 24 hours after you arrive home.  Keep all follow-up visits as directed by your health care provider. This is important. Contact a health care provider if:  You have a fever.  You have chills.  You have increased bleeding from the radial site. Hold pressure on the site. Get help right away if:  You have unusual pain at the radial site.  You have redness, warmth, or swelling at the radial site.  You have drainage (other than a small amount of blood on the dressing) from the radial site.  The radial site is bleeding, and the bleeding does not stop after 30 minutes of holding steady pressure on the site.  Your arm or hand becomes pale, cool, tingly, or numb. This information is not intended to replace advice given to you by your health care provider. Make sure you discuss any questions you have with your health care provider. Document Released: 10/12/2010 Document Revised: 02/15/2016 Document Reviewed: 03/28/2014 Elsevier Interactive Patient Education  2018 [employer].

## 2017-11-12 NOTE — Discharge Summary (Signed)
Discharge Summary/SAME DAY PCI    Patient ID: Gregory Mathews.,  MRN: [id number], DOB/AGE: 1946/09/22 72 y.o.  Admit date: 11/12/2017 Discharge date: 11/13/2017  Primary Care Provider: Wardell Honour Primary Cardiologist: Dr. Ellyn Hack  Discharge Diagnoses    Principal Problem:   Coronary artery disease involving coronary bypass graft of native heart with angina pectoris [facility]) Active Problems:   Abnormal findings on diagnostic imaging of cardiovascular system   Allergies Allergies  Allergen Reactions  . Lisinopril Cough  . Statins Other (See Comments)    Severe leg pain & fatigue -- tried at least 4 (even low dose)    Diagnostic Studies/Procedures    Cath: 11/12/17  Conclusion     Prox LAD lesion is 70% stenosed. FFR 0.76: CULPRIT LESION  A drug-eluting stent was successfully placed using a STENT SIERRA 2.75 X 18 MM.  Post intervention, there is a 0% residual stenosis.  Mid LAD lesion is 40% stenosed well beyond the stent.  Ost Ramus lesion is 50% stenosed.  The left ventricular systolic function is normal. The left ventricular ejection fraction is 55-65% by visual estimate.  LV end diastolic pressure is mildly elevated.   As an expected from coronary CTA, FFR positive proximal LAD lesion (although it did not appear angiographically significant, was FFR positive) treated with a DES stent Normal LVEF with mildly elevated EDP.  Plan: Same day discharge after bed rest.  DAPT for minimum 6 months, can stop aspirin at 6 months.  We will try to start rosuvastatin at 1 tab every other day.  We will need to reassess in follow-up.  Discharge on low-dose beta-blocker   He will follow-up with me as scheduled.  Glenetta Hew, M.D., M.S. Interventional Cardiologist    _____________   History of Present Illness     72 y.o. male who has a pretty strong family history of CAD with father having had bypass surgery and stroke as well as both a sister and  brother in their 37s and 29s having coronary disease.  He himself has never been diagnosed with coronary artery, but has high blood pressure and diabetes mellitus along with hyperlipidemia. He reported being very active exercise about 20-25 minutes to an hour almost every day.   Exercise Tolerance Test December 2016: Exercised for 9:01 min --> blunted blood pressure response no EKG changes.  No chest pain.  Low Risk  2D echo September 18, 2017: EF 55-60%.  Mild aortic stenosis (mean gradient 11 mmH.)  GR 1 DD.  Mild LA dilation.  Coronary CT Angiogram: Coronary calcium score 111. Coronary calcification noted in left main and proximal LAD.  Left main has less than 50% calcified stenosis.  Mid LAD 50-75% plaque noted CT FFR --> positive at 0.80.  Recommend cardiac catheterization.  He was last seen in the office on 11/04/16 and was actually feeling relatively well He noted that his occasional exertional dyspnea remained stable if not improved over the last few weeks prior to office visit. He had not had any further episodes of exertional chest discomfort - stating mostly that he really has not been very active since the last spell. Given his abnormal CTA with FFR he was scheduled to undergo outpatient cath.  Hospital Course     Underwent cardiac cath noted above with successful PCI/DESx1 to mLAD with FFR 0.76. EF normal on LV gram with mildly elevated LVEDP. Plan for DAPT with ASA/plavix for at least 6 months. He was restarted on low dose crestor  41m daily as he has been statin intolerant in the past. Also added low dose Toprol XL. See by cardiac rehab while in short stay. No complications noted post cath. Radial cath site stable. Instructions/precautions given prior to discharge.   Gregory Mathews was seen by Dr. HEllyn Hackand determined stable for discharge home. Follow up in the office has been arranged. Medications are listed below.   _____________  Discharge Vitals Blood pressure 118/61,  pulse 75, temperature 98.6 F (37 C), temperature source Oral, resp. rate 19, height '5\' 10"'  (1.778 m), weight 215 lb (97.5 kg), SpO2 96 %.  Filed Weights   11/12/17 0903  Weight: 215 lb (97.5 kg)    Labs & Radiologic Studies    CBC No results for input(s): WBC, NEUTROABS, HGB, HCT, MCV, PLT in the last 72 hours. Basic Metabolic Panel No results for input(s): NA, K, CL, CO2, GLUCOSE, BUN, CREATININE, CALCIUM, MG, PHOS in the last 72 hours. Liver Function Tests No results for input(s): AST, ALT, ALKPHOS, BILITOT, PROT, ALBUMIN in the last 72 hours. No results for input(s): LIPASE, AMYLASE in the last 72 hours. Cardiac Enzymes No results for input(s): CKTOTAL, CKMB, CKMBINDEX, TROPONINI in the last 72 hours. BNP Invalid input(s): POCBNP D-Dimer No results for input(s): DDIMER in the last 72 hours. Hemoglobin A1C No results for input(s): HGBA1C in the last 72 hours. Fasting Lipid Panel No results for input(s): CHOL, HDL, LDLCALC, TRIG, CHOLHDL, LDLDIRECT in the last 72 hours. Thyroid Function Tests No results for input(s): TSH, T4TOTAL, T3FREE, THYROIDAB in the last 72 hours.  Invalid input(s): FREET3 _____________  Ct Coronary Morph W/cta Cor W/score W/ca W/cm &/or Wo/cm  Addendum Date: 10/23/2017   ADDENDUM REPORT: 10/23/2017 12:02 CLINICAL DATA:  Chest pain EXAM: Cardiac CTA MEDICATIONS: Sub lingual nitro. 47mand lopressor 64m62mECHNIQUE: The patient was scanned on a [id number] scanner. Gantry rotation speed was 270 msecs. Collimation was .9mm18m 100 kV prospective scan was triggered in the descending thoracic aorta at 111 HU's with 5% padding centered around 78% of the R-R interval. Average HR during the scan was 54 bpm. The 3D data set was interpreted on a dedicated work station using MPR, MIP and VRT modes. A total of 80cc of contrast was used. FINDINGS: Non-cardiac: [facility]. No significant findings on limited lung and soft tissue  windows. Calcium Score: Calcium noted in LM and proximal LAD Coronary Arteries: Right dominant with no anomalies LM: Less than 50% calcified stenosis LAD: 50-75% soft plaque in mid LAD IM: Normal D1: Normal D2: Normal Circumflex: Normal OM1: Normal OM2: Normal RCA: Normal and dominant PDA: Normal PLA: Normal IMPRESSION: 1.  Normal aortic root 3.6 cm 2.  Calcified AV and aortic atherosclerosis 3. CAD wit less than 50% calcific LM and 50-75% soft plaque in mid LAD study will be sent for FFR-CT PeteJenkins Rougectronically Signed   By: PeteJenkins Rouge.   On: 10/23/2017 12:02   Result Date: 10/23/2017 EXAM: OVER-READ INTERPRETATION  CT CHEST The following report is an over-read performed by radiologist Dr. DaniVinnie LangtonG[facility], [facility] oKilldeer1/31/2019. This over-read does not include interpretation of cardiac or coronary anatomy or pathology. The coronary calcium score/coronary CTA interpretation by the cardiologist is attached. COMPARISON:  Chest CT 09/18/2017. FINDINGS: Aortic atherosclerosis. Within the visualized portions of the thorax there are no suspicious appearing pulmonary nodules or masses, there is no acute consolidative airspace disease, no pleural effusions, no pneumothorax and  no lymphadenopathy. Visualized portions of the upper abdomen are unremarkable. There are no aggressive appearing lytic or blastic lesions noted in the visualized portions of the skeleton. IMPRESSION: 1.  Aortic Atherosclerosis (ICD10-I70.0). Electronically Signed: By: Vinnie Langton M.D. On: 10/23/2017 09:36   Ct Coronary Fractional Flow Reserve Data Prep  Result Date: 10/30/2017 CLINICAL DATA:  FFR- CT EXAM: FFR-CT TECHNIQUE: Heart Flow Analysis FINDINGS: FFR-CT normal in RCA and circumflex.  Abnormal in mid LAD at .22 Patient will be referred for heart catheterization given morphologic lesions in proximal and mid LAD Suggesting > 70% stenosis IMPRESSION: Positive FFR CT in mid to distal LAD corresponding to area  of soft plaque in mid LAD on CTA Jenkins Rouge Electronically Signed   By: Jenkins Rouge M.D.   On: 10/27/2017 12:25   Ct Coronary Fractional Flow Reserve Fluid Analysis  Result Date: 10/30/2017 CLINICAL DATA:  FFR- CT EXAM: FFR-CT TECHNIQUE: Heart Flow Analysis FINDINGS: FFR-CT normal in RCA and circumflex.  Abnormal in mid LAD at .81 Patient will be referred for heart catheterization given morphologic lesions in proximal and mid LAD Suggesting > 70% stenosis IMPRESSION: Positive FFR CT in mid to distal LAD corresponding to area of soft plaque in mid LAD on CTA Jenkins Rouge Electronically Signed   By: Jenkins Rouge M.D.   On: 10/27/2017 12:25   Disposition   Pt is being discharged home today in good condition.  Follow-up Plans & Appointments    Follow-up Information    Leonie Man, MD Follow up on 11/20/2017.   Specialty:  Cardiology Why:  at 4:20pm for your follow up appt.  Contact information: [address] [phone]          Discharge Instructions    AMB Referral to Cardiac Rehabilitation - Phase II   Complete by:  As directed    Diagnosis:  Coronary Stents   Amb Referral to Cardiac Rehabilitation   Complete by:  As directed    Diagnosis:  Coronary Stents      Discharge Medications     Medication List    TAKE these medications   aspirin 81 MG tablet Take 81 mg by mouth every other day.   blood glucose meter kit and supplies Use to test blood sugar daily. Dx: E11.9   clopidogrel 75 MG tablet Commonly known as:  PLAVIX Take 1 tablet (75 mg total) by mouth daily.   glipiZIDE 5 MG tablet Commonly known as:  GLUCOTROL TAKE 1 TABLET BY MOUTH 2 TIMES DAILY BEFORE A MEAL.   Lancets Misc Use to test blood sugar daily. Dx code :E11.9   losartan 25 MG tablet Commonly known as:  COZAAR Take 1 tablet (25 mg total) by mouth daily.   metFORMIN 500 MG tablet Commonly known as:  GLUCOPHAGE TAKE 1 TABLET (500 MG TOTAL) BY  MOUTH 2 (TWO) TIMES DAILY WITH A MEAL.   metoprolol succinate 25 MG 24 hr tablet Commonly known as:  TOPROL XL Take 1 tablet (25 mg total) by mouth daily.   multivitamin tablet Take 1 tablet by mouth daily.   nitroGLYCERIN 0.4 MG SL tablet Commonly known as:  NITROSTAT Place 1 tablet (0.4 mg total) under the tongue every 5 (five) minutes as needed.   ONE TOUCH ULTRA TEST test strip Generic drug:  glucose blood TEST BLOOD SUGAR DAILY   rosuvastatin 10 MG tablet Commonly known as:  CRESTOR Take 1 tablet (10 mg total) by mouth daily.  Aspirin prescribed at discharge?  Yes High Intensity Statin Prescribed? (Lipitor 40-28m or Crestor 20-432m: No: Low dose crestor, statin intolerant Beta Blocker Prescribed? Yes For EF <40%, was ACEI/ARB Prescribed? Yes ADP Receptor Inhibitor Prescribed? (i.e. Plavix etc.-Includes Medically Managed Patients): Yes For EF <40%, Aldosterone Inhibitor Prescribed? No: EF ok Was EF assessed during THIS hospitalization? Yes Was Cardiac Rehab II ordered? (Included Medically managed Patients): Yes   Outstanding Labs/Studies   FLP/LFTs in 6 weeks.   Duration of Discharge Encounter   Greater than 30 minutes including physician time.  Signed, LiReino BellisP-C 11/13/2017, 2:28 PM

## 2017-11-12 NOTE — Interval H&P Note (Signed)
History and Physical Interval Note:  11/12/2017 10:39 AM  Gregory Mathews.  has presented today for surgery, with the diagnosis of abnormal CV imaging  The various methods of treatment have been discussed with the patient and family. After consideration of risks, benefits and other options for treatment, the patient has consented to  Procedure(s): LEFT HEART CATH AND CORONARY ANGIOGRAPHY (N/A) with Possible Percutaneous Coronary Intervention as a surgical intervention .  The patient's history has been reviewed, patient examined, no change in status, stable for surgery.  I have reviewed the patient's chart and labs.  Questions were answered to the patient's satisfaction.    Cath Lab Visit (complete for each Cath Lab visit)  Clinical Evaluation Leading to the Procedure:   ACS: No.  Non-ACS:    Anginal Classification: CCS II  Anti-ischemic medical therapy: Minimal Therapy (1 class of medications)  Non-Invasive Test Results: High-risk stress test findings: cardiac mortality >3%/year  - Cor CTA FFR + LAD  Prior CABG: No previous CABG      Glenetta Hew

## 2017-11-12 NOTE — Progress Notes (Signed)
Patient ambulated without incident, site unremarkable and patient discharged to home with no complaints.

## 2017-11-12 NOTE — Brief Op Note (Signed)
   BRIEF CARDIAC CATHETERIZATION/PCI NOTE  11/12/2017  12:06 PM  PATIENT:  Gregory Mathews.  72 y.o. male evaluated for atypical anginal symptoms with a CT a-FFR of the coronary arteries showing an LAD lesion that was positive for FFR.  He is now referred for invasive evaluation with cardiac catheterization.  PRE-OPERATIVE DIAGNOSIS:  abnormal imaging -coronary CTA positive FFR of LAD  POST-OPERATIVE DIAGNOSIS: FFR positive/physiologically significant proximal LAD lesion treated with DES stent -Xience Sierra DES 2.75 mm x 18 mm (3.1 mm) otherwise only mild disease in a small caliber ramus intermedius -   Left main trifurcates into LAD with a sharp angle takeoff, small caliber ramus medius and large caliber circumflex.  LAD is a large-caliber vessel with a smooth 65-70% tapering is prior to the first diagonal branch.  (This was evaluated with FFR was found to be 0.76) -treated with DES PCI as noted above->   Ramus intermedius is a small to moderate caliber vessel percent proximal lesion just at its first branch.  Otherwise relatively normal  Circumflex gives off a very high small caliber first marginal branch which courses in the same protrusion as a ramus.  The circumflex then terminates as a lateral OM branch with minimal disease.  Dominant RCA gives rise to a very large RV marginal branch which actually feeds the infero-apex.  The RCA then terminates in a moderate caliber, short RPDA with a very small RPAV-RPL branch.  Minimal disease.  Normal LVEF with normal EDP.  PROCEDURE:  Procedure(s):  LEFT HEART CATH AND CORONARY ANGIOGRAPHY (N/A)   6 French Right Radial Access -Seldinger technique  RCA angiography with TIG 4.0 catheter; LCA angiography not accurate with TIG 4.0, therefore exchanged for 5 [place] EBU 3.5 Guide Catheter  FFR of proximal LAD -Prowater wire, Micronavvus FFR catheter; adenosine infused for 90 seconds at 140 mcg/KG/min  CORONARY STENT INTERVENTION -predilation  with 2.5 mm x 15 mm balloon, Xience cera DES 2.7 mm x 18 mm -> post dilation balloon 3.0 mm x 8 mm -(final stent diameter 3.1 mm)   SURGEON:  Surgeon(s) and Role:    * Leonie Man, MD - Primary  ANESTHESIA:   local and IV sedation; 3 mL subcu lidocaine, 2 mg Versed, 50 mcg fentanyl (75 minutes sedation time)  EBL:  <20 mL  MEDICATIONS USED: Contrast: 170 mL, heparin total of 15,000 units, Plavix 600 mg, Pepcid 20 mg IV  SPECIMEN: Blood samples for ACT  COUNTS:  YES  TR band: 12 mL at 1200-hr  DICTATION: .Note written in EPIC  PLAN OF CARE: Discharge to home after PACU-SHORT STAY -Same Day discharge  Hold metformin for 48 hours  DAPT for minimum 6 months, can stop aspirin after  Beta-blocker being held because of bradycardia and fatigue, can reassess as outpatient  Plan was to start statin  PATIENT DISPOSITION:  PACU - hemodynamically stable.    Glenetta Hew, M.D., M.S. Interventional Cardiologist   Pager # [phone] Phone # [phone] [address] [place], [place] [address]

## 2017-11-12 NOTE — Progress Notes (Signed)
[id number] Education completed with pt and family who voiced understanding. Stressed importance of plavix with stent. Reviewed NTG use, ex ed, carb counting and heart healthy food choices and CRP 2. Will refer to [facility] program. Graylon Good RN BSN 11/12/2017 2:22 PM

## 2017-11-13 ENCOUNTER — Encounter (HOSPITAL_COMMUNITY): Payer: Self-pay | Admitting: Cardiology

## 2017-11-13 ENCOUNTER — Telehealth (HOSPITAL_COMMUNITY): Payer: Self-pay

## 2017-11-13 MED FILL — Heparin Sodium (Porcine) 2 Unit/ML in Sodium Chloride 0.9%: INTRAMUSCULAR | Qty: 1000 | Status: AC

## 2017-11-13 NOTE — Telephone Encounter (Signed)
Patients insurance is active and benefits verified through medicare A/B - No co-pay, deductible amount of $185.00/$133.57 has been met, no out of pocket, 20% co-insurance, and no pre-authorization is required. Passport/reference [id number]  Patients insurance is active and benefits verified through [facility] - No co-pay, deductible amount of $1,080/$0.00 has been met, out of pocket amount of $4,388/$0.00 has been met, 30% co-insurance, and no pre-authorization is required. Spoke with [employer] - Reference [id number]  Patient will be contacted and scheduled upon review by the RN Navigator.

## 2017-11-15 ENCOUNTER — Other Ambulatory Visit: Payer: Self-pay

## 2017-11-15 ENCOUNTER — Ambulatory Visit (HOSPITAL_COMMUNITY)
Admission: EM | Admit: 2017-11-15 | Discharge: 2017-11-15 | Disposition: A | Discharge status: Home / Self Care | Payer: Medicare Other

## 2017-11-15 ENCOUNTER — Telehealth: Payer: Self-pay | Admitting: Physician Assistant

## 2017-11-15 ENCOUNTER — Encounter (HOSPITAL_COMMUNITY): Payer: Self-pay

## 2017-11-15 DIAGNOSIS — M7989 Other specified soft tissue disorders: Secondary | ICD-10-CM

## 2017-11-15 NOTE — ED Provider Notes (Signed)
[facility]    CSN: [id number] Arrival date & time: 11/15/17  1919     History   Chief Complaint Chief Complaint  Patient presents with  . Joint Swelling    HPI Remer Anand Tejada. is a 72 y.o. male history of DM, hyperlipidemia, hypertension, coronary artery disease presenting today with right hand swelling.  Patient had cardiac cath 3 days ago, with stent placement.  Access via right radial artery.  Has been on aspirin 81 mg and Plavix since the procedure.  Swelling has been present since the procedure.  Swelling increased earlier today that was limiting movement, but has since decreased.  Patient does not have any pain, able to move fingers without issue, no lack of sensation.  They called cardiology earlier today, but were unable to understand the PA that they talk to.  He advised to take Tylenol for any pain, continue to monitor.  And to call if worsening.  Patient and wife concerned about clot.  Patient denies history of smoking, recent travel or immobilization, denies previous clots or DVT.  HPI  Past Medical History:  Diagnosis Date  . Allergy   . Basal cell carcinoma 01/2013   Nasal and R forearm.  [facility]  . CAD (coronary artery disease)    2/19 PCI/DESx1 to mLAD, FFR 0.7  . Cataract   . Diabetes mellitus without complication (HCC)    on PO Meds  . Essential hypertension   . Hyperlipidemia    statin intolerant -- myalgias, fatigue (tried at least 4)  . Rosacea     Patient Active Problem List   Diagnosis Date Noted  . Coronary artery disease involving native coronary artery of native heart with angina pectoris ([place]) 11/04/2017  . Abnormal findings on diagnostic imaging of cardiovascular system 11/04/2017  . Pre-op testing 09/22/2017  . Agatston coronary artery calcium score between 100 and 199 09/22/2017  . Family history of early CAD 09/11/2017  . Mild aortic stenosis by prior echocardiogram 09/11/2017  . Metabolic syndrome 63/09/6008    . 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 BMI 31.0-31.9,adult 03/13/2016  . Atypical chest pain 08/29/2015  . Essential hypertension 08/29/2015  . Diverticulosis of colon without hemorrhage 08/01/2015  . Statin intolerance 08/01/2015  . Basal cell carcinoma of nose 08/23/2014  . Rosacea 08/23/2014  . Seasonal allergies 01/26/2013  . Diabetes mellitus type 2, uncomplicated ([place]) 93/23/5573  . Hyperlipidemia associated with type 2 diabetes mellitus ([place]) 07/09/2012  . Erectile dysfunction 07/09/2012    Past Surgical History:  Procedure Laterality Date  . basal cell removal     nose and wrist   . BELPHAROPTOSIS REPAIR    . CORONARY CT ANGIOGRAM  09/2017   For a calcium score was 111. Coronary calcification noted in the LEFT MAIN (LM) and prox LAD.  LM < 50% calcified stenosis.  Mid LAD 50-75% plaque noted CT FFR --> positive at 0.80.  Recommend CARDIAC CATHETERIZATION  . CORONARY STENT INTERVENTION N/A 11/12/2017   Procedure: CORONARY STENT INTERVENTION;  Surgeon: Leonie Man, MD;  Location: [place] [facility];  Service: Cardiovascular;  Laterality: N/A;  . ETT/GXT: Graded Exercise Tolerance Test  08/2015    Exercised for 9:01 min --> blunted blood pressure response no EKG changes.  No chest pain.  Low Risk  . EYE SURGERY     Cataract surgery  . eyelid surgery    . INTRAVASCULAR PRESSURE WIRE/FFR STUDY N/A 11/12/2017   Procedure: INTRAVASCULAR PRESSURE WIRE/FFR STUDY;  Surgeon: Glenetta Hew  W, MD;  Location: [facility];  Service: Cardiovascular;  Laterality: N/A;  . LEFT HEART CATH AND CORONARY ANGIOGRAPHY N/A 11/12/2017   Procedure: LEFT HEART CATH AND CORONARY ANGIOGRAPHY;  Surgeon: Leonie Man, MD;  Location: [facility];  Service: Cardiovascular;  Laterality: N/A;  . SHOULDER OPEN ROTATOR CUFF REPAIR  1983  . TRANSTHORACIC ECHOCARDIOGRAM  08/2017   EF 55-60%.  Mild aortic stenosis (mean gradient 11 mmH.)  GR 1 DD.  Mild LA dilation.       Home  Medications    Prior to Admission medications   Medication Sig Start Date End Date Taking? Authorizing Provider  aspirin 81 MG tablet Take 81 mg by mouth every other day.    Yes [provider]  blood glucose meter kit and supplies Use to test blood sugar daily. Dx: E11.9 10/11/15  Yes Tereasa Coop, PA-C  clopidogrel (PLAVIX) 75 MG tablet Take 1 tablet (75 mg total) by mouth daily. 11/12/17  Yes Reino Bellis B, NP  glipiZIDE (GLUCOTROL) 5 MG tablet TAKE 1 TABLET BY MOUTH 2 TIMES DAILY BEFORE A MEAL. 11/03/17  Yes Wardell Honour, MD  losartan (COZAAR) 25 MG tablet Take 1 tablet (25 mg total) by mouth daily. 05/07/17  Yes Wardell Honour, MD  metFORMIN (GLUCOPHAGE) 500 MG tablet TAKE 1 TABLET (500 MG TOTAL) BY MOUTH 2 (TWO) TIMES DAILY WITH A MEAL. 11/03/17  Yes Wardell Honour, MD  metoprolol succinate (TOPROL XL) 25 MG 24 hr tablet Take 1 tablet (25 mg total) by mouth daily. 11/12/17 02/10/18 Yes Cheryln Manly, NP  Multiple Vitamin (MULTIVITAMIN) tablet Take 1 tablet by mouth daily.   Yes [provider]  nitroGLYCERIN (NITROSTAT) 0.4 MG SL tablet Place 1 tablet (0.4 mg total) under the tongue every 5 (five) minutes as needed. 11/12/17  Yes Cheryln Manly, NP  ONE TOUCH ULTRA TEST test strip TEST BLOOD SUGAR DAILY 06/24/17  Yes Tereasa Coop, PA-C  rosuvastatin (CRESTOR) 10 MG tablet Take 1 tablet (10 mg total) by mouth daily. 11/04/17  Yes Leonie Man, MD  Lancets MISC Use to test blood sugar daily. Dx code :E11.9 10/11/15   Tereasa Coop, PA-C    Family History Family History  Problem Relation Age of Onset  . Leukemia Mother   . Stroke Father 14       as a complication of CABG  . Colon cancer Father 84  . Cancer Father 67       Colon cancer  . Coronary artery disease Father 76       - Sx was DOE - MV CAD  . Peripheral Artery Disease Father        presumptive  . Diabetes Sister   . Sudden Cardiac Death Sister 11       presumed MI  . Heart attack  Brother 81       During throat surgery  . Congestive Heart Failure Brother        Died @ 7 - ? CHF / ICM  . Cancer Maternal Grandmother   . Stroke Maternal Grandfather   . Diabetes Brother 64       on lots of meds  . Diabetes Brother   . Cancer Brother        colon cancer  . Rheumatic fever Brother        childhood RF --> Valvular Dz - valve Sgx,  died young    Social History Social  History   Tobacco Use  . Smoking status: Former Smoker    Packs/day: 1.00    Years: 15.00    Pack years: 15.00    Last attempt to quit: 07/09/1982    Years since quitting: 35.3  . Smokeless tobacco: Never Used  Substance Use Topics  . Alcohol use: No  . Drug use: No     Allergies   Lisinopril and Statins   Review of Systems Review of Systems  Constitutional: Negative for fatigue.  Respiratory: Negative for cough and shortness of breath.   Cardiovascular: Negative for chest pain.  Musculoskeletal: Positive for joint swelling. Negative for arthralgias.  Skin: Negative for color change.  Neurological: Negative for dizziness, syncope, weakness, light-headedness, numbness and headaches.     Physical Exam Triage Vital Signs ED Triage Vitals  Enc Vitals Group     BP 11/15/17 2045 (!) 150/70     Pulse Rate 11/15/17 2045 (!) 55     Resp 11/15/17 2045 16     Temp 11/15/17 2045 97.8 F (36.6 C)     Temp Source 11/15/17 2045 Oral     SpO2 11/15/17 2045 100 %     Weight --      Height --      Head Circumference --      Peak Flow --      Pain Score 11/15/17 2047 0     Pain Loc --      Pain Edu? --      Excl. in [place]? --    No data found.  Updated Vital Signs BP (!) 150/70 (BP Location: Left Arm)   Pulse (!) 55   Temp 97.8 F (36.6 C) (Oral)   Resp 16   SpO2 100%   Visual Acuity Right Eye Distance:   Left Eye Distance:   Bilateral Distance:    Right Eye Near:   Left Eye Near:    Bilateral Near:     Physical Exam  Constitutional: He appears well-developed and  well-nourished.  HENT:  Head: Normocephalic and atraumatic.  Eyes: Conjunctivae are normal.  Neck: Neck supple.  Cardiovascular: Normal rate and regular rhythm.  No murmur heard. Pulmonary/Chest: Effort normal and breath sounds normal. No respiratory distress.  Musculoskeletal: He exhibits no edema.  Mild swelling to right hand, skin appears slightly tighter compared to left.  Warmth the fingers equal to the left.  Patient has well-healing site of insertion from cath.  Radial pulse 2+ bilaterally.  Swelling does not extend more proximal than the wrists.  No increased redness, no pain with palpation.  Full range of motion at DIP, PIP and MCPs.  Neurological: He is alert.  Skin: Skin is warm and dry.  Psychiatric: He has a normal mood and affect.  Nursing note and vitals reviewed.    UC Treatments / Results  Labs (all labs ordered are listed, but only abnormal results are displayed) Labs Reviewed - No data to display  EKG  EKG Interpretation None       Radiology No results found.  Procedures Procedures (including critical care time)  Medications Ordered in UC Medications - No data to display   Initial Impression / Assessment and Plan / UC Course  I have reviewed the triage vital signs and the nursing notes.  Pertinent labs & imaging results that were available during my care of the patient were reviewed by me and considered in my medical decision making (see chart for details).     Patient with swelling  to hand after radial artery cardiac catheterization.  Swelling appears minimal, no change in warmth, lacking pain.  Swelling appears more likely related to recent cath versus clot.  We will continue to monitor.  Call cardiology on Monday if swelling persisting. Discussed strict return precautions. Patient verbalized understanding and is agreeable with plan.   Discussed with Dr. Nani Ravens  Final Clinical Impressions(s) / UC Diagnoses   Final diagnoses:  Swelling of  right hand    ED Discharge Orders    None       Controlled Substance Prescriptions [place] Controlled Substance Registry consulted? Not Applicable   Janith Lima, [place] 11/15/17 2138

## 2017-11-15 NOTE — Discharge Instructions (Signed)
Please continue to montior. If you develop pain, increased warmth, lack of sensation, please go to emergency room. Please call cardiology on Monday if symptoms not improving.   Please keep elevated, may take tylenol or ibuprofen.

## 2017-11-15 NOTE — Telephone Encounter (Signed)
Paged by answering service, patient has noted right swelling.  Patient had a right radial cath few days ago.  Swelling started yesterday.  Stable.  No bluish discoloration.  Denies any pain or tingling sensation.  Advised to keep monitoring.  If worsening he will give [place] a call.  Advised to take Tylenol if any pain.

## 2017-11-15 NOTE — ED Triage Notes (Signed)
Pt presents today with right hand swelling that has been going x3 days. States there is no pain associated with the swelling and it started after a catheterization he had done on Wednesday.

## 2017-11-19 ENCOUNTER — Telehealth (HOSPITAL_COMMUNITY): Payer: Self-pay

## 2017-11-19 NOTE — Telephone Encounter (Signed)
Attempted to call patient in regards to Cardiac Rehab - lm on vm °

## 2017-11-20 ENCOUNTER — Ambulatory Visit (INDEPENDENT_AMBULATORY_CARE_PROVIDER_SITE_OTHER): Payer: Medicare Other | Admitting: Cardiology

## 2017-11-20 ENCOUNTER — Encounter: Payer: Self-pay | Admitting: Cardiology

## 2017-11-20 ENCOUNTER — Telehealth (HOSPITAL_COMMUNITY): Payer: Self-pay

## 2017-11-20 VITALS — BP 135/69 | HR 73 | Ht 70.0 in | Wt 216.0 lb

## 2017-11-20 DIAGNOSIS — I350 Nonrheumatic aortic (valve) stenosis: Secondary | ICD-10-CM | POA: Diagnosis not present

## 2017-11-20 DIAGNOSIS — I10 Essential (primary) hypertension: Secondary | ICD-10-CM

## 2017-11-20 DIAGNOSIS — R0789 Other chest pain: Secondary | ICD-10-CM | POA: Diagnosis not present

## 2017-11-20 DIAGNOSIS — Z8249 Family history of ischemic heart disease and other diseases of the circulatory system: Secondary | ICD-10-CM | POA: Diagnosis not present

## 2017-11-20 DIAGNOSIS — I25119 Atherosclerotic heart disease of native coronary artery with unspecified angina pectoris: Secondary | ICD-10-CM

## 2017-11-20 DIAGNOSIS — E1169 Type 2 diabetes mellitus with other specified complication: Secondary | ICD-10-CM | POA: Diagnosis not present

## 2017-11-20 DIAGNOSIS — E785 Hyperlipidemia, unspecified: Secondary | ICD-10-CM

## 2017-11-20 MED ORDER — PRAVASTATIN SODIUM 20 MG PO TABS: 20.0000 mg | ORAL_TABLET | Freq: Every evening | ORAL | 6 refills | Status: DC

## 2017-11-20 NOTE — Progress Notes (Signed)
PCP: Wardell Honour, MD  Clinic Note: Chief Complaint  Patient presents with  . Hospitalization Follow-up    Post cath/PCI  . Coronary Artery Disease    No angina    HPI: Gregory Mathews. is a 72 y.o. male who is being seen for post cath follow-up. Gregory Mathews was initially seen on December 20 in consultation for atypical chest discomfort with strong family history of CAD at the request of Wardell Honour, MD.  Gregory Mathews is a very pleasant gentleman who has a pretty strong family history of CAD with his father as well as sister and brother all having CAD.  His father had CABG as well as a stroke.  And brother and sister both had CAD noted in the 70s and 63s.  At baseline he is very active, indicated that he likes to exercise about 20-73mnutes to an hour almost every day.   Gregory Mathews was last seen on February 12 in follow-up after his initial evaluation was with an Echocardiogram and Coronary CT Angiogram that revealed mild aortic stenosis as well as significant lesion in the proximal LAD there was FFR positive.  Cardiac catheterization was recommended.  Recent Hospitalizations: None  Studies Personally Reviewed - (if available, images/films reviewed: From Epic Chart or Care Everywhere) LEFT HEART CATH AND CORONARY ANGIOGRAPHY  INTRAVASCULAR PRESSURE WIRE/FFR STUDY - CORONARY STENT INTERVENTION   Prox LAD lesion is 70% stenosed. FFR 0.76: CULPRIT LESION  DES PCI:  STENT SIERRA 2.75 X 18 MM - Post intervention, there is a 0% residual stenosis.  Mid LAD lesion is 40% stenosed well beyond the stent.  Ost Ramus lesion is 50% stenosed.  The left ventricular systolic function is normal. EF 55-65% by visual estimate.  LV end diastolic pressure is mildly elevated.    Interval History: Mr. PHinkreturns today actually doing fairly well.  He has not had any recurrent anginal symptoms with rest or exertion.  He is back exercising.  He is only been limited limited by  some aching in his arm from the heart catheterization.  He is anxious to get back into full activity levels. He denies any exertional dyspnea, but has not yet really got back to full level of exertion.  No chest tightness or pressure with rest or exertion. He denies any heart failure symptoms of PND, orthopnea or edema.  No rapid irregular heartbeats or palpitations. No syncope/near syncope or TIA/amaurosis fugax. He is now on aspirin and Plavix without any major bleeding issues.  No melena, hematochezia, hematuria, epistaxis. No claudication.  Is most notable symptoms that he just simply could not tolerate taking Crestor.  Every time he takes a pill he feels horrible for the next day or so.  Muscle aches cramping and fatigue.  ROS: A comprehensive was performed. Review of Systems  Constitutional: Negative for malaise/fatigue.  HENT: Negative for congestion and nosebleeds.   Respiratory: Negative for cough, shortness of breath and wheezing.   Gastrointestinal: Negative for blood in stool and melena.  Genitourinary: Negative for hematuria.  Musculoskeletal: Negative for falls.  Neurological: Negative for dizziness.  Psychiatric/Behavioral: Negative for depression and memory loss. The patient is not nervous/anxious and does not have insomnia.   All other systems reviewed and are negative.  I have reviewed and (if needed) personally updated the patient's problem list, medications, allergies, past medical and surgical history, social and family history.   Past Medical History:  Diagnosis Date  . Allergy   . Basal cell carcinoma  01/2013   Nasal and R forearm.  [facility]  . CAD S/P percutaneous coronary angioplasty    2/19 PCI/DESx1 to mLAD, FFR 0.7 -Sierra DES 2.75 mm x 18 mm  . Cataract   . Diabetes mellitus without complication (HCC)    on PO Meds  . Essential hypertension   . Hyperlipidemia    statin intolerant -- myalgias, fatigue (tried at least 4)  . Rosacea      Past Surgical History:  Procedure Laterality Date  . basal cell removal     nose and wrist   . BELPHAROPTOSIS REPAIR    . CORONARY CT ANGIOGRAM  09/2017   Coronary Calcium Score: 111. Coronary calcification noted in the LEFT MAIN (LM) and prox LAD.  LM < 50% calcified stenosis.  Mid LAD 50-75% plaque noted CT FFR --> positive at 0.80.  Recommend CARDIAC CATHETERIZATION  . CORONARY STENT INTERVENTION N/A 11/12/2017   Procedure: CORONARY STENT INTERVENTION;  Surgeon: Leonie Man, MD;  Location: [place] [facility];  Service: Cardiovascular:  DES PCI:  STENT SIERRA 2.75 X 18 MM - Post intervention, there is a 0% residual stenosis.  Marland Kitchen ETT/GXT: Graded Exercise Tolerance Test  08/2015    Exercised for 9:01 min --> blunted blood pressure response no EKG changes.  No chest pain.  Low Risk  . EYE SURGERY     Cataract surgery  . eyelid surgery    . INTRAVASCULAR PRESSURE WIRE/FFR STUDY N/A 11/12/2017   Procedure: INTRAVASCULAR PRESSURE WIRE/FFR STUDY;  Surgeon: Leonie Man, MD;  Location: [place] [facility];  Service: Cardiovascular;  Laterality: LAD -FFR 0.76  . LEFT HEART CATH AND CORONARY ANGIOGRAPHY N/A 11/12/2017   Procedure: LEFT HEART CATH AND CORONARY ANGIOGRAPHY;  Surgeon: Leonie Man, MD;  [facility]: [facility]: 70% P-M LAD (FFR 0.76).  40% mid LAD, 50% ostial ramus.  EF 55-60%.  Mildly elevated LVEDP.  Marland Kitchen SHOULDER OPEN ROTATOR CUFF REPAIR  1983  . TRANSTHORACIC ECHOCARDIOGRAM  08/2017   EF 55-60%.  Mild aortic stenosis (mean gradient 11 mmH.)  GR 1 DD.  Mild LA dilation.    Current Meds  Medication Sig  . aspirin 81 MG tablet Take 81 mg by mouth every other day.   . blood glucose meter kit and supplies Use to test blood sugar daily. Dx: E11.9  . clopidogrel (PLAVIX) 75 MG tablet Take 1 tablet (75 mg total) by mouth daily.  Marland Kitchen glipiZIDE (GLUCOTROL) 5 MG tablet Take by mouth daily before breakfast.  . Lancets MISC Use to test blood sugar daily. Dx code :E11.9  .  losartan (COZAAR) 25 MG tablet Take 1 tablet (25 mg total) by mouth daily.  . metoprolol succinate (TOPROL XL) 25 MG 24 hr tablet Take 1 tablet (25 mg total) by mouth daily.  . Multiple Vitamin (MULTIVITAMIN) tablet Take 1 tablet by mouth daily.  . nitroGLYCERIN (NITROSTAT) 0.4 MG SL tablet Place 1 tablet (0.4 mg total) under the tongue every 5 (five) minutes as needed.  . ONE TOUCH ULTRA TEST test strip TEST BLOOD SUGAR DAILY  . [DISCONTINUED] glipiZIDE (GLUCOTROL) 5 MG tablet TAKE 1 TABLET BY MOUTH 2 TIMES DAILY BEFORE A MEAL. (Patient taking differently: No sig reported)  . [DISCONTINUED] metFORMIN (GLUCOPHAGE) 500 MG tablet TAKE 1 TABLET (500 MG TOTAL) BY MOUTH 2 (TWO) TIMES DAILY WITH A MEAL.  . [DISCONTINUED] rosuvastatin (CRESTOR) 10 MG tablet Take 1 tablet (10 mg total) by mouth daily.    Allergies  Allergen  Reactions  . Lisinopril Cough  . Statins Other (See Comments)    Severe leg pain & fatigue -- tried at least 4 (even low dose)    Social History   Tobacco Use  . Smoking status: Former Smoker    Packs/day: 1.00    Years: 15.00    Pack years: 15.00    Last attempt to quit: 07/09/1982    Years since quitting: 35.3  . Smokeless tobacco: Never Used  Substance Use Topics  . Alcohol use: No  . Drug use: No   Social History   Social History Narrative   Marital status: married x 84 years; happily married.       Children:  4 children; 4 grandchildren. Adult children all live locally.      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      Tobacco:  In past; smoked x 15 years; quit 35 years ago.      Alcohol: none      Exercise:sporadic in 2017.      ADLs: independent with all ADLs.  Does not use assistant device with ambulation.      Living Will:  Has living will; desires FULL CODE; no prolonged measures.      Doctoral degree in Biblical Studies.       Social History family history  includes Cancer in his brother and maternal grandmother; Cancer (age of onset: 35) in his father; Colon cancer (age of onset: 27) in his father; Congestive Heart Failure in his brother; Coronary artery disease (age of onset: 43) in his father; Diabetes in his brother and sister; Diabetes (age of onset: 21) in his brother; Heart attack (age of onset: 32) in his brother; Leukemia in his mother; Peripheral Artery Disease in his father; Rheumatic fever in his brother; Stroke in his maternal grandfather; Stroke (age of onset: 27) in his father; Sudden Cardiac Death (age of onset: 98) in his sister.  Wt Readings from Last 3 Encounters:  11/20/17 216 lb (98 kg)  11/12/17 215 lb (97.5 kg)  11/04/17 216 lb (98 kg)    PHYSICAL EXAM BP 135/69   Pulse 73   Ht '5\' 10"'  (1.778 m)   Wt 216 lb (98 kg)   BMI 30.99 kg/m   Physical Exam  Constitutional: He is oriented to person, place, and time. He appears well-developed and well-nourished. No distress.  Well-groomed.  Healthy-appearing.  Mildly obese  HENT:  Head: Normocephalic and atraumatic.  Mouth/Throat: No oropharyngeal exudate.  Neck: Normal range of motion. Neck supple. No hepatojugular reflux and no JVD present. Carotid bruit is not present.  Cardiovascular: Normal rate, regular rhythm, S1 normal, S2 normal, intact distal pulses and normal pulses.  No extrasystoles are present. PMI is not displaced. Exam reveals no gallop and no friction rub.  Murmur heard.  Medium-pitched harsh early systolic murmur is present with a grade of 1/6 at the upper right sternal border radiating to the neck. Pulmonary/Chest: Effort normal and breath sounds normal. No respiratory distress. He has no wheezes. He has no rales. He exhibits no tenderness.  Abdominal: Soft. Bowel sounds are normal. He exhibits no distension. There is no tenderness. There is no rebound.  Musculoskeletal: Normal range of motion. He exhibits no edema.  Mild bruising on the right arm, little bit  tenderness in the forearm area. + Allen's test.  Neurological: He is alert and oriented to person, place,  and time. No cranial nerve deficit.  Skin: Skin is warm and dry. He is not diaphoretic.  Psychiatric: He has a normal mood and affect. His behavior is normal. Thought content normal.  Nursing note and vitals reviewed.    Adult ECG Report Sinus rhythm 70 bpm.Borderline first-degree AV block.  Otherwise no significant ST and T wave changes.  Normal axis, intervals and durations. Normal EKG  Other studies Reviewed: Additional studies/ records that were reviewed today include:  Recent Labs:    Lab Results  Component Value Date   HGBA1C 7.1 (H) 08/22/2017   Lab Results  Component Value Date   CHOL 168 08/22/2017   HDL 38 (L) 08/22/2017   LDLCALC 106 (H) 08/22/2017   TRIG 118 08/22/2017   CHOLHDL 4.4 08/22/2017   Lab Results  Component Value Date   CREATININE 0.90 11/04/2017   BUN 19 11/04/2017   NA 141 11/04/2017   K 4.1 11/04/2017   CL 101 11/04/2017   CO2 20 11/04/2017    ASSESSMENT / PLAN: Problem List Items Addressed This Visit    Atypical chest pain    Very unusual sounding symptoms as far as angina goes, not sure if the symptoms were truly angina or not.  But he was having some exertional symptoms but he is no longer noting. Continue beta-blocker and ARB along with aspirin Plavix.      Relevant Orders   EKG 12-Lead   Coronary artery disease involving native coronary artery of native heart with angina pectoris ([place]) - Primary (Chronic)    Cardiac catheterization essentially confirmed coronary CTA.  EF FFR in the LAD in the cardiac cath was extremely positive.  Treated with a DES stent.  Plan: Continue Asprint & ARB along with newly added Plavix and metoprolol. Intolerant of rosuvastatin.  Convert to pravastatin.      Relevant Medications   pravastatin (PRAVACHOL) 20 MG tablet   Other Relevant Orders   EKG 12-Lead   Lipid panel   Comprehensive metabolic  panel   Essential hypertension (Chronic)    Stable blood pressure on current dose of Toprol and losartan.      Relevant Medications   pravastatin (PRAVACHOL) 20 MG tablet   Family history of early CAD (Chronic)    He now has joined his family with a history of coronary disease.  He had very unusual symptoms, and mostly was found by screening evaluation.  Continue risk factor modification.      Hyperlipidemia associated with type 2 diabetes mellitus (HCC) (Chronic)    Intolerant of atorvastatin, simvastatin and now rosuvastatin.  Had significant muscle aches and cramping and fatigue associated with it. Plan will be to switch to pravastatin, and if that is unsuccessful in controlling his lipids, would consider converting to PCSK9 inhibitor.  Recheck labs in 4 months prior to follow-up      Relevant Medications   glipiZIDE (GLUCOTROL) 5 MG tablet   pravastatin (PRAVACHOL) 20 MG tablet   Other Relevant Orders   Lipid panel   Comprehensive metabolic panel   Mild aortic stenosis by prior echocardiogram (Chronic)    Follow-up echocardiogram in roughly 2 years      Relevant Medications   pravastatin (PRAVACHOL) 20 MG tablet     Studies Ordered:   Orders Placed This Encounter  Procedures  . Lipid panel  . Comprehensive metabolic panel  . EKG 12-Lead   Patient Instructions  MEDICATION INSTRUCTION PRAVASTATIN 20 MG TAKE EVERY OTHER DAY FOR 2 WEEKS ,THEN INCREASE TO ONCE  A DAY. IF UNABLE TO TOLERATE STAY WITH EVERY OTHER DAY   STOP ROSUVASTATIN  RECHECK LABS IN 4 MONTHS  LIPID  CMP  WILL MAIL YOU LAB SLIP IN 3 MONTHS   Your physician wants you to follow-up in 4 MONTHS WITH DR HARDING.You will receive a reminder letter in the mail two months in advance. If you don't receive a letter, please call our office to schedule the follow-up appointment.  If you need a refill on your cardiac medications before your next appointment, please call your pharmacy.      Glenetta Hew, M.D., M.S. Interventional Cardiologist   Pager # [phone] Phone # [phone] [address] [place], [place] [address]   Thank you for choosing [facility] at [facility]!!

## 2017-11-20 NOTE — Patient Instructions (Addendum)
MEDICATION INSTRUCTION PRAVASTATIN 20 MG TAKE EVERY OTHER DAY FOR 2 WEEKS ,THEN INCREASE TO ONCE A DAY. IF UNABLE TO TOLERATE STAY WITH EVERY OTHER DAY   STOP ROSUVASTATIN  RECHECK LABS IN 4 MONTHS  LIPID  CMP  WILL MAIL YOU LAB SLIP IN 3 MONTHS   Your physician wants you to follow-up in 4 MONTHS WITH DR HARDING.You will receive a reminder letter in the mail two months in advance. If you don't receive a letter, please call our office to schedule the follow-up appointment.  If you need a refill on your cardiac medications before your next appointment, please call your pharmacy.

## 2017-11-20 NOTE — Telephone Encounter (Signed)
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 Patient asked that I give him a call in may.

## 2017-11-22 ENCOUNTER — Encounter: Payer: Self-pay | Admitting: Cardiology

## 2017-11-22 NOTE — Assessment & Plan Note (Addendum)
Intolerant of atorvastatin, simvastatin and now rosuvastatin.  Had significant muscle aches and cramping and fatigue associated with it. Plan will be to switch to pravastatin, and if that is unsuccessful in controlling his lipids, would consider converting to PCSK9 inhibitor.  Recheck labs in 4 months prior to follow-up

## 2017-11-22 NOTE — Assessment & Plan Note (Signed)
Very unusual sounding symptoms as far as angina goes, not sure if the symptoms were truly angina or not.  But he was having some exertional symptoms but he is no longer noting. Continue beta-blocker and ARB along with aspirin Plavix.

## 2017-11-22 NOTE — Assessment & Plan Note (Signed)
He now has joined his family with a history of coronary disease.  He had very unusual symptoms, and mostly was found by screening evaluation.  Continue risk factor modification.

## 2017-11-22 NOTE — Assessment & Plan Note (Signed)
Follow-up echocardiogram in roughly 2 years

## 2017-11-22 NOTE — Assessment & Plan Note (Signed)
Stable blood pressure on current dose of Toprol and losartan.

## 2017-11-22 NOTE — Assessment & Plan Note (Signed)
Cardiac catheterization essentially confirmed coronary CTA.  EF FFR in the LAD in the cardiac cath was extremely positive.  Treated with a DES stent.  Plan: Continue Asprint & ARB along with newly added Plavix and metoprolol. Intolerant of rosuvastatin.  Convert to pravastatin.

## 2017-11-24 ENCOUNTER — Telehealth: Payer: Self-pay | Admitting: Cardiology

## 2017-11-24 NOTE — Telephone Encounter (Signed)
Patient calling, states that he would like to know if Dr. Ellyn Hack could write a letter giving his approval for patient to return to work.

## 2017-11-24 NOTE — Telephone Encounter (Signed)
Returned call to patient, patient requesting DOT clearance letter.   States he has his physical today and will need this letter.   Advised I would send message to Dr. Ellyn Hack and primary to address.   Patient aware and verbalized understanding.

## 2017-11-24 NOTE — Telephone Encounter (Signed)
lmtcb

## 2017-11-24 NOTE — Telephone Encounter (Signed)
I just need to know what he needs PSA for DOT clearance letter. Once I know what it is they are asking me to say, I will be happy to do a letter.  Glenetta Hew, MD

## 2017-11-24 NOTE — Telephone Encounter (Signed)
Follow up ° ° ° °Patient returning call.  Please call °

## 2017-11-25 NOTE — Telephone Encounter (Signed)
SPOKE TO PATIENT . 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 PATIENT STATES HE WILL . PATIENT AWARE WILL DEFER BACK TO DR HARDING.

## 2017-11-27 ENCOUNTER — Telehealth: Payer: Self-pay | Admitting: Urgent Care

## 2017-11-27 ENCOUNTER — Encounter: Payer: Self-pay | Admitting: Urgent Care

## 2017-11-27 ENCOUNTER — Ambulatory Visit (INDEPENDENT_AMBULATORY_CARE_PROVIDER_SITE_OTHER): Payer: Medicare Other | Admitting: Urgent Care

## 2017-11-27 VITALS — BP 154/60 | HR 82 | Temp 97.9°F | Resp 18 | Ht 70.0 in | Wt 215.2 lb

## 2017-11-27 DIAGNOSIS — I10 Essential (primary) hypertension: Secondary | ICD-10-CM

## 2017-11-27 DIAGNOSIS — I2510 Atherosclerotic heart disease of native coronary artery without angina pectoris: Secondary | ICD-10-CM

## 2017-11-27 DIAGNOSIS — Z024 Encounter for examination for driving license: Secondary | ICD-10-CM

## 2017-11-27 DIAGNOSIS — E119 Type 2 diabetes mellitus without complications: Secondary | ICD-10-CM

## 2017-11-27 DIAGNOSIS — Z955 Presence of coronary angioplasty implant and graft: Secondary | ICD-10-CM

## 2017-11-27 DIAGNOSIS — R030 Elevated blood-pressure reading, without diagnosis of hypertension: Secondary | ICD-10-CM

## 2017-11-27 NOTE — Progress Notes (Signed)
Games developer Medical Examination   Gregory Mathews. is a 72 y.o. male who presents today for a DOT physical exam. The patient reports history of CAD, s/p stent placement on 11/12/2017. He has been seen by cardiology since then for follow up, plan is to recheck in 03/2018, repeat echo in ~2 years. He is currently on Plavix. Also has HTN, managed with medical therapy. Denies dizziness, chronic headache, blurred vision, chest pain, shortness of breath, heart racing, palpitations, nausea, vomiting, abdominal pain, hematuria, lower leg swelling. Denies smoking cigarettes or drinking alcohol.   The following portions of the patient's history were reviewed and updated as appropriate: allergies, current medications, past family history, past medical history, past social history and past surgical history.  Objective:   BP (!) 165/98   Pulse 82   Temp 97.9 F (36.6 C) (Oral)   Resp 18   Ht 5\' 10"  (1.778 m)   Wt 215 lb 3.2 oz (97.6 kg)   SpO2 95%   BMI 30.88 kg/m   BP Readings from Last 3 Encounters:  11/27/17 (!) 165/98  11/20/17 135/69  11/15/17 (!) 150/70    Vision/hearing:  Visual Acuity Screening   Right eye Left eye Both eyes  Without correction:     With correction: 20/25-1 20/30-1 20/25  Comments: Peripheral Vision: Right eye 70 degrees. Left eye 85 degrees.  The patient can distinguish the colors red, amber and green.  Hearing Screening Comments: The patient was able to hear a forced whisper from 10 feet.  Patient can recognize and distinguish among traffic control signals and devices showing standard red, green, and amber colors.  Corrective lenses required: Yes  Monocular Vision?: No  Hearing aid requirement: No  Physical Exam  Constitutional: He is oriented to person, place, and time. He appears well-developed and well-nourished.  HENT:  TM's intact bilaterally, no effusions or erythema. Nasal turbinates pink and moist, nasal passages patent. No sinus  tenderness. Oropharynx clear, mucous membranes moist, dentition in good repair.  Eyes: Conjunctivae and EOM are normal. Pupils are equal, round, and reactive to light. Right eye exhibits no discharge. Left eye exhibits no discharge. No scleral icterus.  Neck: Normal range of motion. Neck supple.  Cardiovascular: Normal rate, regular rhythm and intact distal pulses. Exam reveals no gallop and no friction rub.  No murmur heard. Pulmonary/Chest: No stridor. No respiratory distress. He has no wheezes. He has no rales.  Abdominal: Soft. Bowel sounds are normal. He exhibits no distension and no mass. There is no tenderness.  Musculoskeletal: Normal range of motion. He exhibits no edema or tenderness.  Lymphadenopathy:    He has no cervical adenopathy.  Neurological: He is alert and oriented to person, place, and time. He has normal reflexes. He displays normal reflexes. Coordination normal.  Skin: Skin is warm and dry. No rash noted. No erythema. No pallor.  Psychiatric: He has a normal mood and affect.   Labs: Comments: Urine Specimen:  SpGr:  1.010   Blood:  Negative   Glucose:  Negative   Protein:  Negative  Assessment:    Healthy male exam.   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 He also does not have his blood pressure controlled today. Plan is to obtain letter from his cardiologist, rtc next week and recheck BP at that time. Patient was charged for visit today, no charge at next visit since it will be a simple  recheck.  Jaynee Eagles, PA-C [facility] [phone] 11/27/2017  3:28 PM

## 2017-11-27 NOTE — Telephone Encounter (Signed)
LEFT MESSAGE TO PATIENT . DR HARDING IS AWARE AND WILL DO LETTER TONIGHT. CALL TOMORROW @11  AM to see if letter is ready for pick up

## 2017-11-27 NOTE — Telephone Encounter (Signed)
Called and spoke to pt to remind them of their apt today. I advised of the time, building number and time regulations. °

## 2017-11-27 NOTE — Telephone Encounter (Signed)
Follow up   Patient states that it has to say that he is "physically able to return to work with no limitations or restrictions". The letter has to be typed with the Doctors signature and it can not be fixed. 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   Please call.

## 2017-11-27 NOTE — Patient Instructions (Addendum)
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 We will see you back in clinic for a recheck then and hopefully, if your blood pressure is under control we will give you a new DOT card for up to 6 months.   Health Maintenance, Male A healthy lifestyle and preventive care is important for your health and wellness. Ask your health care provider about what schedule of regular examinations is right for you. What should I know about weight and diet? Eat a Healthy Diet  Eat plenty of vegetables, fruits, whole grains, low-fat dairy products, and lean protein.  Do not eat a lot of foods high in solid fats, added sugars, or salt.  Maintain a Healthy Weight Regular exercise can help you achieve or maintain a healthy weight. You should:  Do at least 150 minutes of exercise each week. The exercise should increase your heart rate and make you sweat (moderate-intensity exercise).  Do strength-training exercises at least twice a week.  Watch Your Levels of Cholesterol and Blood Lipids  Have your blood tested for lipids and cholesterol every 5 years starting at 70 years of age. If you are at high risk for heart disease, you should start having your blood tested when you are 70 years old. You may need to have your cholesterol levels checked more often if: ? Your lipid or cholesterol levels are high. ? You are older than 70 years of age. ? You are at high risk for heart disease.  What should I know about cancer screening? Many types of cancers can be detected early and may often be prevented. Lung Cancer  You should be screened every year for lung cancer if: ? You are a current smoker who has smoked for at least 30 years. ? You are a former smoker who has quit within the past 15 years.  Talk to your health care provider about your screening options, when you should start screening, and how often you  should be screened.  Colorectal Cancer  Routine colorectal cancer screening usually begins at 70 years of age and should be repeated every 5-10 years until you are 70 years old. You may need to be screened more often if early forms of precancerous polyps or small growths are found. Your health care provider may recommend screening at an earlier age if you have risk factors for colon cancer.  Your health care provider may recommend using home test kits to check for hidden blood in the stool.  A small camera at the end of a tube can be used to examine your colon (sigmoidoscopy or colonoscopy). This checks for the earliest forms of colorectal cancer.  Prostate and Testicular Cancer  Depending on your age and overall health, your health care provider may do certain tests to screen for prostate and testicular cancer.  Talk to your health care provider about any symptoms or concerns you have about testicular or prostate cancer.  Skin Cancer  Check your skin from head to toe regularly.  Tell your health care provider about any new moles or changes in moles, especially if: ? There is a change in a mole's size, shape, or color. ? You have a mole that is larger than a pencil eraser.  Always use sunscreen. Apply sunscreen liberally and repeat throughout the day.  Protect yourself by wearing long sleeves, pants, a wide-brimmed hat, and sunglasses when outside.  What  should I know about heart disease, diabetes, and high blood pressure?  If you are 66-76 years of age, have your blood pressure checked every 3-5 years. If you are 70 years of age or older, have your blood pressure checked every year. You should have your blood pressure measured twice-once when you are at a hospital or clinic, and once when you are not at a hospital or clinic. Record the average of the two measurements. To check your blood pressure when you are not at a hospital or clinic, you can use: ? An automated blood pressure  machine at a pharmacy. ? A home blood pressure monitor.  Talk to your health care provider about your target blood pressure.  If you are between 39-27 years old, ask your health care provider if you should take aspirin to prevent heart disease.  Have regular diabetes screenings by checking your fasting blood sugar level. ? If you are at a normal weight and have a low risk for diabetes, have this test once every three years after the age of 75. ? If you are overweight and have a high risk for diabetes, consider being tested at a younger age or more often.  A one-time screening for abdominal aortic aneurysm (AAA) by ultrasound is recommended for men aged 85-75 years who are current or former smokers. What should I know about preventing infection? Hepatitis B If you have a higher risk for hepatitis B, you should be screened for this virus. Talk with your health care provider to find out if you are at risk for hepatitis B infection. Hepatitis C Blood testing is recommended for:  Everyone born from [birth year] through 1963.  Anyone with known risk factors for hepatitis C.  Sexually Transmitted Diseases (STDs)  You should be screened each year for STDs including gonorrhea and chlamydia if: ? You are sexually active and are younger than 70 years of age. ? You are older than 70 years of age and your health care provider tells you that you are at risk for this type of infection. ? Your sexual activity has changed since you were last screened and you are at an increased risk for chlamydia or gonorrhea. Ask your health care provider if you are at risk.  Talk with your health care provider about whether you are at high risk of being infected with HIV. Your health care provider may recommend a prescription medicine to help prevent HIV infection.  What else can I do?  Schedule regular health, dental, and eye exams.  Stay current with your vaccines (immunizations).  Do not use any tobacco products,  such as cigarettes, chewing tobacco, and e-cigarettes. If you need help quitting, ask your health care provider.  Limit alcohol intake to no more than 2 drinks per day. One drink equals 12 ounces of beer, 5 ounces of wine, or 1 ounces of hard liquor.  Do not use street drugs.  Do not share needles.  Ask your health care provider for help if you need support or information about quitting drugs.  Tell your health care provider if you often feel depressed.  Tell your health care provider if you have ever been abused or do not feel safe at home. This information is not intended to replace advice given to you by your health care provider. Make sure you discuss any questions you have with your health care provider. Document Released: 03/07/2008 Document Revised: 05/08/2016 Document Reviewed: 06/13/2015 Elsevier Interactive Patient Education  [employer].  IF you received an x-ray today, you will receive an invoice from [facility]. Please contact [facility] at [phone] with questions or concerns regarding your invoice.   IF you received labwork today, you will receive an invoice from [facility]. Please contact [facility] at [phone] with questions or concerns regarding your invoice.   Our billing staff will not be able to assist you with questions regarding bills from these companies.  You will be contacted with the lab results as soon as they are available. The fastest way to get your results is to activate your My Chart account. Instructions are located on the last page of this paperwork. If you have not heard from us regarding the results in 2 weeks, please contact this office.      

## 2017-11-28 ENCOUNTER — Telehealth: Payer: Self-pay | Admitting: Cardiology

## 2017-11-28 ENCOUNTER — Encounter: Payer: Self-pay | Admitting: Cardiology

## 2017-11-28 NOTE — Telephone Encounter (Signed)
Patient currently on losartan 25mg  daily (per med list review).  Increase dose to 50mg  daily and schedule f/u visit with HTN clinic (next available) if patient agree.  Bring records of home BP reading and home BP monitor

## 2017-11-28 NOTE — Telephone Encounter (Signed)
New Message:    Pt says every since he had the stent put in,his blood pressure have been running high.He says he would like something for this.

## 2017-11-28 NOTE — Progress Notes (Signed)
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  Gregory Hew, MD

## 2017-11-28 NOTE — Telephone Encounter (Signed)
Advised patient, verbalized understanding. Scheduled appointment for 12/09/17 with Pharm D

## 2017-11-28 NOTE — Telephone Encounter (Signed)
Left message to call back  

## 2017-11-28 NOTE — Telephone Encounter (Signed)
Spoke with patient and he has been checking his blood pressure at home and SBP in the 150's. 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 Will forward to Pharm D for review

## 2017-11-28 NOTE — Telephone Encounter (Signed)
Letter placed at front desk for pick up, patient is aware.

## 2017-12-01 ENCOUNTER — Ambulatory Visit: Payer: Medicare Other | Admitting: Family Medicine

## 2017-12-02 ENCOUNTER — Encounter: Payer: Self-pay | Admitting: Urgent Care

## 2017-12-02 ENCOUNTER — Ambulatory Visit (INDEPENDENT_AMBULATORY_CARE_PROVIDER_SITE_OTHER): Payer: Self-pay | Admitting: Urgent Care

## 2017-12-02 VITALS — BP 134/60

## 2017-12-02 DIAGNOSIS — R030 Elevated blood-pressure reading, without diagnosis of hypertension: Secondary | ICD-10-CM

## 2017-12-02 DIAGNOSIS — E119 Type 2 diabetes mellitus without complications: Secondary | ICD-10-CM

## 2017-12-02 DIAGNOSIS — I2510 Atherosclerotic heart disease of native coronary artery without angina pectoris: Secondary | ICD-10-CM

## 2017-12-02 DIAGNOSIS — I10 Essential (primary) hypertension: Secondary | ICD-10-CM

## 2017-12-02 DIAGNOSIS — Z024 Encounter for examination for driving license: Secondary | ICD-10-CM

## 2017-12-02 DIAGNOSIS — Z955 Presence of coronary angioplasty implant and graft: Secondary | ICD-10-CM

## 2017-12-02 NOTE — Progress Notes (Signed)
    MRN: [id number] DOB: [birth year]  Subjective:   Gregory Mathews. is a 72 y.o. 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 He presents with letter from his cardiologist and also needs a blood pressure recheck.   Gregory Mathews has a current medication list which includes the following prescription(s): aspirin, blood glucose meter kit and supplies, clopidogrel, glipizide, lancets, losartan, metoprolol succinate, multivitamin, nitroglycerin, one touch ultra test, and pravastatin. Also is allergic to lisinopril and statins.  Gregory Mathews  has a past medical history of Allergy, Basal cell carcinoma (01/2013), CAD S/P percutaneous coronary angioplasty, Cataract, Diabetes mellitus without complication ([place]), Essential hypertension, Hyperlipidemia, and Rosacea. Also  has a past surgical history that includes Shoulder open rotator cuff repair (1983); Blepharoptosis repair; eyelid surgery; basal cell removal; Eye surgery; ETT/GXT: Graded Exercise Tolerance Test (08/2015); transthoracic echocardiogram (08/2017); CORONARY CT ANGIOGRAM (09/2017); LEFT HEART CATH AND CORONARY ANGIOGRAPHY (N/A, 11/12/2017); INTRAVASCULAR PRESSURE WIRE/FFR STUDY (N/A, 11/12/2017); and CORONARY STENT INTERVENTION (N/A, 11/12/2017).  Objective:   Vitals: BP 134/60   Physical Exam  Assessment and Plan :   Encounter for commercial driver medical examination (CDME)  Essential hypertension  Elevated blood pressure reading  Type 2 diabetes mellitus without complication, without long-term current use of insulin (HCC)  Coronary artery disease involving native coronary artery of native heart without angina pectoris  History of coronary artery stent placement  Patient provided letter of clearance from his cardiologist. His blood pressure is controlled. 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   Jaynee Eagles, PA-C [facility] [phone] 12/02/2017 3:29 PM

## 2017-12-09 ENCOUNTER — Telehealth: Payer: Self-pay | Admitting: Cardiology

## 2017-12-09 ENCOUNTER — Ambulatory Visit: Payer: Medicare Other

## 2017-12-09 MED ORDER — CLOPIDOGREL BISULFATE 75 MG PO TABS: 75.0000 mg | ORAL_TABLET | Freq: Every day | ORAL | 5 refills | Status: DC

## 2017-12-09 NOTE — Telephone Encounter (Signed)
I did not call patient. Left message to call back Did refill Plavix as requested

## 2017-12-09 NOTE — Telephone Encounter (Signed)
Follow Up:      Returning your call from today. 

## 2017-12-09 NOTE — Telephone Encounter (Signed)
New Message    *STAT* If patient is at the pharmacy, call can be transferred to refill team.   1. Which medications need to be refilled? (please list name of each medication and dose if known) clopidogrel (PLAVIX) 75 MG tablet  2. Which pharmacy/location (including street and city if local pharmacy) is medication to be sent to? [facility] [address]   3. Do they need a 30 day or 90 day supply? [place]

## 2017-12-10 NOTE — Telephone Encounter (Signed)
Lets see if he can tolerate 1/2 tab every other day until next lab check.  Based upon the new results & his tolerance of reduced dose Pravastatin, will consider referral to [facility].  Glenetta Hew, MD

## 2017-12-10 NOTE — Telephone Encounter (Signed)
Returned call to patient no answer.LMTC. 

## 2017-12-10 NOTE — Telephone Encounter (Signed)
Spoke to patient advised plavix refill was sent to pharmacy yesterday.Stated he wanted to let Dr.Harding know he stopped taking pravastatin 3 days ago due pain in both lower legs.Advised I will let Dr.Harding know.

## 2017-12-17 NOTE — Telephone Encounter (Signed)
Patient made aware and verbalized his understanding. He will call back with an update if needed.

## 2017-12-17 NOTE — Telephone Encounter (Signed)
New Message:    Pt returning phone call from RN

## 2018-01-18 ENCOUNTER — Other Ambulatory Visit: Payer: Self-pay | Admitting: Cardiology

## 2018-01-20 ENCOUNTER — Other Ambulatory Visit: Payer: Self-pay | Admitting: Family Medicine

## 2018-01-23 ENCOUNTER — Telehealth (HOSPITAL_COMMUNITY): Payer: Self-pay

## 2018-01-23 NOTE — Telephone Encounter (Signed)
Attempted to follow up with patient in regards to Cardiac Rehab - lm on vm °

## 2018-01-23 NOTE — Telephone Encounter (Signed)
Patient returned phone call - Scheduled orientation on 03/17/18 at 1:30pm. 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 Went over insurance with patient and patient verbalized understanding. Mailed packet.

## 2018-02-02 ENCOUNTER — Other Ambulatory Visit: Payer: Self-pay | Admitting: Cardiology

## 2018-02-17 ENCOUNTER — Encounter: Payer: Self-pay | Admitting: Family Medicine

## 2018-02-20 ENCOUNTER — Telehealth: Payer: Self-pay | Admitting: *Deleted

## 2018-02-20 ENCOUNTER — Other Ambulatory Visit: Payer: Self-pay | Admitting: *Deleted

## 2018-02-20 DIAGNOSIS — E1169 Type 2 diabetes mellitus with other specified complication: Secondary | ICD-10-CM

## 2018-02-20 DIAGNOSIS — E785 Hyperlipidemia, unspecified: Principal | ICD-10-CM

## 2018-02-20 DIAGNOSIS — I25119 Atherosclerotic heart disease of native coronary artery with unspecified angina pectoris: Secondary | ICD-10-CM

## 2018-02-20 NOTE — Telephone Encounter (Signed)
-----   Message from Raiford Simmonds, RN sent at 11/20/2017  5:18 PM EST ----- LABS June 28 ,2019   [email or web address]  28,2019 --LIPID ,CMP

## 2018-02-20 NOTE — Telephone Encounter (Signed)
MAILED Rogelio Seen LABSLIP

## 2018-02-25 ENCOUNTER — Other Ambulatory Visit: Payer: Self-pay | Admitting: Physician Assistant

## 2018-03-11 ENCOUNTER — Encounter: Payer: Self-pay | Admitting: Emergency Medicine

## 2018-03-11 ENCOUNTER — Other Ambulatory Visit: Payer: Self-pay | Admitting: Family Medicine

## 2018-03-11 ENCOUNTER — Ambulatory Visit (INDEPENDENT_AMBULATORY_CARE_PROVIDER_SITE_OTHER): Payer: Medicare Other | Admitting: Emergency Medicine

## 2018-03-11 ENCOUNTER — Other Ambulatory Visit: Payer: Self-pay

## 2018-03-11 VITALS — BP 118/62 | HR 67 | Temp 98.1°F | Resp 16 | Ht 68.5 in | Wt 215.6 lb

## 2018-03-11 DIAGNOSIS — I2510 Atherosclerotic heart disease of native coronary artery without angina pectoris: Secondary | ICD-10-CM | POA: Diagnosis not present

## 2018-03-11 DIAGNOSIS — E119 Type 2 diabetes mellitus without complications: Secondary | ICD-10-CM | POA: Diagnosis not present

## 2018-03-11 DIAGNOSIS — I10 Essential (primary) hypertension: Secondary | ICD-10-CM | POA: Diagnosis not present

## 2018-03-11 DIAGNOSIS — Z23 Encounter for immunization: Secondary | ICD-10-CM

## 2018-03-11 DIAGNOSIS — Z789 Other specified health status: Secondary | ICD-10-CM | POA: Diagnosis not present

## 2018-03-11 LAB — COMPREHENSIVE METABOLIC PANEL
A/G RATIO: 1.8 (ref 1.2–2.2)
ALT: 22 IU/L (ref 0–44)
AST: 22 IU/L (ref 0–40)
Albumin: 4.8 g/dL (ref 3.5–4.8)
Alkaline Phosphatase: 62 IU/L (ref 39–117)
BUN/Creatinine Ratio: 16 (ref 10–24)
BUN: 14 mg/dL (ref 8–27)
Bilirubin Total: 0.5 mg/dL (ref 0.0–1.2)
CALCIUM: 10.1 mg/dL (ref 8.6–10.2)
CO2: 22 mmol/L (ref 20–29)
CREATININE: 0.87 mg/dL (ref 0.76–1.27)
Chloride: 102 mmol/L (ref 96–106)
GFR, EST AFRICAN AMERICAN: 101 mL/min/{1.73_m2} (ref >59)
GFR, EST NON AFRICAN AMERICAN: 87 mL/min/{1.73_m2} (ref >59)
Globulin, Total: 2.6 g/dL (ref 1.5–4.5)
Glucose: 126 mg/dL — ABNORMAL HIGH (ref 65–99)
POTASSIUM: 4.9 mmol/L (ref 3.5–5.2)
Sodium: 141 mmol/L (ref 134–144)
TOTAL PROTEIN: 7.4 g/dL (ref 6.0–8.5)

## 2018-03-11 LAB — LIPID PANEL
CHOL/HDL RATIO: 5.2 ratio — AB (ref 0.0–5.0)
Cholesterol, Total: 193 mg/dL (ref 100–199)
HDL: 37 mg/dL — AB (ref >39)
LDL CALC: 125 mg/dL — AB (ref 0–99)
Triglycerides: 156 mg/dL — ABNORMAL HIGH (ref 0–149)
VLDL CHOLESTEROL CAL: 31 mg/dL (ref 5–40)

## 2018-03-11 LAB — POCT GLYCOSYLATED HEMOGLOBIN (HGB A1C): HEMOGLOBIN A1C: 7.3 % — AB (ref 4.0–5.6)

## 2018-03-11 MED ORDER — SITAGLIPTIN PHOSPHATE 100 MG PO TABS: 100.0000 mg | ORAL_TABLET | Freq: Every day | ORAL | 1 refills | Status: DC

## 2018-03-11 NOTE — Assessment & Plan Note (Signed)
Hemoglobin A1c still elevated at 7.3, little higher than before.  Patient taking glipizide 5 mg twice a day.  Has not tolerated metformin in the past.  We will introduce today a second medication.  Has normal renal function as of last February.  Start Januvia 100 mg daily today.  Will reassess in 3 months.  Continue diet and exercise.

## 2018-03-11 NOTE — Progress Notes (Signed)
Gregory Mathews. 72 y.o.   Chief Complaint  Patient presents with  . Diabetes    follow up    HISTORY OF PRESENT ILLNESS: This is a 72 y.o. male with multiple medical problems here for follow-up.  Patient has a history of type 2 diabetes taking glipizide 5 mg twice a day. Not on Metformin due to side effects.  Glucose at home with an average of 130-140.  Also has a history of hypertension. Blood pressure has been high but controlled over the past several days.  Taking losartan and Toprol. Cardiac cath done last February showed 80% obstruction of left coronary artery.  Stented successfully.  On Plavix.  No need to use sublingual nitroglycerin.  No history of MI. Also inquiring about circumcision.  Would like to see urologist about this.  HPI   Prior to Admission medications   Medication Sig Start Date End Date Taking? Authorizing Provider  aspirin 81 MG tablet Take 81 mg by mouth every other day.    Yes [provider]  clopidogrel (PLAVIX) 75 MG tablet Take 1 tablet (75 mg total) by mouth daily. 12/09/17  Yes Leonie Man, MD  glipiZIDE (GLUCOTROL) 5 MG tablet Take by mouth daily before breakfast.   Yes [provider]  losartan (COZAAR) 25 MG tablet TAKE 1 TABLET BY MOUTH EVERY DAY 01/20/18  Yes Wardell Honour, MD  metoprolol succinate (TOPROL-XL) 25 MG 24 hr tablet TAKE 1 TABLET BY MOUTH EVERY DAY 02/03/18  Yes Cheryln Manly, NP  Multiple Vitamin (MULTIVITAMIN) tablet Take 1 tablet by mouth daily.   Yes [provider]  nitroGLYCERIN (NITROSTAT) 0.4 MG SL tablet Place 1 tablet (0.4 mg total) under the tongue every 5 (five) minutes x 3 doses as needed for chest pain. 01/19/18  Yes Leonie Man, MD  blood glucose meter kit and supplies Use to test blood sugar daily. Dx: E11.9 10/11/15   Tereasa Coop, PA-C  Lancets MISC Use to test blood sugar daily. Dx code :E11.9 10/11/15   Tereasa Coop, PA-C  losartan (COZAAR) 50 MG tablet Take 50 mg by  mouth daily.    [provider]  ONE TOUCH ULTRA TEST test strip TEST BLOOD SUGAR DAILY 02/25/18   Tereasa Coop, PA-C    Allergies  Allergen Reactions  . Lisinopril Cough  . Statins Other (See Comments)    Severe leg pain & fatigue -- tried at least 4 (even low dose)    Patient Active Problem List   Diagnosis Date Noted  . Coronary artery disease involving native coronary artery of native heart with angina pectoris ([place]) 11/04/2017  . Abnormal findings on diagnostic imaging of cardiovascular system 11/04/2017  . Pre-op testing 09/22/2017  . Agatston coronary artery calcium score between 100 and 199 09/22/2017  . Family history of early CAD 09/11/2017  . Mild aortic stenosis by prior echocardiogram 09/11/2017  . Metabolic syndrome 07/68/0881  . 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 BMI 31.0-31.9,adult 03/13/2016  . Atypical chest pain 08/29/2015  . Essential hypertension 08/29/2015  . Diverticulosis of colon without hemorrhage 08/01/2015  . Statin intolerance 08/01/2015  . Basal cell carcinoma of nose 08/23/2014  . Rosacea 08/23/2014  . Seasonal allergies 01/26/2013  . Diabetes mellitus type 2, uncomplicated ([place]) 07/23/5944  . Hyperlipidemia associated with type 2 diabetes mellitus ([place]) 07/09/2012  . Erectile dysfunction 07/09/2012    Past Medical History:  Diagnosis Date  . Allergy   . Basal cell carcinoma 01/2013  Nasal and R forearm.  [facility]  . CAD S/P percutaneous coronary angioplasty    2/19 PCI/DESx1 to mLAD, FFR 0.7 -Sierra DES 2.75 mm x 18 mm  . Cataract   . Diabetes mellitus without complication (HCC)    on PO Meds  . Essential hypertension   . Hyperlipidemia    statin intolerant -- myalgias, fatigue (tried at least 4)  . Rosacea     Past Surgical History:  Procedure Laterality Date  . basal cell removal     nose and wrist   . BELPHAROPTOSIS REPAIR    . CORONARY CT ANGIOGRAM  09/2017   Coronary Calcium Score: 111. Coronary  calcification noted in the LEFT MAIN (LM) and prox LAD.  LM < 50% calcified stenosis.  Mid LAD 50-75% plaque noted CT FFR --> positive at 0.80.  Recommend CARDIAC CATHETERIZATION  . CORONARY STENT INTERVENTION N/A 11/12/2017   Procedure: CORONARY STENT INTERVENTION;  Surgeon: Leonie Man, MD;  Location: [place] [facility];  Service: Cardiovascular:  DES PCI:  STENT SIERRA 2.75 X 18 MM - Post intervention, there is a 0% residual stenosis.  Marland Kitchen ETT/GXT: Graded Exercise Tolerance Test  08/2015    Exercised for 9:01 min --> blunted blood pressure response no EKG changes.  No chest pain.  Low Risk  . EYE SURGERY     Cataract surgery  . eyelid surgery    . INTRAVASCULAR PRESSURE WIRE/FFR STUDY N/A 11/12/2017   Procedure: INTRAVASCULAR PRESSURE WIRE/FFR STUDY;  Surgeon: Leonie Man, MD;  Location: [place] [facility];  Service: Cardiovascular;  Laterality: LAD -FFR 0.76  . LEFT HEART CATH AND CORONARY ANGIOGRAPHY N/A 11/12/2017   Procedure: LEFT HEART CATH AND CORONARY ANGIOGRAPHY;  Surgeon: Leonie Man, MD;  Location: [facility]: 70% P-M LAD (FFR 0.76).  40% mid LAD, 50% ostial ramus.  EF 55-60%.  Mildly elevated LVEDP.  Marland Kitchen SHOULDER OPEN ROTATOR CUFF REPAIR  1983  . TRANSTHORACIC ECHOCARDIOGRAM  08/2017   EF 55-60%.  Mild aortic stenosis (mean gradient 11 mmH.)  GR 1 DD.  Mild LA dilation.    Social History   Socioeconomic History  . Marital status: Married    Spouse name: [name]  . Number of children: 4  . Years of education: 47  . Highest education level: Not on file  Occupational History  . Occupation: bus Engineer, manufacturing systems: [employer]  . Financial resource strain: Not on file  . Food insecurity:    Worry: Not on file    Inability: Not on file  . Transportation needs:    Medical: Not on file    Non-medical: Not on file  Tobacco Use  . Smoking status: Former Smoker    Packs/day: 1.00    Years: 15.00    Pack years: 15.00     Last attempt to quit: 07/09/1982    Years since quitting: 35.6  . Smokeless tobacco: Never Used  Substance and Sexual Activity  . Alcohol use: No  . Drug use: No  . Sexual activity: Yes    Partners: Female  Lifestyle  . Physical activity:    Days per week: Not on file    Minutes per session: Not on file  . Stress: Not on file  Relationships  . Social connections:    Talks on phone: Not on file    Gets together: Not on file    Attends religious service: Not on file  Active member of club or organization: Not on file    Attends meetings of clubs or organizations: Not on file    Relationship status: Not on file  . Intimate partner violence:    Fear of current or ex partner: Not on file    Emotionally abused: Not on file    Physically abused: Not on file    Forced sexual activity: Not on file  Other Topics Concern  . Not on file  Social History Narrative   Marital status: married x 75 years; happily married.       Children:  4 children; 4 grandchildren. Adult children all live locally.      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      Tobacco:  In past; smoked x 15 years; quit 35 years ago.      Alcohol: none      Exercise:sporadic in 2017.      ADLs: independent with all ADLs.  Does not use assistant device with ambulation.      Living Will:  Has living will; desires FULL CODE; no prolonged measures.      Doctoral degree in Biblical Studies.        Family History  Problem Relation Age of Onset  . Leukemia Mother   . Stroke Father 60       as a complication of CABG  . Colon cancer Father 23  . Cancer Father 74       Colon cancer  . Coronary artery disease Father 33       - Sx was DOE - MV CAD  . Peripheral Artery Disease Father        presumptive  . Diabetes Sister   . Sudden Cardiac Death Sister 81       presumed MI  . Heart attack Brother 81       During throat surgery   . Congestive Heart Failure Brother        Died @ 79 - ? CHF / ICM  . Cancer Maternal Grandmother   . Stroke Maternal Grandfather   . Diabetes Brother 64       on lots of meds  . Diabetes Brother   . Cancer Brother        colon cancer  . Rheumatic fever Brother        childhood RF --> Valvular Dz - valve Sgx,  died young     Review of Systems  Constitutional: Negative.  Negative for chills, fever and weight loss.  HENT: Negative.  Negative for congestion, hearing loss, nosebleeds and sore throat.   Eyes: Negative.  Negative for blurred vision and double vision.  Respiratory: Negative.  Negative for cough and shortness of breath.   Cardiovascular: Negative.  Negative for chest pain and palpitations.  Gastrointestinal: Negative.  Negative for abdominal pain, diarrhea, nausea and vomiting.  Genitourinary: Negative.  Negative for dysuria and hematuria.  Musculoskeletal: Negative.  Negative for myalgias and neck pain.  Skin: Negative.  Negative for rash.  Neurological: Negative.  Negative for dizziness, focal weakness, weakness and headaches.  Endo/Heme/Allergies: Negative.   All other systems reviewed and are negative.   Vitals:   03/11/18 0837  BP: 118/62  Pulse: 67  Resp: 16  Temp: 98.1 F (36.7 C)  SpO2: 97%    Physical Exam  Constitutional: He is oriented to person, place, and time. He appears well-developed  and well-nourished.  HENT:  Head: Normocephalic and atraumatic.  Right Ear: External ear normal.  Left Ear: External ear normal.  Nose: Nose normal.  Mouth/Throat: Oropharynx is clear and moist.  Eyes: Pupils are equal, round, and reactive to light. Conjunctivae are normal.  Neck: Normal range of motion. Neck supple. No JVD present. No thyromegaly present.  Cardiovascular: Normal rate, regular rhythm and normal heart sounds.  Pulmonary/Chest: Effort normal and breath sounds normal.  Abdominal: Soft. Bowel sounds are normal. He exhibits no distension. There is  no tenderness.  Musculoskeletal: Normal range of motion. He exhibits no edema or tenderness.  Lymphadenopathy:    He has no cervical adenopathy.  Neurological: He is alert and oriented to person, place, and time. No sensory deficit. He exhibits normal muscle tone. Coordination normal.  Skin: Skin is warm and dry. Capillary refill takes less than 2 seconds. No rash noted.  Psychiatric: He has a normal mood and affect. His behavior is normal.  Vitals reviewed.  Results for orders placed or performed in visit on 03/11/18 (from the past 24 hour(s))  POCT glycosylated hemoglobin (Hb A1C)     Status: Abnormal   Collection Time: 03/11/18  9:29 AM  Result Value Ref Range   Hemoglobin A1C 7.3 (A) 4.0 - 5.6 %   HbA1c, POC (prediabetic range)  5.7 - 6.4 %   HbA1c, POC (controlled diabetic range)  0.0 - 7.0 %   Diabetes mellitus type 2, uncomplicated (HCC) Hemoglobin A1c still elevated at 7.3, little higher than before.  Patient taking glipizide 5 mg twice a day.  Has not tolerated metformin in the past.  We will introduce today a second medication.  Has normal renal function as of last February.  Start Januvia 100 mg daily today.  Will reassess in 3 months.  Continue diet and exercise.   ASSESSMENT & PLAN: Ovide was seen today for diabetes.  Diagnoses and all orders for this visit:  Type 2 diabetes mellitus without complication, without long-term current use of insulin (HCC) -     Comprehensive metabolic panel -     Lipid panel -     POCT glycosylated hemoglobin (Hb A1C) -     sitaGLIPtin (JANUVIA) 100 MG tablet; Take 1 tablet (100 mg total) by mouth daily.  Need for 23-polyvalent pneumococcal polysaccharide vaccine -     Pneumococcal polysaccharide vaccine 23-valent greater than or equal to 2yo subcutaneous/IM  Essential hypertension -     Comprehensive metabolic panel  Coronary artery disease involving native coronary artery of native heart without angina pectoris  Uncircumcised  male -     Ambulatory referral to Urology    Patient Instructions       IF you received an x-ray today, you will receive an invoice from [facility]. Please contact [facility] at [phone] with questions or concerns regarding your invoice.   IF you received labwork today, you will receive an invoice from [facility]. Please contact [facility] at [phone] with questions or concerns regarding your invoice.   Our billing staff will not be able to assist you with questions regarding bills from these companies.  You will be contacted with the lab results as soon as they are available. The fastest way to get your results is to activate your My Chart account. Instructions are located on the last page of this paperwork. If you have not heard from [place] regarding the results in 2 weeks, please contact this office.     Diabetes Mellitus and Nutrition When  you have diabetes (diabetes mellitus), it is very important to have healthy eating habits because your blood sugar (glucose) levels are greatly affected by what you eat and drink. Eating healthy foods in the appropriate amounts, at about the same times every day, can help you:  Control your blood glucose.  Lower your risk of heart disease.  Improve your blood pressure.  Reach or maintain a healthy weight.  Every person with diabetes is different, and each person has different needs for a meal plan. Your health care provider may recommend that you work with a diet and nutrition specialist (dietitian) to make a meal plan that is best for you. Your meal plan may vary depending on factors such as:  The calories you need.  The medicines you take.  Your weight.  Your blood glucose, blood pressure, and cholesterol levels.  Your activity level.  Other health conditions you have, such as heart or kidney disease.  How do carbohydrates affect me? Carbohydrates affect your blood glucose level more than any other type of  food. Eating carbohydrates naturally increases the amount of glucose in your blood. Carbohydrate counting is a method for keeping track of how many carbohydrates you eat. Counting carbohydrates is important to keep your blood glucose at a healthy level, especially if you use insulin or take certain oral diabetes medicines. It is important to know how many carbohydrates you can safely have in each meal. This is different for every person. Your dietitian can help you calculate how many carbohydrates you should have at each meal and for snack. Foods that contain carbohydrates include:  Bread, cereal, rice, pasta, and crackers.  Potatoes and corn.  Peas, beans, and lentils.  Milk and yogurt.  Fruit and juice.  Desserts, such as cakes, cookies, ice cream, and candy.  How does alcohol affect me? Alcohol can cause a sudden decrease in blood glucose (hypoglycemia), especially if you use insulin or take certain oral diabetes medicines. Hypoglycemia can be a life-threatening condition. Symptoms of hypoglycemia (sleepiness, dizziness, and confusion) are similar to symptoms of having too much alcohol. If your health care provider says that alcohol is safe for you, follow these guidelines:  Limit alcohol intake to no more than 1 drink per day for nonpregnant women and 2 drinks per day for men. One drink equals 12 oz of beer, 5 oz of wine, or 1 oz of hard liquor.  Do not drink on an empty stomach.  Keep yourself hydrated with water, diet soda, or unsweetened iced tea.  Keep in mind that regular soda, juice, and other mixers may contain a lot of sugar and must be counted as carbohydrates.  What are tips for following this plan? Reading food labels  Start by checking the serving size on the label. The amount of calories, carbohydrates, fats, and other nutrients listed on the label are based on one serving of the food. Many foods contain more than one serving per package.  Check the total grams (g)  of carbohydrates in one serving. You can calculate the number of servings of carbohydrates in one serving by dividing the total carbohydrates by 15. For example, if a food has 30 g of total carbohydrates, it would be equal to 2 servings of carbohydrates.  Check the number of grams (g) of saturated and trans fats in one serving. Choose foods that have low or no amount of these fats.  Check the number of milligrams (mg) of sodium in one serving. Most people should limit total sodium  intake to less than 2,300 mg per day.  Always check the nutrition information of foods labeled as "low-fat" or "nonfat". These foods may be higher in added sugar or refined carbohydrates and should be avoided.  Talk to your dietitian to identify your daily goals for nutrients listed on the label. Shopping  Avoid buying canned, premade, or processed foods. These foods tend to be high in fat, sodium, and added sugar.  Shop around the outside edge of the grocery store. This includes fresh fruits and vegetables, bulk grains, fresh meats, and fresh dairy. Cooking  Use low-heat cooking methods, such as baking, instead of high-heat cooking methods like deep frying.  Cook using healthy oils, such as olive, canola, or sunflower oil.  Avoid cooking with butter, cream, or high-fat meats. Meal planning  Eat meals and snacks regularly, preferably at the same times every day. Avoid going long periods of time without eating.  Eat foods high in fiber, such as fresh fruits, vegetables, beans, and whole grains. Talk to your dietitian about how many servings of carbohydrates you can eat at each meal.  Eat 4-6 ounces of lean protein each day, such as lean meat, chicken, fish, eggs, or tofu. 1 ounce is equal to 1 ounce of meat, chicken, or fish, 1 egg, or 1/4 cup of tofu.  Eat some foods each day that contain healthy fats, such as avocado, nuts, seeds, and fish. Lifestyle   Check your blood glucose regularly.  Exercise at  least 30 minutes 5 or more days each week, or as told by your health care provider.  Take medicines as told by your health care provider.  Do not use any products that contain nicotine or tobacco, such as cigarettes and e-cigarettes. If you need help quitting, ask your health care provider.  Work with a Social worker or diabetes educator to identify strategies to manage stress and any emotional and social challenges. What are some questions to ask my health care provider?  Do I need to meet with a diabetes educator?  Do I need to meet with a dietitian?  What number can I call if I have questions?  When are the best times to check my blood glucose? Where to find more information:  American Diabetes Association: diabetes.org/food-and-fitness/food  Academy of Nutrition and Dietetics: PokerClues.dk  [employer] of Diabetes and Digestive and Kidney Diseases (NIH): ContactWire.be Summary  A healthy meal plan will help you control your blood glucose and maintain a healthy lifestyle.  Working with a diet and nutrition specialist (dietitian) can help you make a meal plan that is best for you.  Keep in mind that carbohydrates and alcohol have immediate effects on your blood glucose levels. It is important to count carbohydrates and to use alcohol carefully. This information is not intended to replace advice given to you by your health care provider. Make sure you discuss any questions you have with your health care provider. Document Released: 06/06/2005 Document Revised: 10/14/2016 Document Reviewed: 10/14/2016 Elsevier Interactive Patient Education  2018 [employer].      Agustina Caroli, MD [facility]

## 2018-03-11 NOTE — Patient Instructions (Addendum)
IF you received an x-ray today, you will receive an invoice from [facility]. Please contact [facility] at [phone] with questions or concerns regarding your invoice.   IF you received labwork today, you will receive an invoice from [place]. Please contact [facility] at [phone] with questions or concerns regarding your invoice.   Our billing staff will not be able to assist you with questions regarding bills from these companies.  You will be contacted with the lab results as soon as they are available. The fastest way to get your results is to activate your My Chart account. Instructions are located on the last page of this paperwork. If you have not heard from [place] regarding the results in 2 weeks, please contact this office.     Diabetes Mellitus and Nutrition When you have diabetes (diabetes mellitus), it is very important to have healthy eating habits because your blood sugar (glucose) levels are greatly affected by what you eat and drink. Eating healthy foods in the appropriate amounts, at about the same times every day, can help you:  Control your blood glucose.  Lower your risk of heart disease.  Improve your blood pressure.  Reach or maintain a healthy weight.  Every person with diabetes is different, and each person has different needs for a meal plan. Your health care provider may recommend that you work with a diet and nutrition specialist (dietitian) to make a meal plan that is best for you. Your meal plan may vary depending on factors such as:  The calories you need.  The medicines you take.  Your weight.  Your blood glucose, blood pressure, and cholesterol levels.  Your activity level.  Other health conditions you have, such as heart or kidney disease.  How do carbohydrates affect me? Carbohydrates affect your blood glucose level more than any other type of food. Eating carbohydrates naturally increases the amount of glucose in your  blood. Carbohydrate counting is a method for keeping track of how many carbohydrates you eat. Counting carbohydrates is important to keep your blood glucose at a healthy level, especially if you use insulin or take certain oral diabetes medicines. It is important to know how many carbohydrates you can safely have in each meal. This is different for every person. Your dietitian can help you calculate how many carbohydrates you should have at each meal and for snack. Foods that contain carbohydrates include:  Bread, cereal, rice, pasta, and crackers.  Potatoes and corn.  Peas, beans, and lentils.  Milk and yogurt.  Fruit and juice.  Desserts, such as cakes, cookies, ice cream, and candy.  How does alcohol affect me? Alcohol can cause a sudden decrease in blood glucose (hypoglycemia), especially if you use insulin or take certain oral diabetes medicines. Hypoglycemia can be a life-threatening condition. Symptoms of hypoglycemia (sleepiness, dizziness, and confusion) are similar to symptoms of having too much alcohol. If your health care provider says that alcohol is safe for you, follow these guidelines:  Limit alcohol intake to no more than 1 drink per day for nonpregnant women and 2 drinks per day for men. One drink equals 12 oz of beer, 5 oz of wine, or 1 oz of hard liquor.  Do not drink on an empty stomach.  Keep yourself hydrated with water, diet soda, or unsweetened iced tea.  Keep in mind that regular soda, juice, and other mixers may contain a lot of sugar and must be counted as carbohydrates.  What are tips for following  this plan? Reading food labels  Start by checking the serving size on the label. The amount of calories, carbohydrates, fats, and other nutrients listed on the label are based on one serving of the food. Many foods contain more than one serving per package.  Check the total grams (g) of carbohydrates in one serving. You can calculate the number of servings of  carbohydrates in one serving by dividing the total carbohydrates by 15. For example, if a food has 30 g of total carbohydrates, it would be equal to 2 servings of carbohydrates.  Check the number of grams (g) of saturated and trans fats in one serving. Choose foods that have low or no amount of these fats.  Check the number of milligrams (mg) of sodium in one serving. Most people should limit total sodium intake to less than 2,300 mg per day.  Always check the nutrition information of foods labeled as "low-fat" or "nonfat". These foods may be higher in added sugar or refined carbohydrates and should be avoided.  Talk to your dietitian to identify your daily goals for nutrients listed on the label. Shopping  Avoid buying canned, premade, or processed foods. These foods tend to be high in fat, sodium, and added sugar.  Shop around the outside edge of the grocery store. This includes fresh fruits and vegetables, bulk grains, fresh meats, and fresh dairy. Cooking  Use low-heat cooking methods, such as baking, instead of high-heat cooking methods like deep frying.  Cook using healthy oils, such as olive, canola, or sunflower oil.  Avoid cooking with butter, cream, or high-fat meats. Meal planning  Eat meals and snacks regularly, preferably at the same times every day. Avoid going long periods of time without eating.  Eat foods high in fiber, such as fresh fruits, vegetables, beans, and whole grains. Talk to your dietitian about how many servings of carbohydrates you can eat at each meal.  Eat 4-6 ounces of lean protein each day, such as lean meat, chicken, fish, eggs, or tofu. 1 ounce is equal to 1 ounce of meat, chicken, or fish, 1 egg, or 1/4 cup of tofu.  Eat some foods each day that contain healthy fats, such as avocado, nuts, seeds, and fish. Lifestyle   Check your blood glucose regularly.  Exercise at least 30 minutes 5 or more days each week, or as told by your health care  provider.  Take medicines as told by your health care provider.  Do not use any products that contain nicotine or tobacco, such as cigarettes and e-cigarettes. If you need help quitting, ask your health care provider.  Work with a Social worker or diabetes educator to identify strategies to manage stress and any emotional and social challenges. What are some questions to ask my health care provider?  Do I need to meet with a diabetes educator?  Do I need to meet with a dietitian?  What number can I call if I have questions?  When are the best times to check my blood glucose? Where to find more information:  American Diabetes Association: diabetes.org/food-and-fitness/food  Academy of Nutrition and Dietetics: PokerClues.dk  [employer] of Diabetes and Digestive and Kidney Diseases (NIH): ContactWire.be Summary  A healthy meal plan will help you control your blood glucose and maintain a healthy lifestyle.  Working with a diet and nutrition specialist (dietitian) can help you make a meal plan that is best for you.  Keep in mind that carbohydrates and alcohol have immediate effects on your blood glucose  levels. It is important to count carbohydrates and to use alcohol carefully. This information is not intended to replace advice given to you by your health care provider. Make sure you discuss any questions you have with your health care provider. Document Released: 06/06/2005 Document Revised: 10/14/2016 Document Reviewed: 10/14/2016 Elsevier Interactive Patient Education  [employer].

## 2018-03-12 NOTE — Telephone Encounter (Signed)
Refill of Glipizide by historical provider  LOV 03/11/18    Dr. Mitchel Honour   [facility] 01/29/18   [id number]   [address]

## 2018-03-13 ENCOUNTER — Telehealth (HOSPITAL_COMMUNITY): Payer: Self-pay | Admitting: Pharmacist

## 2018-03-13 NOTE — Telephone Encounter (Signed)
Cardiac Rehab Medication Review by a Pharmacist  Does the patientfeel that his/her medications are working for him/her?  yes  Has the patient been experiencing any side effects to the medications prescribed?  no  Does the patient measure his/her own blood pressure or blood glucose at home?  yes - SBP around 120s, sugars low 100s  Does the patient have any problems obtaining medications due to transportation or finances?   no  Understanding of regimen: good Understanding of indications: good Potential of compliance: good  Pharmacist comments: Patient presenting for cardiac rehab orientation. Easily able to recognize which medications he is taking and which he is not. Is waiting to hear from his doctor about his cholesterol and if he needs to start a different medication that is not a statin, since he has had bad reactions to them in the past.  Vashaun Osmon N. Gerarda Fraction, PharmD PGY1 Pharmacy Resident Phone: [phone] 03/13/2018  1:13 PM

## 2018-03-17 ENCOUNTER — Encounter (HOSPITAL_COMMUNITY)
Admission: RE | Admit: 2018-03-17 | Discharge: 2018-03-17 | Disposition: A | Discharge status: Home / Self Care | Payer: Medicare Other | Source: Ambulatory Visit | Attending: Cardiology | Admitting: Cardiology

## 2018-03-17 VITALS — Ht 68.0 in | Wt 215.8 lb

## 2018-03-17 DIAGNOSIS — Z955 Presence of coronary angioplasty implant and graft: Secondary | ICD-10-CM | POA: Diagnosis not present

## 2018-03-17 DIAGNOSIS — I2510 Atherosclerotic heart disease of native coronary artery without angina pectoris: Secondary | ICD-10-CM | POA: Insufficient documentation

## 2018-03-17 DIAGNOSIS — I10 Essential (primary) hypertension: Secondary | ICD-10-CM | POA: Diagnosis not present

## 2018-03-17 DIAGNOSIS — Z87891 Personal history of nicotine dependence: Secondary | ICD-10-CM | POA: Insufficient documentation

## 2018-03-17 DIAGNOSIS — Z7984 Long term (current) use of oral hypoglycemic drugs: Secondary | ICD-10-CM | POA: Insufficient documentation

## 2018-03-17 DIAGNOSIS — E119 Type 2 diabetes mellitus without complications: Secondary | ICD-10-CM | POA: Diagnosis not present

## 2018-03-17 DIAGNOSIS — Z85828 Personal history of other malignant neoplasm of skin: Secondary | ICD-10-CM | POA: Diagnosis not present

## 2018-03-17 DIAGNOSIS — Z7982 Long term (current) use of aspirin: Secondary | ICD-10-CM | POA: Insufficient documentation

## 2018-03-17 DIAGNOSIS — Z7902 Long term (current) use of antithrombotics/antiplatelets: Secondary | ICD-10-CM | POA: Diagnosis not present

## 2018-03-17 DIAGNOSIS — Z79899 Other long term (current) drug therapy: Secondary | ICD-10-CM | POA: Insufficient documentation

## 2018-03-17 DIAGNOSIS — E785 Hyperlipidemia, unspecified: Secondary | ICD-10-CM | POA: Insufficient documentation

## 2018-03-17 NOTE — Progress Notes (Signed)
Cardiac Individual Treatment Plan  Patient Details  Name: Gregory Mathews. MRN: [id number] Date of Birth: 10-Apr-1946 Referring Provider:   Flowsheet Row CARDIAC REHAB PHASE II ORIENTATION from 03/17/2018 in [place]  Referring Provider  Glenetta Hew MD      Initial Encounter Date:  [place] PHASE II ORIENTATION from 03/17/2018 in [place]  Date  03/17/18      Visit Diagnosis: Status post coronary artery stent placement  Patient's Home Medications on Admission:  Current Outpatient Medications:  .  aspirin 81 MG tablet, Take 81 mg by mouth every other day. , Disp: , Rfl:  .  blood glucose meter kit and supplies, Use to test blood sugar daily. Dx: E11.9, Disp: 1 each, Rfl: 0 .  clopidogrel (PLAVIX) 75 MG tablet, Take 1 tablet (75 mg total) by mouth daily., Disp: 30 tablet, Rfl: 5 .  glipiZIDE (GLUCOTROL) 5 MG tablet, Take by mouth daily before breakfast., Disp: , Rfl:  .  Lancets MISC, Use to test blood sugar daily. Dx code :E11.9, Disp: 100 each, Rfl: 3 .  losartan (COZAAR) 25 MG tablet, TAKE 1 TABLET BY MOUTH EVERY DAY, Disp: 90 tablet, Rfl: 1 .  metoprolol succinate (TOPROL-XL) 25 MG 24 hr tablet, TAKE 1 TABLET BY MOUTH EVERY DAY, Disp: 30 tablet, Rfl: 10 .  Multiple Vitamin (MULTIVITAMIN) tablet, Take 1 tablet by mouth daily., Disp: , Rfl:  .  nitroGLYCERIN (NITROSTAT) 0.4 MG SL tablet, Place 1 tablet (0.4 mg total) under the tongue every 5 (five) minutes x 3 doses as needed for chest pain., Disp: 25 tablet, Rfl: 3 .  ONE TOUCH ULTRA TEST test strip, TEST BLOOD SUGAR DAILY, Disp: 100 each, Rfl: 2 .  sitaGLIPtin (JANUVIA) 100 MG tablet, Take 1 tablet (100 mg total) by mouth daily. (Patient not taking: Reported on 03/13/2018), Disp: 90 tablet, Rfl: 1  Past Medical History: Past Medical History:  Diagnosis Date  . Allergy   . Basal cell carcinoma 01/2013   Nasal and R forearm.  [facility]  Dermatology  . CAD S/P percutaneous coronary angioplasty    2/19 PCI/DESx1 to mLAD, FFR 0.7 -Sierra DES 2.75 mm x 18 mm  . Cataract   . Diabetes mellitus without complication (HCC)    on PO Meds  . Essential hypertension   . Hyperlipidemia    statin intolerant -- myalgias, fatigue (tried at least 4)  . Rosacea     Tobacco Use: Social History   Tobacco Use  Smoking Status Former Smoker  . Packs/day: 1.00  . Years: 15.00  . Pack years: 15.00  . Last attempt to quit: 07/09/1982  . Years since quitting: 35.7  Smokeless Tobacco Never Used    Labs: Recent Chemical engineer    Labs for ITP Cardiac and Pulmonary Rehab Latest Ref Rng & Units 08/14/2016 11/29/2016 05/07/2017 08/22/2017 03/11/2018   Cholestrol 100 - 199 mg/dL 174 - 183 168 193   LDLCALC 0 - 99 mg/dL 113(H) - 121(H) 106(H) 125(H)   HDL >39 mg/dL 37(L) - 39(L) 38(L) 37(L)   Trlycerides 0 - 149 mg/dL 120 - 114 118 156(H)   Hemoglobin A1c 4.0 - 5.6 % 6.6(H) 7.4(H) 7.1 7.1(H) 7.3(A)      Capillary Blood Glucose: Lab Results  Component Value Date   GLUCAP 142 (H) 11/12/2017     Exercise Target Goals: Date: 03/17/18  Exercise Program Goal: Individual exercise prescription set using results from initial 6 min  walk test and THRR while considering  patient's activity barriers and safety.   Exercise Prescription Goal: Initial exercise prescription builds to 30-45 minutes a day of aerobic activity, 2-3 days per week.  Home exercise guidelines will be given to patient during program as part of exercise prescription that the participant will acknowledge.  Activity Barriers & Risk Stratification: Activity Barriers & Cardiac Risk Stratification - 03/17/18 1511    Activity Barriers & Cardiac Risk Stratification          Activity Barriers  Deconditioning;Muscular Weakness    Cardiac Risk Stratification  High           6 Minute Walk: 6 Minute Walk    6 Minute Walk    Row Name 03/17/18 1459   Phase  Initial    Distance  1919 feet   Walk Time  6 minutes   # of Rest Breaks  0   MPH  3.6   METS  3.7   RPE  10   Symptoms  No   Resting HR  65 bpm   Resting BP  120/70   Resting Oxygen Saturation   99 %   Exercise Oxygen Saturation  during 6 min walk  98 %   Max Ex. HR  91 bpm   Max Ex. BP  150/70          Oxygen Initial Assessment:   Oxygen Re-Evaluation:   Oxygen Discharge (Final Oxygen Re-Evaluation):   Initial Exercise Prescription: Initial Exercise Prescription - 03/17/18 1500    Date of Initial Exercise RX and Referring Provider          Date  03/17/18    Referring Provider  Glenetta Hew MD        Treadmill          MPH  3    Grade  0    Minutes  10    METs  3.3        Bike          Level  1.2    Minutes  10    METs  3.4        NuStep          Level  4    SPM  80    Minutes  10    METs  3        Prescription Details          Frequency (times per week)  3    Duration  Progress to 30 minutes of continuous aerobic without signs/symptoms of physical distress        Intensity          THRR 40-80% of Max Heartrate  60-120    Ratings of Perceived Exertion  11-15    Perceived Dyspnea  0-4        Progression          Progression  Continue to progress workloads to maintain intensity without signs/symptoms of physical distress.        Resistance Training          Training Prescription  Yes    Weight  4lbs    Reps  10-15           Perform Capillary Blood Glucose checks as needed.  Exercise Prescription Changes:   Exercise Comments:   Exercise Goals and Review: Exercise Goals    Exercise Goals    Row Name 03/17/18 1507   Increase Physical Activity  Yes  Intervention  Provide advice, education, support and counseling about physical activity/exercise needs.;Develop an individualized exercise prescription for aerobic and resistive training based on initial evaluation findings, risk stratification, comorbidities and participant's personal  goals.   Expected Outcomes  Short Term: Attend rehab on a regular basis to increase amount of physical activity.;Long Term: Add in home exercise to make exercise part of routine and to increase amount of physical activity.;Long Term: Exercising regularly at least 3-5 days a week.   Increase Strength and Stamina  Yes   Intervention  Provide advice, education, support and counseling about physical activity/exercise needs.;Develop an individualized exercise prescription for aerobic and resistive training based on initial evaluation findings, risk stratification, comorbidities and participant's personal goals.   Expected Outcomes  Short Term: Increase workloads from initial exercise prescription for resistance, speed, and METs.;Short Term: Perform resistance training exercises routinely during rehab and add in resistance training at home;Long Term: Improve cardiorespiratory fitness, muscular endurance and strength as measured by increased METs and functional capacity (6MWT)   Able to understand and use rate of perceived exertion (RPE) scale  Yes   Intervention  Provide education and explanation on how to use RPE scale   Expected Outcomes  Short Term: Able to use RPE daily in rehab to express subjective intensity level;Long Term:  Able to use RPE to guide intensity level when exercising independently   Knowledge and understanding of Target Heart Rate Range (THRR)  Yes   Intervention  Provide education and explanation of THRR including how the numbers were predicted and where they are located for reference   Expected Outcomes  Short Term: Able to state/look up THRR;Long Term: Able to use THRR to govern intensity when exercising independently;Short Term: Able to use daily as guideline for intensity in rehab   Able to check pulse independently  Yes   Intervention  Provide education and demonstration on how to check pulse in carotid and radial arteries.;Review the importance of being able to check your own pulse  for safety during independent exercise   Expected Outcomes  Short Term: Able to explain why pulse checking is important during independent exercise;Long Term: Able to check pulse independently and accurately   Understanding of Exercise Prescription  Yes   Intervention  Provide education, explanation, and written materials on patient's individual exercise prescription   Expected Outcomes  Short Term: Able to explain program exercise prescription;Long Term: Able to explain home exercise prescription to exercise independently          Exercise Goals Re-Evaluation :    Discharge Exercise Prescription (Final Exercise Prescription Changes):   Nutrition:  Target Goals: Understanding of nutrition guidelines, daily intake of sodium <156m, cholesterol <2045m calories 30% from fat and 7% or less from saturated fats, daily to have 5 or more servings of fruits and vegetables.  Biometrics: Pre Biometrics - 03/17/18 1508    Pre Biometrics          Height  '5\' 8"'  (1.727 m)    Weight  215 lb 13.3 oz (97.9 kg)    Waist Circumference  43.5 inches    Hip Circumference  41 inches    Waist to Hip Ratio  1.06 %    BMI (Calculated)  32.82    Triceps Skinfold  20 mm    % Body Fat  32.4 %    Grip Strength  45 kg    Flexibility  9 in    Single Leg Stand  8.03 seconds  Nutrition Therapy Plan and Nutrition Goals:   Nutrition Assessments:   Nutrition Goals Re-Evaluation:   Nutrition Goals Re-Evaluation:   Nutrition Goals Discharge (Final Nutrition Goals Re-Evaluation):   Psychosocial: Target Goals: Acknowledge presence or absence of significant depression and/or stress, maximize coping skills, provide positive support system. Participant is able to verbalize types and ability to use techniques and skills needed for reducing stress and depression.  Initial Review & Psychosocial Screening: Initial Psych Review & Screening - 03/17/18 1544    Initial Review          Current  issues with  None Identified        Family Dynamics          Good Support System?  No        Barriers          Psychosocial barriers to participate in program  There are no identifiable barriers or psychosocial needs.        Screening Interventions          Interventions  Encouraged to exercise           Quality of Life Scores: Quality of Life - 03/17/18 1401    Quality of Life          Select  Quality of Life        Quality of Life Scores          Health/Function Pre  24 %    Socioeconomic Pre  26 %    Psych/Spiritual Pre  30 %    Family Pre  30 %    GLOBAL Pre  26 %          Scores of 19 and below usually indicate a poorer quality of life in these areas.  A difference of  2-3 points is a clinically meaningful difference.  A difference of 2-3 points in the total score of the Quality of Life Index has been associated with significant improvement in overall quality of life, self-image, physical symptoms, and general health in studies assessing change in quality of life.  PHQ-9: Recent Review Flowsheet Data    Depression screen [facility] 2/9 03/11/2018 11/27/2017 08/22/2017 05/07/2017 04/05/2017   Decreased Interest 0 0 - 0 0   Down, Depressed, Hopeless 0 0 0 0 0   PHQ - 2 Score 0 0 0 0 0     Interpretation of Total Score  Total Score Depression Severity:  1-4 = Minimal depression, 5-9 = Mild depression, 10-14 = Moderate depression, 15-19 = Moderately severe depression, 20-27 = Severe depression   Psychosocial Evaluation and Intervention:   Psychosocial Re-Evaluation:   Psychosocial Discharge (Final Psychosocial Re-Evaluation):   Vocational Rehabilitation: Provide vocational rehab assistance to qualifying candidates.   Vocational Rehab Evaluation & Intervention:   Education: Education Goals: Education classes will be provided on a weekly basis, covering required topics. Participant will state understanding/return demonstration of topics presented.  Learning  Barriers/Preferences: Learning Barriers/Preferences - 03/17/18 1459    Learning Barriers/Preferences          Learning Barriers  Sight    Learning Preferences  Written Material;Video;Verbal Instruction;Skilled Demonstration;Pictoral           Education Topics: Count Your Pulse:  -Group instruction provided by verbal instruction, demonstration, patient participation and written materials to support subject.  Instructors address importance of being able to find your pulse and how to count your pulse when at home without a heart monitor.  Patients get hands on experience counting their pulse  with staff help and individually.   Heart Attack, Angina, and Risk Factor Modification:  -Group instruction provided by verbal instruction, video, and written materials to support subject.  Instructors address signs and symptoms of angina and heart attacks.    Also discuss risk factors for heart disease and how to make changes to improve heart health risk factors.   Functional Fitness:  -Group instruction provided by verbal instruction, demonstration, patient participation, and written materials to support subject.  Instructors address safety measures for doing things around the house.  Discuss how to get up and down off the floor, how to pick things up properly, how to safely get out of a chair without assistance, and balance training.   Meditation and Mindfulness:  -Group instruction provided by verbal instruction, patient participation, and written materials to support subject.  Instructor addresses importance of mindfulness and meditation practice to help reduce stress and improve awareness.  Instructor also leads participants through a meditation exercise.    Stretching for Flexibility and Mobility:  -Group instruction provided by verbal instruction, patient participation, and written materials to support subject.  Instructors lead participants through series of stretches that are designed to  increase flexibility thus improving mobility.  These stretches are additional exercise for major muscle groups that are typically performed during regular warm up and cool down.   Hands Only CPR:  -Group verbal, video, and participation provides a basic overview of AHA guidelines for community CPR. Role-play of emergencies allow participants the opportunity to practice calling for help and chest compression technique with discussion of AED use.   Hypertension: -Group verbal and written instruction that provides a basic overview of hypertension including the most recent diagnostic guidelines, risk factor reduction with self-care instructions and medication management.    Nutrition I class: Heart Healthy Eating:  -Group instruction provided by PowerPoint slides, verbal discussion, and written materials to support subject matter. The instructor gives an explanation and review of the Therapeutic Lifestyle Changes diet recommendations, which includes a discussion on lipid goals, dietary fat, sodium, fiber, plant stanol/sterol esters, sugar, and the components of a well-balanced, healthy diet.   Nutrition II class: Lifestyle Skills:  -Group instruction provided by PowerPoint slides, verbal discussion, and written materials to support subject matter. The instructor gives an explanation and review of label reading, grocery shopping for heart health, heart healthy recipe modifications, and ways to make healthier choices when eating out.   Diabetes Question & Answer:  -Group instruction provided by PowerPoint slides, verbal discussion, and written materials to support subject matter. The instructor gives an explanation and review of diabetes co-morbidities, pre- and post-prandial blood glucose goals, pre-exercise blood glucose goals, signs, symptoms, and treatment of hypoglycemia and hyperglycemia, and foot care basics.   Diabetes Blitz:  -Group instruction provided by PowerPoint slides, verbal  discussion, and written materials to support subject matter. The instructor gives an explanation and review of the physiology behind type 1 and type 2 diabetes, diabetes medications and rational behind using different medications, pre- and post-prandial blood glucose recommendations and Hemoglobin A1c goals, diabetes diet, and exercise including blood glucose guidelines for exercising safely.    Portion Distortion:  -Group instruction provided by PowerPoint slides, verbal discussion, written materials, and food models to support subject matter. The instructor gives an explanation of serving size versus portion size, changes in portions sizes over the last 20 years, and what consists of a serving from each food group.   Stress Management:  -Group instruction provided by verbal instruction, video, and  written materials to support subject matter.  Instructors review role of stress in heart disease and how to cope with stress positively.     Exercising on Your Own:  -Group instruction provided by verbal instruction, power point, and written materials to support subject.  Instructors discuss benefits of exercise, components of exercise, frequency and intensity of exercise, and end points for exercise.  Also discuss use of nitroglycerin and activating EMS.  Review options of places to exercise outside of rehab.  Review guidelines for sex with heart disease.   Cardiac Drugs I:  -Group instruction provided by verbal instruction and written materials to support subject.  Instructor reviews cardiac drug classes: antiplatelets, anticoagulants, beta blockers, and statins.  Instructor discusses reasons, side effects, and lifestyle considerations for each drug class.   Cardiac Drugs II:  -Group instruction provided by verbal instruction and written materials to support subject.  Instructor reviews cardiac drug classes: angiotensin converting enzyme inhibitors (ACE-I), angiotensin II receptor blockers (ARBs),  nitrates, and calcium channel blockers.  Instructor discusses reasons, side effects, and lifestyle considerations for each drug class.   Anatomy and Physiology of the Circulatory System:  Group verbal and written instruction and models provide basic cardiac anatomy and physiology, with the coronary electrical and arterial systems. Review of: AMI, Angina, Valve disease, Heart Failure, Peripheral Artery Disease, Cardiac Arrhythmia, Pacemakers, and the ICD.   Other Education:  -Group or individual verbal, written, or video instructions that support the educational goals of the cardiac rehab program.   Holiday Eating Survival Tips:  -Group instruction provided by PowerPoint slides, verbal discussion, and written materials to support subject matter. The instructor gives patients tips, tricks, and techniques to help them not only survive but enjoy the holidays despite the onslaught of food that accompanies the holidays.   Knowledge Questionnaire Score:   Core Components/Risk Factors/Patient Goals at Admission: Personal Goals and Risk Factors at Admission - 03/17/18 1509    Core Components/Risk Factors/Patient Goals on Admission           Weight Management  Yes;Obesity;Weight Maintenance;Weight Loss    Intervention  Weight Management: Develop a combined nutrition and exercise program designed to reach desired caloric intake, while maintaining appropriate intake of nutrient and fiber, sodium and fats, and appropriate energy expenditure required for the weight goal.;Weight Management: Provide education and appropriate resources to help participant work on and attain dietary goals.;Weight Management/Obesity: Establish reasonable short term and long term weight goals.;Obesity: Provide education and appropriate resources to help participant work on and attain dietary goals.    Admit Weight  215 lb 13.3 oz (97.9 kg)    Goal Weight: Short Term  210 lb (95.3 kg)    Goal Weight: Long Term  200 lb (90.7 kg)     Expected Outcomes  Short Term: Continue to assess and modify interventions until short term weight is achieved;Long Term: Adherence to nutrition and physical activity/exercise program aimed toward attainment of established weight goal;Weight Maintenance: Understanding of the daily nutrition guidelines, which includes 25-35% calories from fat, 7% or less cal from saturated fats, less than 227m cholesterol, less than 1.5gm of sodium, & 5 or more servings of fruits and vegetables daily;Weight Loss: Understanding of general recommendations for a balanced deficit meal plan, which promotes 1-2 lb weight loss per week and includes a negative energy balance of [phone] kcal/d;Understanding recommendations for meals to include 15-35% energy as protein, 25-35% energy from fat, 35-60% energy from carbohydrates, less than 2068mof dietary cholesterol, 20-35 gm of total fiber  daily;Understanding of distribution of calorie intake throughout the day with the consumption of 4-5 meals/snacks    Lipids  Yes    Intervention  Provide education and support for participant on nutrition & aerobic/resistive exercise along with prescribed medications to achieve LDL <67m, HDL >466m    Expected Outcomes  Short Term: Participant states understanding of desired cholesterol values and is compliant with medications prescribed. Participant is following exercise prescription and nutrition guidelines.;Long Term: Cholesterol controlled with medications as prescribed, with individualized exercise RX and with personalized nutrition plan. Value goals: LDL < 7073mHDL > 40 mg.           Core Components/Risk Factors/Patient Goals Review:    Core Components/Risk Factors/Patient Goals at Discharge (Final Review):    ITP Comments: ITP Comments    Row Name 03/17/18 1338   ITP Comments  Medical Director- Dr. TraFransico HimD      Comments: Patient attended orientation from 1338 to 1538 to review rules and guidelines for program.  Completed 6 minute walk test, Intitial ITP, and exercise prescription.  VSS. Telemetry-sinus rhythm,  Asymptomatic. JoaAndi HenceN, BSN Cardiac Pulmonary Rehab 03/17/18 3:47 PM

## 2018-03-18 ENCOUNTER — Telehealth (HOSPITAL_COMMUNITY): Payer: Self-pay

## 2018-03-18 NOTE — Telephone Encounter (Signed)
Patient called and stated after talking with his wife that he would like to move from 245 to 945 exc class. Informed Museum/gallery conservator and Verdis Frederickson.

## 2018-03-25 ENCOUNTER — Encounter (HOSPITAL_COMMUNITY): Payer: Self-pay

## 2018-03-25 ENCOUNTER — Ambulatory Visit (HOSPITAL_COMMUNITY): Payer: Medicare Other

## 2018-03-27 ENCOUNTER — Ambulatory Visit (HOSPITAL_COMMUNITY): Payer: Medicare Other

## 2018-03-30 ENCOUNTER — Ambulatory Visit (HOSPITAL_COMMUNITY): Payer: Medicare Other

## 2018-04-01 ENCOUNTER — Encounter (HOSPITAL_COMMUNITY): Payer: Medicare Other

## 2018-04-01 ENCOUNTER — Encounter (HOSPITAL_COMMUNITY)
Admission: RE | Admit: 2018-04-01 | Discharge: 2018-04-01 | Disposition: A | Discharge status: Home / Self Care | Payer: Medicare Other | Source: Ambulatory Visit | Attending: Cardiology | Admitting: Cardiology

## 2018-04-01 ENCOUNTER — Ambulatory Visit (HOSPITAL_COMMUNITY): Payer: Medicare Other

## 2018-04-01 DIAGNOSIS — I2510 Atherosclerotic heart disease of native coronary artery without angina pectoris: Secondary | ICD-10-CM | POA: Diagnosis not present

## 2018-04-01 DIAGNOSIS — Z7902 Long term (current) use of antithrombotics/antiplatelets: Secondary | ICD-10-CM | POA: Insufficient documentation

## 2018-04-01 DIAGNOSIS — E785 Hyperlipidemia, unspecified: Secondary | ICD-10-CM | POA: Insufficient documentation

## 2018-04-01 DIAGNOSIS — Z85828 Personal history of other malignant neoplasm of skin: Secondary | ICD-10-CM | POA: Insufficient documentation

## 2018-04-01 DIAGNOSIS — Z7984 Long term (current) use of oral hypoglycemic drugs: Secondary | ICD-10-CM | POA: Diagnosis not present

## 2018-04-01 DIAGNOSIS — Z87891 Personal history of nicotine dependence: Secondary | ICD-10-CM | POA: Insufficient documentation

## 2018-04-01 DIAGNOSIS — Z955 Presence of coronary angioplasty implant and graft: Secondary | ICD-10-CM | POA: Insufficient documentation

## 2018-04-01 DIAGNOSIS — Z7982 Long term (current) use of aspirin: Secondary | ICD-10-CM | POA: Insufficient documentation

## 2018-04-01 DIAGNOSIS — E119 Type 2 diabetes mellitus without complications: Secondary | ICD-10-CM | POA: Insufficient documentation

## 2018-04-01 DIAGNOSIS — Z79899 Other long term (current) drug therapy: Secondary | ICD-10-CM | POA: Insufficient documentation

## 2018-04-01 DIAGNOSIS — I10 Essential (primary) hypertension: Secondary | ICD-10-CM | POA: Insufficient documentation

## 2018-04-01 LAB — GLUCOSE, CAPILLARY
GLUCOSE-CAPILLARY: 112 mg/dL — AB (ref 70–99)
Glucose-Capillary: 130 mg/dL — ABNORMAL HIGH (ref 70–99)

## 2018-04-01 NOTE — Progress Notes (Signed)
Cardiac Individual Treatment Plan  Patient Details  Name: Gregory Mathews. MRN: [id number] Date of Birth: 05/25/1946 Referring Provider:     CARDIAC REHAB PHASE II ORIENTATION from 03/17/2018 in [place]  Referring Provider  Glenetta Hew MD      Initial Encounter Date:    CARDIAC REHAB PHASE II ORIENTATION from 03/17/2018 in [place]  Date  03/17/18      Visit Diagnosis: Status post coronary artery stent placement  Patient's Home Medications on Admission:  Current Outpatient Medications:  .  aspirin 81 MG tablet, Take 81 mg by mouth every other day. , Disp: , Rfl:  .  blood glucose meter kit and supplies, Use to test blood sugar daily. Dx: E11.9, Disp: 1 each, Rfl: 0 .  clopidogrel (PLAVIX) 75 MG tablet, Take 1 tablet (75 mg total) by mouth daily., Disp: 30 tablet, Rfl: 5 .  glipiZIDE (GLUCOTROL) 5 MG tablet, Take by mouth daily before breakfast., Disp: , Rfl:  .  Lancets MISC, Use to test blood sugar daily. Dx code :E11.9, Disp: 100 each, Rfl: 3 .  losartan (COZAAR) 25 MG tablet, TAKE 1 TABLET BY MOUTH EVERY DAY, Disp: 90 tablet, Rfl: 1 .  metoprolol succinate (TOPROL-XL) 25 MG 24 hr tablet, TAKE 1 TABLET BY MOUTH EVERY DAY, Disp: 30 tablet, Rfl: 10 .  Multiple Vitamin (MULTIVITAMIN) tablet, Take 1 tablet by mouth daily., Disp: , Rfl:  .  nitroGLYCERIN (NITROSTAT) 0.4 MG SL tablet, Place 1 tablet (0.4 mg total) under the tongue every 5 (five) minutes x 3 doses as needed for chest pain., Disp: 25 tablet, Rfl: 3 .  ONE TOUCH ULTRA TEST test strip, TEST BLOOD SUGAR DAILY, Disp: 100 each, Rfl: 2 .  sitaGLIPtin (JANUVIA) 100 MG tablet, Take 1 tablet (100 mg total) by mouth daily. (Patient not taking: Reported on 03/13/2018), Disp: 90 tablet, Rfl: 1  Past Medical History: Past Medical History:  Diagnosis Date  . Allergy   . Basal cell carcinoma 01/2013   Nasal and R forearm.  [facility]  . CAD S/P  percutaneous coronary angioplasty    2/19 PCI/DESx1 to mLAD, FFR 0.7 -Sierra DES 2.75 mm x 18 mm  . Cataract   . Diabetes mellitus without complication (HCC)    on PO Meds  . Essential hypertension   . Hyperlipidemia    statin intolerant -- myalgias, fatigue (tried at least 4)  . Rosacea     Tobacco Use: Social History   Tobacco Use  Smoking Status Former Smoker  . Packs/day: 1.00  . Years: 15.00  . Pack years: 15.00  . Last attempt to quit: 07/09/1982  . Years since quitting: 35.7  Smokeless Tobacco Never Used    Labs: Recent Chemical engineer    Labs for ITP Cardiac and Pulmonary Rehab Latest Ref Rng & Units 08/14/2016 11/29/2016 05/07/2017 08/22/2017 03/11/2018   Cholestrol 100 - 199 mg/dL 174 - 183 168 193   LDLCALC 0 - 99 mg/dL 113(H) - 121(H) 106(H) 125(H)   HDL >39 mg/dL 37(L) - 39(L) 38(L) 37(L)   Trlycerides 0 - 149 mg/dL 120 - 114 118 156(H)   Hemoglobin A1c 4.0 - 5.6 % 6.6(H) 7.4(H) 7.1 7.1(H) 7.3(A)      Capillary Blood Glucose: Lab Results  Component Value Date   GLUCAP 132 (H) 04/03/2018   GLUCAP 169 (H) 04/03/2018   GLUCAP 112 (H) 04/01/2018   GLUCAP 130 (H) 04/01/2018   GLUCAP 142 (H)  11/12/2017     Exercise Target Goals:    Exercise Program Goal: Individual exercise prescription set using results from initial 6 min walk test and THRR while considering  patient's activity barriers and safety.   Exercise Prescription Goal: Initial exercise prescription builds to 30-45 minutes a day of aerobic activity, 2-3 days per week.  Home exercise guidelines will be given to patient during program as part of exercise prescription that the participant will acknowledge.  Activity Barriers & Risk Stratification: Activity Barriers & Cardiac Risk Stratification - 03/17/18 1511      Activity Barriers & Cardiac Risk Stratification   Activity Barriers  Deconditioning;Muscular Weakness    Cardiac Risk Stratification  High       6 Minute Walk: 6 Minute Walk     Row Name 03/17/18 1459 03/17/18 1556       6 Minute Walk   Phase  Initial  -    Distance  1919 feet  -    Walk Time  6 minutes  -    # of Rest Breaks  0  -    MPH  3.6  -    METS  3.7  -    RPE  10  -    Symptoms  No  -    Resting HR  65 bpm  -    Resting BP  120/70  -    Resting Oxygen Saturation   99 %  -    Exercise Oxygen Saturation  during 6 min walk  98 %  -    Max Ex. HR  91 bpm  -    Max Ex. BP  150/70  -    2 Minute Post BP  -  130/70       Oxygen Initial Assessment:   Oxygen Re-Evaluation:   Oxygen Discharge (Final Oxygen Re-Evaluation):   Initial Exercise Prescription: Initial Exercise Prescription - 03/17/18 1500      Date of Initial Exercise RX and Referring Provider   Date  03/17/18    Referring Provider  Glenetta Hew MD      Treadmill   MPH  3    Grade  0    Minutes  10    METs  3.3      Bike   Level  1.2    Minutes  10    METs  3.4      NuStep   Level  4    SPM  80    Minutes  10    METs  3      Prescription Details   Frequency (times per week)  3    Duration  Progress to 30 minutes of continuous aerobic without signs/symptoms of physical distress      Intensity   THRR 40-80% of Max Heartrate  60-120    Ratings of Perceived Exertion  11-15    Perceived Dyspnea  0-4      Progression   Progression  Continue to progress workloads to maintain intensity without signs/symptoms of physical distress.      Resistance Training   Training Prescription  Yes    Weight  4lbs    Reps  10-15       Perform Capillary Blood Glucose checks as needed.  Exercise Prescription Changes:  Exercise Prescription Changes    Row Name 04/03/18 0900 04/03/18 0949           Response to Exercise   Blood Pressure (Admit)  132/68  128/70 Previous  column from 04/01/18 0951      Blood Pressure (Exercise)  144/64  166/72      Blood Pressure (Exit)  118/60  122/62      Heart Rate (Admit)  62 bpm  85 bpm      Heart Rate (Exercise)  120 bpm  111 bpm       Heart Rate (Exit)  70 bpm  69 bpm      Rating of Perceived Exertion (Exercise)  11  12      Duration  Progress to 30 minutes of  aerobic without signs/symptoms of physical distress  Progress to 30 minutes of  aerobic without signs/symptoms of physical distress      Intensity  THRR unchanged  THRR unchanged        Progression   Progression  Continue to progress workloads to maintain intensity without signs/symptoms of physical distress.  Continue to progress workloads to maintain intensity without signs/symptoms of physical distress.      Average METs  3  3        Resistance Training   Training Prescription  No Relaxation day, no weights.  Yes      Weight  -  4lbs      Reps  -  10-15      Time  -  10 Minutes        Interval Training   Interval Training  No  No        Treadmill   MPH  3  3      Grade  0  0      Minutes  10  10      METs  3.3  3.3        Bike   Level  1.2  1.2      Minutes  10  10      METs  3.31  3.29        NuStep   Level  4  4      SPM  80  80      Minutes  10  10      METs  2.4  2.4         Exercise Comments:  Exercise Comments    Row Name 04/03/18 1045           Exercise Comments  Patient tolerated 1st session of exercise well without c/o.          Exercise Goals and Review:  Exercise Goals    Row Name 03/17/18 1507             Exercise Goals   Increase Physical Activity  Yes       Intervention  Provide advice, education, support and counseling about physical activity/exercise needs.;Develop an individualized exercise prescription for aerobic and resistive training based on initial evaluation findings, risk stratification, comorbidities and participant's personal goals.       Expected Outcomes  Short Term: Attend rehab on a regular basis to increase amount of physical activity.;Long Term: Add in home exercise to make exercise part of routine and to increase amount of physical activity.;Long Term: Exercising regularly at least 3-5  days a week.       Increase Strength and Stamina  Yes       Intervention  Provide advice, education, support and counseling about physical activity/exercise needs.;Develop an individualized exercise prescription for aerobic and resistive training based on initial evaluation findings, risk stratification, comorbidities and participant's personal goals.  Expected Outcomes  Short Term: Increase workloads from initial exercise prescription for resistance, speed, and METs.;Short Term: Perform resistance training exercises routinely during rehab and add in resistance training at home;Long Term: Improve cardiorespiratory fitness, muscular endurance and strength as measured by increased METs and functional capacity (6MWT)       Able to understand and use rate of perceived exertion (RPE) scale  Yes       Intervention  Provide education and explanation on how to use RPE scale       Expected Outcomes  Short Term: Able to use RPE daily in rehab to express subjective intensity level;Long Term:  Able to use RPE to guide intensity level when exercising independently       Knowledge and understanding of Target Heart Rate Range (THRR)  Yes       Intervention  Provide education and explanation of THRR including how the numbers were predicted and where they are located for reference       Expected Outcomes  Short Term: Able to state/look up THRR;Long Term: Able to use THRR to govern intensity when exercising independently;Short Term: Able to use daily as guideline for intensity in rehab       Able to check pulse independently  Yes       Intervention  Provide education and demonstration on how to check pulse in carotid and radial arteries.;Review the importance of being able to check your own pulse for safety during independent exercise       Expected Outcomes  Short Term: Able to explain why pulse checking is important during independent exercise;Long Term: Able to check pulse independently and accurately        Understanding of Exercise Prescription  Yes       Intervention  Provide education, explanation, and written materials on patient's individual exercise prescription       Expected Outcomes  Short Term: Able to explain program exercise prescription;Long Term: Able to explain home exercise prescription to exercise independently          Exercise Goals Re-Evaluation : Exercise Goals Re-Evaluation    Row Name 04/03/18 1045             Exercise Goal Re-Evaluation   Exercise Goals Review  Able to understand and use rate of perceived exertion (RPE) scale;Increase Physical Activity       Comments  Patient able to understand and use RPE scale appropriately.       Expected Outcomes  Increase workloads as tolerated.           Discharge Exercise Prescription (Final Exercise Prescription Changes): Exercise Prescription Changes - 04/03/18 0949      Response to Exercise   Blood Pressure (Admit)  128/70 Previous column from 04/01/18 0951    Blood Pressure (Exercise)  166/72    Blood Pressure (Exit)  122/62    Heart Rate (Admit)  85 bpm    Heart Rate (Exercise)  111 bpm    Heart Rate (Exit)  69 bpm    Rating of Perceived Exertion (Exercise)  12    Duration  Progress to 30 minutes of  aerobic without signs/symptoms of physical distress    Intensity  THRR unchanged      Progression   Progression  Continue to progress workloads to maintain intensity without signs/symptoms of physical distress.    Average METs  3      Resistance Training   Training Prescription  Yes    Weight  4lbs  Reps  10-15    Time  10 Minutes      Interval Training   Interval Training  No      Treadmill   MPH  3    Grade  0    Minutes  10    METs  3.3      Bike   Level  1.2    Minutes  10    METs  3.29      NuStep   Level  4    SPM  80    Minutes  10    METs  2.4       Nutrition:  Target Goals: Understanding of nutrition guidelines, daily intake of sodium <1545m, cholesterol <2039m calories 30%  from fat and 7% or less from saturated fats, daily to have 5 or more servings of fruits and vegetables.  Biometrics: Pre Biometrics - 03/17/18 1508      Pre Biometrics   Height  _0  (1.727 m)    Weight  215 lb 13.3 oz (97.9 kg)    Waist Circumference  43.5 inches    Hip Circumference  41 inches    Waist to Hip Ratio  1.06 %    BMI (Calculated)  32.82    Triceps Skinfold  20 mm    % Body Fat  32.4 %    Grip Strength  45 kg    Flexibility  9 in    Single Leg Stand  8.03 seconds        Nutrition Therapy Plan and Nutrition Goals: Nutrition Therapy & Goals - 04/03/18 1059      Nutrition Therapy   Diet  heart healthy      Personal Nutrition Goals   Nutrition Goal  Pt to identify food quantities necessary to achieve weight loss of 6-15 lbs. at graduation from cardiac rehab.    Personal Goal #2  Pt to identify and limit food sources of saturated fat, trans fat, and sodium      Intervention Plan   Intervention  Prescribe, educate and counsel regarding individualized specific dietary modifications aiming towards targeted core components such as weight, hypertension, lipid management, diabetes, heart failure and other comorbidities.    Expected Outcomes  Short Term Goal: Understand basic principles of dietary content, such as calories, fat, sodium, cholesterol and nutrients.       Nutrition Assessments: Nutrition Assessments - 04/03/18 1059      MEDFICTS Scores   Pre Score  18       Nutrition Goals Re-Evaluation:   Nutrition Goals Re-Evaluation:   Nutrition Goals Discharge (Final Nutrition Goals Re-Evaluation):   Psychosocial: Target Goals: Acknowledge presence or absence of significant depression and/or stress, maximize coping skills, provide positive support system. Participant is able to verbalize types and ability to use techniques and skills needed for reducing stress and depression.  Initial Review & Psychosocial Screening: Initial Psych Review & Screening -  03/17/18 1544      Initial Review   Current issues with  None Identified      Family Dynamics   Good Support System?  No      Barriers   Psychosocial barriers to participate in program  There are no identifiable barriers or psychosocial needs.      Screening Interventions   Interventions  Encouraged to exercise       Quality of Life Scores: Quality of Life - 03/17/18 1556      Quality of Life   Select  Quality of  Life      Scores of 19 and below usually indicate a poorer quality of life in these areas.  A difference of  2-3 points is a clinically meaningful difference.  A difference of 2-3 points in the total score of the Quality of Life Index has been associated with significant improvement in overall quality of life, self-image, physical symptoms, and general health in studies assessing change in quality of life.  PHQ-9: Recent Review Flowsheet Data    Depression screen [facility] 2/9 04/01/2018 03/11/2018 11/27/2017 08/22/2017 05/07/2017   Decreased Interest 0 0 0 - 0   Down, Depressed, Hopeless 0 0 0 0 0   PHQ - 2 Score 0 0 0 0 0     Interpretation of Total Score  Total Score Depression Severity:  1-4 = Minimal depression, 5-9 = Mild depression, 10-14 = Moderate depression, 15-19 = Moderately severe depression, 20-27 = Severe depression   Psychosocial Evaluation and Intervention:   Psychosocial Re-Evaluation:   Psychosocial Discharge (Final Psychosocial Re-Evaluation):   Vocational Rehabilitation: Provide vocational rehab assistance to qualifying candidates.   Vocational Rehab Evaluation & Intervention:   Education: Education Goals: Education classes will be provided on a weekly basis, covering required topics. Participant will state understanding/return demonstration of topics presented.  Learning Barriers/Preferences: Learning Barriers/Preferences - 03/17/18 1459      Learning Barriers/Preferences   Learning Barriers  Sight    Learning Preferences  Written  Material;Video;Verbal Instruction;Skilled Demonstration;Pictoral       Education Topics: Count Your Pulse:  -Group instruction provided by verbal instruction, demonstration, patient participation and written materials to support subject.  Instructors address importance of being able to find your pulse and how to count your pulse when at home without a heart monitor.  Patients get hands on experience counting their pulse with staff help and individually.   Heart Attack, Angina, and Risk Factor Modification:  -Group instruction provided by verbal instruction, video, and written materials to support subject.  Instructors address signs and symptoms of angina and heart attacks.    Also discuss risk factors for heart disease and how to make changes to improve heart health risk factors.   Functional Fitness:  -Group instruction provided by verbal instruction, demonstration, patient participation, and written materials to support subject.  Instructors address safety measures for doing things around the house.  Discuss how to get up and down off the floor, how to pick things up properly, how to safely get out of a chair without assistance, and balance training.   Meditation and Mindfulness:  -Group instruction provided by verbal instruction, patient participation, and written materials to support subject.  Instructor addresses importance of mindfulness and meditation practice to help reduce stress and improve awareness.  Instructor also leads participants through a meditation exercise.    Stretching for Flexibility and Mobility:  -Group instruction provided by verbal instruction, patient participation, and written materials to support subject.  Instructors lead participants through series of stretches that are designed to increase flexibility thus improving mobility.  These stretches are additional exercise for major muscle groups that are typically performed during regular warm up and cool  down.   Hands Only CPR:  -Group verbal, video, and participation provides a basic overview of AHA guidelines for community CPR. Role-play of emergencies allow participants the opportunity to practice calling for help and chest compression technique with discussion of AED use.   Hypertension: -Group verbal and written instruction that provides a basic overview of hypertension including the most recent diagnostic guidelines,  risk factor reduction with self-care instructions and medication management.    Nutrition I class: Heart Healthy Eating:  -Group instruction provided by PowerPoint slides, verbal discussion, and written materials to support subject matter. The instructor gives an explanation and review of the Therapeutic Lifestyle Changes diet recommendations, which includes a discussion on lipid goals, dietary fat, sodium, fiber, plant stanol/sterol esters, sugar, and the components of a well-balanced, healthy diet.   Nutrition II class: Lifestyle Skills:  -Group instruction provided by PowerPoint slides, verbal discussion, and written materials to support subject matter. The instructor gives an explanation and review of label reading, grocery shopping for heart health, heart healthy recipe modifications, and ways to make healthier choices when eating out.   Diabetes Question & Answer:  -Group instruction provided by PowerPoint slides, verbal discussion, and written materials to support subject matter. The instructor gives an explanation and review of diabetes co-morbidities, pre- and post-prandial blood glucose goals, pre-exercise blood glucose goals, signs, symptoms, and treatment of hypoglycemia and hyperglycemia, and foot care basics.   Diabetes Blitz:  -Group instruction provided by PowerPoint slides, verbal discussion, and written materials to support subject matter. The instructor gives an explanation and review of the physiology behind type 1 and type 2 diabetes, diabetes  medications and rational behind using different medications, pre- and post-prandial blood glucose recommendations and Hemoglobin A1c goals, diabetes diet, and exercise including blood glucose guidelines for exercising safely.    Portion Distortion:  -Group instruction provided by PowerPoint slides, verbal discussion, written materials, and food models to support subject matter. The instructor gives an explanation of serving size versus portion size, changes in portions sizes over the last 20 years, and what consists of a serving from each food group.   Stress Management:  -Group instruction provided by verbal instruction, video, and written materials to support subject matter.  Instructors review role of stress in heart disease and how to cope with stress positively.     Exercising on Your Own:  -Group instruction provided by verbal instruction, power point, and written materials to support subject.  Instructors discuss benefits of exercise, components of exercise, frequency and intensity of exercise, and end points for exercise.  Also discuss use of nitroglycerin and activating EMS.  Review options of places to exercise outside of rehab.  Review guidelines for sex with heart disease.   Cardiac Drugs I:  -Group instruction provided by verbal instruction and written materials to support subject.  Instructor reviews cardiac drug classes: antiplatelets, anticoagulants, beta blockers, and statins.  Instructor discusses reasons, side effects, and lifestyle considerations for each drug class.   Cardiac Drugs II:  -Group instruction provided by verbal instruction and written materials to support subject.  Instructor reviews cardiac drug classes: angiotensin converting enzyme inhibitors (ACE-I), angiotensin II receptor blockers (ARBs), nitrates, and calcium channel blockers.  Instructor discusses reasons, side effects, and lifestyle considerations for each drug class.   Anatomy and Physiology of the  Circulatory System:  Group verbal and written instruction and models provide basic cardiac anatomy and physiology, with the coronary electrical and arterial systems. Review of: AMI, Angina, Valve disease, Heart Failure, Peripheral Artery Disease, Cardiac Arrhythmia, Pacemakers, and the ICD.   Other Education:  -Group or individual verbal, written, or video instructions that support the educational goals of the cardiac rehab program.   Holiday Eating Survival Tips:  -Group instruction provided by PowerPoint slides, verbal discussion, and written materials to support subject matter. The instructor gives patients tips, tricks, and techniques to help them not  only survive but enjoy the holidays despite the onslaught of food that accompanies the holidays.   Knowledge Questionnaire Score:   Core Components/Risk Factors/Patient Goals at Admission: Personal Goals and Risk Factors at Admission - 03/17/18 1509      Core Components/Risk Factors/Patient Goals on Admission    Weight Management  Yes;Obesity;Weight Maintenance;Weight Loss    Intervention  Weight Management: Develop a combined nutrition and exercise program designed to reach desired caloric intake, while maintaining appropriate intake of nutrient and fiber, sodium and fats, and appropriate energy expenditure required for the weight goal.;Weight Management: Provide education and appropriate resources to help participant work on and attain dietary goals.;Weight Management/Obesity: Establish reasonable short term and long term weight goals.;Obesity: Provide education and appropriate resources to help participant work on and attain dietary goals.    Admit Weight  215 lb 13.3 oz (97.9 kg)    Goal Weight: Short Term  210 lb (95.3 kg)    Goal Weight: Long Term  200 lb (90.7 kg)    Expected Outcomes  Short Term: Continue to assess and modify interventions until short term weight is achieved;Long Term: Adherence to nutrition and physical  activity/exercise program aimed toward attainment of established weight goal;Weight Maintenance: Understanding of the daily nutrition guidelines, which includes 25-35% calories from fat, 7% or less cal from saturated fats, less than 260m cholesterol, less than 1.5gm of sodium, & 5 or more servings of fruits and vegetables daily;Weight Loss: Understanding of general recommendations for a balanced deficit meal plan, which promotes 1-2 lb weight loss per week and includes a negative energy balance of [phone] kcal/d;Understanding recommendations for meals to include 15-35% energy as protein, 25-35% energy from fat, 35-60% energy from carbohydrates, less than 2031mof dietary cholesterol, 20-35 gm of total fiber daily;Understanding of distribution of calorie intake throughout the day with the consumption of 4-5 meals/snacks    Lipids  Yes    Intervention  Provide education and support for participant on nutrition & aerobic/resistive exercise along with prescribed medications to achieve LDL <7032mHDL >54m3m  Expected Outcomes  Short Term: Participant states understanding of desired cholesterol values and is compliant with medications prescribed. Participant is following exercise prescription and nutrition guidelines.;Long Term: Cholesterol controlled with medications as prescribed, with individualized exercise RX and with personalized nutrition plan. Value goals: LDL < 70mg14mL > 40 mg.       Core Components/Risk Factors/Patient Goals Review:    Core Components/Risk Factors/Patient Goals at Discharge (Final Review):    ITP Comments: ITP Comments    Row Name 03/17/18 1338           ITP Comments  Medical Director- Dr. TraciFransico Him         Comments: Pt started cardiac rehab today.  Pt tolerated light exercise without difficulty. VSS, telemetry-Sinus Rhythm, asymptomatic.  Medication list reconciled. Pt denies barriers to medicaiton compliance.  PSYCHOSOCIAL ASSESSMENT:  PHQ-0. Pt exhibits  positive coping skills, hopeful outlook with supportive family. No psychosocial needs identified at this time, no psychosocial interventions necessary.    Pt enjoys reading.   Pt oriented to exercise equipment and routine.    Understanding verbalized.MariaBarnet PallBSN 04/03/2018 11:49 AM

## 2018-04-03 ENCOUNTER — Encounter (HOSPITAL_COMMUNITY)
Admission: RE | Admit: 2018-04-03 | Discharge: 2018-04-03 | Disposition: A | Discharge status: Home / Self Care | Payer: Medicare Other | Source: Ambulatory Visit | Attending: Cardiology | Admitting: Cardiology

## 2018-04-03 ENCOUNTER — Ambulatory Visit (HOSPITAL_COMMUNITY): Payer: Medicare Other

## 2018-04-03 ENCOUNTER — Encounter (HOSPITAL_COMMUNITY): Payer: Medicare Other

## 2018-04-03 VITALS — Ht 68.0 in | Wt 219.6 lb

## 2018-04-03 DIAGNOSIS — Z955 Presence of coronary angioplasty implant and graft: Secondary | ICD-10-CM | POA: Diagnosis not present

## 2018-04-03 LAB — GLUCOSE, CAPILLARY
GLUCOSE-CAPILLARY: 169 mg/dL — AB (ref 70–99)
GLUCOSE-CAPILLARY: 132 mg/dL — AB (ref 70–99)

## 2018-04-03 NOTE — Progress Notes (Signed)
Gregory Mathews. 72 y.o. male DOB: July 29, 1946 MRN: [id number]      Nutrition Note  1. Status post coronary artery stent placement    Past Medical History:  Diagnosis Date  . Allergy   . Basal cell carcinoma 01/2013   Nasal and R forearm.  [facility]  . CAD S/P percutaneous coronary angioplasty    2/19 PCI/DESx1 to mLAD, FFR 0.7 -Sierra DES 2.75 mm x 18 mm  . Cataract   . Diabetes mellitus without complication (HCC)    on PO Meds  . Essential hypertension   . Hyperlipidemia    statin intolerant -- myalgias, fatigue (tried at least 4)  . Rosacea    Meds reviewed. Losartan, glipizide, toprol, januvia noted  HT: Ht Readings from Last 1 Encounters:  03/17/18 5\' 8"  (1.727 m)    WT: Wt Readings from Last 5 Encounters:  03/17/18 215 lb 13.3 oz (97.9 kg)  03/11/18 215 lb 9.6 oz (97.8 kg)  11/27/17 215 lb 3.2 oz (97.6 kg)  11/20/17 216 lb (98 kg)  11/12/17 215 lb (97.5 kg)     There is no height or weight on file to calculate BMI.   Current tobacco use? No      Labs:  Lipid Panel     Component Value Date/Time   CHOL 193 03/11/2018 0939   TRIG 156 (H) 03/11/2018 0939   HDL 37 (L) 03/11/2018 0939   CHOLHDL 5.2 (H) 03/11/2018 0939   CHOLHDL 4.7 08/14/2016 0846   VLDL 24 08/14/2016 0846   LDLCALC 125 (H) 03/11/2018 0939    Lab Results  Component Value Date   HGBA1C 7.3 (A) 03/11/2018   CBG (last 3)  Recent Labs    04/01/18 1042 04/03/18 0954 04/03/18 1046  GLUCAP 112* 169* 132*    Nutrition Note Spoke with pt. Nutrition plan and goals reviewed with pt. Pt is following Step 2 of the Therapeutic Lifestyle Changes diet. Pt wants to lose wt. Pt has been trying to lose wt by eating less shared he only eats 2 meals a day, no snacks. Wt loss tips reviewed. Recommended eating more frequently across the day to help manage blood glucose and to help aid in weight loss. Pt shared that he avoids carbohydrates at breakfast, discussed that a healthy and balanced meal  includes carbohydrates. Recommended pt incorporated complex carbohydrates as part of breakfast meal. Educated pt on what foods are complex carbohydrates. Created a list with patient of complex carbohydrates to try with breakfast meal. Pt is diabetic. Last A1c indicates blood glucose not optimally  controlled. This Probation officer went over Diabetes Education test results. Pt checks CBG's 1-2 times a day. Fasting CBG's reportedly 125-135 mg/dL. Pt expressed understanding of the information reviewed. Pt aware of nutrition education classes offered and plans on attending nutrition classes.  Nutrition Diagnosis ? Food-and nutrition-related knowledge deficit related to lack of exposure to information as related to diagnosis of: ? CVD  ? Obesity related to excessive energy intake as evidenced by a Body mass index is 33.39 kg/m.   Nutrition Intervention ? Pt's individual nutrition plan and goals reviewed with pt. ? Pt given handouts for: ? Nutrition I class ? Nutrition II class  ? Diabetes Blitz Class ? Diabetes Q & A class  ? Consistent vit K diet ? low sodium ? DM ? pre-diabetes  Nutrition Goal(s):   ? Pt to identify and limit food sources of saturated fat, trans fat, and sodium ? Pt to identify food quantities necessary to  achieve weight loss of 6-15 lbs. at graduation from cardiac rehab. ? Pt to eat complex carbohydrate with breakfast.   Plan:  ? Pt to attend nutrition classes ? Nutrition I ? Nutrition II ? Portion Distortion  ? Diabetes Blitz ? Diabetes Q & Ae determined ? Will provide client-centered nutrition education as part of interdisciplinary care ? Monitor and evaluate progress toward nutrition goal with team.   Laurina Bustle, MS, RD, LDN 04/03/2018 10:50 AM

## 2018-04-06 ENCOUNTER — Encounter (HOSPITAL_COMMUNITY): Payer: Medicare Other

## 2018-04-06 ENCOUNTER — Encounter (HOSPITAL_COMMUNITY)
Admission: RE | Admit: 2018-04-06 | Discharge: 2018-04-06 | Disposition: A | Discharge status: Home / Self Care | Payer: Medicare Other | Source: Ambulatory Visit | Attending: Cardiology | Admitting: Cardiology

## 2018-04-06 ENCOUNTER — Ambulatory Visit (HOSPITAL_COMMUNITY): Payer: Medicare Other

## 2018-04-06 DIAGNOSIS — Z955 Presence of coronary angioplasty implant and graft: Secondary | ICD-10-CM

## 2018-04-06 LAB — GLUCOSE, CAPILLARY
GLUCOSE-CAPILLARY: 126 mg/dL — AB (ref 70–99)
Glucose-Capillary: 123 mg/dL — ABNORMAL HIGH (ref 70–99)

## 2018-04-08 ENCOUNTER — Ambulatory Visit (HOSPITAL_COMMUNITY): Payer: Medicare Other

## 2018-04-08 ENCOUNTER — Encounter (HOSPITAL_COMMUNITY): Payer: Medicare Other

## 2018-04-10 ENCOUNTER — Encounter (HOSPITAL_COMMUNITY)
Admission: RE | Admit: 2018-04-10 | Discharge: 2018-04-10 | Disposition: A | Discharge status: Home / Self Care | Payer: Medicare Other | Source: Ambulatory Visit | Attending: Cardiology | Admitting: Cardiology

## 2018-04-10 ENCOUNTER — Ambulatory Visit (HOSPITAL_COMMUNITY): Payer: Medicare Other

## 2018-04-10 ENCOUNTER — Encounter (HOSPITAL_COMMUNITY): Payer: Medicare Other

## 2018-04-10 DIAGNOSIS — Z955 Presence of coronary angioplasty implant and graft: Secondary | ICD-10-CM

## 2018-04-13 ENCOUNTER — Ambulatory Visit (HOSPITAL_COMMUNITY): Payer: Medicare Other

## 2018-04-13 ENCOUNTER — Encounter (HOSPITAL_COMMUNITY)
Admission: RE | Admit: 2018-04-13 | Discharge: 2018-04-13 | Disposition: A | Discharge status: Home / Self Care | Payer: Medicare Other | Source: Ambulatory Visit

## 2018-04-13 ENCOUNTER — Encounter (HOSPITAL_COMMUNITY)
Admission: RE | Admit: 2018-04-13 | Discharge: 2018-04-13 | Disposition: A | Discharge status: Home / Self Care | Payer: Medicare Other | Source: Ambulatory Visit | Attending: Cardiology | Admitting: Cardiology

## 2018-04-13 ENCOUNTER — Encounter (HOSPITAL_COMMUNITY): Payer: Medicare Other

## 2018-04-13 DIAGNOSIS — Z955 Presence of coronary angioplasty implant and graft: Secondary | ICD-10-CM

## 2018-04-13 NOTE — Progress Notes (Signed)
Gregory Mathews. 72 y.o. male Nutrition Note Spoke with pt. Nutrition plan and goals reviewed with pt. Pt is following Step 2 of the Therapeutic Lifestyle Changes diet. Pt wants to lose wt. Pt has been trying to lose wt by eating less shared he only eats 2 meals a day, reports "I don't do snacks". Wt loss tips reviewed. Recommended eating more frequently across the day to help manage blood glucose and to help aid in weight loss. Pt shared that he avoids carbohydrates at breakfast, reinforced that a healthy and balanced meal includes carbohydrates. Again recommended pt incorporated complex carbohydrates as part of breakfast meal. Reeducated pt on what foods are complex carbohydrates. Reviewed a list with patient of complex carbohydrates to try with breakfast meal. Pt is diabetic, last A1c indicates blood glucose not optimally  controlled. This Probation officer went over Diabetes Education test results. Pt checks CBG's 1-2 times a day. Fasting CBG's reportedly 125-135 mg/dL. Pt expressed understanding of the information reviewed. Pt aware of nutrition education classes offered and plans on attending nutrition classes.    Lab Results  Component Value Date   HGBA1C 7.3 (A) 03/11/2018    Wt Readings from Last 3 Encounters:  04/03/18 219 lb 9.3 oz (99.6 kg)  03/17/18 215 lb 13.3 oz (97.9 kg)  03/11/18 215 lb 9.6 oz (97.8 kg)    Nutrition Diagnosis  Food-and nutrition-related knowledge deficit related to lack of exposure to information as related to diagnosis of: ? CVD   Obesity related to excessive energy intake as evidenced by a Body mass index is 33.39 kg/m.    Nutrition Intervention ? Pt's individual nutrition plan reviewed with pt. ? Benefits of adopting Heart Healthy diet discussed when Medficts reviewed.     Goal(s) ? Pt to describe the potential benefits of adopting a Heart Healthy diet.  ? Pt to identify and limit food sources of saturated fat, trans fat, and sodium ? Pt to identify food  quantities necessary to achieve weight loss of 6-15 lb at graduation from cardiac rehab.   Plan:  Pt to attend nutrition classes ? Nutrition I ? Nutrition II ? Portion Distortion  Will provide client-centered nutrition education as part of interdisciplinary care.   Monitor and evaluate progress toward nutrition goal with team.   Laurina Bustle, MS, RD, LDN 04/13/2018 10:53 AM

## 2018-04-13 NOTE — Progress Notes (Signed)
Reviewed home exercise guidelines with patient including endpoints, temperature precautions, target heart rate and rate of perceived exertion. Pt is walking 45 minutes daily as his mode of home exercise. Pt also uses hand weights at home. Pt voices understanding of instructions given. Sol Passer, MS, ACSM CEP

## 2018-04-15 ENCOUNTER — Ambulatory Visit (HOSPITAL_COMMUNITY): Payer: Medicare Other

## 2018-04-15 ENCOUNTER — Encounter (HOSPITAL_COMMUNITY)
Admission: RE | Admit: 2018-04-15 | Discharge: 2018-04-15 | Disposition: A | Discharge status: Home / Self Care | Payer: Medicare Other | Source: Ambulatory Visit | Attending: Cardiology | Admitting: Cardiology

## 2018-04-15 ENCOUNTER — Encounter (HOSPITAL_COMMUNITY): Payer: Medicare Other

## 2018-04-15 DIAGNOSIS — Z955 Presence of coronary angioplasty implant and graft: Secondary | ICD-10-CM

## 2018-04-17 ENCOUNTER — Encounter (HOSPITAL_COMMUNITY)
Admission: RE | Admit: 2018-04-17 | Discharge: 2018-04-17 | Disposition: A | Discharge status: Home / Self Care | Payer: Medicare Other | Source: Ambulatory Visit | Attending: Cardiology | Admitting: Cardiology

## 2018-04-17 ENCOUNTER — Encounter (HOSPITAL_COMMUNITY): Payer: Medicare Other

## 2018-04-17 ENCOUNTER — Telehealth: Payer: Self-pay | Admitting: Cardiology

## 2018-04-17 ENCOUNTER — Ambulatory Visit (HOSPITAL_COMMUNITY): Payer: Medicare Other

## 2018-04-17 DIAGNOSIS — Z955 Presence of coronary angioplasty implant and graft: Secondary | ICD-10-CM | POA: Diagnosis not present

## 2018-04-17 NOTE — Telephone Encounter (Signed)
Routed to primary cardiologist  Cath/stent Feb 2019

## 2018-04-17 NOTE — Telephone Encounter (Signed)
New message  Pt  wants to know if it ok for him to lift weights and resistance training. Please advise.

## 2018-04-19 NOTE — Telephone Encounter (Signed)
Yes -- should be fine.  Glenetta Hew, MD

## 2018-04-20 ENCOUNTER — Ambulatory Visit (HOSPITAL_COMMUNITY): Payer: Medicare Other

## 2018-04-20 ENCOUNTER — Encounter (HOSPITAL_COMMUNITY): Payer: Medicare Other

## 2018-04-20 ENCOUNTER — Other Ambulatory Visit: Payer: Self-pay

## 2018-04-20 ENCOUNTER — Encounter: Payer: Self-pay | Admitting: Urgent Care

## 2018-04-20 ENCOUNTER — Ambulatory Visit: Payer: Self-pay | Admitting: Urgent Care

## 2018-04-20 VITALS — BP 157/73 | HR 79 | Temp 98.2°F | Resp 18 | Ht 68.0 in | Wt 221.4 lb

## 2018-04-20 DIAGNOSIS — Z024 Encounter for examination for driving license: Secondary | ICD-10-CM

## 2018-04-20 DIAGNOSIS — I10 Essential (primary) hypertension: Secondary | ICD-10-CM

## 2018-04-20 DIAGNOSIS — I2510 Atherosclerotic heart disease of native coronary artery without angina pectoris: Secondary | ICD-10-CM

## 2018-04-20 DIAGNOSIS — E7800 Pure hypercholesterolemia, unspecified: Secondary | ICD-10-CM

## 2018-04-20 DIAGNOSIS — Z955 Presence of coronary angioplasty implant and graft: Secondary | ICD-10-CM

## 2018-04-20 DIAGNOSIS — E119 Type 2 diabetes mellitus without complications: Secondary | ICD-10-CM

## 2018-04-20 DIAGNOSIS — R030 Elevated blood-pressure reading, without diagnosis of hypertension: Secondary | ICD-10-CM

## 2018-04-20 NOTE — Progress Notes (Signed)
Commercial Driver Medical Examination   Gregory Mathews. is a 71 y.o. male who presents today for a DOT physical exam. The patient reports that he had stent placement back in February 2019.  At the time patient received letter of clearance from his cardiologist.  Today, he reports that he is maintained follow-up with his cardiologist.  However, he has not had an exercise tolerance test as required by the DOT 3 to 6 months status post PCI placement.  He is due to follow-up and has scheduled an appointment with his cardiologist.  Regarding his hypertension, patient reports that it has generally elevated, reports compliance with this medication.  Denies dizziness, headaches, chest pain, heart racing, palpitations, shortness of breath, nausea, vomiting, belly pain, diaphoresis, hematuria, lower leg swelling.  He does have a history of diabetes, last office visit was 03/12/2018 and had an A1c of 7.3%, last blood sugar check was about 2 weeks ago and was at 123 mg/dL.  He reports that he quit smoking about 35 years ago. He has a 15.00 pack-year smoking history. He has never used smokeless tobacco. He reports that he does not drink alcohol or use drugs.   The following portions of the patient's history were reviewed and updated as appropriate: allergies, current medications, past family history, past medical history, past social history and past surgical history.  Objective:   BP (!) 157/73   Pulse 79   Temp 98.2 F (36.8 C) (Oral)   Resp 18   Ht 5\' 8"  (1.727 m)   Wt 221 lb 6.4 oz (100.4 kg)   SpO2 95%   BMI 33.66 kg/m   BP Readings from Last 3 Encounters:  04/20/18 (!) 157/73  03/11/18 118/62  12/02/17 134/60    Vision/hearing:  Visual Acuity Screening   Right eye Left eye Both eyes  Without correction: 20/25 20/25-1 20/25  With correction:     Comments: Peripheral Vision: Right eye 70 degrees. Left eye 85 degrees.  The patient can distinguish the colors red, amber and  green.    Hearing Screening Comments: The patient was able to hear a forced whisper from 10 feet.  Patient can recognize and distinguish among traffic control signals and devices showing standard red, green, and amber colors.  Corrective lenses required: No  Monocular Vision?: No  Hearing aid requirement: No  Physical Exam  Constitutional: He is oriented to person, place, and time. He appears well-developed and well-nourished.  HENT:  TM's intact bilaterally, no effusions or erythema. Nasal turbinates pink and moist, nasal passages patent. No sinus tenderness. Oropharynx clear, mucous membranes moist, dentition in good repair.  Eyes: Pupils are equal, round, and reactive to light. Conjunctivae and EOM are normal. Right eye exhibits no discharge. Left eye exhibits no discharge. No scleral icterus.  Neck: Normal range of motion. Neck supple.  Cardiovascular: Normal rate, regular rhythm and intact distal pulses. Exam reveals no gallop and no friction rub.  No murmur heard. Pulmonary/Chest: No stridor. No respiratory distress. He has no wheezes. He has no rales.  Abdominal: Soft. Bowel sounds are normal. He exhibits no distension and no mass. There is no tenderness.  Musculoskeletal: Normal range of motion. He exhibits no edema or tenderness.  Lymphadenopathy:    He has no cervical adenopathy.  Neurological: He is alert and oriented to person, place, and time. He has normal reflexes. He displays normal reflexes. Coordination normal.  Skin: Skin is warm and dry. No rash noted. No erythema. No pallor.  Psychiatric: He has  a normal mood and affect.   Labs: Comments: Urine Specimen:  SpGr:  1.010  Protein:  Negative   Blood:  Negative   Sugar:  500  Assessment:    Healthy male exam.  Meets standards, but periodic monitoring required due to heart disease s/p PCI.  Driver qualified only for 1 month.    Plan:   Medical examiners certificate completed and printed. Return as  needed.  Jaynee Eagles, PA-C [facility] [phone] 04/20/2018  3:06 PM

## 2018-04-20 NOTE — Telephone Encounter (Signed)
Patient called with MD advice. He states [facility] is needing this info. This encounter has been faxed to [phone]

## 2018-04-20 NOTE — Patient Instructions (Addendum)
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  Therefore you are due for this.  Please try to get this done as soon as possible so that we can certify you.  According to these guidelines I can give you a one-month certificate.  If you complete this over the next month and your blood pressure is under control, that is less than 330/07 then we can recertify you for up to 1 year from today's date.    IF you received an x-ray today, you will receive an invoice from [facility]. Please contact [facility] at [phone] with questions or concerns regarding your invoice.   IF you received labwork today, you will receive an invoice from [facility]. Please contact [facility] at [phone] with questions or concerns regarding your invoice.   Our billing staff will not be able to assist you with questions regarding bills from these companies.  You will be contacted with the lab results as soon as they are available. The fastest way to get your results is to activate your My Chart account. Instructions are located on the last page of this paperwork. If you have not heard from [place] regarding the results in 2 weeks, please contact this office.

## 2018-04-21 ENCOUNTER — Telehealth: Payer: Self-pay | Admitting: Cardiology

## 2018-04-21 DIAGNOSIS — I25119 Atherosclerotic heart disease of native coronary artery with unspecified angina pectoris: Secondary | ICD-10-CM

## 2018-04-21 NOTE — Telephone Encounter (Signed)
New Message        Patient would like call back concerning his stress test and appointment please.

## 2018-04-21 NOTE — Telephone Encounter (Signed)
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 Will forward to Dr Ellyn Hack for review

## 2018-04-22 ENCOUNTER — Ambulatory Visit (HOSPITAL_COMMUNITY): Payer: Medicare Other

## 2018-04-22 ENCOUNTER — Encounter (HOSPITAL_COMMUNITY): Payer: Medicare Other

## 2018-04-22 NOTE — Telephone Encounter (Signed)
We can go ahead and schedule him for treadmill GXT test.  Indication is CAD-PCI and work physical.   Glenetta Hew, MD

## 2018-04-22 NOTE — Telephone Encounter (Signed)
I have not seen him since February, so I am not sure of any symptoms.  Glenetta Hew, MD

## 2018-04-22 NOTE — Telephone Encounter (Signed)
Spoke with pt, GXT scheduled and instructions discussed in detail with the patient.

## 2018-04-22 NOTE — Telephone Encounter (Signed)
Has an appointment with Jory Sims, NP  in about 3 weeks.  If we can get the GXT before then it would be helpful.     Glenetta Hew, MD

## 2018-04-23 ENCOUNTER — Telehealth: Payer: Self-pay | Admitting: Cardiology

## 2018-04-23 ENCOUNTER — Telehealth (HOSPITAL_COMMUNITY): Payer: Self-pay

## 2018-04-23 NOTE — Telephone Encounter (Signed)
New Message: ° ° ° ° ° ° °Pt is returning a call °

## 2018-04-23 NOTE — Telephone Encounter (Signed)
Encounter complete. 

## 2018-04-23 NOTE — Telephone Encounter (Signed)
Spoke with pt who states that he received a phone call this morning from someone. Informed pt per chart, I can see he is scheduled for the stress test on 8/6 and that he spoke to someone yesterday in regards to instruction but there is no indication of a call today. Pt verbalized understanding.

## 2018-04-24 ENCOUNTER — Ambulatory Visit (HOSPITAL_COMMUNITY): Payer: Medicare Other

## 2018-04-24 ENCOUNTER — Encounter (HOSPITAL_COMMUNITY): Payer: Medicare Other

## 2018-04-24 ENCOUNTER — Encounter (HOSPITAL_COMMUNITY)
Admission: RE | Admit: 2018-04-24 | Discharge: 2018-04-24 | Disposition: A | Discharge status: Home / Self Care | Payer: Medicare Other | Source: Ambulatory Visit | Attending: Cardiology | Admitting: Cardiology

## 2018-04-24 DIAGNOSIS — Z79899 Other long term (current) drug therapy: Secondary | ICD-10-CM | POA: Diagnosis not present

## 2018-04-24 DIAGNOSIS — E119 Type 2 diabetes mellitus without complications: Secondary | ICD-10-CM | POA: Diagnosis not present

## 2018-04-24 DIAGNOSIS — Z87891 Personal history of nicotine dependence: Secondary | ICD-10-CM | POA: Diagnosis not present

## 2018-04-24 DIAGNOSIS — I2510 Atherosclerotic heart disease of native coronary artery without angina pectoris: Secondary | ICD-10-CM | POA: Insufficient documentation

## 2018-04-24 DIAGNOSIS — Z955 Presence of coronary angioplasty implant and graft: Secondary | ICD-10-CM | POA: Diagnosis not present

## 2018-04-24 DIAGNOSIS — I10 Essential (primary) hypertension: Secondary | ICD-10-CM | POA: Diagnosis not present

## 2018-04-24 DIAGNOSIS — E785 Hyperlipidemia, unspecified: Secondary | ICD-10-CM | POA: Insufficient documentation

## 2018-04-24 DIAGNOSIS — Z85828 Personal history of other malignant neoplasm of skin: Secondary | ICD-10-CM | POA: Diagnosis not present

## 2018-04-24 DIAGNOSIS — Z7982 Long term (current) use of aspirin: Secondary | ICD-10-CM | POA: Insufficient documentation

## 2018-04-24 DIAGNOSIS — Z7902 Long term (current) use of antithrombotics/antiplatelets: Secondary | ICD-10-CM | POA: Diagnosis not present

## 2018-04-24 DIAGNOSIS — Z7984 Long term (current) use of oral hypoglycemic drugs: Secondary | ICD-10-CM | POA: Diagnosis not present

## 2018-04-27 ENCOUNTER — Ambulatory Visit (HOSPITAL_COMMUNITY): Payer: Medicare Other

## 2018-04-27 ENCOUNTER — Encounter (HOSPITAL_COMMUNITY): Payer: Medicare Other

## 2018-04-27 ENCOUNTER — Encounter (HOSPITAL_COMMUNITY)
Admission: RE | Admit: 2018-04-27 | Discharge: 2018-04-27 | Disposition: A | Discharge status: Home / Self Care | Payer: Medicare Other | Source: Ambulatory Visit | Attending: Cardiology | Admitting: Cardiology

## 2018-04-27 DIAGNOSIS — Z955 Presence of coronary angioplasty implant and graft: Secondary | ICD-10-CM | POA: Diagnosis not present

## 2018-04-28 ENCOUNTER — Ambulatory Visit (HOSPITAL_COMMUNITY)
Admission: RE | Admit: 2018-04-28 | Discharge: 2018-04-28 | Disposition: A | Discharge status: Home / Self Care | Payer: Medicare Other | Source: Ambulatory Visit | Attending: Cardiology | Admitting: Cardiology

## 2018-04-28 DIAGNOSIS — I25119 Atherosclerotic heart disease of native coronary artery with unspecified angina pectoris: Secondary | ICD-10-CM

## 2018-04-28 NOTE — Progress Notes (Signed)
Cardiac Individual Treatment Plan  Patient Details  Name: Gregory Mathews. MRN: [id number] Date of Birth: 1945-12-16 Referring Provider:     CARDIAC REHAB PHASE II ORIENTATION from 03/17/2018 in [place]  Referring Provider  Glenetta Hew MD      Initial Encounter Date:    CARDIAC REHAB PHASE II ORIENTATION from 03/17/2018 in [place]  Date  03/17/18      Visit Diagnosis: Status post coronary artery stent placement  Patient's Home Medications on Admission:  Current Outpatient Medications:  .  aspirin 81 MG tablet, Take 81 mg by mouth every other day. , Disp: , Rfl:  .  blood glucose meter kit and supplies, Use to test blood sugar daily. Dx: E11.9, Disp: 1 each, Rfl: 0 .  clopidogrel (PLAVIX) 75 MG tablet, Take 1 tablet (75 mg total) by mouth daily., Disp: 30 tablet, Rfl: 5 .  glipiZIDE (GLUCOTROL) 5 MG tablet, Take by mouth daily before breakfast., Disp: , Rfl:  .  Lancets MISC, Use to test blood sugar daily. Dx code :E11.9, Disp: 100 each, Rfl: 3 .  losartan (COZAAR) 25 MG tablet, TAKE 1 TABLET BY MOUTH EVERY DAY, Disp: 90 tablet, Rfl: 1 .  metoprolol succinate (TOPROL-XL) 25 MG 24 hr tablet, TAKE 1 TABLET BY MOUTH EVERY DAY, Disp: 30 tablet, Rfl: 10 .  Multiple Vitamin (MULTIVITAMIN) tablet, Take 1 tablet by mouth daily., Disp: , Rfl:  .  nitroGLYCERIN (NITROSTAT) 0.4 MG SL tablet, Place 1 tablet (0.4 mg total) under the tongue every 5 (five) minutes x 3 doses as needed for chest pain., Disp: 25 tablet, Rfl: 3 .  ONE TOUCH ULTRA TEST test strip, TEST BLOOD SUGAR DAILY, Disp: 100 each, Rfl: 2 .  sitaGLIPtin (JANUVIA) 100 MG tablet, Take 1 tablet (100 mg total) by mouth daily., Disp: 90 tablet, Rfl: 1  Past Medical History: Past Medical History:  Diagnosis Date  . Allergy   . Basal cell carcinoma 01/2013   Nasal and R forearm.  [facility]  . CAD S/P percutaneous coronary angioplasty    2/19  PCI/DESx1 to mLAD, FFR 0.7 -Sierra DES 2.75 mm x 18 mm  . Cataract   . Diabetes mellitus without complication (HCC)    on PO Meds  . Essential hypertension   . Hyperlipidemia    statin intolerant -- myalgias, fatigue (tried at least 4)  . Rosacea     Tobacco Use: Social History   Tobacco Use  Smoking Status Former Smoker  . Packs/day: 1.00  . Years: 15.00  . Pack years: 15.00  . Last attempt to quit: 07/09/1982  . Years since quitting: 35.8  Smokeless Tobacco Never Used    Labs: Recent Chemical engineer    Labs for ITP Cardiac and Pulmonary Rehab Latest Ref Rng & Units 08/14/2016 11/29/2016 05/07/2017 08/22/2017 03/11/2018   Cholestrol 100 - 199 mg/dL 174 - 183 168 193   LDLCALC 0 - 99 mg/dL 113(H) - 121(H) 106(H) 125(H)   HDL >39 mg/dL 37(L) - 39(L) 38(L) 37(L)   Trlycerides 0 - 149 mg/dL 120 - 114 118 156(H)   Hemoglobin A1c 4.0 - 5.6 % 6.6(H) 7.4(H) 7.1 7.1(H) 7.3(A)      Capillary Blood Glucose: Lab Results  Component Value Date   GLUCAP 123 (H) 04/06/2018   GLUCAP 126 (H) 04/06/2018   GLUCAP 132 (H) 04/03/2018   GLUCAP 169 (H) 04/03/2018   GLUCAP 112 (H) 04/01/2018     Exercise  Target Goals:    Exercise Program Goal: Individual exercise prescription set using results from initial 6 min walk test and THRR while considering  patient's activity barriers and safety.   Exercise Prescription Goal: Initial exercise prescription builds to 30-45 minutes a day of aerobic activity, 2-3 days per week.  Home exercise guidelines will be given to patient during program as part of exercise prescription that the participant will acknowledge.  Activity Barriers & Risk Stratification: Activity Barriers & Cardiac Risk Stratification - 03/17/18 1511      Activity Barriers & Cardiac Risk Stratification   Activity Barriers  Deconditioning;Muscular Weakness    Cardiac Risk Stratification  High       6 Minute Walk: 6 Minute Walk    Row Name 03/17/18 1459 03/17/18 1556        6 Minute Walk   Phase  Initial  -    Distance  1919 feet  -    Walk Time  6 minutes  -    # of Rest Breaks  0  -    MPH  3.6  -    METS  3.7  -    RPE  10  -    Symptoms  No  -    Resting HR  65 bpm  -    Resting BP  120/70  -    Resting Oxygen Saturation   99 %  -    Exercise Oxygen Saturation  during 6 min walk  98 %  -    Max Ex. HR  91 bpm  -    Max Ex. BP  150/70  -    2 Minute Post BP  -  130/70       Oxygen Initial Assessment:   Oxygen Re-Evaluation:   Oxygen Discharge (Final Oxygen Re-Evaluation):   Initial Exercise Prescription: Initial Exercise Prescription - 03/17/18 1500      Date of Initial Exercise RX and Referring Provider   Date  03/17/18    Referring Provider  Glenetta Hew MD      Treadmill   MPH  3    Grade  0    Minutes  10    METs  3.3      Bike   Level  1.2    Minutes  10    METs  3.4      NuStep   Level  4    SPM  80    Minutes  10    METs  3      Prescription Details   Frequency (times per week)  3    Duration  Progress to 30 minutes of continuous aerobic without signs/symptoms of physical distress      Intensity   THRR 40-80% of Max Heartrate  60-120    Ratings of Perceived Exertion  11-15    Perceived Dyspnea  0-4      Progression   Progression  Continue to progress workloads to maintain intensity without signs/symptoms of physical distress.      Resistance Training   Training Prescription  Yes    Weight  4lbs    Reps  10-15       Perform Capillary Blood Glucose checks as needed.  Exercise Prescription Changes: Exercise Prescription Changes    Row Name 04/03/18 0900 04/03/18 0949 04/06/18 0951 04/24/18 0950       Response to Exercise   Blood Pressure (Admit)  132/68  128/70 Previous column from 04/01/18 0951  122/78  132/68    Blood Pressure (Exercise)  144/64  166/72  160/80  124/60    Blood Pressure (Exit)  118/60  122/62  122/66  114/60    Heart Rate (Admit)  62 bpm  85 bpm  71 bpm  72 bpm    Heart  Rate (Exercise)  120 bpm  111 bpm  128 bpm  111 bpm    Heart Rate (Exit)  70 bpm  69 bpm  79 bpm  78 bpm    Rating of Perceived Exertion (Exercise)  _0 Duration  Progress to 30 minutes of  aerobic without signs/symptoms of physical distress  Progress to 30 minutes of  aerobic without signs/symptoms of physical distress  Progress to 30 minutes of  aerobic without signs/symptoms of physical distress  Progress to 30 minutes of  aerobic without signs/symptoms of physical distress    Intensity  THRR unchanged  THRR unchanged  THRR unchanged  THRR unchanged      Progression   Progression  Continue to progress workloads to maintain intensity without signs/symptoms of physical distress.  Continue to progress workloads to maintain intensity without signs/symptoms of physical distress.  Continue to progress workloads to maintain intensity without signs/symptoms of physical distress.  Continue to progress workloads to maintain intensity without signs/symptoms of physical distress.    Average METs  3  3  3.6  3.7      Resistance Training   Training Prescription  No Relaxation day, no weights.  Yes  Yes  Yes    Weight  -  4lbs  4lbs  4lbs    Reps  -  10-15  10-15  10-15    Time  -  10 Minutes  10 Minutes  10 Minutes      Interval Training   Interval Training  No  No  No  No      Treadmill   MPH  3  3  3.3  3.5    Grade  0  0  1  3    Minutes  _1 METs  3.3  3.3  3.98  5.13      Bike   Level  1.2  1.2  1.4  1.4    Minutes  _2 METs  3.31  3.29  3.67  3.7      NuStep   Level  _3 SPM  80  80  80  80    Minutes  _4 METs  2.4  2.4  3.1  2.4      Home Exercise Plan   Plans to continue exercise at  -  -  -  Home (comment)    Frequency  -  -  -  Add 4 additional days to program exercise sessions.    Initial Home Exercises Provided  -  -  -  04/13/18       Exercise Comments: Exercise Comments    Row Name 04/03/18 1045  04/06/18 1030 04/13/18 1011 04/24/18 1003     Exercise Comments  Patient tolerated 1st session of exercise well without c/o.  Reviewed METs with patient.  Reviewed home exercise guidelines with patient.  Reviewed METs and goals with patient.       Exercise Goals and  Review: Exercise Goals    Row Name 03/17/18 1507             Exercise Goals   Increase Physical Activity  Yes       Intervention  Provide advice, education, support and counseling about physical activity/exercise needs.;Develop an individualized exercise prescription for aerobic and resistive training based on initial evaluation findings, risk stratification, comorbidities and participant's personal goals.       Expected Outcomes  Short Term: Attend rehab on a regular basis to increase amount of physical activity.;Long Term: Add in home exercise to make exercise part of routine and to increase amount of physical activity.;Long Term: Exercising regularly at least 3-5 days a week.       Increase Strength and Stamina  Yes       Intervention  Provide advice, education, support and counseling about physical activity/exercise needs.;Develop an individualized exercise prescription for aerobic and resistive training based on initial evaluation findings, risk stratification, comorbidities and participant's personal goals.       Expected Outcomes  Short Term: Increase workloads from initial exercise prescription for resistance, speed, and METs.;Short Term: Perform resistance training exercises routinely during rehab and add in resistance training at home;Long Term: Improve cardiorespiratory fitness, muscular endurance and strength as measured by increased METs and functional capacity (6MWT)       Able to understand and use rate of perceived exertion (RPE) scale  Yes       Intervention  Provide education and explanation on how to use RPE scale       Expected Outcomes  Short Term: Able to use RPE daily in rehab to express subjective intensity  level;Long Term:  Able to use RPE to guide intensity level when exercising independently       Knowledge and understanding of Target Heart Rate Range (THRR)  Yes       Intervention  Provide education and explanation of THRR including how the numbers were predicted and where they are located for reference       Expected Outcomes  Short Term: Able to state/look up THRR;Long Term: Able to use THRR to govern intensity when exercising independently;Short Term: Able to use daily as guideline for intensity in rehab       Able to check pulse independently  Yes       Intervention  Provide education and demonstration on how to check pulse in carotid and radial arteries.;Review the importance of being able to check your own pulse for safety during independent exercise       Expected Outcomes  Short Term: Able to explain why pulse checking is important during independent exercise;Long Term: Able to check pulse independently and accurately       Understanding of Exercise Prescription  Yes       Intervention  Provide education, explanation, and written materials on patient's individual exercise prescription       Expected Outcomes  Short Term: Able to explain program exercise prescription;Long Term: Able to explain home exercise prescription to exercise independently          Exercise Goals Re-Evaluation : Exercise Goals Re-Evaluation    Row Name 04/03/18 1045 04/13/18 1011 04/24/18 1003         Exercise Goal Re-Evaluation   Exercise Goals Review  Able to understand and use rate of perceived exertion (RPE) scale;Increase Physical Activity  Understanding of Exercise Prescription;Knowledge and understanding of Target Heart Rate Range (THRR)  Knowledge and understanding of Target Heart Rate Range (THRR)  Comments  Patient able to understand and use RPE scale appropriately.  Reviewed home exercise guidelines with patient including THRR, RPE scale, and endpoints for exercise. Pt states he's walking 45 minutes  daily as his mode of home exercise.  Patient is progressing workloads at CR and walking daily as his mode of home exercise.     Expected Outcomes  Increase workloads as tolerated.  Patient will continue daily exercise routine to help achieve personal health and fitness goals.  Continue current daily exercise routine.         Discharge Exercise Prescription (Final Exercise Prescription Changes): Exercise Prescription Changes - 04/24/18 0950      Response to Exercise   Blood Pressure (Admit)  132/68    Blood Pressure (Exercise)  124/60    Blood Pressure (Exit)  114/60    Heart Rate (Admit)  72 bpm    Heart Rate (Exercise)  111 bpm    Heart Rate (Exit)  78 bpm    Rating of Perceived Exertion (Exercise)  12    Duration  Progress to 30 minutes of  aerobic without signs/symptoms of physical distress    Intensity  THRR unchanged      Progression   Progression  Continue to progress workloads to maintain intensity without signs/symptoms of physical distress.    Average METs  3.7      Resistance Training   Training Prescription  Yes    Weight  4lbs    Reps  10-15    Time  10 Minutes      Interval Training   Interval Training  No      Treadmill   MPH  3.5    Grade  3    Minutes  10    METs  5.13      Bike   Level  1.4    Minutes  10    METs  3.7      NuStep   Level  4    SPM  80    Minutes  10    METs  2.4      Home Exercise Plan   Plans to continue exercise at  Home (comment)    Frequency  Add 4 additional days to program exercise sessions.    Initial Home Exercises Provided  04/13/18       Nutrition:  Target Goals: Understanding of nutrition guidelines, daily intake of sodium <1566m, cholesterol <2062m calories 30% from fat and 7% or less from saturated fats, daily to have 5 or more servings of fruits and vegetables.  Biometrics: Pre Biometrics - 03/17/18 1508      Pre Biometrics   Height  _0  (1.727 m)    Weight  215 lb 13.3 oz (97.9 kg)    Waist  Circumference  43.5 inches    Hip Circumference  41 inches    Waist to Hip Ratio  1.06 %    BMI (Calculated)  32.82    Triceps Skinfold  20 mm    % Body Fat  32.4 %    Grip Strength  45 kg    Flexibility  9 in    Single Leg Stand  8.03 seconds        Nutrition Therapy Plan and Nutrition Goals: Nutrition Therapy & Goals - 04/03/18 1059      Nutrition Therapy   Diet  heart healthy      Personal Nutrition Goals   Nutrition Goal  Pt to identify food quantities necessary to  achieve weight loss of 6-15 lbs. at graduation from cardiac rehab.    Personal Goal #2  Pt to identify and limit food sources of saturated fat, trans fat, and sodium      Intervention Plan   Intervention  Prescribe, educate and counsel regarding individualized specific dietary modifications aiming towards targeted core components such as weight, hypertension, lipid management, diabetes, heart failure and other comorbidities.    Expected Outcomes  Short Term Goal: Understand basic principles of dietary content, such as calories, fat, sodium, cholesterol and nutrients.       Nutrition Assessments: Nutrition Assessments - 04/03/18 1059      MEDFICTS Scores   Pre Score  18       Nutrition Goals Re-Evaluation:   Nutrition Goals Re-Evaluation:   Nutrition Goals Discharge (Final Nutrition Goals Re-Evaluation):   Psychosocial: Target Goals: Acknowledge presence or absence of significant depression and/or stress, maximize coping skills, provide positive support system. Participant is able to verbalize types and ability to use techniques and skills needed for reducing stress and depression.  Initial Review & Psychosocial Screening: Initial Psych Review & Screening - 03/17/18 1544      Initial Review   Current issues with  None Identified      Family Dynamics   Good Support System?  No      Barriers   Psychosocial barriers to participate in program  There are no identifiable barriers or psychosocial  needs.      Screening Interventions   Interventions  Encouraged to exercise       Quality of Life Scores: Quality of Life - 03/17/18 1556      Quality of Life   Select  Quality of Life      Scores of 19 and below usually indicate a poorer quality of life in these areas.  A difference of  2-3 points is a clinically meaningful difference.  A difference of 2-3 points in the total score of the Quality of Life Index has been associated with significant improvement in overall quality of life, self-image, physical symptoms, and general health in studies assessing change in quality of life.  PHQ-9: Recent Review Flowsheet Data    Depression screen [facility] 2/9 04/20/2018 04/01/2018 03/11/2018 11/27/2017 08/22/2017   Decreased Interest 0 0 0 0 -   Down, Depressed, Hopeless 0 0 0 0 0   PHQ - 2 Score 0 0 0 0 0     Interpretation of Total Score  Total Score Depression Severity:  1-4 = Minimal depression, 5-9 = Mild depression, 10-14 = Moderate depression, 15-19 = Moderately severe depression, 20-27 = Severe depression   Psychosocial Evaluation and Intervention:   Psychosocial Re-Evaluation: Psychosocial Re-Evaluation    [place] Name 04/28/18 1418             Psychosocial Re-Evaluation   Current issues with  None Identified       Interventions  Encouraged to attend Cardiac Rehabilitation for the exercise       Continue Psychosocial Services   No Follow up required          Psychosocial Discharge (Final Psychosocial Re-Evaluation): Psychosocial Re-Evaluation - 04/28/18 1418      Psychosocial Re-Evaluation   Current issues with  None Identified    Interventions  Encouraged to attend Cardiac Rehabilitation for the exercise    Continue Psychosocial Services   No Follow up required       Vocational Rehabilitation: Provide vocational rehab assistance to qualifying candidates.   Vocational Rehab Evaluation &  Intervention:   Education: Education Goals: Education classes will be provided  on a weekly basis, covering required topics. Participant will state understanding/return demonstration of topics presented.  Learning Barriers/Preferences: Learning Barriers/Preferences - 03/17/18 1459      Learning Barriers/Preferences   Learning Barriers  Sight    Learning Preferences  Written Material;Video;Verbal Instruction;Skilled Demonstration;Pictoral       Education Topics: Count Your Pulse:  -Group instruction provided by verbal instruction, demonstration, patient participation and written materials to support subject.  Instructors address importance of being able to find your pulse and how to count your pulse when at home without a heart monitor.  Patients get hands on experience counting their pulse with staff help and individually.   Heart Attack, Angina, and Risk Factor Modification:  -Group instruction provided by verbal instruction, video, and written materials to support subject.  Instructors address signs and symptoms of angina and heart attacks.    Also discuss risk factors for heart disease and how to make changes to improve heart health risk factors.   Functional Fitness:  -Group instruction provided by verbal instruction, demonstration, patient participation, and written materials to support subject.  Instructors address safety measures for doing things around the house.  Discuss how to get up and down off the floor, how to pick things up properly, how to safely get out of a chair without assistance, and balance training.   Meditation and Mindfulness:  -Group instruction provided by verbal instruction, patient participation, and written materials to support subject.  Instructor addresses importance of mindfulness and meditation practice to help reduce stress and improve awareness.  Instructor also leads participants through a meditation exercise.    Stretching for Flexibility and Mobility:  -Group instruction provided by verbal instruction, patient participation,  and written materials to support subject.  Instructors lead participants through series of stretches that are designed to increase flexibility thus improving mobility.  These stretches are additional exercise for major muscle groups that are typically performed during regular warm up and cool down.   Hands Only CPR:  -Group verbal, video, and participation provides a basic overview of AHA guidelines for community CPR. Role-play of emergencies allow participants the opportunity to practice calling for help and chest compression technique with discussion of AED use.   Hypertension: -Group verbal and written instruction that provides a basic overview of hypertension including the most recent diagnostic guidelines, risk factor reduction with self-care instructions and medication management.    Nutrition I class: Heart Healthy Eating:  -Group instruction provided by PowerPoint slides, verbal discussion, and written materials to support subject matter. The instructor gives an explanation and review of the Therapeutic Lifestyle Changes diet recommendations, which includes a discussion on lipid goals, dietary fat, sodium, fiber, plant stanol/sterol esters, sugar, and the components of a well-balanced, healthy diet.   Nutrition II class: Lifestyle Skills:  -Group instruction provided by PowerPoint slides, verbal discussion, and written materials to support subject matter. The instructor gives an explanation and review of label reading, grocery shopping for heart health, heart healthy recipe modifications, and ways to make healthier choices when eating out.   Diabetes Question & Answer:  -Group instruction provided by PowerPoint slides, verbal discussion, and written materials to support subject matter. The instructor gives an explanation and review of diabetes co-morbidities, pre- and post-prandial blood glucose goals, pre-exercise blood glucose goals, signs, symptoms, and treatment of hypoglycemia and  hyperglycemia, and foot care basics.   Diabetes Blitz:  -Group instruction provided by [employer], verbal discussion, and  written materials to support subject matter. The instructor gives an explanation and review of the physiology behind type 1 and type 2 diabetes, diabetes medications and rational behind using different medications, pre- and post-prandial blood glucose recommendations and Hemoglobin A1c goals, diabetes diet, and exercise including blood glucose guidelines for exercising safely.    Portion Distortion:  -Group instruction provided by PowerPoint slides, verbal discussion, written materials, and food models to support subject matter. The instructor gives an explanation of serving size versus portion size, changes in portions sizes over the last 20 years, and what consists of a serving from each food group.   Stress Management:  -Group instruction provided by verbal instruction, video, and written materials to support subject matter.  Instructors review role of stress in heart disease and how to cope with stress positively.     Exercising on Your Own:  -Group instruction provided by verbal instruction, power point, and written materials to support subject.  Instructors discuss benefits of exercise, components of exercise, frequency and intensity of exercise, and end points for exercise.  Also discuss use of nitroglycerin and activating EMS.  Review options of places to exercise outside of rehab.  Review guidelines for sex with heart disease.   Cardiac Drugs I:  -Group instruction provided by verbal instruction and written materials to support subject.  Instructor reviews cardiac drug classes: antiplatelets, anticoagulants, beta blockers, and statins.  Instructor discusses reasons, side effects, and lifestyle considerations for each drug class.   Cardiac Drugs II:  -Group instruction provided by verbal instruction and written materials to support subject.  Instructor  reviews cardiac drug classes: angiotensin converting enzyme inhibitors (ACE-I), angiotensin II receptor blockers (ARBs), nitrates, and calcium channel blockers.  Instructor discusses reasons, side effects, and lifestyle considerations for each drug class.   Anatomy and Physiology of the Circulatory System:  Group verbal and written instruction and models provide basic cardiac anatomy and physiology, with the coronary electrical and arterial systems. Review of: AMI, Angina, Valve disease, Heart Failure, Peripheral Artery Disease, Cardiac Arrhythmia, Pacemakers, and the ICD.   Other Education:  -Group or individual verbal, written, or video instructions that support the educational goals of the cardiac rehab program.   Holiday Eating Survival Tips:  -Group instruction provided by PowerPoint slides, verbal discussion, and written materials to support subject matter. The instructor gives patients tips, tricks, and techniques to help them not only survive but enjoy the holidays despite the onslaught of food that accompanies the holidays.   Knowledge Questionnaire Score:   Core Components/Risk Factors/Patient Goals at Admission: Personal Goals and Risk Factors at Admission - 03/17/18 1509      Core Components/Risk Factors/Patient Goals on Admission    Weight Management  Yes;Obesity;Weight Maintenance;Weight Loss    Intervention  Weight Management: Develop a combined nutrition and exercise program designed to reach desired caloric intake, while maintaining appropriate intake of nutrient and fiber, sodium and fats, and appropriate energy expenditure required for the weight goal.;Weight Management: Provide education and appropriate resources to help participant work on and attain dietary goals.;Weight Management/Obesity: Establish reasonable short term and long term weight goals.;Obesity: Provide education and appropriate resources to help participant work on and attain dietary goals.    Admit Weight   215 lb 13.3 oz (97.9 kg)    Goal Weight: Short Term  210 lb (95.3 kg)    Goal Weight: Long Term  200 lb (90.7 kg)    Expected Outcomes  Short Term: Continue to assess and modify interventions until short term  weight is achieved;Long Term: Adherence to nutrition and physical activity/exercise program aimed toward attainment of established weight goal;Weight Maintenance: Understanding of the daily nutrition guidelines, which includes 25-35% calories from fat, 7% or less cal from saturated fats, less than 228m cholesterol, less than 1.5gm of sodium, & 5 or more servings of fruits and vegetables daily;Weight Loss: Understanding of general recommendations for a balanced deficit meal plan, which promotes 1-2 lb weight loss per week and includes a negative energy balance of [phone] kcal/d;Understanding recommendations for meals to include 15-35% energy as protein, 25-35% energy from fat, 35-60% energy from carbohydrates, less than 2036mof dietary cholesterol, 20-35 gm of total fiber daily;Understanding of distribution of calorie intake throughout the day with the consumption of 4-5 meals/snacks    Lipids  Yes    Intervention  Provide education and support for participant on nutrition & aerobic/resistive exercise along with prescribed medications to achieve LDL <708mHDL >72m81m  Expected Outcomes  Short Term: Participant states understanding of desired cholesterol values and is compliant with medications prescribed. Participant is following exercise prescription and nutrition guidelines.;Long Term: Cholesterol controlled with medications as prescribed, with individualized exercise RX and with personalized nutrition plan. Value goals: LDL < 70mg53mL > 40 mg.       Core Components/Risk Factors/Patient Goals Review:  Goals and Risk Factor Review    Row Name 04/28/18 1415             Core Components/Risk Factors/Patient Goals Review   Personal Goals Review  Weight Management/Obesity;Lipids        Review  Ronith's vital signs have been stable at phase 2 cardiac rehab. Lucius has maintained his current weight.       Expected Outcomes  Demmus will continue to participate in phase 2 cardiac rehab, take his medications as presribed and follow lifestyle modificaitons          Core Components/Risk Factors/Patient Goals at Discharge (Final Review):  Goals and Risk Factor Review - 04/28/18 1415      Core Components/Risk Factors/Patient Goals Review   Personal Goals Review  Weight Management/Obesity;Lipids    Review  Doyl's vital signs have been stable at phase 2 cardiac rehab. Keaun has maintained his current weight.    Expected Outcomes  Demmus will continue to participate in phase 2 cardiac rehab, take his medications as presribed and follow lifestyle modificaitons       ITP Comments: ITP Comments    Row Name 03/17/18 1338 04/28/18 1412         ITP Comments  Medical Director- Dr. TraciFransico Him 30 Day ITP Review. Patient with good attendance and participation in phase 2 cardiac rehab         Comments: See ITP comments.MariaBarnet PallBSN 04/28/2018 2:21 PM

## 2018-04-29 ENCOUNTER — Encounter (HOSPITAL_COMMUNITY): Payer: Medicare Other

## 2018-04-29 ENCOUNTER — Ambulatory Visit (HOSPITAL_COMMUNITY): Payer: Medicare Other

## 2018-04-29 LAB — EXERCISE TOLERANCE TEST
CHL CUP RESTING HR STRESS: 65 {beats}/min
CSEPED: 7 min
Estimated workload: 9.7 METS
Exercise duration (sec): 46 s
MPHR: 150 {beats}/min
Peak HR: 144 {beats}/min
Percent HR: 96 %
RPE: 17

## 2018-05-01 ENCOUNTER — Ambulatory Visit (HOSPITAL_COMMUNITY): Payer: Medicare Other

## 2018-05-01 ENCOUNTER — Encounter (HOSPITAL_COMMUNITY): Admission: RE | Admit: 2018-05-01 | Payer: Medicare Other | Source: Ambulatory Visit

## 2018-05-01 ENCOUNTER — Encounter (HOSPITAL_COMMUNITY): Payer: Medicare Other

## 2018-05-04 ENCOUNTER — Ambulatory Visit (HOSPITAL_COMMUNITY): Payer: Medicare Other

## 2018-05-04 ENCOUNTER — Encounter (HOSPITAL_COMMUNITY): Payer: Medicare Other

## 2018-05-04 ENCOUNTER — Telehealth: Payer: Self-pay | Admitting: *Deleted

## 2018-05-04 ENCOUNTER — Encounter (HOSPITAL_COMMUNITY)
Admission: RE | Admit: 2018-05-04 | Discharge: 2018-05-04 | Disposition: A | Discharge status: Home / Self Care | Payer: Medicare Other | Source: Ambulatory Visit | Attending: Cardiology | Admitting: Cardiology

## 2018-05-04 DIAGNOSIS — Z955 Presence of coronary angioplasty implant and graft: Secondary | ICD-10-CM | POA: Diagnosis not present

## 2018-05-04 NOTE — Telephone Encounter (Signed)
-----   Message from Minus Breeding, MD sent at 04/29/2018  5:23 PM EDT ----- No evidence of ischemia on this stress test.  Please call with results.  Send results to Horald Pollen, MD

## 2018-05-04 NOTE — Telephone Encounter (Signed)
Spoke to patient. Result given . 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 RN  INFORMED PATIENT , WILL BE ABLE TO GIVE HIM A COPY OF RESULT IF NEED . DR HARDING NOT IN OFFICE  FOR LETTER PATIENT STATES HE WILL CALL BACK IF THAT IS SUFFICIENT ROUTED TO PRIMARY

## 2018-05-05 ENCOUNTER — Telehealth: Payer: Self-pay | Admitting: Emergency Medicine

## 2018-05-05 NOTE — Telephone Encounter (Signed)
Copied from [place] [id number]. Topic: Quick Communication - See Telephone Encounter >> May 05, 2018  1:38 PM Bea Graff, NT wrote: CRM for notification. See Telephone encounter for: 05/05/18. 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

## 2018-05-06 ENCOUNTER — Ambulatory Visit (HOSPITAL_COMMUNITY): Payer: Medicare Other

## 2018-05-06 ENCOUNTER — Encounter (HOSPITAL_COMMUNITY): Payer: Medicare Other

## 2018-05-08 ENCOUNTER — Ambulatory Visit (HOSPITAL_COMMUNITY): Payer: Medicare Other

## 2018-05-08 ENCOUNTER — Encounter (HOSPITAL_COMMUNITY): Payer: Medicare Other

## 2018-05-08 ENCOUNTER — Encounter (HOSPITAL_COMMUNITY)
Admission: RE | Admit: 2018-05-08 | Discharge: 2018-05-08 | Disposition: A | Discharge status: Home / Self Care | Payer: Medicare Other | Source: Ambulatory Visit | Attending: Cardiology | Admitting: Cardiology

## 2018-05-08 DIAGNOSIS — Z955 Presence of coronary angioplasty implant and graft: Secondary | ICD-10-CM

## 2018-05-11 ENCOUNTER — Encounter (HOSPITAL_COMMUNITY): Payer: Medicare Other

## 2018-05-11 ENCOUNTER — Ambulatory Visit (HOSPITAL_COMMUNITY): Payer: Medicare Other

## 2018-05-12 NOTE — Telephone Encounter (Signed)
Please advise 

## 2018-05-12 NOTE — Progress Notes (Signed)
Cardiology Office Note   Date:  05/18/2018   ID:  Gregory Roller., DOB [birth year], MRN [id number]  PCP:  Horald Pollen, MD  Cardiologist:  Dr. Ellyn Hack   Chief Complaint  Patient presents with  . Coronary Artery Disease  . Hyperlipidemia     History of Present Illness: Gregory Zayaan Kozak. is a 72 y.o. male who presents for ongoing assessment and management of CAD, cardiac cath 11/12/2017, revealing proximal LAD lesion 70% with DES and PCI, with 0% stenosis after intervention. He also had a 40% mid LAD lesion well beyond the stent, Ostial Ramus 50% stenosed. He is intolerant of Crestor. He was continued on BB, ARB, DAPT with ASA and Plavix. Marland Kitchen He was started on pravastatin. He was to have follow up Lipids and LFTs.   He states that he was unable to take the Pravachol due to significant myalgia symptoms. He has been on Crestor in the past and has been unable to take it due to myalgia pain. Last lipids were completed in June, 2019. LDL was 125, TC 193.  He has stopped taking Januvia as he felt is was affecting his blood sugar causing it to be too low, He is self manipulating glipizide based upon blood sugars. 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   Last stress test was completed on  04/28/2018 Study Highlights     Blood pressure demonstrated a normal response to exercise.  No T wave inversion was noted during stress.  There was no ST segment deviation noted during stress.  Overall, the patient's exercise capacity was normal.  Arrhythmias during stress: rare PVCs.  [facility] Treadmill Score: low risk   Negative stress test without evidence of ischemia at given workload.      Past Medical History:  Diagnosis Date  . Allergy   . Basal cell carcinoma 01/2013   Nasal and R forearm.  [facility]  . CAD S/P percutaneous coronary angioplasty    2/19 PCI/DESx1 to mLAD, FFR 0.7 -Sierra DES 2.75 mm x 18 mm  . Cataract   . Diabetes mellitus without  complication (HCC)    on PO Meds  . Essential hypertension   . Hyperlipidemia    statin intolerant -- myalgias, fatigue (tried at least 4)  . Rosacea     Past Surgical History:  Procedure Laterality Date  . basal cell removal     nose and wrist   . BELPHAROPTOSIS REPAIR    . CORONARY CT ANGIOGRAM  09/2017   Coronary Calcium Score: 111. Coronary calcification noted in the LEFT MAIN (LM) and prox LAD.  LM < 50% calcified stenosis.  Mid LAD 50-75% plaque noted CT FFR --> positive at 0.80.  Recommend CARDIAC CATHETERIZATION  . CORONARY STENT INTERVENTION N/A 11/12/2017   Procedure: CORONARY STENT INTERVENTION;  Surgeon: Leonie Man, MD;  Location: [facility];  Service: Cardiovascular:  DES PCI:  STENT SIERRA 2.75 X 18 MM - Post intervention, there is a 0% residual stenosis.  Marland Kitchen ETT/GXT: Graded Exercise Tolerance Test  08/2015    Exercised for 9:01 min --> blunted blood pressure response no EKG changes.  No chest pain.  Low Risk  . EYE SURGERY     Cataract surgery  . eyelid surgery    . INTRAVASCULAR PRESSURE WIRE/FFR STUDY N/A 11/12/2017   Procedure: INTRAVASCULAR PRESSURE WIRE/FFR STUDY;  Surgeon: Leonie Man, MD;  Location: [facility];  Service: Cardiovascular;  Laterality: LAD -  FFR 0.76  . LEFT HEART CATH AND CORONARY ANGIOGRAPHY N/A 11/12/2017   Procedure: LEFT HEART CATH AND CORONARY ANGIOGRAPHY;  Surgeon: Leonie Man, MD;  Location: [facility]: 70% P-M LAD (FFR 0.76).  40% mid LAD, 50% ostial ramus.  EF 55-60%.  Mildly elevated LVEDP.  Marland Kitchen SHOULDER OPEN ROTATOR CUFF REPAIR  1983  . TRANSTHORACIC ECHOCARDIOGRAM  08/2017   EF 55-60%.  Mild aortic stenosis (mean gradient 11 mmH.)  GR 1 DD.  Mild LA dilation.     Current Outpatient Medications  Medication Sig Dispense Refill  . aspirin 81 MG tablet Take 81 mg by mouth every other day.     . blood glucose meter kit and supplies Use to test blood sugar daily. Dx: E11.9 1 each 0  . clopidogrel (PLAVIX)  75 MG tablet Take 1 tablet (75 mg total) by mouth daily. 30 tablet 5  . glipiZIDE (GLUCOTROL) 5 MG tablet Take by mouth daily before breakfast.    . Lancets MISC Use to test blood sugar daily. Dx code :E11.9 100 each 3  . losartan (COZAAR) 25 MG tablet TAKE 1 TABLET BY MOUTH EVERY DAY 90 tablet 1  . metoprolol succinate (TOPROL-XL) 25 MG 24 hr tablet TAKE 1 TABLET BY MOUTH EVERY DAY 30 tablet 10  . Multiple Vitamin (MULTIVITAMIN) tablet Take 1 tablet by mouth daily.    . nitroGLYCERIN (NITROSTAT) 0.4 MG SL tablet Place 1 tablet (0.4 mg total) under the tongue every 5 (five) minutes x 3 doses as needed for chest pain. 25 tablet 3  . ONE TOUCH ULTRA TEST test strip TEST BLOOD SUGAR DAILY 100 each 2  . sitaGLIPtin (JANUVIA) 100 MG tablet Take 1 tablet (100 mg total) by mouth daily. 90 tablet 1   No current facility-administered medications for this visit.     Allergies:   Lisinopril and Statins    Social History:  The patient  reports that he quit smoking about 35 years ago. He has a 15.00 pack-year smoking history. He has never used smokeless tobacco. He reports that he does not drink alcohol or use drugs.   Family History:  The patient's family history includes Cancer in his brother and maternal grandmother; Cancer (age of onset: 52) in his father; Colon cancer (age of onset: 34) in his father; Congestive Heart Failure in his brother; Coronary artery disease (age of onset: 93) in his father; Diabetes in his brother and sister; Diabetes (age of onset: 83) in his brother; Heart attack (age of onset: 65) in his brother; Leukemia in his mother; Peripheral Artery Disease in his father; Rheumatic fever in his brother; Stroke in his maternal grandfather; Stroke (age of onset: 49) in his father; Sudden Cardiac Death (age of onset: 52) in his sister.    ROS: All other systems are reviewed and negative. Unless otherwise mentioned in H&P    PHYSICAL EXAM: VS:  There were no vitals taken for this visit.  , BMI There is no height or weight on file to calculate BMI. GEN: Well nourished, well developed, in no acute distress  HEENT: normal  Neck: no JVD, carotid bruits, or masses Cardiac: OEU;2/3 systolic murmurs,radiationint into the right carotid. no rubs, or gallops,no edema  Respiratory:  clear to auscultation bilaterally, normal work of breathing GI: soft, nontender, nondistended, + BS MS: no deformity or atrophy  Skin: warm and dry, no rash Neuro:  Strength and sensation are intact Psych: euthymic mood, full affect   EKG: NSR rate of  64 bpm.   Recent Labs: 11/04/2017: Hemoglobin 13.3; Platelets 244 03/11/2018: ALT 22; BUN 14; Creatinine, Ser 0.87; Potassium 4.9; Sodium 141    Lipid Panel    Component Value Date/Time   CHOL 193 03/11/2018 0939   TRIG 156 (H) 03/11/2018 0939   HDL 37 (L) 03/11/2018 0939   CHOLHDL 5.2 (H) 03/11/2018 0939   CHOLHDL 4.7 08/14/2016 0846   VLDL 24 08/14/2016 0846   LDLCALC 125 (H) 03/11/2018 0939      Wt Readings from Last 3 Encounters:  04/20/18 221 lb 6.4 oz (100.4 kg)  04/03/18 219 lb 9.3 oz (99.6 kg)  03/17/18 215 lb 13.3 oz (97.9 kg)      Other studies Reviewed: Prox LAD lesion is 70% stenosed. FFR 0.76: CULPRIT LESION  DES PCI:  STENT SIERRA 2.75 X 18 MM - Post intervention, there is a 0% residual stenosis.  Mid LAD lesion is 40% stenosed well beyond the stent.  Ost Ramus lesion is 50% stenosed.  The left ventricular systolic function is normal. EF 55-65% by visual estimate.  LV end diastolic pressure is mildly elevated.   ASSESSMENT AND PLAN:  1. CAD: Doing well from a cardiac standpoint. He has been participating in cardiac rehab. No recurrent chest pain. Continue DAPT for at least 6 more months and reevaluate in February. Stress test was negative for ischemia.   2. Hyperlipidemia: He is intolerant to statins, and has failed Crestor and Pravachol due to myalgia;s. I will repeat his Lipids and LFT's today for evaluation and  baseline. He is being referred to pharmacy to evaluate his acceptability for PCSK9 therapy.   3. Hypertension: Elevated this am. Better after sitting for a while down to 135/80. Still should be lower for diabetic patient. Will follow. BMET today.   4. Diabetes: He would like to try Januvia again. I have given him Rx. He will need to follow up with his PCP for diabetic management.   Current medicines are reviewed at length with the patient today.    Labs/ tests ordered today include: Lipids and LFT's.  Phill Myron. West Pugh, ANP, AACC   05/18/2018 8:20 AM    [facility] [address], [place], [place] [address] Phone: [phone]; Fax: [phone]

## 2018-05-13 ENCOUNTER — Encounter (HOSPITAL_COMMUNITY): Payer: Medicare Other

## 2018-05-13 ENCOUNTER — Ambulatory Visit (HOSPITAL_COMMUNITY): Payer: Medicare Other

## 2018-05-15 ENCOUNTER — Encounter (HOSPITAL_COMMUNITY): Payer: Medicare Other

## 2018-05-15 ENCOUNTER — Ambulatory Visit (HOSPITAL_COMMUNITY): Payer: Medicare Other

## 2018-05-18 ENCOUNTER — Ambulatory Visit (INDEPENDENT_AMBULATORY_CARE_PROVIDER_SITE_OTHER): Payer: Medicare Other | Admitting: Adult Health

## 2018-05-18 ENCOUNTER — Encounter (HOSPITAL_COMMUNITY): Payer: Medicare Other

## 2018-05-18 ENCOUNTER — Encounter: Payer: Self-pay | Admitting: Adult Health

## 2018-05-18 ENCOUNTER — Ambulatory Visit (HOSPITAL_COMMUNITY): Payer: Medicare Other

## 2018-05-18 VITALS — BP 144/72 | HR 64 | Ht 69.0 in | Wt 220.8 lb

## 2018-05-18 DIAGNOSIS — E119 Type 2 diabetes mellitus without complications: Secondary | ICD-10-CM | POA: Diagnosis not present

## 2018-05-18 DIAGNOSIS — I2510 Atherosclerotic heart disease of native coronary artery without angina pectoris: Secondary | ICD-10-CM

## 2018-05-18 DIAGNOSIS — Z79899 Other long term (current) drug therapy: Secondary | ICD-10-CM | POA: Diagnosis not present

## 2018-05-18 DIAGNOSIS — I10 Essential (primary) hypertension: Secondary | ICD-10-CM | POA: Diagnosis not present

## 2018-05-18 DIAGNOSIS — E7800 Pure hypercholesterolemia, unspecified: Secondary | ICD-10-CM

## 2018-05-18 LAB — HEPATIC FUNCTION PANEL
ALBUMIN: 4.8 g/dL (ref 3.5–4.8)
ALT: 33 IU/L (ref 0–44)
AST: 26 IU/L (ref 0–40)
Alkaline Phosphatase: 64 IU/L (ref 39–117)
BILIRUBIN TOTAL: 0.6 mg/dL (ref 0.0–1.2)
BILIRUBIN, DIRECT: 0.13 mg/dL (ref 0.00–0.40)
TOTAL PROTEIN: 7.6 g/dL (ref 6.0–8.5)

## 2018-05-18 LAB — LIPID PANEL
CHOL/HDL RATIO: 4 ratio (ref 0.0–5.0)
CHOLESTEROL TOTAL: 172 mg/dL (ref 100–199)
HDL: 43 mg/dL (ref >39)
LDL CALC: 96 mg/dL (ref 0–99)
Triglycerides: 164 mg/dL — ABNORMAL HIGH (ref 0–149)
VLDL CHOLESTEROL CAL: 33 mg/dL (ref 5–40)

## 2018-05-18 MED ORDER — LOSARTAN POTASSIUM 25 MG PO TABS: 25.0000 mg | ORAL_TABLET | Freq: Every day | ORAL | 1 refills | Status: DC

## 2018-05-18 MED ORDER — METOPROLOL SUCCINATE ER 25 MG PO TB24: 25.0000 mg | ORAL_TABLET | Freq: Every day | ORAL | 1 refills | Status: DC

## 2018-05-18 MED ORDER — SITAGLIPTIN PHOSPHATE 100 MG PO TABS: 100.0000 mg | ORAL_TABLET | Freq: Every day | ORAL | 3 refills | Status: DC

## 2018-05-18 NOTE — Addendum Note (Signed)
Addended by: Waylan Rocher on: 05/18/2018 09:31 AM   Modules accepted: Orders

## 2018-05-18 NOTE — Patient Instructions (Signed)
Medication Instructions:  START JANUVIA DAILY  If you need a refill on your cardiac medications before your next appointment, please call your pharmacy.  Labwork: LFT AND FLP TODAY HERE IN OUR OFFICE AT [facility]  Take the provided lab slips with you to the lab for your blood draw.   Special Instructions: REFER TO PHARMACY TO DISCUSS REPATHA  Follow-Up: Your physician wants you to follow-up in: [place] ([place]), DNP,AACC IF PRIMARY CARDIOLOGIST IS UNAVAILABLE.    Thank you for choosing [facility] at [facility]!!

## 2018-05-19 ENCOUNTER — Ambulatory Visit (INDEPENDENT_AMBULATORY_CARE_PROVIDER_SITE_OTHER): Payer: Medicare Other | Admitting: Pharmacist

## 2018-05-19 DIAGNOSIS — I2510 Atherosclerotic heart disease of native coronary artery without angina pectoris: Secondary | ICD-10-CM | POA: Diagnosis not present

## 2018-05-19 MED ORDER — ICOSAPENT ETHYL 1 G PO CAPS: 2.0000 g | ORAL_CAPSULE | Freq: Two times a day (BID) | ORAL | 1 refills | Status: DC

## 2018-05-19 NOTE — Progress Notes (Signed)
Patient ID: Gregory Mathews.                 DOB: August 12, 1946                    MRN: [id number]     HPI: Gregory Mathews. is a 72 y.o. male patient of Dr. Ellyn Mathews referred to lipid clinic by Gregory Long NP. PMH is significant for CAD s/p stent placement in 11/12/2017, hyperlipidemia, DM, and hypertension.  Noted patient is intolerant to pravastatin and rosuvastatin in the past. Statins caused lack or energy, severe muscle pain and and general malaise.  Patient "dislike" taking medication and question need to take cholesterol medication vs further intervention to place additional stents.   Current Medications: none  Intolerances:  Pravastatin 65m 03/2013 - 11/2017 - lack of energy and severe muscle pain Rosuvastatin 187m2/08/2018 - 11/20/2017 (2 weeks)  LDL goal: <7071mL  Diet: eats twice daily, eggs,sausage,and fruits for breakfast; chicken (fried or broil), occassional pizza, salads, steak and pork chops.   Exercise[facility] cardiac [facility] and resistance 5x/week  Family History: family history includes Cancer in his brother and maternal grandmother; Cancer (age of onset: 65)36n his father; Colon cancer (age of onset: 65)50n his father; Congestive Heart Failure in his brother; Coronary artery disease (age of onset: 72)34n his father; Diabetes in his brother and sister; Diabetes (age of onset: 64)27n his brother; Heart attack (age of onset: 81)71n his brother; Leukemia in his mother; Peripheral Artery Disease in his father; Rheumatic fever in his brother; Stroke in his maternal grandfather; Stroke (age of onset: 72)78n his father; Sudden Cardiac Death (age of onset: 75)74n his sister.   Social History: reports that he quit smoking about 35 years ago. He has a 15.00 pack-year smoking history. He has never used smokeless tobacco. He reports that he does not drink alcohol or use drugs  Labs: 05/18/2018: CHO 172; TG 164; HDL 43; LDL-c 96 (krill oil)  Past Medical  History:  Diagnosis Date  . Allergy   . Basal cell carcinoma 01/2013   Nasal and R forearm.  [facility]  . CAD S/P percutaneous coronary angioplasty    2/19 PCI/DESx1 to mLAD, FFR 0.7 -Sierra DES 2.75 mm x 18 mm  . Cataract   . Diabetes mellitus without complication (HCC)    on PO Meds  . Essential hypertension   . Hyperlipidemia    statin intolerant -- myalgias, fatigue (tried at least 4)  . Rosacea     Current Outpatient Medications on File Prior to Visit  Medication Sig Dispense Refill  . aspirin 81 MG tablet Take 81 mg by mouth every other day.     . blood glucose meter kit and supplies Use to test blood sugar daily. Dx: E11.9 1 each 0  . clopidogrel (PLAVIX) 75 MG tablet Take 1 tablet (75 mg total) by mouth daily. 30 tablet 5  . glipiZIDE (GLUCOTROL) 5 MG tablet Take 10 mg by mouth daily before breakfast.     . Lancets MISC Use to test blood sugar daily. Dx code :E11.9 100 each 3  . losartan (COZAAR) 25 MG tablet Take 1 tablet (25 mg total) by mouth daily. 90 tablet 1  . metoprolol succinate (TOPROL-XL) 25 MG 24 hr tablet Take 1 tablet (25 mg total) by mouth daily. 90 tablet 1  . Multiple Vitamin (MULTIVITAMIN) tablet Take 1 tablet by mouth daily.    .Marland Kitchen  nitroGLYCERIN (NITROSTAT) 0.4 MG SL tablet Place 1 tablet (0.4 mg total) under the tongue every 5 (five) minutes x 3 doses as needed for chest pain. 25 tablet 3  . ONE TOUCH ULTRA TEST test strip TEST BLOOD SUGAR DAILY 100 each 2  . sitaGLIPtin (JANUVIA) 100 MG tablet Take 1 tablet (100 mg total) by mouth daily. (Patient not taking: Reported on 05/20/2018) 30 tablet 3   No current facility-administered medications on file prior to visit.     Allergies  Allergen Reactions  . Lisinopril Cough  . Statins Other (See Comments)    Severe leg pain & fatigue -- tried at least 4 (even low dose)    Coronary artery disease involving native coronary artery of native heart without angina pectoris Patient LDL remains above  goal for secondary prevention but he is against initiating PCSK9i at this time. We discussed Repatha and Praleunt indication, MOA, common side effect and prior-authorization process. He still reluctant to any mediation that may cause "side effects" and insist on trying high dose omega-3 for 2-3 months before considering Repatha/Pralunet.  Will initiate Vascepa 10m twice daily due to cardiovascular advantages. Patient aware of medication cost and potential for insurance to denied payment.   Plan to try lifestyle modification and high dose Vascepa for 3 months, and then repeat fasting lipid panel to re-assess need for Repatha.     Gregory Mathews PharmD, BCPS, [id number]/29/2019 10:32 AM

## 2018-05-19 NOTE — Patient Instructions (Signed)
Lipid Clinic (pharmacist) Amiracle Neises/Kristin [phone]  *START Vascepa 2g twice daily if tolerating* *START paperwork for Repatha* *REPEAT fasting blood work in 3 months*  Cholesterol Cholesterol is a fat. Your body needs a small amount of cholesterol. Cholesterol (plaque) may build up in your blood vessels (arteries). That makes you more likely to have a heart attack or stroke. You cannot feel your cholesterol level. Having a blood test is the only way to find out if your level is high. Keep your test results. Work with your doctor to keep your cholesterol at a good level. What do the results mean?  Total cholesterol is how much cholesterol is in your blood.  LDL is bad cholesterol. This is the type that can build up. Try to have low LDL.  HDL is good cholesterol. It cleans your blood vessels and carries LDL away. Try to have high HDL.  Triglycerides are fat that the body can store or burn for energy. What are good levels of cholesterol?  Total cholesterol below 200.  LDL below 100 is good for people who have health risks. LDL below 70 is good for people who have very high risks.  HDL above 40 is good. It is best to have HDL of 60 or higher.  Triglycerides below 150. How can I lower my cholesterol? Diet Follow your diet program as told by your doctor.  Choose fish, white meat chicken, or [place] that is roasted or baked. Try not to eat red meat, fried foods, sausage, or lunch meats.  Eat lots of fresh fruits and vegetables.  Choose whole grains, beans, pasta, potatoes, and cereals.  Choose olive oil, corn oil, or canola oil. Only use small amounts.  Try not to eat butter, mayonnaise, shortening, or palm kernel oils.  Try not to eat foods with trans fats.  Choose low-fat or nonfat dairy foods. ? Drink skim or nonfat milk. ? Eat low-fat or nonfat yogurt and cheeses. ? Try not to drink whole milk or cream. ? Try not to eat ice cream, egg yolks, or full-fat  cheeses.  Healthy desserts include angel food cake, ginger snaps, animal crackers, hard candy, popsicles, and low-fat or nonfat frozen yogurt. Try not to eat pastries, cakes, pies, and cookies.  Exercise Follow your exercise program as told by your doctor.  Be more active. Try gardening, walking, and taking the stairs.  Ask your doctor about ways that you can be more active.  Medicine  Take over-the-counter and prescription medicines only as told by your doctor. This information is not intended to replace advice given to you by your health care provider. Make sure you discuss any questions you have with your health care provider. Document Released: 12/06/2008 Document Revised: 04/10/2016 Document Reviewed: 03/21/2016 Elsevier Interactive Patient Education  [employer].

## 2018-05-20 ENCOUNTER — Encounter (HOSPITAL_COMMUNITY)
Admission: RE | Admit: 2018-05-20 | Discharge: 2018-05-20 | Disposition: A | Discharge status: Home / Self Care | Payer: Medicare Other | Source: Ambulatory Visit | Attending: Cardiology | Admitting: Cardiology

## 2018-05-20 ENCOUNTER — Ambulatory Visit (HOSPITAL_COMMUNITY): Payer: Medicare Other

## 2018-05-20 ENCOUNTER — Encounter (HOSPITAL_COMMUNITY): Payer: Medicare Other

## 2018-05-20 VITALS — BP 120/62 | HR 76 | Wt 217.2 lb

## 2018-05-20 DIAGNOSIS — Z955 Presence of coronary angioplasty implant and graft: Secondary | ICD-10-CM

## 2018-05-21 ENCOUNTER — Encounter: Payer: Self-pay | Admitting: Pharmacist

## 2018-05-21 NOTE — Assessment & Plan Note (Signed)
Patient LDL remains above goal for secondary prevention but he is against initiating PCSK9i at this time. We discussed Repatha and Praleunt indication, MOA, common side effect and prior-authorization process. He still reluctant to any mediation that may cause "side effects" and insist on trying high dose omega-3 for 2-3 months before considering Repatha/Pralunet.  Will initiate Vascepa 2mg  twice daily due to cardiovascular advantages. Patient aware of medication cost and potential for insurance to denied payment.   Plan to try lifestyle modification and high dose Vascepa for 3 months, and then repeat fasting lipid panel to re-assess need for Repatha.

## 2018-05-22 ENCOUNTER — Ambulatory Visit (HOSPITAL_COMMUNITY): Payer: Medicare Other

## 2018-05-22 ENCOUNTER — Encounter (HOSPITAL_COMMUNITY): Payer: Medicare Other

## 2018-05-27 ENCOUNTER — Encounter (HOSPITAL_COMMUNITY): Payer: Medicare Other

## 2018-05-27 ENCOUNTER — Ambulatory Visit (HOSPITAL_COMMUNITY): Payer: Medicare Other

## 2018-05-27 NOTE — Telephone Encounter (Signed)
Left message requesting a call back with instructions to leave specific questions with our call center so that I can address his concerns.

## 2018-05-28 ENCOUNTER — Ambulatory Visit (INDEPENDENT_AMBULATORY_CARE_PROVIDER_SITE_OTHER): Payer: Self-pay | Admitting: Urgent Care

## 2018-05-28 ENCOUNTER — Encounter: Payer: Self-pay | Admitting: Urgent Care

## 2018-05-28 VITALS — BP 160/62 | HR 71 | Temp 97.6°F | Resp 18 | Ht 69.0 in | Wt 220.0 lb

## 2018-05-28 DIAGNOSIS — I2510 Atherosclerotic heart disease of native coronary artery without angina pectoris: Secondary | ICD-10-CM

## 2018-05-28 DIAGNOSIS — E119 Type 2 diabetes mellitus without complications: Secondary | ICD-10-CM

## 2018-05-28 DIAGNOSIS — Z024 Encounter for examination for driving license: Secondary | ICD-10-CM

## 2018-05-28 DIAGNOSIS — R030 Elevated blood-pressure reading, without diagnosis of hypertension: Secondary | ICD-10-CM

## 2018-05-28 DIAGNOSIS — Z955 Presence of coronary angioplasty implant and graft: Secondary | ICD-10-CM

## 2018-05-28 DIAGNOSIS — I10 Essential (primary) hypertension: Secondary | ICD-10-CM

## 2018-05-28 NOTE — Patient Instructions (Addendum)
Please check your blood pressure once weekly. Try to check the blood pressure around the same time each day that you check it after a period of resting in a seated position for 5-10 minutes. The readings should be between 462-703 systolic (top number), between 50-09 diastolic (bottom number).     Managing Your Hypertension Hypertension is commonly called high blood pressure. This is when the force of your blood pressing against the walls of your arteries is too strong. Arteries are blood vessels that carry blood from your heart throughout your body. Hypertension forces the heart to work harder to pump blood, and may cause the arteries to become narrow or stiff. Having untreated or uncontrolled hypertension can cause heart attack, stroke, kidney disease, and other problems. What are blood pressure readings? A blood pressure reading consists of a higher number over a lower number. Ideally, your blood pressure should be below 120/80. The first ("top") number is called the systolic pressure. It is a measure of the pressure in your arteries as your heart beats. The second ("bottom") number is called the diastolic pressure. It is a measure of the pressure in your arteries as the heart relaxes. What does my blood pressure reading mean? Blood pressure is classified into four stages. Based on your blood pressure reading, your health care provider may use the following stages to determine what type of treatment you need, if any. Systolic pressure and diastolic pressure are measured in a unit called mm Hg. Normal  Systolic pressure: below 381.  Diastolic pressure: below 80. Elevated  Systolic pressure: 829-937.  Diastolic pressure: below 80. Hypertension stage 1  Systolic pressure: 169-678.  Diastolic pressure: 93-81. Hypertension stage 2  Systolic pressure: 017 or above.  Diastolic pressure: 90 or above. What health risks are associated with hypertension? Managing your hypertension is an  important responsibility. Uncontrolled hypertension can lead to:  A heart attack.  A stroke.  A weakened blood vessel (aneurysm).  Heart failure.  Kidney damage.  Eye damage.  Metabolic syndrome.  Memory and concentration problems.  What changes can I make to manage my hypertension? Hypertension can be managed by making lifestyle changes and possibly by taking medicines. Your health care provider will help you make a plan to bring your blood pressure within a normal range. Eating and drinking  Eat a diet that is high in fiber and potassium, and low in salt (sodium), added sugar, and fat. An example eating plan is called the DASH (Dietary Approaches to Stop Hypertension) diet. To eat this way: ? Eat plenty of fresh fruits and vegetables. Try to fill half of your plate at each meal with fruits and vegetables. ? Eat whole grains, such as whole wheat pasta, brown rice, or whole grain bread. Fill about one quarter of your plate with whole grains. ? Eat low-fat diary products. ? Avoid fatty cuts of meat, processed or cured meats, and poultry with skin. Fill about one quarter of your plate with lean proteins such as fish, chicken without skin, beans, eggs, and tofu. ? Avoid premade and processed foods. These tend to be higher in sodium, added sugar, and fat.  Reduce your daily sodium intake. Most people with hypertension should eat less than 1,500 mg of sodium a day.  Limit alcohol intake to no more than 1 drink a day for nonpregnant women and 2 drinks a day for men. One drink equals 12 oz of beer, 5 oz of wine, or 1 oz of hard liquor. Lifestyle  Work with your  health care provider to maintain a healthy body weight, or to lose weight. Ask what an ideal weight is for you.  Get at least 30 minutes of exercise that causes your heart to beat faster (aerobic exercise) most days of the week. Activities may include walking, swimming, or biking.  Include exercise to strengthen your muscles  (resistance exercise), such as weight lifting, as part of your weekly exercise routine. Try to do these types of exercises for 30 minutes at least 3 days a week.  Do not use any products that contain nicotine or tobacco, such as cigarettes and e-cigarettes. If you need help quitting, ask your health care provider.  Control any long-term (chronic) conditions you have, such as high cholesterol or diabetes. Monitoring  Monitor your blood pressure at home as told by your health care provider. Your personal target blood pressure may vary depending on your medical conditions, your age, and other factors.  Have your blood pressure checked regularly, as often as told by your health care provider. Working with your health care provider  Review all the medicines you take with your health care provider because there may be side effects or interactions.  Talk with your health care provider about your diet, exercise habits, and other lifestyle factors that may be contributing to hypertension.  Visit your health care provider regularly. Your health care provider can help you create and adjust your plan for managing hypertension. Will I need medicine to control my blood pressure? Your health care provider may prescribe medicine if lifestyle changes are not enough to get your blood pressure under control, and if:  Your systolic blood pressure is 130 or higher.  Your diastolic blood pressure is 80 or higher.  Take medicines only as told by your health care provider. Follow the directions carefully. Blood pressure medicines must be taken as prescribed. The medicine does not work as well when you skip doses. Skipping doses also puts you at risk for problems. Contact a health care provider if:  You think you are having a reaction to medicines you have taken.  You have repeated (recurrent) headaches.  You feel dizzy.  You have swelling in your ankles.  You have trouble with your vision. Get help right  away if:  You develop a severe headache or confusion.  You have unusual weakness or numbness, or you feel faint.  You have severe pain in your chest or abdomen.  You vomit repeatedly.  You have trouble breathing. Summary  Hypertension is when the force of blood pumping through your arteries is too strong. If this condition is not controlled, it may put you at risk for serious complications.  Your personal target blood pressure may vary depending on your medical conditions, your age, and other factors. For most people, a normal blood pressure is less than 120/80.  Hypertension is managed by lifestyle changes, medicines, or both. Lifestyle changes include weight loss, eating a healthy, low-sodium diet, exercising more, and limiting alcohol. This information is not intended to replace advice given to you by your health care provider. Make sure you discuss any questions you have with your health care provider. Document Released: 06/03/2012 Document Revised: 08/07/2016 Document Reviewed: 08/07/2016 Elsevier Interactive Patient Education  [employer].    If you have lab work done today you will be contacted with your lab results within the next 2 weeks.  If you have not heard from [place] then please contact us. The fastest way to get your results is to register  for My Chart.   IF you received an x-ray today, you will receive an invoice from [facility]. Please contact [facility] at [phone] with questions or concerns regarding your invoice.   IF you received labwork today, you will receive an invoice from [place]. Please contact [facility] at [phone] with questions or concerns regarding your invoice.   Our billing staff will not be able to assist you with questions regarding bills from these companies.  You will be contacted with the lab results as soon as they are available. The fastest way to get your results is to activate your My Chart account.  Instructions are located on the last page of this paperwork. If you have not heard from [place] regarding the results in 2 weeks, please contact this office.

## 2018-05-28 NOTE — Progress Notes (Signed)
    MRN: [id number] DOB: 11-21-1945  Subjective:   Gregory Mathews. is a 73 y.o. 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  He did end up getting his stress test with his cardiologist and turned out to be normal this past month.  His blood pressure remains elevated today.  He reports compliance.  Gregory Mathews has a current medication list which includes the following prescription(s): aspirin, blood glucose meter kit and supplies, clopidogrel, glipizide, icosapent ethyl, lancets, losartan, metoprolol succinate, multivitamin, nitroglycerin, one touch ultra test, and sitagliptin. Also is allergic to lisinopril and statins.  Gregory Mathews  has a past medical history of Allergy, Basal cell carcinoma (01/2013), CAD S/P percutaneous coronary angioplasty, Cataract, Diabetes mellitus without complication ([place]), Essential hypertension, Hyperlipidemia, and Rosacea. Also  has a past surgical history that includes Shoulder open rotator cuff repair (1983); Blepharoptosis repair; eyelid surgery; basal cell removal; Eye surgery; ETT/GXT: Graded Exercise Tolerance Test (08/2015); transthoracic echocardiogram (08/2017); CORONARY CT ANGIOGRAM (09/2017); LEFT HEART CATH AND CORONARY ANGIOGRAPHY (N/A, 11/12/2017); INTRAVASCULAR PRESSURE WIRE/FFR STUDY (N/A, 11/12/2017); and CORONARY STENT INTERVENTION (N/A, 11/12/2017).  Objective:   Vitals: BP (!) 151/69   Pulse 71   Temp 97.6 F (36.4 C) (Oral)   Resp 18   SpO2 98%   BP Readings from Last 3 Encounters:  05/28/18 (!) 151/69  05/20/18 120/62  05/18/18 (!) 144/72    Physical Exam  Constitutional: He is oriented to person, place, and time. He appears well-developed and well-nourished.  Cardiovascular: Normal rate.  Pulmonary/Chest: Effort normal.  Neurological: He is alert and oriented to person, place, and time.    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  From cardiology standpoint patient is cleared.  Follow-up and 6 to 8 weeks.  Jaynee Eagles, PA-C [facility] [phone] 05/28/2018 10:34 AM

## 2018-05-29 ENCOUNTER — Encounter (HOSPITAL_COMMUNITY): Payer: Medicare Other

## 2018-05-29 ENCOUNTER — Ambulatory Visit (HOSPITAL_COMMUNITY): Payer: Medicare Other

## 2018-06-01 ENCOUNTER — Ambulatory Visit (HOSPITAL_COMMUNITY): Payer: Medicare Other

## 2018-06-01 ENCOUNTER — Encounter (HOSPITAL_COMMUNITY): Payer: Medicare Other

## 2018-06-03 ENCOUNTER — Ambulatory Visit (HOSPITAL_COMMUNITY): Payer: Medicare Other

## 2018-06-03 ENCOUNTER — Encounter (HOSPITAL_COMMUNITY): Payer: Medicare Other

## 2018-06-05 ENCOUNTER — Ambulatory Visit (HOSPITAL_COMMUNITY): Payer: Medicare Other

## 2018-06-05 ENCOUNTER — Encounter (HOSPITAL_COMMUNITY): Payer: Medicare Other

## 2018-06-05 ENCOUNTER — Other Ambulatory Visit: Payer: Self-pay | Admitting: Cardiology

## 2018-06-08 ENCOUNTER — Encounter (HOSPITAL_COMMUNITY): Payer: Medicare Other

## 2018-06-08 ENCOUNTER — Ambulatory Visit (HOSPITAL_COMMUNITY): Payer: Medicare Other

## 2018-06-10 ENCOUNTER — Encounter (HOSPITAL_COMMUNITY): Payer: Medicare Other

## 2018-06-10 ENCOUNTER — Ambulatory Visit (HOSPITAL_COMMUNITY): Payer: Medicare Other

## 2018-06-12 ENCOUNTER — Other Ambulatory Visit: Payer: Self-pay

## 2018-06-12 ENCOUNTER — Ambulatory Visit (HOSPITAL_COMMUNITY): Payer: Medicare Other

## 2018-06-12 ENCOUNTER — Encounter (HOSPITAL_COMMUNITY): Payer: Medicare Other

## 2018-06-12 ENCOUNTER — Encounter: Payer: Self-pay | Admitting: Emergency Medicine

## 2018-06-12 ENCOUNTER — Ambulatory Visit (INDEPENDENT_AMBULATORY_CARE_PROVIDER_SITE_OTHER): Payer: Medicare Other | Admitting: Emergency Medicine

## 2018-06-12 VITALS — BP 138/72 | HR 74 | Temp 97.7°F | Resp 16 | Ht 69.0 in | Wt 219.0 lb

## 2018-06-12 DIAGNOSIS — Z23 Encounter for immunization: Secondary | ICD-10-CM | POA: Diagnosis not present

## 2018-06-12 DIAGNOSIS — I10 Essential (primary) hypertension: Secondary | ICD-10-CM | POA: Diagnosis not present

## 2018-06-12 DIAGNOSIS — E119 Type 2 diabetes mellitus without complications: Secondary | ICD-10-CM

## 2018-06-12 LAB — POCT GLYCOSYLATED HEMOGLOBIN (HGB A1C): HEMOGLOBIN A1C: 7.1 % — AB (ref 4.0–5.6)

## 2018-06-12 LAB — GLUCOSE, POCT (MANUAL RESULT ENTRY): POC GLUCOSE: 105 mg/dL — AB (ref 70–99)

## 2018-06-12 MED ORDER — GLIPIZIDE 5 MG PO TABS: 5.0000 mg | ORAL_TABLET | Freq: Two times a day (BID) | ORAL | 3 refills | Status: DC

## 2018-06-12 MED ORDER — SITAGLIPTIN PHOSPHATE 100 MG PO TABS: 100.0000 mg | ORAL_TABLET | Freq: Every day | ORAL | 3 refills | Status: DC

## 2018-06-12 NOTE — Progress Notes (Signed)
Lab Results  Component Value Date   HGBA1C 7.3 (A) 03/11/2018   BP Readings from Last 3 Encounters:  05/28/18 (!) 160/62  05/20/18 120/62  05/18/18 (!) 144/72   Gregory Schaus Jr. 72 y.o.   Chief Complaint  Patient presents with  . Diabetes    3 month follow-up     HISTORY OF PRESENT ILLNESS: This is a 72 y.o. male with history of diabetes here for 61-monthfollow-up.  Unable to take metformin.  Presently taking glipizide twice a day and Januvia once a day.  Was also started on omega 3 recently for lipid control.  Feels fine and has no complaints today.  HPI   Prior to Admission medications   Medication Sig Start Date End Date Taking? Authorizing Provider  aspirin 81 MG tablet Take 81 mg by mouth every other day.    Yes [provider]  blood glucose meter kit and supplies Use to test blood sugar daily. Dx: E11.9 10/11/15  Yes CTereasa Coop PA-C  clopidogrel (PLAVIX) 75 MG tablet TAKE 1 TABLET BY MOUTH EVERY DAY 06/05/18  Yes HLeonie Man MD  glipiZIDE (GLUCOTROL) 5 MG tablet Take 10 mg by mouth daily before breakfast.    Yes [provider]  Icosapent Ethyl 1 g CAPS Take 2 capsules (2 g total) by mouth 2 (two) times daily. 05/19/18  Yes HLeonie Man MD  Lancets MISC Use to test blood sugar daily. Dx code :E11.9 10/11/15  Yes CTereasa Coop PA-C  losartan (COZAAR) 25 MG tablet Take 1 tablet (25 mg total) by mouth daily. 05/18/18  Yes LLendon Colonel NP  metoprolol succinate (TOPROL-XL) 25 MG 24 hr tablet Take 1 tablet (25 mg total) by mouth daily. 05/18/18  Yes LLendon Colonel NP  Multiple Vitamin (MULTIVITAMIN) tablet Take 1 tablet by mouth daily.   Yes [provider]  nitroGLYCERIN (NITROSTAT) 0.4 MG SL tablet Place 1 tablet (0.4 mg total) under the tongue every 5 (five) minutes x 3 doses as needed for chest pain. 01/19/18  Yes HLeonie Man MD  ONE TOUCH ULTRA TEST test strip TEST BLOOD SUGAR DAILY 02/25/18  Yes CTereasa Coop PA-C  sitaGLIPtin (JANUVIA) 100 MG tablet Take 1 tablet (100 mg total) by mouth daily. 05/18/18 08/16/18 Yes LLendon Colonel NP    Allergies  Allergen Reactions  . Lisinopril Cough  . Statins Other (See Comments)    Severe leg pain & fatigue -- tried at least 4 (even low dose)    Patient Active Problem List   Diagnosis Date Noted  . Uncircumcised male 03/11/2018  . Coronary artery disease involving native coronary artery of native heart without angina pectoris 11/04/2017  . Abnormal findings on diagnostic imaging of cardiovascular system 11/04/2017  . Pre-op testing 09/22/2017  . Agatston coronary artery calcium score between 100 and 199 09/22/2017  . Family history of early CAD 09/11/2017  . Mild aortic stenosis by prior echocardiogram 09/11/2017  . Metabolic syndrome 191/66/0600 . 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 BMI 31.0-31.9,adult 03/13/2016  . Atypical chest pain 08/29/2015  . Essential hypertension 08/29/2015  . Diverticulosis of colon without hemorrhage 08/01/2015  . Statin intolerance 08/01/2015  . Basal cell carcinoma of nose 08/23/2014  . Rosacea 08/23/2014  . Seasonal allergies 01/26/2013  . Diabetes mellitus type 2, uncomplicated ([facility] 145/99/7741 . Hyperlipidemia associated with type 2 diabetes mellitus ([facility] 07/09/2012  . Erectile dysfunction 07/09/2012    Past Medical History:  Diagnosis  Date  . Allergy   . Basal cell carcinoma 01/2013   Nasal and R forearm.  [facility]  . CAD S/P percutaneous coronary angioplasty    2/19 PCI/DESx1 to mLAD, FFR 0.7 -Sierra DES 2.75 mm x 18 mm  . Cataract   . Diabetes mellitus without complication (HCC)    on PO Meds  . Essential hypertension   . Hyperlipidemia    statin intolerant -- myalgias, fatigue (tried at least 4)  . Rosacea     Past Surgical History:  Procedure Laterality Date  . basal cell removal     nose and wrist   . BELPHAROPTOSIS REPAIR    . CORONARY CT ANGIOGRAM  09/2017    Coronary Calcium Score: 111. Coronary calcification noted in the LEFT MAIN (LM) and prox LAD.  LM < 50% calcified stenosis.  Mid LAD 50-75% plaque noted CT FFR --> positive at 0.80.  Recommend CARDIAC CATHETERIZATION  . CORONARY STENT INTERVENTION N/A 11/12/2017   Procedure: CORONARY STENT INTERVENTION;  Surgeon: Leonie Man, MD;  Location: [facility];  Service: Cardiovascular:  DES PCI:  STENT SIERRA 2.75 X 18 MM - Post intervention, there is a 0% residual stenosis.  Marland Kitchen ETT/GXT: Graded Exercise Tolerance Test  08/2015    Exercised for 9:01 min --> blunted blood pressure response no EKG changes.  No chest pain.  Low Risk  . EYE SURGERY     Cataract surgery  . eyelid surgery    . INTRAVASCULAR PRESSURE WIRE/FFR STUDY N/A 11/12/2017   Procedure: INTRAVASCULAR PRESSURE WIRE/FFR STUDY;  Surgeon: Leonie Man, MD;  Location: [facility];  Service: Cardiovascular;  Laterality: LAD -FFR 0.76  . LEFT HEART CATH AND CORONARY ANGIOGRAPHY N/A 11/12/2017   Procedure: LEFT HEART CATH AND CORONARY ANGIOGRAPHY;  Surgeon: Leonie Man, MD;  Location: [facility]: 70% P-M LAD (FFR 0.76).  40% mid LAD, 50% ostial ramus.  EF 55-60%.  Mildly elevated LVEDP.  Marland Kitchen SHOULDER OPEN ROTATOR CUFF REPAIR  1983  . TRANSTHORACIC ECHOCARDIOGRAM  08/2017   EF 55-60%.  Mild aortic stenosis (mean gradient 11 mmH.)  GR 1 DD.  Mild LA dilation.    Social History   Socioeconomic History  . Marital status: Married    Spouse name: [name]  . Number of children: 4  . Years of education: 9  . Highest education level: Not on file  Occupational History  . Occupation: bus Engineer, manufacturing systems: [employer]  . Financial resource strain: Not on file  . Food insecurity:    Worry: Not on file    Inability: Not on file  . Transportation needs:    Medical: Not on file    Non-medical: Not on file  Tobacco Use  . Smoking status: Former Smoker    Packs/day: 1.00     Years: 15.00    Pack years: 15.00    Last attempt to quit: 07/09/1982    Years since quitting: 35.9  . Smokeless tobacco: Never Used  Substance and Sexual Activity  . Alcohol use: No  . Drug use: No  . Sexual activity: Yes    Partners: Female  Lifestyle  . Physical activity:    Days per week: Not on file    Minutes per session: Not on file  . Stress: Not on file  Relationships  . Social connections:    Talks on phone: Not on file    Gets together: Not on  file    Attends religious service: Not on file    Active member of club or organization: Not on file    Attends meetings of clubs or organizations: Not on file    Relationship status: Not on file  . Intimate partner violence:    Fear of current or ex partner: Not on file    Emotionally abused: Not on file    Physically abused: Not on file    Forced sexual activity: Not on file  Other Topics Concern  . Not on file  Social History Narrative   Marital status: married x 63 years; happily married.       Children:  4 children; 4 grandchildren. Adult children all live locally.      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      Tobacco:  In past; smoked x 15 years; quit 35 years ago.      Alcohol: none      Exercise:sporadic in 2017.      ADLs: independent with all ADLs.  Does not use assistant device with ambulation.      Living Will:  Has living will; desires FULL CODE; no prolonged measures.      Doctoral degree in Biblical Studies.        Family History  Problem Relation Age of Onset  . Leukemia Mother   . Stroke Father 43       as a complication of CABG  . Colon cancer Father 32  . Cancer Father 32       Colon cancer  . Coronary artery disease Father 35       - Sx was DOE - MV CAD  . Peripheral Artery Disease Father        presumptive  . Diabetes Sister   . Sudden Cardiac Death Sister 49       presumed MI  . Heart attack  Brother 81       During throat surgery  . Congestive Heart Failure Brother        Died @ 28 - ? CHF / ICM  . Cancer Maternal Grandmother   . Stroke Maternal Grandfather   . Diabetes Brother 64       on lots of meds  . Diabetes Brother   . Cancer Brother        colon cancer  . Rheumatic fever Brother        childhood RF --> Valvular Dz - valve Sgx,  died young     Review of Systems  Constitutional: Negative.  Negative for chills and fever.  HENT: Negative.   Eyes: Negative.  Negative for blurred vision and double vision.  Respiratory: Negative.  Negative for cough and shortness of breath.   Cardiovascular: Negative.  Negative for chest pain and palpitations.  Gastrointestinal: Negative.  Negative for abdominal pain, diarrhea, nausea and vomiting.  Genitourinary: Negative.  Negative for dysuria.  Musculoskeletal: Negative.   Skin: Negative.  Negative for rash.  Neurological: Negative.  Negative for dizziness and headaches.  Endo/Heme/Allergies: Negative.   All other systems reviewed and are negative.  Vitals:   06/12/18 1528  BP: 138/72  Pulse: 74  Resp: 16  Temp: 97.7 F (36.5 C)  SpO2: 97%     Physical Exam  Constitutional: He is oriented to person, place, and time. He appears well-developed and well-nourished.  HENT:  Head: Normocephalic and atraumatic.  Nose: Nose normal.  Mouth/Throat: Oropharynx is clear and moist.  Eyes: Pupils are equal, round, and reactive to light. Conjunctivae and EOM are normal.  Neck: Normal range of motion. Neck supple. No thyromegaly present.  Cardiovascular: Normal rate, regular rhythm and normal heart sounds.  Pulmonary/Chest: Effort normal and breath sounds normal.  Musculoskeletal: Normal range of motion.  Lymphadenopathy:    He has no cervical adenopathy.  Neurological: He is alert and oriented to person, place, and time. No sensory deficit. He exhibits normal muscle tone.  Skin: Skin is warm and dry. Capillary refill takes  less than 2 seconds.  Psychiatric: He has a normal mood and affect. His behavior is normal.  Vitals reviewed.  Results for orders placed or performed in visit on 06/12/18 (from the past 24 hour(s))  POCT glucose (manual entry)     Status: Abnormal   Collection Time: 06/12/18  3:43 PM  Result Value Ref Range   POC Glucose 105 (A) 70 - 99 mg/dl  POCT glycosylated hemoglobin (Hb A1C)     Status: Abnormal   Collection Time: 06/12/18  3:48 PM  Result Value Ref Range   Hemoglobin A1C 7.1 (A) 4.0 - 5.6 %   HbA1c POC (<> result, manual entry)     HbA1c, POC (prediabetic range)     HbA1c, POC (controlled diabetic range)     Diabetes mellitus type 2, uncomplicated (HCC) Hemoglobin A1c 7.1 today.  Has been a little erratic taking medications and was recently started on omega-3 for his triglycerides.  We will continue present medications, follow-up in 3 months, and adjust medications as needed then.    ASSESSMENT & PLAN: Gregory Mathews was seen today for diabetes.  Diagnoses and all orders for this visit:  Type 2 diabetes mellitus without complication, without long-term current use of insulin (HCC) -     POCT glucose (manual entry) -     POCT glycosylated hemoglobin (Hb A1C) -     sitaGLIPtin (JANUVIA) 100 MG tablet; Take 1 tablet (100 mg total) by mouth daily.  Need for prophylactic vaccination and inoculation against influenza -     Cancel: Flu Vaccine QUAD 36+ mos IM -     Flu vaccine HIGH DOSE PF (Fluzone High dose)  Essential hypertension  Other orders -     glipiZIDE (GLUCOTROL) 5 MG tablet; Take 1 tablet (5 mg total) by mouth 2 (two) times daily before a meal.    Patient Instructions       If you have lab work done today you will be contacted with your lab results within the next 2 weeks.  If you have not heard from [place] then please contact us. The fastest way to get your results is to register for My Chart.   IF you received an x-ray today, you will receive an invoice from  [facility]. Please contact [facility] at [phone] with questions or concerns regarding your invoice.   IF you received labwork today, you will receive an invoice from [facility]. Please contact [facility] at [phone] with questions or concerns regarding your invoice.   Our billing staff will not be able to assist you with questions regarding bills from these companies.  You will be contacted with the lab results as soon as they are available. The fastest way to get your results is to activate your My Chart account. Instructions are located on the last page of this paperwork. If you have not heard from [place] regarding the results in 2 weeks, please contact  this office.      Diabetes Mellitus and Nutrition When you have diabetes (diabetes mellitus), it is very important to have healthy eating habits because your blood sugar (glucose) levels are greatly affected by what you eat and drink. Eating healthy foods in the appropriate amounts, at about the same times every day, can help you:  Control your blood glucose.  Lower your risk of heart disease.  Improve your blood pressure.  Reach or maintain a healthy weight.  Every person with diabetes is different, and each person has different needs for a meal plan. Your health care provider may recommend that you work with a diet and nutrition specialist (dietitian) to make a meal plan that is best for you. Your meal plan may vary depending on factors such as:  The calories you need.  The medicines you take.  Your weight.  Your blood glucose, blood pressure, and cholesterol levels.  Your activity level.  Other health conditions you have, such as heart or kidney disease.  How do carbohydrates affect me? Carbohydrates affect your blood glucose level more than any other type of food. Eating carbohydrates naturally increases the amount of glucose in your blood. Carbohydrate counting is a method for keeping track of how many  carbohydrates you eat. Counting carbohydrates is important to keep your blood glucose at a healthy level, especially if you use insulin or take certain oral diabetes medicines. It is important to know how many carbohydrates you can safely have in each meal. This is different for every person. Your dietitian can help you calculate how many carbohydrates you should have at each meal and for snack. Foods that contain carbohydrates include:  Bread, cereal, rice, pasta, and crackers.  Potatoes and corn.  Peas, beans, and lentils.  Milk and yogurt.  Fruit and juice.  Desserts, such as cakes, cookies, ice cream, and candy.  How does alcohol affect me? Alcohol can cause a sudden decrease in blood glucose (hypoglycemia), especially if you use insulin or take certain oral diabetes medicines. Hypoglycemia can be a life-threatening condition. Symptoms of hypoglycemia (sleepiness, dizziness, and confusion) are similar to symptoms of having too much alcohol. If your health care provider says that alcohol is safe for you, follow these guidelines:  Limit alcohol intake to no more than 1 drink per day for nonpregnant women and 2 drinks per day for men. One drink equals 12 oz of beer, 5 oz of wine, or 1 oz of hard liquor.  Do not drink on an empty stomach.  Keep yourself hydrated with water, diet soda, or unsweetened iced tea.  Keep in mind that regular soda, juice, and other mixers may contain a lot of sugar and must be counted as carbohydrates.  What are tips for following this plan? Reading food labels  Start by checking the serving size on the label. The amount of calories, carbohydrates, fats, and other nutrients listed on the label are based on one serving of the food. Many foods contain more than one serving per package.  Check the total grams (g) of carbohydrates in one serving. You can calculate the number of servings of carbohydrates in one serving by dividing the total carbohydrates by 15.  For example, if a food has 30 g of total carbohydrates, it would be equal to 2 servings of carbohydrates.  Check the number of grams (g) of saturated and trans fats in one serving. Choose foods that have low or no amount of these fats.  Check the number of milligrams (  mg) of sodium in one serving. Most people should limit total sodium intake to less than 2,300 mg per day.  Always check the nutrition information of foods labeled as "low-fat" or "nonfat". These foods may be higher in added sugar or refined carbohydrates and should be avoided.  Talk to your dietitian to identify your daily goals for nutrients listed on the label. Shopping  Avoid buying canned, premade, or processed foods. These foods tend to be high in fat, sodium, and added sugar.  Shop around the outside edge of the grocery store. This includes fresh fruits and vegetables, bulk grains, fresh meats, and fresh dairy. Cooking  Use low-heat cooking methods, such as baking, instead of high-heat cooking methods like deep frying.  Cook using healthy oils, such as olive, canola, or sunflower oil.  Avoid cooking with butter, cream, or high-fat meats. Meal planning  Eat meals and snacks regularly, preferably at the same times every day. Avoid going long periods of time without eating.  Eat foods high in fiber, such as fresh fruits, vegetables, beans, and whole grains. Talk to your dietitian about how many servings of carbohydrates you can eat at each meal.  Eat 4-6 ounces of lean protein each day, such as lean meat, chicken, fish, eggs, or tofu. 1 ounce is equal to 1 ounce of meat, chicken, or fish, 1 egg, or 1/4 cup of tofu.  Eat some foods each day that contain healthy fats, such as avocado, nuts, seeds, and fish. Lifestyle   Check your blood glucose regularly.  Exercise at least 30 minutes 5 or more days each week, or as told by your health care provider.  Take medicines as told by your health care provider.  Do not  use any products that contain nicotine or tobacco, such as cigarettes and e-cigarettes. If you need help quitting, ask your health care provider.  Work with a Social worker or diabetes educator to identify strategies to manage stress and any emotional and social challenges. What are some questions to ask my health care provider?  Do I need to meet with a diabetes educator?  Do I need to meet with a dietitian?  What number can I call if I have questions?  When are the best times to check my blood glucose? Where to find more information:  American Diabetes Association: diabetes.org/food-and-fitness/food  Academy of Nutrition and Dietetics: PokerClues.dk  [employer] of Diabetes and Digestive and Kidney Diseases (NIH): ContactWire.be Summary  A healthy meal plan will help you control your blood glucose and maintain a healthy lifestyle.  Working with a diet and nutrition specialist (dietitian) can help you make a meal plan that is best for you.  Keep in mind that carbohydrates and alcohol have immediate effects on your blood glucose levels. It is important to count carbohydrates and to use alcohol carefully. This information is not intended to replace advice given to you by your health care provider. Make sure you discuss any questions you have with your health care provider. Document Released: 06/06/2005 Document Revised: 10/14/2016 Document Reviewed: 10/14/2016 Elsevier Interactive Patient Education  2018 [employer].      Agustina Caroli, MD [facility]

## 2018-06-12 NOTE — Assessment & Plan Note (Signed)
Hemoglobin A1c 7.1 today.  Has been a little erratic taking medications and was recently started on omega-3 for his triglycerides.  We will continue present medications, follow-up in 3 months, and adjust medications as needed then.

## 2018-06-12 NOTE — Patient Instructions (Addendum)
   If you have lab work done today you will be contacted with your lab results within the next 2 weeks.  If you have not heard from us then please contact us. The fastest way to get your results is to register for My Chart.   IF you received an x-ray today, you will receive an invoice from [facility]. Please contact [facility] at [phone] with questions or concerns regarding your invoice.   IF you received labwork today, you will receive an invoice from [facility]. Please contact [facility] at [phone] with questions or concerns regarding your invoice.   Our billing staff will not be able to assist you with questions regarding bills from these companies.  You will be contacted with the lab results as soon as they are available. The fastest way to get your results is to activate your My Chart account. Instructions are located on the last page of this paperwork. If you have not heard from us regarding the results in 2 weeks, please contact this office.     Diabetes Mellitus and Nutrition When you have diabetes (diabetes mellitus), it is very important to have healthy eating habits because your blood sugar (glucose) levels are greatly affected by what you eat and drink. Eating healthy foods in the appropriate amounts, at about the same times every day, can help you:  Control your blood glucose.  Lower your risk of heart disease.  Improve your blood pressure.  Reach or maintain a healthy weight.  Every person with diabetes is different, and each person has different needs for a meal plan. Your health care provider may recommend that you work with a diet and nutrition specialist (dietitian) to make a meal plan that is best for you. Your meal plan may vary depending on factors such as:  The calories you need.  The medicines you take.  Your weight.  Your blood glucose, blood pressure, and cholesterol levels.  Your activity level.  Other health conditions you  have, such as heart or kidney disease.  How do carbohydrates affect me? Carbohydrates affect your blood glucose level more than any other type of food. Eating carbohydrates naturally increases the amount of glucose in your blood. Carbohydrate counting is a method for keeping track of how many carbohydrates you eat. Counting carbohydrates is important to keep your blood glucose at a healthy level, especially if you use insulin or take certain oral diabetes medicines. It is important to know how many carbohydrates you can safely have in each meal. This is different for every person. Your dietitian can help you calculate how many carbohydrates you should have at each meal and for snack. Foods that contain carbohydrates include:  Bread, cereal, rice, pasta, and crackers.  Potatoes and corn.  Peas, beans, and lentils.  Milk and yogurt.  Fruit and juice.  Desserts, such as cakes, cookies, ice cream, and candy.  How does alcohol affect me? Alcohol can cause a sudden decrease in blood glucose (hypoglycemia), especially if you use insulin or take certain oral diabetes medicines. Hypoglycemia can be a life-threatening condition. Symptoms of hypoglycemia (sleepiness, dizziness, and confusion) are similar to symptoms of having too much alcohol. If your health care provider says that alcohol is safe for you, follow these guidelines:  Limit alcohol intake to no more than 1 drink per day for nonpregnant women and 2 drinks per day for men. One drink equals 12 oz of beer, 5 oz of wine, or 1 oz of hard liquor.  Do   not drink on an empty stomach.  Keep yourself hydrated with water, diet soda, or unsweetened iced tea.  Keep in mind that regular soda, juice, and other mixers may contain a lot of sugar and must be counted as carbohydrates.  What are tips for following this plan? Reading food labels  Start by checking the serving size on the label. The amount of calories, carbohydrates, fats, and other  nutrients listed on the label are based on one serving of the food. Many foods contain more than one serving per package.  Check the total grams (g) of carbohydrates in one serving. You can calculate the number of servings of carbohydrates in one serving by dividing the total carbohydrates by 15. For example, if a food has 30 g of total carbohydrates, it would be equal to 2 servings of carbohydrates.  Check the number of grams (g) of saturated and trans fats in one serving. Choose foods that have low or no amount of these fats.  Check the number of milligrams (mg) of sodium in one serving. Most people should limit total sodium intake to less than 2,300 mg per day.  Always check the nutrition information of foods labeled as "low-fat" or "nonfat". These foods may be higher in added sugar or refined carbohydrates and should be avoided.  Talk to your dietitian to identify your daily goals for nutrients listed on the label. Shopping  Avoid buying canned, premade, or processed foods. These foods tend to be high in fat, sodium, and added sugar.  Shop around the outside edge of the grocery store. This includes fresh fruits and vegetables, bulk grains, fresh meats, and fresh dairy. Cooking  Use low-heat cooking methods, such as baking, instead of high-heat cooking methods like deep frying.  Cook using healthy oils, such as olive, canola, or sunflower oil.  Avoid cooking with butter, cream, or high-fat meats. Meal planning  Eat meals and snacks regularly, preferably at the same times every day. Avoid going long periods of time without eating.  Eat foods high in fiber, such as fresh fruits, vegetables, beans, and whole grains. Talk to your dietitian about how many servings of carbohydrates you can eat at each meal.  Eat 4-6 ounces of lean protein each day, such as lean meat, chicken, fish, eggs, or tofu. 1 ounce is equal to 1 ounce of meat, chicken, or fish, 1 egg, or 1/4 cup of tofu.  Eat some  foods each day that contain healthy fats, such as avocado, nuts, seeds, and fish. Lifestyle   Check your blood glucose regularly.  Exercise at least 30 minutes 5 or more days each week, or as told by your health care provider.  Take medicines as told by your health care provider.  Do not use any products that contain nicotine or tobacco, such as cigarettes and e-cigarettes. If you need help quitting, ask your health care provider.  Work with a counselor or diabetes educator to identify strategies to manage stress and any emotional and social challenges. What are some questions to ask my health care provider?  Do I need to meet with a diabetes educator?  Do I need to meet with a dietitian?  What number can I call if I have questions?  When are the best times to check my blood glucose? Where to find more information:  American Diabetes Association: diabetes.org/food-and-fitness/food  Academy of Nutrition and Dietetics: www.eatright.org/resources/health/diseases-and-conditions/diabetes  National Institute of Diabetes and Digestive and Kidney Diseases (NIH): www.niddk.nih.gov/health-information/diabetes/overview/diet-eating-physical-activity Summary  A healthy meal plan will   help you control your blood glucose and maintain a healthy lifestyle.  Working with a diet and nutrition specialist (dietitian) can help you make a meal plan that is best for you.  Keep in mind that carbohydrates and alcohol have immediate effects on your blood glucose levels. It is important to count carbohydrates and to use alcohol carefully. This information is not intended to replace advice given to you by your health care provider. Make sure you discuss any questions you have with your health care provider. Document Released: 06/06/2005 Document Revised: 10/14/2016 Document Reviewed: 10/14/2016 Elsevier Interactive Patient Education  2018 Elsevier Inc.  

## 2018-06-15 ENCOUNTER — Encounter (HOSPITAL_COMMUNITY): Payer: Medicare Other

## 2018-06-15 ENCOUNTER — Ambulatory Visit (HOSPITAL_COMMUNITY): Payer: Medicare Other

## 2018-06-16 ENCOUNTER — Telehealth: Payer: Self-pay | Admitting: Urgent Care

## 2018-06-16 NOTE — Telephone Encounter (Signed)
Copied from [place] [id number]. 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 Pt is requesting a call back.   [phone]

## 2018-06-17 ENCOUNTER — Encounter (HOSPITAL_COMMUNITY): Payer: Medicare Other

## 2018-06-17 ENCOUNTER — Ambulatory Visit (HOSPITAL_COMMUNITY): Payer: Medicare Other

## 2018-06-17 NOTE — Progress Notes (Signed)
Discharge Progress Report  Patient Details  Name: Gregory Mathews. MRN: [id number] Date of Birth: January 05, 1946 Referring Provider:     CARDIAC REHAB PHASE II ORIENTATION from 03/17/2018 in [place]  Referring Provider  Glenetta Hew MD       Number of Visits: 13  Reason for Discharge:  Early Exit:  Personal  Smoking History:  Social History   Tobacco Use  Smoking Status Former Smoker  . Packs/day: 1.00  . Years: 15.00  . Pack years: 15.00  . Last attempt to quit: 07/09/1982  . Years since quitting: 35.9  Smokeless Tobacco Never Used    Diagnosis:  Status post coronary artery stent placement  ADL UCSD:   Initial Exercise Prescription: Initial Exercise Prescription - 03/17/18 1500      Date of Initial Exercise RX and Referring Provider   Date  03/17/18    Referring Provider  Glenetta Hew MD      Treadmill   MPH  3    Grade  0    Minutes  10    METs  3.3      Bike   Level  1.2    Minutes  10    METs  3.4      NuStep   Level  4    SPM  80    Minutes  10    METs  3      Prescription Details   Frequency (times per week)  3    Duration  Progress to 30 minutes of continuous aerobic without signs/symptoms of physical distress      Intensity   THRR 40-80% of Max Heartrate  60-120    Ratings of Perceived Exertion  11-15    Perceived Dyspnea  0-4      Progression   Progression  Continue to progress workloads to maintain intensity without signs/symptoms of physical distress.      Resistance Training   Training Prescription  Yes    Weight  4lbs    Reps  10-15       Discharge Exercise Prescription (Final Exercise Prescription Changes): Exercise Prescription Changes - 05/20/18 0950      Response to Exercise   Blood Pressure (Admit)  120/62    Blood Pressure (Exercise)  136/58    Blood Pressure (Exit)  124/70    Heart Rate (Admit)  76 bpm    Heart Rate (Exercise)  111 bpm    Heart Rate (Exit)  86 bpm    Rating  of Perceived Exertion (Exercise)  12    Duration  Progress to 30 minutes of  aerobic without signs/symptoms of physical distress    Intensity  THRR unchanged      Progression   Progression  Continue to progress workloads to maintain intensity without signs/symptoms of physical distress.    Average METs  4.1      Resistance Training   Training Prescription  No   Relaxation day, no weights.     Interval Training   Interval Training  No      Treadmill   MPH  3.5    Grade  3    Minutes  10    METs  5.13      Bike   Level  1.4    Minutes  10    METs  3.68      NuStep   Level  4    SPM  90    Minutes  10  METs  3.4      Home Exercise Plan   Plans to continue exercise at  Home (comment)    Frequency  Add 4 additional days to program exercise sessions.    Initial Home Exercises Provided  04/13/18       Functional Capacity: 6 Minute Walk    Row Name 03/17/18 1459 03/17/18 1556       6 Minute Walk   Phase  Initial  -    Distance  1919 feet  -    Walk Time  6 minutes  -    # of Rest Breaks  0  -    MPH  3.6  -    METS  3.7  -    RPE  10  -    Symptoms  No  -    Resting HR  65 bpm  -    Resting BP  120/70  -    Resting Oxygen Saturation   99 %  -    Exercise Oxygen Saturation  during 6 min walk  98 %  -    Max Ex. HR  91 bpm  -    Max Ex. BP  150/70  -    2 Minute Post BP  -  130/70       Psychological, QOL, Others - Outcomes: PHQ 2/9: Depression screen [facility] 2/9 06/12/2018 05/20/2018 04/20/2018 04/01/2018 03/11/2018  Decreased Interest 0 0 0 0 0  Down, Depressed, Hopeless 0 0 0 0 0  PHQ - 2 Score 0 0 0 0 0    Quality of Life: Quality of Life - 03/17/18 1556      Quality of Life   Select  Quality of Life       Personal Goals: Goals established at orientation with interventions provided to work toward goal. Personal Goals and Risk Factors at Admission - 03/17/18 1509      Core Components/Risk Factors/Patient Goals on Admission    Weight Management   Yes;Obesity;Weight Maintenance;Weight Loss    Intervention  Weight Management: Develop a combined nutrition and exercise program designed to reach desired caloric intake, while maintaining appropriate intake of nutrient and fiber, sodium and fats, and appropriate energy expenditure required for the weight goal.;Weight Management: Provide education and appropriate resources to help participant work on and attain dietary goals.;Weight Management/Obesity: Establish reasonable short term and long term weight goals.;Obesity: Provide education and appropriate resources to help participant work on and attain dietary goals.    Admit Weight  215 lb 13.3 oz (97.9 kg)    Goal Weight: Short Term  210 lb (95.3 kg)    Goal Weight: Long Term  200 lb (90.7 kg)    Expected Outcomes  Short Term: Continue to assess and modify interventions until short term weight is achieved;Long Term: Adherence to nutrition and physical activity/exercise program aimed toward attainment of established weight goal;Weight Maintenance: Understanding of the daily nutrition guidelines, which includes 25-35% calories from fat, 7% or less cal from saturated fats, less than 286m cholesterol, less than 1.5gm of sodium, & 5 or more servings of fruits and vegetables daily;Weight Loss: Understanding of general recommendations for a balanced deficit meal plan, which promotes 1-2 lb weight loss per week and includes a negative energy balance of [phone] kcal/d;Understanding recommendations for meals to include 15-35% energy as protein, 25-35% energy from fat, 35-60% energy from carbohydrates, less than 2017mof dietary cholesterol, 20-35 gm of total fiber daily;Understanding of distribution of calorie intake throughout the day  with the consumption of 4-5 meals/snacks    Lipids  Yes    Intervention  Provide education and support for participant on nutrition & aerobic/resistive exercise along with prescribed medications to achieve LDL <27m, HDL >470m     Expected Outcomes  Short Term: Participant states understanding of desired cholesterol values and is compliant with medications prescribed. Participant is following exercise prescription and nutrition guidelines.;Long Term: Cholesterol controlled with medications as prescribed, with individualized exercise RX and with personalized nutrition plan. Value goals: LDL < 7069mHDL > 40 mg.        Personal Goals Discharge: Goals and Risk Factor Review    Row Name 04/28/18 1415 05/20/18 1351           Core Components/Risk Factors/Patient Goals Review   Personal Goals Review  Weight Management/Obesity;Lipids  Weight Management/Obesity;Lipids      Review  Rayford's vital signs have been stable at phase 2 cardiac rehab. Charlene has maintained his current weight.  Keithan's vital signs have been stable at phase 2 cardiac rehab.  He has completed the CR Program with 13 sessions.       Expected Outcomes  Demmus will continue to participate in phase 2 cardiac rehab, take his medications as presribed and follow lifestyle modificaitons  Rex will continue to participate in exercise, take his medications as presribed and follow lifestyle modificaitons.  [facility]          Exercise Goals and Review: Exercise Goals    Row Name 03/17/18 1507             Exercise Goals   Increase Physical Activity  Yes       Intervention  Provide advice, education, support and counseling about physical activity/exercise needs.;Develop an individualized exercise prescription for aerobic and resistive training based on initial evaluation findings, risk stratification, comorbidities and participant's personal goals.       Expected Outcomes  Short Term: Attend rehab on a regular basis to increase amount of physical activity.;Long Term: Add in home exercise to make exercise part of routine and to increase amount of physical activity.;Long Term: Exercising regularly at least 3-5 days a week.        Increase Strength and Stamina  Yes       Intervention  Provide advice, education, support and counseling about physical activity/exercise needs.;Develop an individualized exercise prescription for aerobic and resistive training based on initial evaluation findings, risk stratification, comorbidities and participant's personal goals.       Expected Outcomes  Short Term: Increase workloads from initial exercise prescription for resistance, speed, and METs.;Short Term: Perform resistance training exercises routinely during rehab and add in resistance training at home;Long Term: Improve cardiorespiratory fitness, muscular endurance and strength as measured by increased METs and functional capacity (6MWT)       Able to understand and use rate of perceived exertion (RPE) scale  Yes       Intervention  Provide education and explanation on how to use RPE scale       Expected Outcomes  Short Term: Able to use RPE daily in rehab to express subjective intensity level;Long Term:  Able to use RPE to guide intensity level when exercising independently       Knowledge and understanding of Target Heart Rate Range (THRR)  Yes       Intervention  Provide education and explanation of THRR including how the numbers were predicted and where they are located for  reference       Expected Outcomes  Short Term: Able to state/look up THRR;Long Term: Able to use THRR to govern intensity when exercising independently;Short Term: Able to use daily as guideline for intensity in rehab       Able to check pulse independently  Yes       Intervention  Provide education and demonstration on how to check pulse in carotid and radial arteries.;Review the importance of being able to check your own pulse for safety during independent exercise       Expected Outcomes  Short Term: Able to explain why pulse checking is important during independent exercise;Long Term: Able to check pulse independently and accurately       Understanding of Exercise  Prescription  Yes       Intervention  Provide education, explanation, and written materials on patient's individual exercise prescription       Expected Outcomes  Short Term: Able to explain program exercise prescription;Long Term: Able to explain home exercise prescription to exercise independently          Nutrition & Weight - Outcomes: Pre Biometrics - 03/17/18 1508      Pre Biometrics   Height  '5\' 8"'  (1.727 m)    Weight  97.9 kg    Waist Circumference  43.5 inches    Hip Circumference  41 inches    Waist to Hip Ratio  1.06 %    BMI (Calculated)  32.82    Triceps Skinfold  20 mm    % Body Fat  32.4 %    Grip Strength  45 kg    Flexibility  9 in    Single Leg Stand  8.03 seconds      Post Biometrics - 05/20/18 1051       Post  Biometrics   Weight  98.5 kg    BMI (Calculated)  32.05       Nutrition: Nutrition Therapy & Goals - 04/03/18 1059      Nutrition Therapy   Diet  heart healthy      Personal Nutrition Goals   Nutrition Goal  Pt to identify food quantities necessary to achieve weight loss of 6-15 lbs. at graduation from cardiac rehab.    Personal Goal #2  Pt to identify and limit food sources of saturated fat, trans fat, and sodium      Intervention Plan   Intervention  Prescribe, educate and counsel regarding individualized specific dietary modifications aiming towards targeted core components such as weight, hypertension, lipid management, diabetes, heart failure and other comorbidities.    Expected Outcomes  Short Term Goal: Understand basic principles of dietary content, such as calories, fat, sodium, cholesterol and nutrients.       Nutrition Discharge: Nutrition Assessments - 06/11/18 1022      MEDFICTS Scores   Pre Score  18    Post Score  --   pt did not return      Education Questionnaire Score:   Goals reviewed with patient; copy given to patient. Pt graduated from cardiac rehab program on 05/20/18 with completion of 13 exercise sessions in  Phase II. Pt maintained good attendance and progressed nicely during his participation in rehab as evidenced by increased MET level.   Medication list reconciled. Repeat  PHQ score- 0 .  Pt has made significant lifestyle changes and should be commended for his success. Pt feels he has achieved his goals during cardiac rehab.   [facility] [employer] [facility].Barnet Pall, RN,BSN 06/17/2018 5:04 PM

## 2018-06-17 NOTE — Addendum Note (Signed)
Encounter addended by: Magda Kiel, RN on: 06/17/2018 5:08 PM  Actions taken: Sign clinical note

## 2018-06-18 NOTE — Telephone Encounter (Signed)
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 Please advise. Would like a call once this is complete.

## 2018-06-19 ENCOUNTER — Ambulatory Visit (HOSPITAL_COMMUNITY): Payer: Medicare Other

## 2018-06-19 ENCOUNTER — Encounter (HOSPITAL_COMMUNITY): Payer: Medicare Other

## 2018-06-22 ENCOUNTER — Encounter (HOSPITAL_COMMUNITY): Payer: Medicare Other

## 2018-06-22 ENCOUNTER — Ambulatory Visit (HOSPITAL_COMMUNITY): Payer: Medicare Other

## 2018-06-23 ENCOUNTER — Telehealth: Payer: Self-pay | Admitting: *Deleted

## 2018-06-23 NOTE — Telephone Encounter (Signed)
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

## 2018-06-24 ENCOUNTER — Ambulatory Visit (HOSPITAL_COMMUNITY): Payer: Medicare Other

## 2018-06-24 ENCOUNTER — Encounter (HOSPITAL_COMMUNITY): Payer: Medicare Other

## 2018-06-26 ENCOUNTER — Encounter (HOSPITAL_COMMUNITY): Payer: Medicare Other

## 2018-06-26 ENCOUNTER — Ambulatory Visit (HOSPITAL_COMMUNITY): Payer: Medicare Other

## 2018-06-29 ENCOUNTER — Ambulatory Visit (INDEPENDENT_AMBULATORY_CARE_PROVIDER_SITE_OTHER): Payer: Medicare Other | Admitting: Family Medicine

## 2018-06-29 ENCOUNTER — Encounter (HOSPITAL_COMMUNITY): Payer: Medicare Other

## 2018-06-29 ENCOUNTER — Other Ambulatory Visit: Payer: Self-pay

## 2018-06-29 ENCOUNTER — Encounter: Payer: Self-pay | Admitting: Family Medicine

## 2018-06-29 VITALS — BP 136/82 | HR 72 | Temp 98.0°F | Resp 16 | Ht 68.7 in | Wt 218.0 lb

## 2018-06-29 DIAGNOSIS — Z024 Encounter for examination for driving license: Secondary | ICD-10-CM

## 2018-06-29 NOTE — Patient Instructions (Addendum)
  1 year DOT card expires 04/21/19.    If you have lab work done today you will be contacted with your lab results within the next 2 weeks.  If you have not heard from [place] then please contact us. The fastest way to get your results is to register for My Chart.   IF you received an x-ray today, you will receive an invoice from [facility]. Please contact [facility] at [phone] with questions or concerns regarding your invoice.   IF you received labwork today, you will receive an invoice from [facility]. Please contact [facility] at [phone] with questions or concerns regarding your invoice.   Our billing staff will not be able to assist you with questions regarding bills from these companies.  You will be contacted with the lab results as soon as they are available. The fastest way to get your results is to activate your My Chart account. Instructions are located on the last page of this paperwork. If you have not heard from [place] regarding the results in 2 weeks, please contact this office.

## 2018-06-29 NOTE — Progress Notes (Signed)
Subjective:  By signing my name below, I, Gregory Mathews, attest that this documentation has been prepared under the direction and in the presence of Merri Ray, MD. Electronically Signed: [name], [place]. 06/29/2018 , 2:37 PM .  Patient was seen in Room 12 .   Patient ID: Gregory Mathews., male    DOB: 1946/02/24, 72 y.o.   MRN: [id number] Chief Complaint  Patient presents with  . 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 is a 72 y.o. male  Here for follow up of BP; seen for DOT physical on Sept 5th. He had BP of 151/69 at that time. He has a history of CAD with prior stent, DM and HTN. From cardiologist standpoint, patient was cleared with stress test recently when seen by cardiology. His most recent A1C was controlled at 7.1. Regarding his DOT card, his initial card was provided on July 29th, and current card good through 07/21/18.   HTN He's currently taking Losartan 25 mg qd and Toprol XL 25 mg qd. He denies any extra doses or changes in medications. [facility] and [facility] which runs around systolic 694W. He notes having white coat syndrome, where his BP usually runs higher at doctor's office. He denies chest pain, shortness of breath, lightheadedness or dizziness. He denies dizziness, chest pain or shortness of breath with exertion or activity going in and out of the truck. He denies history of sleep apnea or snoring. He denies daytime somnolence.   BP Readings from Last 3 Encounters:  06/29/18 136/82  06/12/18 138/72  05/28/18 (!) 160/62   Diabetes He denies symptomatic hypoglycemia recently. He denies wearing glasses or contacts to drive.   Patient Active Problem List   Diagnosis Date Noted  . Uncircumcised male 03/11/2018  . Coronary artery disease involving native coronary artery of native heart without angina pectoris 11/04/2017  . Abnormal findings on diagnostic  imaging of cardiovascular system 11/04/2017  . Pre-op testing 09/22/2017  . Agatston coronary artery calcium score between 100 and 199 09/22/2017  . Family history of early CAD 09/11/2017  . Mild aortic stenosis by prior echocardiogram 09/11/2017  . Metabolic syndrome 54/62/7035  . 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 BMI 31.0-31.9,adult 03/13/2016  . Atypical chest pain 08/29/2015  . Essential hypertension 08/29/2015  . Diverticulosis of colon without hemorrhage 08/01/2015  . Statin intolerance 08/01/2015  . Basal cell carcinoma of nose 08/23/2014  . Rosacea 08/23/2014  . Seasonal allergies 01/26/2013  . Diabetes mellitus type 2, uncomplicated ([place]) 00/93/8182  . Hyperlipidemia associated with type 2 diabetes mellitus ([place]) 07/09/2012  . Erectile dysfunction 07/09/2012   Past Medical History:  Diagnosis Date  . Allergy   . Basal cell carcinoma 01/2013   Nasal and R forearm.  [facility]  . CAD S/P percutaneous coronary angioplasty    2/19 PCI/DESx1 to mLAD, FFR 0.7 -Sierra DES 2.75 mm x 18 mm  . Cataract   . Diabetes mellitus without complication (HCC)    on PO Meds  . Essential hypertension   . Hyperlipidemia    statin intolerant -- myalgias, fatigue (tried at least 4)  . Rosacea    Past Surgical History:  Procedure Laterality Date  . basal cell removal     nose and wrist   . BELPHAROPTOSIS REPAIR    . CORONARY CT ANGIOGRAM  09/2017   Coronary Calcium  Score: 111. Coronary calcification noted in the LEFT MAIN (LM) and prox LAD.  LM < 50% calcified stenosis.  Mid LAD 50-75% plaque noted CT FFR --> positive at 0.80.  Recommend CARDIAC CATHETERIZATION  . CORONARY STENT INTERVENTION N/A 11/12/2017   Procedure: CORONARY STENT INTERVENTION;  Surgeon: Leonie Man, MD;  Location: [facility];  Service: Cardiovascular:  DES PCI:  STENT SIERRA 2.75 X 18 MM - Post intervention, there is a 0% residual stenosis.  Marland Kitchen ETT/GXT: Graded Exercise Tolerance Test  08/2015     Exercised for 9:01 min --> blunted Mathews pressure response no EKG changes.  No chest pain.  Low Risk  . EYE SURGERY     Cataract surgery  . eyelid surgery    . INTRAVASCULAR PRESSURE WIRE/FFR STUDY N/A 11/12/2017   Procedure: INTRAVASCULAR PRESSURE WIRE/FFR STUDY;  Surgeon: Leonie Man, MD;  Location: [facility];  Service: Cardiovascular;  Laterality: LAD -FFR 0.76  . LEFT HEART CATH AND CORONARY ANGIOGRAPHY N/A 11/12/2017   Procedure: LEFT HEART CATH AND CORONARY ANGIOGRAPHY;  Surgeon: Leonie Man, MD;  Location: [facility]: 70% P-M LAD (FFR 0.76).  40% mid LAD, 50% ostial ramus.  EF 55-60%.  Mildly elevated LVEDP.  Marland Kitchen SHOULDER OPEN ROTATOR CUFF REPAIR  1983  . TRANSTHORACIC ECHOCARDIOGRAM  08/2017   EF 55-60%.  Mild aortic stenosis (mean gradient 11 mmH.)  GR 1 DD.  Mild LA dilation.   Allergies  Allergen Reactions  . Lisinopril Cough  . Statins Other (See Comments)    Severe leg pain & fatigue -- tried at least 4 (even low dose)   Prior to Admission medications   Medication Sig Start Date End Date Taking? Authorizing Provider  aspirin 81 MG tablet Take 81 mg by mouth every other day.    Yes [provider]  Mathews glucose meter kit and supplies Use to test Mathews sugar daily. Dx: E11.9 10/11/15  Yes Tereasa Coop, PA-C  clopidogrel (PLAVIX) 75 MG tablet TAKE 1 TABLET BY MOUTH EVERY DAY 06/05/18  Yes Leonie Man, MD  glipiZIDE (GLUCOTROL) 5 MG tablet Take 1 tablet (5 mg total) by mouth 2 (two) times daily before a meal. 06/12/18 09/10/18 Yes Sagardia, Ines Bloomer, MD  Icosapent Ethyl 1 g CAPS Take 2 capsules (2 g total) by mouth 2 (two) times daily. 05/19/18  Yes Leonie Man, MD  Lancets MISC Use to test Mathews sugar daily. Dx code :E11.9 10/11/15  Yes Tereasa Coop, PA-C  losartan (COZAAR) 25 MG tablet Take 1 tablet (25 mg total) by mouth daily. 05/18/18  Yes Lendon Colonel, NP  metoprolol succinate (TOPROL-XL) 25 MG 24 hr tablet Take 1 tablet  (25 mg total) by mouth daily. 05/18/18  Yes Lendon Colonel, NP  Multiple Vitamin (MULTIVITAMIN) tablet Take 1 tablet by mouth daily.   Yes [provider]  nitroGLYCERIN (NITROSTAT) 0.4 MG SL tablet Place 1 tablet (0.4 mg total) under the tongue every 5 (five) minutes x 3 doses as needed for chest pain. 01/19/18  Yes Leonie Man, MD  ONE TOUCH ULTRA TEST test strip TEST Mathews SUGAR DAILY 02/25/18  Yes Tereasa Coop, PA-C  sitaGLIPtin (JANUVIA) 100 MG tablet Take 1 tablet (100 mg total) by mouth daily. 06/12/18 09/10/18 Yes SagardiaInes Bloomer, MD   Social History   Socioeconomic History  . Marital status: Married    Spouse name: [name]  . Number of children: 4  .  Years of education: 76  . Highest education level: Not on file  Occupational History  . Occupation: bus Engineer, manufacturing systems: [employer]  . Financial resource strain: Not on file  . Food insecurity:    Worry: Not on file    Inability: Not on file  . Transportation needs:    Medical: Not on file    Non-medical: Not on file  Tobacco Use  . Smoking status: Former Smoker    Packs/day: 1.00    Years: 15.00    Pack years: 15.00    Last attempt to quit: 07/09/1982    Years since quitting: 35.9  . Smokeless tobacco: Never Used  Substance and Sexual Activity  . Alcohol use: No  . Drug use: No  . Sexual activity: Yes    Partners: Female  Lifestyle  . Physical activity:    Days per week: Not on file    Minutes per session: Not on file  . Stress: Not on file  Relationships  . Social connections:    Talks on phone: Not on file    Gets together: Not on file    Attends religious service: Not on file    Active member of club or organization: Not on file    Attends meetings of clubs or organizations: Not on file    Relationship status: Not on file  . Intimate partner violence:    Fear of current or ex partner: Not on file    Emotionally abused: Not on file     Physically abused: Not on file    Forced sexual activity: Not on file  Other Topics Concern  . Not on file  Social History Narrative   Marital status: married x 49 years; happily married.       Children:  4 children; 4 grandchildren. Adult children all live locally.      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      Tobacco:  In past; smoked x 15 years; quit 35 years ago.      Alcohol: none      Exercise:sporadic in 2017.      ADLs: independent with all ADLs.  Does not use assistant device with ambulation.      Living Will:  Has living will; desires FULL CODE; no prolonged measures.      Doctoral degree in Biblical Studies.       Review of Systems  Constitutional: Negative for fatigue and unexpected weight change.  Eyes: Negative for visual disturbance.  Respiratory: Negative for cough, chest tightness and shortness of breath.   Cardiovascular: Negative for chest pain, palpitations and leg swelling.  Gastrointestinal: Negative for abdominal pain and Mathews in stool.  Neurological: Negative for dizziness, light-headedness and headaches.       Objective:   Physical Exam  Constitutional: He is oriented to person, place, and time. He appears well-developed and well-nourished.  HENT:  Head: Normocephalic and atraumatic.  Right Ear: External ear normal.  Left Ear: External ear normal.  Mouth/Throat: Oropharynx is clear and moist.  Eyes: Pupils are equal, round, and reactive to light. Conjunctivae and EOM are normal.  Neck: Normal range of motion. Neck supple. No thyromegaly present.  Cardiovascular: Normal rate, regular rhythm, normal heart sounds and intact distal pulses.  Pulmonary/Chest: Effort normal and breath sounds normal. No respiratory distress. He has no wheezes.  Abdominal: Soft.  He exhibits no distension. There is no tenderness. Hernia confirmed negative in the right inguinal area  and confirmed negative in the left inguinal area.  Musculoskeletal: Normal range of motion. He exhibits no edema or tenderness.  Lymphadenopathy:    He has no cervical adenopathy.  Neurological: He is alert and oriented to person, place, and time. He has normal reflexes.  Skin: Skin is warm and dry.  Psychiatric: He has a normal mood and affect. His behavior is normal.  Vitals reviewed.   Vitals:   06/29/18 1406  BP: 136/82  Pulse: 72  Resp: 16  Temp: 98 F (36.7 C)  TempSrc: Oral  SpO2: 96%  Weight: 218 lb (98.9 kg)  Height: 5' 8.7" (1.745 m)       Assessment & Plan:   Usman Mikkel Charrette. is a 72 y.o. male Encounter for commercial driver medical examination (CDME)  -Previous DOT physical reviewed from July 29th with other provider who is no longer practicing at this office.  I independently went through his history, medical problems, and independent physical exam as above.  Agree with previous notations on chart regarding his physical. Denies any new symptoms or new medications.  Most recent A1c was controlled.  Asymptomatic with regards to his cardiac disease, and recent stress testing without ischemia.  Repeat Mathews pressure improved and in passing range.   -1 year card provided from initial date of exam of April 20, 2018, expiration date is April 21, 2019.   - see paperwork.   No orders of the defined types were placed in this encounter.  Patient Instructions    1 year DOT card expires 04/21/19.    If you have lab work done today you will be contacted with your lab results within the next 2 weeks.  If you have not heard from [place] then please contact us. The fastest way to get your results is to register for My Chart.   IF you received an x-ray today, you will receive an invoice from [facility]. Please contact [facility] at [phone] with questions or concerns regarding your invoice.   IF you received labwork today, you will receive an invoice from  [facility]. Please contact [facility] at [phone] with questions or concerns regarding your invoice.   Our billing staff will not be able to assist you with questions regarding bills from these companies.  You will be contacted with the lab results as soon as they are available. The fastest way to get your results is to activate your My Chart account. Instructions are located on the last page of this paperwork. If you have not heard from [place] regarding the results in 2 weeks, please contact this office.       I personally performed the services described in this documentation, which was scribed in my presence. The recorded information has been reviewed and considered for accuracy and completeness, addended by me as needed, and agree with information above.  Signed,   Merri Ray, MD [facility].  06/29/18 3:24 PM

## 2018-07-01 ENCOUNTER — Encounter (HOSPITAL_COMMUNITY): Payer: Medicare Other

## 2018-07-03 ENCOUNTER — Encounter (HOSPITAL_COMMUNITY): Payer: Medicare Other

## 2018-08-22 ENCOUNTER — Other Ambulatory Visit: Payer: Self-pay

## 2018-08-22 ENCOUNTER — Encounter: Payer: Self-pay | Admitting: Physician Assistant

## 2018-08-22 ENCOUNTER — Ambulatory Visit (INDEPENDENT_AMBULATORY_CARE_PROVIDER_SITE_OTHER): Payer: Medicare Other | Admitting: Physician Assistant

## 2018-08-22 VITALS — BP 149/76 | HR 60 | Temp 97.5°F | Resp 16 | Ht 68.0 in | Wt 217.2 lb

## 2018-08-22 DIAGNOSIS — R05 Cough: Secondary | ICD-10-CM | POA: Diagnosis not present

## 2018-08-22 DIAGNOSIS — J22 Unspecified acute lower respiratory infection: Secondary | ICD-10-CM | POA: Diagnosis not present

## 2018-08-22 DIAGNOSIS — R059 Cough, unspecified: Secondary | ICD-10-CM

## 2018-08-22 MED ORDER — HYDROCOD POLST-CPM POLST ER 10-8 MG/5ML PO SUER: 5.0000 mL | Freq: Two times a day (BID) | ORAL | 0 refills | Status: DC | PRN

## 2018-08-22 MED ORDER — BENZONATATE 100 MG PO CAPS: 100.0000 mg | ORAL_CAPSULE | Freq: Three times a day (TID) | ORAL | 0 refills | Status: DC | PRN

## 2018-08-22 MED ORDER — AZITHROMYCIN 250 MG PO TABS: ORAL_TABLET | ORAL | 0 refills | Status: DC

## 2018-08-22 NOTE — Progress Notes (Signed)
Ozzie Dimas [place].  MRN: [id number] DOB: 1946-03-27  PCP: Patient, No Pcp Per  Subjective:  Pt is a 72 year old male who presents to clinic for cough x 1 week. Cough is productive.   Chills a few days ago.  Overall he is feeling worse.   Pt  has a past medical history of Allergy, Basal cell carcinoma (01/2013), CAD S/P percutaneous coronary angioplasty, Cataract, Diabetes mellitus without complication ([place]), Essential hypertension, Hyperlipidemia, and Rosacea.  Review of Systems  Constitutional: Positive for chills and fatigue. Negative for diaphoresis and fever.  HENT: Negative for congestion, postnasal drip, rhinorrhea, sinus pressure, sinus pain, sneezing and sore throat.   Respiratory: Positive for cough. Negative for shortness of breath and wheezing.     Patient Active Problem List   Diagnosis Date Noted  . Uncircumcised male 03/11/2018  . Coronary artery disease involving native coronary artery of native heart without angina pectoris 11/04/2017  . Abnormal findings on diagnostic imaging of cardiovascular system 11/04/2017  . Pre-op testing 09/22/2017  . Agatston coronary artery calcium score between 100 and 199 09/22/2017  . Family history of early CAD 09/11/2017  . Mild aortic stenosis by prior echocardiogram 09/11/2017  . Metabolic syndrome 00/93/8182  . 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 BMI 31.0-31.9,adult 03/13/2016  . Atypical chest pain 08/29/2015  . Essential hypertension 08/29/2015  . Diverticulosis of colon without hemorrhage 08/01/2015  . Statin intolerance 08/01/2015  . Basal cell carcinoma of nose 08/23/2014  . Rosacea 08/23/2014  . Seasonal allergies 01/26/2013  . Diabetes mellitus type 2, uncomplicated ([place]) 99/37/1696  . Hyperlipidemia associated with type 2 diabetes mellitus ([place]) 07/09/2012  . Erectile dysfunction 07/09/2012    Current Outpatient Medications on File Prior to Visit  Medication Sig Dispense Refill  . aspirin 81 MG tablet Take 81 mg  by mouth every other day.     . blood glucose meter kit and supplies Use to test blood sugar daily. Dx: E11.9 1 each 0  . clopidogrel (PLAVIX) 75 MG tablet TAKE 1 TABLET BY MOUTH EVERY DAY 90 tablet 3  . glipiZIDE (GLUCOTROL) 5 MG tablet Take 1 tablet (5 mg total) by mouth 2 (two) times daily before a meal. 180 tablet 3  . Lancets MISC Use to test blood sugar daily. Dx code :E11.9 100 each 3  . losartan (COZAAR) 25 MG tablet Take 1 tablet (25 mg total) by mouth daily. 90 tablet 1  . metoprolol succinate (TOPROL-XL) 25 MG 24 hr tablet Take 1 tablet (25 mg total) by mouth daily. 90 tablet 1  . Multiple Vitamin (MULTIVITAMIN) tablet Take 1 tablet by mouth daily.    . nitroGLYCERIN (NITROSTAT) 0.4 MG SL tablet Place 1 tablet (0.4 mg total) under the tongue every 5 (five) minutes x 3 doses as needed for chest pain. 25 tablet 3  . ONE TOUCH ULTRA TEST test strip TEST BLOOD SUGAR DAILY 100 each 2  . sitaGLIPtin (JANUVIA) 100 MG tablet Take 1 tablet (100 mg total) by mouth daily. 90 tablet 3  . Icosapent Ethyl 1 g CAPS Take 2 capsules (2 g total) by mouth 2 (two) times daily. (Patient not taking: Reported on 08/22/2018) 120 capsule 1   No current facility-administered medications on file prior to visit.     Allergies  Allergen Reactions  . Lisinopril Cough  . Statins Other (See Comments)    Severe leg pain & fatigue -- tried at least 4 (even low dose)     Objective:  Blood pressure Marland Kitchen)  149/76, pulse 60, temperature (!) 97.5 F (36.4 C), temperature source Oral, resp. rate 16, height _0  (1.727 m), weight 217 lb 3.2 oz (98.5 kg), SpO2 98 %.  Physical Exam  Constitutional: He is oriented to person, place, and time. No distress.  HENT:  Right Ear: Tympanic membrane normal.  Left Ear: Tympanic membrane normal.  Nose: Mucosal edema present. No rhinorrhea. Right sinus exhibits no maxillary sinus tenderness and no frontal sinus tenderness. Left sinus exhibits no maxillary sinus tenderness and no  frontal sinus tenderness.  Mouth/Throat: Oropharynx is clear and moist and mucous membranes are normal.  Cardiovascular: Normal rate, regular rhythm and normal heart sounds.  Pulmonary/Chest: Effort normal. No respiratory distress. He has no wheezes. He has rhonchi in the left lower field. He has rales in the left lower field.  Neurological: He is alert and oriented to person, place, and time.  Skin: Skin is warm and dry.  Psychiatric: Judgment normal.  Vitals reviewed.   Assessment and Plan :  1. Lower respiratory infection - azithromycin (ZITHROMAX) 250 MG tablet; Take 2 tabs PO x 1 dose, then 1 tab PO QD x 4 days  Dispense: 6 tablet; Refill: 0  2. Cough - chlorpheniramine-HYDROcodone (TUSSIONEX PENNKINETIC ER) 10-8 MG/5ML SUER; Take 5 mLs by mouth every 12 (twelve) hours as needed for cough.  Dispense: 100 mL; Refill: 0 - benzonatate (TESSALON) 100 MG capsule; Take 1-2 capsules (100-200 mg total) by mouth 3 (three) times daily as needed for cough.  Dispense: 40 capsule; Refill: 0   Whitney Chirsty Armistead, PA-C  [facility] 08/22/2018 1:08 PM  Please note: Portions of this report may have been transcribed using dragon voice recognition software. Every effort was made to ensure accuracy; however, inadvertent computerized transcription errors may be present.

## 2018-08-22 NOTE — Patient Instructions (Addendum)
   Azithromycin is an antibiotic. Take the entire course of this medication, even if you start to feel better sooner.  Tessalon is for cough during the day. This should not make you drowsy.  Tussionex is to help your cough at night. This will make you drowsy. Do not take this with any other medications that make your drowsy.  Mucinex will help thin out your mucus to help you clear it out. Drink plenty of water while taking this medication.   Stay well hydrated. Get lost of rest. Wash your hands often.   -Foods that can help speed recovery: honey, garlic, chicken soup, elderberries, green tea.  -Supplements that can help speed recovery: vitamin C, zinc, elderberry extract, quercetin, ginseng, selenium -Supplement with prebiotics and probiotics:   Advil or ibuprofen for pain. Do not take Aspirin.  Drink enough water and fluids to keep your urine clear or pale yellow.  For sore throat try using a honey-based tea. Use 3 teaspoons of honey with juice squeezed from half lemon. Place shaved pieces of ginger into 1/2-1 cup of water and warm over stove top. Then mix the ingredients and repeat every 4 hours as needed.  Cough Syrup Recipe: Sweet Lemon & Honey Thyme  Ingredients . a handful of fresh thyme sprigs   . 1 pint of water (2 cups)  . 1/2 cup honey (raw is best, but regular will do)  . 1/2 lemon chopped Instructions 1. Place the lemon in the pint jar and cover with the honey. The honey will macerate the lemons and draw out liquids which taste so delicious! 2. Meanwhile, toss the thyme leaves into a saucepan and cover them with the water. 3. Bring the water to a gentle simmer and reduce it to half, about a cup of tea. 4. When the tea is reduced and cooled a bit, strain the sprigs & leaves, add it into the pint jar and stir it well. 5. Give it a shake and use a spoonful as needed. 6. Store your homemade cough syrup in the refrigerator for about a month.  What causes a cough?  In adults,  common causes of a cough include: ?An infection of the airways or lungs (such as the common cold) ?Postnasal drip - Postnasal drip is when mucus from the nose drips down or flows along the back of the throat. Postnasal drip can happen when people have: .A cold .Allergies .A sinus infection - The sinuses are hollow areas in the bones of the face that open into the nose. ?Lung conditions, like asthma and chronic obstructive pulmonary disease (COPD) - Both of these conditions can make it hard to breathe. COPD is usually caused by smoking. ?Acid reflux - Acid reflux is when the acid that is normally in your stomach backs up into your esophagus (the tube that carries food from your mouth to your stomach). ?A side effect from blood pressure medicines called "ACE inhibitors" ?Smoking cigarettes  Is there anything I can do on my own to get rid of my cough?  Yes. To help get rid of your cough, you can: ?Use a humidifier in your bedroom ?Use an over-the-counter cough medicine, or suck on cough drops or hard candy ?Stop smoking, if you smoke ?If you have allergies, avoid the things you are allergic to (like pollen, dust, animals, or mold) If you have acid reflux, your doctor or nurse will tell you which lifestyle changes can help reduce symptoms.

## 2018-08-24 ENCOUNTER — Encounter: Payer: Medicare Other | Admitting: Emergency Medicine

## 2018-08-27 ENCOUNTER — Telehealth: Payer: Self-pay | Admitting: Pharmacist

## 2018-08-27 NOTE — Telephone Encounter (Signed)
Patient will have physical next week and plans to repeat Lipid panel.    Will call back to give [place] the okay to start PA for Repatha.

## 2018-09-02 LAB — HM DIABETES EYE EXAM

## 2018-09-08 ENCOUNTER — Encounter: Payer: Self-pay | Admitting: *Deleted

## 2018-10-01 ENCOUNTER — Other Ambulatory Visit: Payer: Self-pay

## 2018-10-01 ENCOUNTER — Encounter: Payer: Self-pay | Admitting: Emergency Medicine

## 2018-10-01 ENCOUNTER — Ambulatory Visit (INDEPENDENT_AMBULATORY_CARE_PROVIDER_SITE_OTHER): Payer: Medicare Other | Admitting: Emergency Medicine

## 2018-10-01 VITALS — BP 125/72 | HR 65 | Temp 98.3°F | Resp 16 | Ht 68.0 in | Wt 217.2 lb

## 2018-10-01 DIAGNOSIS — Z130 Encounter for screening for diseases of the blood and blood-forming organs and certain disorders involving the immune mechanism: Secondary | ICD-10-CM

## 2018-10-01 DIAGNOSIS — Z1329 Encounter for screening for other suspected endocrine disorder: Secondary | ICD-10-CM

## 2018-10-01 DIAGNOSIS — E785 Hyperlipidemia, unspecified: Secondary | ICD-10-CM | POA: Diagnosis not present

## 2018-10-01 DIAGNOSIS — I10 Essential (primary) hypertension: Secondary | ICD-10-CM

## 2018-10-01 DIAGNOSIS — Z0001 Encounter for general adult medical examination with abnormal findings: Secondary | ICD-10-CM

## 2018-10-01 DIAGNOSIS — Z13228 Encounter for screening for other metabolic disorders: Secondary | ICD-10-CM

## 2018-10-01 DIAGNOSIS — E1165 Type 2 diabetes mellitus with hyperglycemia: Secondary | ICD-10-CM

## 2018-10-01 DIAGNOSIS — Z125 Encounter for screening for malignant neoplasm of prostate: Secondary | ICD-10-CM

## 2018-10-01 LAB — POCT URINALYSIS DIP (MANUAL ENTRY)
Bilirubin, UA: NEGATIVE
Blood, UA: NEGATIVE
Glucose, UA: NEGATIVE mg/dL
Ketones, POC UA: NEGATIVE mg/dL
Leukocytes, UA: NEGATIVE
Nitrite, UA: NEGATIVE
Protein Ur, POC: NEGATIVE mg/dL
SPEC GRAV UA: 1.01 (ref 1.010–1.025)
Urobilinogen, UA: 0.2 E.U./dL
pH, UA: 5.5 (ref 5.0–8.0)

## 2018-10-01 LAB — POCT GLYCOSYLATED HEMOGLOBIN (HGB A1C): Hemoglobin A1C: 6.9 % — AB (ref 4.0–5.6)

## 2018-10-01 LAB — GLUCOSE, POCT (MANUAL RESULT ENTRY): POC Glucose: 106 mg/dl — AB (ref 70–99)

## 2018-10-01 MED ORDER — GLIPIZIDE 5 MG PO TABS: 5.0000 mg | ORAL_TABLET | Freq: Two times a day (BID) | ORAL | 3 refills | Status: DC

## 2018-10-01 MED ORDER — LOSARTAN POTASSIUM 25 MG PO TABS: 25.0000 mg | ORAL_TABLET | Freq: Every day | ORAL | 3 refills | Status: DC

## 2018-10-01 NOTE — Assessment & Plan Note (Signed)
Hemoglobin A1c at 6.9 today.  Better than last time.  Continue present medications and follow-up in 6 months.

## 2018-10-01 NOTE — Progress Notes (Signed)
BP Readings from Last 3 Encounters:  10/01/18 125/72  08/22/18 (!) 149/76  06/29/18 136/82   Wt Readings from Last 3 Encounters:  10/01/18 217 lb 3.2 oz (98.5 kg)  08/22/18 217 lb 3.2 oz (98.5 kg)  06/29/18 218 lb (98.9 kg)   Lab Results  Component Value Date   HGBA1C 7.1 (A) 06/12/2018   Gregory Goecke Jr. 73 y.o.   Chief Complaint  Patient presents with  . Annual Exam  . Diabetes    HISTORY OF PRESENT ILLNESS: This is a 73 y.o. male here for his annual exam and diabetes follow-up.  Also has a history of hypertension and dyslipidemia and coronary artery disease.  Developed side effects to Januvia so he is no longer taking that.  Has no complaints or medical concerns today.  Patient cannot take statins so he is taking fish oil medication.  Was seen at the lipid clinic on 05/19/2018 and started on Vascepa 2 g twice daily.  HPI   Prior to Admission medications   Medication Sig Start Date End Date Taking? Authorizing Provider  aspirin 81 MG tablet Take 81 mg by mouth every other day.    Yes [provider]  clopidogrel (PLAVIX) 75 MG tablet TAKE 1 TABLET BY MOUTH EVERY DAY 06/05/18  Yes Leonie Man, MD  losartan (COZAAR) 25 MG tablet Take 1 tablet (25 mg total) by mouth daily. 05/18/18  Yes Lendon Colonel, NP  metoprolol succinate (TOPROL-XL) 25 MG 24 hr tablet Take 1 tablet (25 mg total) by mouth daily. 05/18/18  Yes Lendon Colonel, NP  Multiple Vitamin (MULTIVITAMIN) tablet Take 1 tablet by mouth daily.   Yes [provider]  nitroGLYCERIN (NITROSTAT) 0.4 MG SL tablet Place 1 tablet (0.4 mg total) under the tongue every 5 (five) minutes x 3 doses as needed for chest pain. 01/19/18  Yes Leonie Man, MD  blood glucose meter kit and supplies Use to test blood sugar daily. Dx: E11.9 10/11/15   Tereasa Coop, PA-C  glipiZIDE (GLUCOTROL) 5 MG tablet Take 1 tablet (5 mg total) by mouth 2 (two) times daily before a meal. 06/12/18 09/10/18  Natashia Roseman,  Ines Bloomer, MD  Icosapent Ethyl 1 g CAPS Take 2 capsules (2 g total) by mouth 2 (two) times daily. Patient not taking: Reported on 08/22/2018 05/19/18   Leonie Man, MD  Lancets MISC Use to test blood sugar daily. Dx code :E11.9 10/11/15   Tereasa Coop, PA-C  ONE TOUCH ULTRA TEST test strip TEST BLOOD SUGAR DAILY 02/25/18   Tereasa Coop, PA-C  sitaGLIPtin (JANUVIA) 100 MG tablet Take 1 tablet (100 mg total) by mouth daily. 06/12/18 09/10/18  Horald Pollen, MD    Allergies  Allergen Reactions  . Januvia [Sitagliptin] Other (See Comments)    Gi upset and kidney problems  . Lisinopril Cough  . Statins Other (See Comments)    Severe leg pain & fatigue -- tried at least 4 (even low dose)    Patient Active Problem List   Diagnosis Date Noted  . Uncircumcised male 03/11/2018  . Coronary artery disease involving native coronary artery of native heart without angina pectoris 11/04/2017  . Abnormal findings on diagnostic imaging of cardiovascular system 11/04/2017  . Pre-op testing 09/22/2017  . Agatston coronary artery calcium score between 100 and 199 09/22/2017  . Family history of early CAD 09/11/2017  . Mild aortic stenosis by prior echocardiogram 09/11/2017  . Metabolic syndrome 99/37/1696  . Hypogonadism in  male 08/14/2016  . BMI 31.0-31.9,adult 03/13/2016  . Atypical chest pain 08/29/2015  . Essential hypertension 08/29/2015  . Diverticulosis of colon without hemorrhage 08/01/2015  . Statin intolerance 08/01/2015  . Basal cell carcinoma of nose 08/23/2014  . Rosacea 08/23/2014  . Seasonal allergies 01/26/2013  . Diabetes mellitus type 2, uncomplicated ([place]) 09/25/7251  . Hyperlipidemia associated with type 2 diabetes mellitus ([place]) 07/09/2012  . Erectile dysfunction 07/09/2012    Past Medical History:  Diagnosis Date  . Allergy   . Basal cell carcinoma 01/2013   Nasal and R forearm.  [facility]  . CAD S/P percutaneous coronary angioplasty     2/19 PCI/DESx1 to mLAD, FFR 0.7 -[place] DES 2.75 mm x 18 mm  . Cataract   . Diabetes mellitus without complication (HCC)    on PO Meds  . Essential hypertension   . Hyperlipidemia    statin intolerant -- myalgias, fatigue (tried at least 4)  . Rosacea     Past Surgical History:  Procedure Laterality Date  . basal cell removal     nose and wrist   . BELPHAROPTOSIS REPAIR    . CORONARY CT ANGIOGRAM  09/2017   Coronary Calcium Score: 111. Coronary calcification noted in the LEFT MAIN (LM) and prox LAD.  LM < 50% calcified stenosis.  Mid LAD 50-75% plaque noted CT FFR --> positive at 0.80.  Recommend CARDIAC CATHETERIZATION  . CORONARY STENT INTERVENTION N/A 11/12/2017   Procedure: CORONARY STENT INTERVENTION;  Surgeon: Leonie Man, MD;  Location: [facility];  Service: Cardiovascular:  DES PCI:  STENT SIERRA 2.75 X 18 MM - Post intervention, there is a 0% residual stenosis.  Marland Kitchen ETT/GXT: Graded Exercise Tolerance Test  08/2015    Exercised for 9:01 min --> blunted blood pressure response no EKG changes.  No chest pain.  Low Risk  . EYE SURGERY     Cataract surgery  . eyelid surgery    . INTRAVASCULAR PRESSURE WIRE/FFR STUDY N/A 11/12/2017   Procedure: INTRAVASCULAR PRESSURE WIRE/FFR STUDY;  Surgeon: Leonie Man, MD;  Location: [facility];  Service: Cardiovascular;  Laterality: LAD -FFR 0.76  . LEFT HEART CATH AND CORONARY ANGIOGRAPHY N/A 11/12/2017   Procedure: LEFT HEART CATH AND CORONARY ANGIOGRAPHY;  Surgeon: Leonie Man, MD;  Location: [facility]: 70% P-M LAD (FFR 0.76).  40% mid LAD, 50% ostial ramus.  EF 55-60%.  Mildly elevated LVEDP.  Marland Kitchen SHOULDER OPEN ROTATOR CUFF REPAIR  1983  . TRANSTHORACIC ECHOCARDIOGRAM  08/2017   EF 55-60%.  Mild aortic stenosis (mean gradient 11 mmH.)  GR 1 DD.  Mild LA dilation.    Social History   Socioeconomic History  . Marital status: Married    Spouse name: [name]  . Number of children: 4  . Years of education:  41  . Highest education level: Not on file  Occupational History  . Occupation: bus Engineer, manufacturing systems: [employer]  . Financial resource strain: Not on file  . Food insecurity:    Worry: Not on file    Inability: Not on file  . Transportation needs:    Medical: Not on file    Non-medical: Not on file  Tobacco Use  . Smoking status: Former Smoker    Packs/day: 1.00    Years: 15.00    Pack years: 15.00    Last attempt to quit: 07/09/1982    Years since quitting: 36.2  .  Smokeless tobacco: Never Used  Substance and Sexual Activity  . Alcohol use: No  . Drug use: No  . Sexual activity: Yes    Partners: Female  Lifestyle  . Physical activity:    Days per week: Not on file    Minutes per session: Not on file  . Stress: Not on file  Relationships  . Social connections:    Talks on phone: Not on file    Gets together: Not on file    Attends religious service: Not on file    Active member of club or organization: Not on file    Attends meetings of clubs or organizations: Not on file    Relationship status: Not on file  . Intimate partner violence:    Fear of current or ex partner: Not on file    Emotionally abused: Not on file    Physically abused: Not on file    Forced sexual activity: Not on file  Other Topics Concern  . Not on file  Social History Narrative   Marital status: married x 35 years; happily married.       Children:  4 children; 4 grandchildren. Adult children all live locally.      Lives: with wife      Employment: retired from bus transportation; PRN driving now activity bus. [employer]  Chicken farming.      Tobacco:  In past; smoked x 15 years; quit 35 years ago.      Alcohol: none      Exercise:sporadic in 2017.      ADLs: independent with all ADLs.  Does not use assistant device with ambulation.      Living Will:  Has living will; desires FULL CODE; no prolonged measures.      Doctoral degree in  Biblical Studies.        Family History  Problem Relation Age of Onset  . Leukemia Mother   . Stroke Father 7       as a complication of CABG  . Colon cancer Father 44  . Cancer Father 56       Colon cancer  . Coronary artery disease Father 28       - Sx was DOE - MV CAD  . Peripheral Artery Disease Father        presumptive  . Diabetes Sister   . Sudden Cardiac Death Sister 2       presumed MI  . Heart attack Brother 81       During throat surgery  . Congestive Heart Failure Brother        Died @ 58 - ? CHF / ICM  . Cancer Maternal Grandmother   . Stroke Maternal Grandfather   . Diabetes Brother 64       on lots of meds  . Diabetes Brother   . Cancer Brother        colon cancer  . Rheumatic fever Brother        childhood RF --> Valvular Dz - valve Sgx,  died young     Review of Systems  Constitutional: Negative.  Negative for chills, fever and weight loss.  HENT: Negative.  Negative for hearing loss and sore throat.   Eyes: Negative.  Negative for blurred vision and double vision.  Respiratory: Negative.  Negative for cough, hemoptysis, shortness of breath and wheezing.   Cardiovascular: Negative.  Negative for chest pain and palpitations.  Gastrointestinal: Negative.  Negative for abdominal pain, diarrhea, nausea and  vomiting.  Genitourinary: Negative.  Negative for dysuria and hematuria.  Musculoskeletal: Negative.  Negative for back pain, joint pain, myalgias and neck pain.  Skin: Negative.  Negative for rash.  Neurological: Negative.  Negative for dizziness, sensory change, focal weakness and headaches.  Endo/Heme/Allergies: Negative.   All other systems reviewed and are negative.   Vitals:   10/01/18 1034  BP: 125/72  Pulse: 65  Resp: 16  Temp: 98.3 F (36.8 C)  SpO2: 95%    Physical Exam Vitals signs reviewed.  Constitutional:      Appearance: Normal appearance.  HENT:     Head: Normocephalic and atraumatic.     Nose: Nose normal.      Mouth/Throat:     Mouth: Mucous membranes are moist.     Pharynx: Oropharynx is clear.  Eyes:     Extraocular Movements: Extraocular movements intact.     Conjunctiva/sclera: Conjunctivae normal.     Pupils: Pupils are equal, round, and reactive to light.  Neck:     Musculoskeletal: Normal range of motion and neck supple.  Cardiovascular:     Rate and Rhythm: Normal rate and regular rhythm.     Pulses: Normal pulses.     Heart sounds: Normal heart sounds.  Pulmonary:     Breath sounds: Normal breath sounds.  Abdominal:     General: Bowel sounds are normal. There is no distension.     Palpations: Abdomen is soft. There is no mass.     Tenderness: There is no abdominal tenderness.  Musculoskeletal: Normal range of motion.  Skin:    General: Skin is warm and dry.     Capillary Refill: Capillary refill takes less than 2 seconds.  Neurological:     General: No focal deficit present.     Mental Status: He is alert and oriented to person, place, and time.  Psychiatric:        Mood and Affect: Mood normal.        Behavior: Behavior normal.    Results for orders placed or performed in visit on 10/01/18 (from the past 24 hour(s))  POCT glucose (manual entry)     Status: Abnormal   Collection Time: 10/01/18 11:20 AM  Result Value Ref Range   POC Glucose 106 (A) 70 - 99 mg/dl  POCT urinalysis dipstick     Status: None   Collection Time: 10/01/18 11:21 AM  Result Value Ref Range   Color, UA yellow yellow   Clarity, UA clear clear   Glucose, UA negative negative mg/dL   Bilirubin, UA negative negative   Ketones, POC UA negative negative mg/dL   Spec Grav, UA 1.010 1.010 - 1.025   Blood, UA negative negative   pH, UA 5.5 5.0 - 8.0   Protein Ur, POC negative negative mg/dL   Urobilinogen, UA 0.2 0.2 or 1.0 E.U./dL   Nitrite, UA Negative Negative   Leukocytes, UA Negative Negative  POCT glycosylated hemoglobin (Hb A1C)     Status: Abnormal   Collection Time: 10/01/18 11:25 AM    Result Value Ref Range   Hemoglobin A1C 6.9 (A) 4.0 - 5.6 %   HbA1c POC (<> result, manual entry)     HbA1c, POC (prediabetic range)     HbA1c, POC (controlled diabetic range)       ASSESSMENT & PLAN: Essential hypertension Well-controlled.  Continue present medications and follow-up in 6 months.  Diabetes mellitus type 2, uncomplicated (HCC) Hemoglobin A1c at 6.9 today.  Better than last time.  Continue present medications and follow-up in 6 months.  Leith was seen today for annual exam and diabetes.  Diagnoses and all orders for this visit:  Encounter for general adult medical examination with abnormal findings -     POCT urinalysis dipstick  Type 2 diabetes mellitus with hyperglycemia, without long-term current use of insulin (HCC) -     POCT glucose (manual entry) -     POCT glycosylated hemoglobin (Hb A1C) -     Comprehensive metabolic panel -     CBC -     TSH -     HM Diabetes Foot Exam -     glipiZIDE (GLUCOTROL) 5 MG tablet; Take 1 tablet (5 mg total) by mouth 2 (two) times daily before a meal.  Essential hypertension -     Comprehensive metabolic panel -     CBC -     losartan (COZAAR) 25 MG tablet; Take 1 tablet (25 mg total) by mouth daily.  Dyslipidemia -     Lipid panel  Prostate cancer screening -     PSA  Screening for endocrine, metabolic and immunity disorder -     POCT urinalysis dipstick    Patient Instructions       If you have lab work done today you will be contacted with your lab results within the next 2 weeks.  If you have not heard from [place] then please contact us. The fastest way to get your results is to register for My Chart.   IF you received an x-ray today, you will receive an invoice from [facility]. Please contact [facility] at [phone] with questions or concerns regarding your invoice.   IF you received labwork today, you will receive an invoice from [place]. Please contact [facility] at [phone]  with questions or concerns regarding your invoice.   Our billing staff will not be able to assist you with questions regarding bills from these companies.  You will be contacted with the lab results as soon as they are available. The fastest way to get your results is to activate your My Chart account. Instructions are located on the last page of this paperwork. If you have not heard from [place] regarding the results in 2 weeks, please contact this office.      Health Maintenance, Male A healthy lifestyle and preventive care is important for your health and wellness. Ask your health care provider about what schedule of regular examinations is right for you. What should I know about weight and diet? Eat a Healthy Diet  Eat plenty of vegetables, fruits, whole grains, low-fat dairy products, and lean protein.  Do not eat a lot of foods high in solid fats, added sugars, or salt.  Maintain a Healthy Weight Regular exercise can help you achieve or maintain a healthy weight. You should:  Do at least 150 minutes of exercise each week. The exercise should increase your heart rate and make you sweat (moderate-intensity exercise).  Do strength-training exercises at least twice a week. Watch Your Levels of Cholesterol and Blood Lipids  Have your blood tested for lipids and cholesterol every 5 years starting at 71 years of age. If you are at high risk for heart disease, you should start having your blood tested when you are 71 years old. You may need to have your cholesterol levels checked more often if: ? Your lipid or cholesterol levels are high. ? You are older than 71 years of age. ? You are at high risk  for heart disease. What should I know about cancer screening? Many types of cancers can be detected early and may often be prevented. Lung Cancer  You should be screened every year for lung cancer if: ? You are a current smoker who has smoked for at least 30 years. ? You are a former smoker  who has quit within the past 15 years.  Talk to your health care provider about your screening options, when you should start screening, and how often you should be screened. Colorectal Cancer  Routine colorectal cancer screening usually begins at 71 years of age and should be repeated every 5-10 years until you are 71 years old. You may need to be screened more often if early forms of precancerous polyps or small growths are found. Your health care provider may recommend screening at an earlier age if you have risk factors for colon cancer.  Your health care provider may recommend using home test kits to check for hidden blood in the stool.  A small camera at the end of a tube can be used to examine your colon (sigmoidoscopy or colonoscopy). This checks for the earliest forms of colorectal cancer. Prostate and Testicular Cancer  Depending on your age and overall health, your health care provider may do certain tests to screen for prostate and testicular cancer.  Talk to your health care provider about any symptoms or concerns you have about testicular or prostate cancer. Skin Cancer  Check your skin from head to toe regularly.  Tell your health care provider about any new moles or changes in moles, especially if: ? There is a change in a mole's size, shape, or color. ? You have a mole that is larger than a pencil eraser.  Always use sunscreen. Apply sunscreen liberally and repeat throughout the day.  Protect yourself by wearing long sleeves, pants, a wide-brimmed hat, and sunglasses when outside. What should I know about heart disease, diabetes, and high blood pressure?  If you are 70-49 years of age, have your blood pressure checked every 3-5 years. If you are 73 years of age or older, have your blood pressure checked every year. You should have your blood pressure measured twice-once when you are at a hospital or clinic, and once when you are not at a hospital or clinic. Record the  average of the two measurements. To check your blood pressure when you are not at a hospital or clinic, you can use: ? An automated blood pressure machine at a pharmacy. ? A home blood pressure monitor.  Talk to your health care provider about your target blood pressure.  If you are between 25-69 years old, ask your health care provider if you should take aspirin to prevent heart disease.  Have regular diabetes screenings by checking your fasting blood sugar level. ? If you are at a normal weight and have a low risk for diabetes, have this test once every three years after the age of 21. ? If you are overweight and have a high risk for diabetes, consider being tested at a younger age or more often.  A one-time screening for abdominal aortic aneurysm (AAA) by ultrasound is recommended for men aged 71-75 years who are current or former smokers. What should I know about preventing infection? Hepatitis B If you have a higher risk for hepatitis B, you should be screened for this virus. Talk with your health care provider to find out if you are at risk for hepatitis B infection. Hepatitis C  Blood testing is recommended for:  Everyone born from [birth year] through 1963.  Anyone with known risk factors for hepatitis C. Sexually Transmitted Diseases (STDs)  You should be screened each year for STDs including gonorrhea and chlamydia if: ? You are sexually active and are younger than 71 years of age. ? You are older than 71 years of age and your health care provider tells you that you are at risk for this type of infection. ? Your sexual activity has changed since you were last screened and you are at an increased risk for chlamydia or gonorrhea. Ask your health care provider if you are at risk.  Talk with your health care provider about whether you are at high risk of being infected with HIV. Your health care provider may recommend a prescription medicine to help prevent HIV infection. What else can I  do?  Schedule regular health, dental, and eye exams.  Stay current with your vaccines (immunizations).  Do not use any tobacco products, such as cigarettes, chewing tobacco, and e-cigarettes. If you need help quitting, ask your health care provider.  Limit alcohol intake to no more than 2 drinks per day. One drink equals 12 ounces of beer, 5 ounces of wine, or 1 ounces of hard liquor.  Do not use street drugs.  Do not share needles.  Ask your health care provider for help if you need support or information about quitting drugs.  Tell your health care provider if you often feel depressed.  Tell your health care provider if you have ever been abused or do not feel safe at home. This information is not intended to replace advice given to you by your health care provider. Make sure you discuss any questions you have with your health care provider. Document Released: 03/07/2008 Document Revised: 05/08/2016 Document Reviewed: 06/13/2015 Elsevier Interactive Patient Education  2019 [employer].  Diabetes Mellitus and Nutrition, Adult When you have diabetes (diabetes mellitus), it is very important to have healthy eating habits because your blood sugar (glucose) levels are greatly affected by what you eat and drink. Eating healthy foods in the appropriate amounts, at about the same times every day, can help you:  Control your blood glucose.  Lower your risk of heart disease.  Improve your blood pressure.  Reach or maintain a healthy weight. Every person with diabetes is different, and each person has different needs for a meal plan. Your health care provider may recommend that you work with a diet and nutrition specialist (dietitian) to make a meal plan that is best for you. Your meal plan may vary depending on factors such as:  The calories you need.  The medicines you take.  Your weight.  Your blood glucose, blood pressure, and cholesterol levels.  Your activity  level.  Other health conditions you have, such as heart or kidney disease. How do carbohydrates affect me? Carbohydrates, also called carbs, affect your blood glucose level more than any other type of food. Eating carbs naturally raises the amount of glucose in your blood. Carb counting is a method for keeping track of how many carbs you eat. Counting carbs is important to keep your blood glucose at a healthy level, especially if you use insulin or take certain oral diabetes medicines. It is important to know how many carbs you can safely have in each meal. This is different for every person. Your dietitian can help you calculate how many carbs you should have at each meal and for each snack. Foods  that contain carbs include:  Bread, cereal, rice, pasta, and crackers.  Potatoes and corn.  Peas, beans, and lentils.  Milk and yogurt.  Fruit and juice.  Desserts, such as cakes, cookies, ice cream, and candy. How does alcohol affect me? Alcohol can cause a sudden decrease in blood glucose (hypoglycemia), especially if you use insulin or take certain oral diabetes medicines. Hypoglycemia can be a life-threatening condition. Symptoms of hypoglycemia (sleepiness, dizziness, and confusion) are similar to symptoms of having too much alcohol. If your health care provider says that alcohol is safe for you, follow these guidelines:  Limit alcohol intake to no more than 1 drink per day for nonpregnant women and 2 drinks per day for men. One drink equals 12 oz of beer, 5 oz of wine, or 1 oz of hard liquor.  Do not drink on an empty stomach.  Keep yourself hydrated with water, diet soda, or unsweetened iced tea.  Keep in mind that regular soda, juice, and other mixers may contain a lot of sugar and must be counted as carbs. What are tips for following this plan?  Reading food labels  Start by checking the serving size on the "Nutrition Facts" label of packaged foods and drinks. The amount of  calories, carbs, fats, and other nutrients listed on the label is based on one serving of the item. Many items contain more than one serving per package.  Check the total grams (g) of carbs in one serving. You can calculate the number of servings of carbs in one serving by dividing the total carbs by 15. For example, if a food has 30 g of total carbs, it would be equal to 2 servings of carbs.  Check the number of grams (g) of saturated and trans fats in one serving. Choose foods that have low or no amount of these fats.  Check the number of milligrams (mg) of salt (sodium) in one serving. Most people should limit total sodium intake to less than 2,300 mg per day.  Always check the nutrition information of foods labeled as "low-fat" or "nonfat". These foods may be higher in added sugar or refined carbs and should be avoided.  Talk to your dietitian to identify your daily goals for nutrients listed on the label. Shopping  Avoid buying canned, premade, or processed foods. These foods tend to be high in fat, sodium, and added sugar.  Shop around the outside edge of the grocery store. This includes fresh fruits and vegetables, bulk grains, fresh meats, and fresh dairy. Cooking  Use low-heat cooking methods, such as baking, instead of high-heat cooking methods like deep frying.  Cook using healthy oils, such as olive, canola, or sunflower oil.  Avoid cooking with butter, cream, or high-fat meats. Meal planning  Eat meals and snacks regularly, preferably at the same times every day. Avoid going long periods of time without eating.  Eat foods high in fiber, such as fresh fruits, vegetables, beans, and whole grains. Talk to your dietitian about how many servings of carbs you can eat at each meal.  Eat 4-6 ounces (oz) of lean protein each day, such as lean meat, chicken, fish, eggs, or tofu. One oz of lean protein is equal to: ? 1 oz of meat, chicken, or fish. ? 1 egg. ?  cup of tofu.  Eat  some foods each day that contain healthy fats, such as avocado, nuts, seeds, and fish. Lifestyle  Check your blood glucose regularly.  Exercise regularly as told by your  health care provider. This may include: ? 150 minutes of moderate-intensity or vigorous-intensity exercise each week. This could be brisk walking, biking, or water aerobics. ? Stretching and doing strength exercises, such as yoga or weightlifting, at least 2 times a week.  Take medicines as told by your health care provider.  Do not use any products that contain nicotine or tobacco, such as cigarettes and e-cigarettes. If you need help quitting, ask your health care provider.  Work with a Social worker or diabetes educator to identify strategies to manage stress and any emotional and social challenges. Questions to ask a health care provider  Do I need to meet with a diabetes educator?  Do I need to meet with a dietitian?  What number can I call if I have questions?  When are the best times to check my blood glucose? Where to find more information:  American Diabetes Association: diabetes.org  Academy of Nutrition and Dietetics: www.eatright.[employer] of Diabetes and Digestive and Kidney Diseases (NIH): [employer] Summary  A healthy meal plan will help you control your blood glucose and maintain a healthy lifestyle.  Working with a diet and nutrition specialist (dietitian) can help you make a meal plan that is best for you.  Keep in mind that carbohydrates (carbs) and alcohol have immediate effects on your blood glucose levels. It is important to count carbs and to use alcohol carefully. This information is not intended to replace advice given to you by your health care provider. Make sure you discuss any questions you have with your health care provider. Document Released: 06/06/2005 Document Revised: 04/09/2017 Document Reviewed: 10/14/2016 Elsevier Interactive Patient Education  2019  Elsevier Inc.      Agustina Caroli, MD [facility]

## 2018-10-01 NOTE — Assessment & Plan Note (Signed)
Well-controlled.  Continue present medications and follow-up in 6 months.

## 2018-10-01 NOTE — Patient Instructions (Addendum)
   If you have lab work done today you will be contacted with your lab results within the next 2 weeks.  If you have not heard from us then please contact us. The fastest way to get your results is to register for My Chart.   IF you received an x-ray today, you will receive an invoice from [facility]. Please contact [facility] at [phone] with questions or concerns regarding your invoice.   IF you received labwork today, you will receive an invoice from [facility]. Please contact [facility] at [phone] with questions or concerns regarding your invoice.   Our billing staff will not be able to assist you with questions regarding bills from these companies.  You will be contacted with the lab results as soon as they are available. The fastest way to get your results is to activate your My Chart account. Instructions are located on the last page of this paperwork. If you have not heard from us regarding the results in 2 weeks, please contact this office.      Health Maintenance, Male A healthy lifestyle and preventive care is important for your health and wellness. Ask your health care provider about what schedule of regular examinations is right for you. What should I know about weight and diet? Eat a Healthy Diet  Eat plenty of vegetables, fruits, whole grains, low-fat dairy products, and lean protein.  Do not eat a lot of foods high in solid fats, added sugars, or salt.  Maintain a Healthy Weight Regular exercise can help you achieve or maintain a healthy weight. You should:  Do at least 150 minutes of exercise each week. The exercise should increase your heart rate and make you sweat (moderate-intensity exercise).  Do strength-training exercises at least twice a week. Watch Your Levels of Cholesterol and Blood Lipids  Have your blood tested for lipids and cholesterol every 5 years starting at 71 years of age. If you are at high risk for heart disease, you  should start having your blood tested when you are 71 years old. You may need to have your cholesterol levels checked more often if: ? Your lipid or cholesterol levels are high. ? You are older than 71 years of age. ? You are at high risk for heart disease. What should I know about cancer screening? Many types of cancers can be detected early and may often be prevented. Lung Cancer  You should be screened every year for lung cancer if: ? You are a current smoker who has smoked for at least 30 years. ? You are a former smoker who has quit within the past 15 years.  Talk to your health care provider about your screening options, when you should start screening, and how often you should be screened. Colorectal Cancer  Routine colorectal cancer screening usually begins at 71 years of age and should be repeated every 5-10 years until you are 71 years old. You may need to be screened more often if early forms of precancerous polyps or small growths are found. Your health care provider may recommend screening at an earlier age if you have risk factors for colon cancer.  Your health care provider may recommend using home test kits to check for hidden blood in the stool.  A small camera at the end of a tube can be used to examine your colon (sigmoidoscopy or colonoscopy). This checks for the earliest forms of colorectal cancer. Prostate and Testicular Cancer  Depending on your age and   overall health, your health care provider may do certain tests to screen for prostate and testicular cancer.  Talk to your health care provider about any symptoms or concerns you have about testicular or prostate cancer. Skin Cancer  Check your skin from head to toe regularly.  Tell your health care provider about any new moles or changes in moles, especially if: ? There is a change in a mole's size, shape, or color. ? You have a mole that is larger than a pencil eraser.  Always use sunscreen. Apply sunscreen  liberally and repeat throughout the day.  Protect yourself by wearing long sleeves, pants, a wide-brimmed hat, and sunglasses when outside. What should I know about heart disease, diabetes, and high blood pressure?  If you are 18-39 years of age, have your blood pressure checked every 3-5 years. If you are 40 years of age or older, have your blood pressure checked every year. You should have your blood pressure measured twice-once when you are at a hospital or clinic, and once when you are not at a hospital or clinic. Record the average of the two measurements. To check your blood pressure when you are not at a hospital or clinic, you can use: ? An automated blood pressure machine at a pharmacy. ? A home blood pressure monitor.  Talk to your health care provider about your target blood pressure.  If you are between 45-79 years old, ask your health care provider if you should take aspirin to prevent heart disease.  Have regular diabetes screenings by checking your fasting blood sugar level. ? If you are at a normal weight and have a low risk for diabetes, have this test once every three years after the age of 45. ? If you are overweight and have a high risk for diabetes, consider being tested at a younger age or more often.  A one-time screening for abdominal aortic aneurysm (AAA) by ultrasound is recommended for men aged 65-75 years who are current or former smokers. What should I know about preventing infection? Hepatitis B If you have a higher risk for hepatitis B, you should be screened for this virus. Talk with your health care provider to find out if you are at risk for hepatitis B infection. Hepatitis C Blood testing is recommended for:  Everyone born from 1945 through 1965.  Anyone with known risk factors for hepatitis C. Sexually Transmitted Diseases (STDs)  You should be screened each year for STDs including gonorrhea and chlamydia if: ? You are sexually active and are younger  than 71 years of age. ? You are older than 71 years of age and your health care provider tells you that you are at risk for this type of infection. ? Your sexual activity has changed since you were last screened and you are at an increased risk for chlamydia or gonorrhea. Ask your health care provider if you are at risk.  Talk with your health care provider about whether you are at high risk of being infected with HIV. Your health care provider may recommend a prescription medicine to help prevent HIV infection. What else can I do?  Schedule regular health, dental, and eye exams.  Stay current with your vaccines (immunizations).  Do not use any tobacco products, such as cigarettes, chewing tobacco, and e-cigarettes. If you need help quitting, ask your health care provider.  Limit alcohol intake to no more than 2 drinks per day. One drink equals 12 ounces of beer, 5 ounces of wine,   or 1 ounces of hard liquor.  Do not use street drugs.  Do not share needles.  Ask your health care provider for help if you need support or information about quitting drugs.  Tell your health care provider if you often feel depressed.  Tell your health care provider if you have ever been abused or do not feel safe at home. This information is not intended to replace advice given to you by your health care provider. Make sure you discuss any questions you have with your health care provider. Document Released: 03/07/2008 Document Revised: 05/08/2016 Document Reviewed: 06/13/2015 Elsevier Interactive Patient Education  2019 [employer].  Diabetes Mellitus and Nutrition, Adult When you have diabetes (diabetes mellitus), it is very important to have healthy eating habits because your blood sugar (glucose) levels are greatly affected by what you eat and drink. Eating healthy foods in the appropriate amounts, at about the same times every day, can help you:  Control your blood glucose.  Lower your risk of  heart disease.  Improve your blood pressure.  Reach or maintain a healthy weight. Every person with diabetes is different, and each person has different needs for a meal plan. Your health care provider may recommend that you work with a diet and nutrition specialist (dietitian) to make a meal plan that is best for you. Your meal plan may vary depending on factors such as:  The calories you need.  The medicines you take.  Your weight.  Your blood glucose, blood pressure, and cholesterol levels.  Your activity level.  Other health conditions you have, such as heart or kidney disease. How do carbohydrates affect me? Carbohydrates, also called carbs, affect your blood glucose level more than any other type of food. Eating carbs naturally raises the amount of glucose in your blood. Carb counting is a method for keeping track of how many carbs you eat. Counting carbs is important to keep your blood glucose at a healthy level, especially if you use insulin or take certain oral diabetes medicines. It is important to know how many carbs you can safely have in each meal. This is different for every person. Your dietitian can help you calculate how many carbs you should have at each meal and for each snack. Foods that contain carbs include:  Bread, cereal, rice, pasta, and crackers.  Potatoes and corn.  Peas, beans, and lentils.  Milk and yogurt.  Fruit and juice.  Desserts, such as cakes, cookies, ice cream, and candy. How does alcohol affect me? Alcohol can cause a sudden decrease in blood glucose (hypoglycemia), especially if you use insulin or take certain oral diabetes medicines. Hypoglycemia can be a life-threatening condition. Symptoms of hypoglycemia (sleepiness, dizziness, and confusion) are similar to symptoms of having too much alcohol. If your health care provider says that alcohol is safe for you, follow these guidelines:  Limit alcohol intake to no more than 1 drink per day  for nonpregnant women and 2 drinks per day for men. One drink equals 12 oz of beer, 5 oz of wine, or 1 oz of hard liquor.  Do not drink on an empty stomach.  Keep yourself hydrated with water, diet soda, or unsweetened iced tea.  Keep in mind that regular soda, juice, and other mixers may contain a lot of sugar and must be counted as carbs. What are tips for following this plan?  Reading food labels  Start by checking the serving size on the "Nutrition Facts" label of packaged foods and  drinks. The amount of calories, carbs, fats, and other nutrients listed on the label is based on one serving of the item. Many items contain more than one serving per package.  Check the total grams (g) of carbs in one serving. You can calculate the number of servings of carbs in one serving by dividing the total carbs by 15. For example, if a food has 30 g of total carbs, it would be equal to 2 servings of carbs.  Check the number of grams (g) of saturated and trans fats in one serving. Choose foods that have low or no amount of these fats.  Check the number of milligrams (mg) of salt (sodium) in one serving. Most people should limit total sodium intake to less than 2,300 mg per day.  Always check the nutrition information of foods labeled as "low-fat" or "nonfat". These foods may be higher in added sugar or refined carbs and should be avoided.  Talk to your dietitian to identify your daily goals for nutrients listed on the label. Shopping  Avoid buying canned, premade, or processed foods. These foods tend to be high in fat, sodium, and added sugar.  Shop around the outside edge of the grocery store. This includes fresh fruits and vegetables, bulk grains, fresh meats, and fresh dairy. Cooking  Use low-heat cooking methods, such as baking, instead of high-heat cooking methods like deep frying.  Cook using healthy oils, such as olive, canola, or sunflower oil.  Avoid cooking with butter, cream, or  high-fat meats. Meal planning  Eat meals and snacks regularly, preferably at the same times every day. Avoid going long periods of time without eating.  Eat foods high in fiber, such as fresh fruits, vegetables, beans, and whole grains. Talk to your dietitian about how many servings of carbs you can eat at each meal.  Eat 4-6 ounces (oz) of lean protein each day, such as lean meat, chicken, fish, eggs, or tofu. One oz of lean protein is equal to: ? 1 oz of meat, chicken, or fish. ? 1 egg. ?  cup of tofu.  Eat some foods each day that contain healthy fats, such as avocado, nuts, seeds, and fish. Lifestyle  Check your blood glucose regularly.  Exercise regularly as told by your health care provider. This may include: ? 150 minutes of moderate-intensity or vigorous-intensity exercise each week. This could be brisk walking, biking, or water aerobics. ? Stretching and doing strength exercises, such as yoga or weightlifting, at least 2 times a week.  Take medicines as told by your health care provider.  Do not use any products that contain nicotine or tobacco, such as cigarettes and e-cigarettes. If you need help quitting, ask your health care provider.  Work with a Social worker or diabetes educator to identify strategies to manage stress and any emotional and social challenges. Questions to ask a health care provider  Do I need to meet with a diabetes educator?  Do I need to meet with a dietitian?  What number can I call if I have questions?  When are the best times to check my blood glucose? Where to find more information:  American Diabetes Association: diabetes.org  Academy of Nutrition and Dietetics: www.eatright.[employer] of Diabetes and Digestive and Kidney Diseases (NIH): [employer] Summary  A healthy meal plan will help you control your blood glucose and maintain a healthy lifestyle.  Working with a diet and nutrition specialist (dietitian) can help  you make a meal plan that is  best for you.  Keep in mind that carbohydrates (carbs) and alcohol have immediate effects on your blood glucose levels. It is important to count carbs and to use alcohol carefully. This information is not intended to replace advice given to you by your health care provider. Make sure you discuss any questions you have with your health care provider. Document Released: 06/06/2005 Document Revised: 04/09/2017 Document Reviewed: 10/14/2016 Elsevier Interactive Patient Education  2019 [employer].

## 2018-10-02 LAB — PSA: PROSTATE SPECIFIC AG, SERUM: 1.1 ng/mL (ref 0.0–4.0)

## 2018-10-02 LAB — COMPREHENSIVE METABOLIC PANEL
ALK PHOS: 54 IU/L (ref 39–117)
ALT: 35 IU/L (ref 0–44)
AST: 30 IU/L (ref 0–40)
Albumin/Globulin Ratio: 1.8 (ref 1.2–2.2)
Albumin: 4.8 g/dL (ref 3.5–4.8)
BUN/Creatinine Ratio: 15 (ref 10–24)
BUN: 14 mg/dL (ref 8–27)
Bilirubin Total: 0.6 mg/dL (ref 0.0–1.2)
CO2: 21 mmol/L (ref 20–29)
Calcium: 10.1 mg/dL (ref 8.6–10.2)
Chloride: 102 mmol/L (ref 96–106)
Creatinine, Ser: 0.94 mg/dL (ref 0.76–1.27)
GFR calc Af Amer: 95 mL/min/{1.73_m2} (ref >59)
GFR calc non Af Amer: 82 mL/min/{1.73_m2} (ref >59)
Globulin, Total: 2.6 g/dL (ref 1.5–4.5)
Glucose: 104 mg/dL — ABNORMAL HIGH (ref 65–99)
POTASSIUM: 4.4 mmol/L (ref 3.5–5.2)
Sodium: 140 mmol/L (ref 134–144)
Total Protein: 7.4 g/dL (ref 6.0–8.5)

## 2018-10-02 LAB — LIPID PANEL
Chol/HDL Ratio: 4.2 ratio (ref 0.0–5.0)
Cholesterol, Total: 180 mg/dL (ref 100–199)
HDL: 43 mg/dL (ref >39)
LDL Calculated: 111 mg/dL — ABNORMAL HIGH (ref 0–99)
Triglycerides: 130 mg/dL (ref 0–149)
VLDL Cholesterol Cal: 26 mg/dL (ref 5–40)

## 2018-10-02 LAB — CBC
HEMOGLOBIN: 13.4 g/dL (ref 13.0–17.7)
Hematocrit: 39.3 % (ref 37.5–51.0)
MCH: 30.5 pg (ref 26.6–33.0)
MCHC: 34.1 g/dL (ref 31.5–35.7)
MCV: 89 fL (ref 79–97)
Platelets: 207 10*3/uL (ref 150–450)
RBC: 4.4 x10E6/uL (ref 4.14–5.80)
RDW: 12.7 % (ref 11.6–15.4)
WBC: 8.4 10*3/uL (ref 3.4–10.8)

## 2018-10-02 LAB — TSH: TSH: 1.92 u[IU]/mL (ref 0.450–4.500)

## 2018-10-08 IMAGING — CT CT HEART SCORING
2 series · 16 of 20 positions shown, 18 images · non-contrast
Comparison: None.

CLINICAL DATA: Risk stratification

EXAM:
Coronary Calcium Score
TECHNIQUE: The patient was scanned on a Siemens Somatom 64 slice scanner. Axial
non-contrast 3 mm slices were carried out through the heart. The
data set was analyzed on a dedicated work station and scored using
the Agatson method.

[Series 3: casc 3.0 i36f 2 bestdiast 66 % · axial · 0.37mm/px · z∈[-259,-175]mm · 8 of 36 slices shown, 10 images]
[im 4/36  vessel]
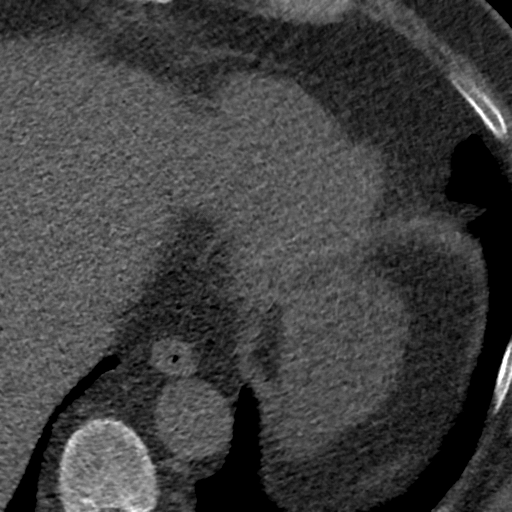
[im 4/36  lung]
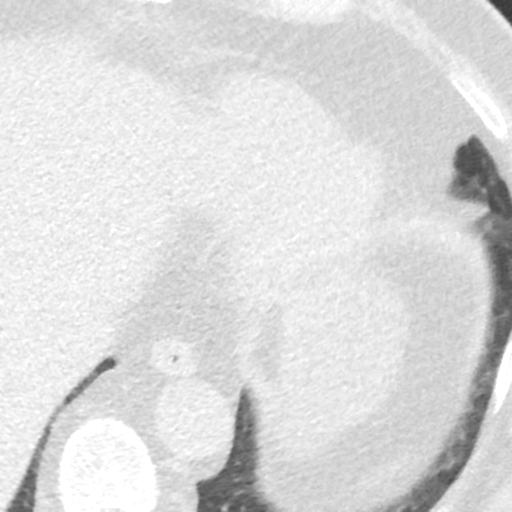
[im 8/36  vessel]
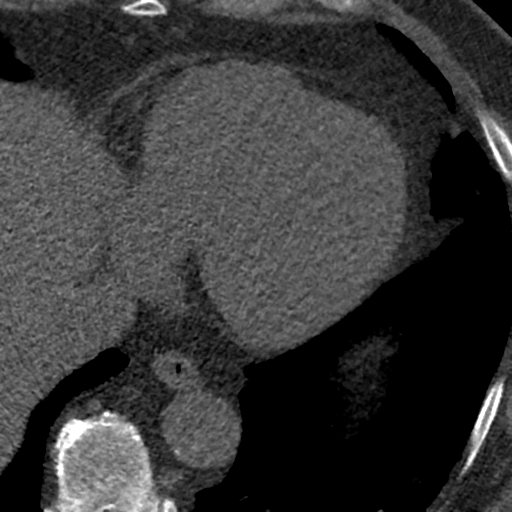
[im 12/36  vessel]
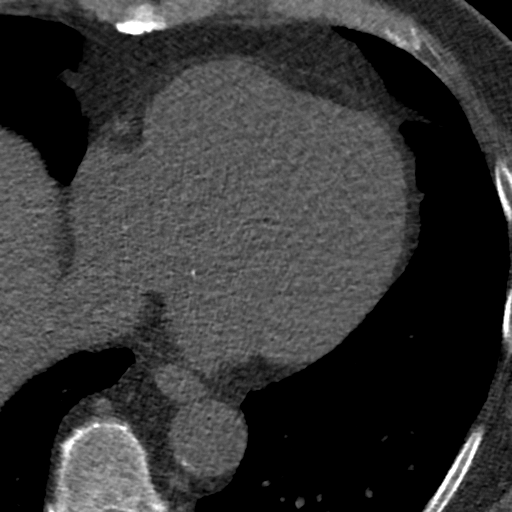
[im 16/36  vessel]
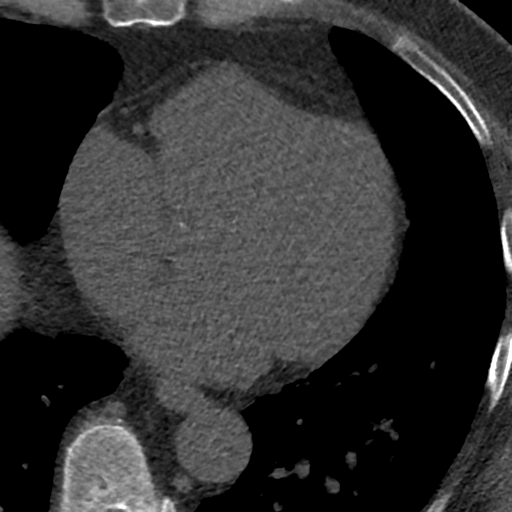
[im 20/36  vessel]
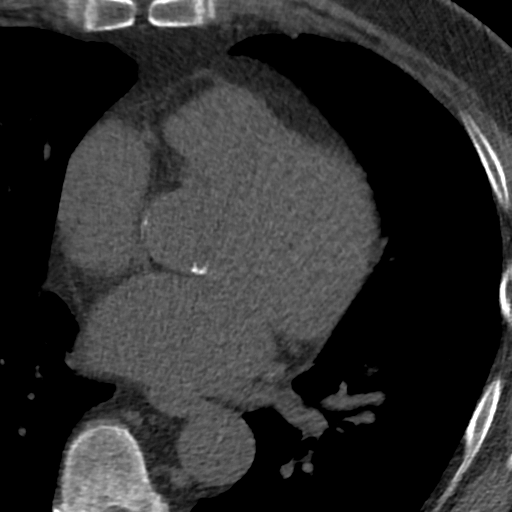
[im 20/36  lung]
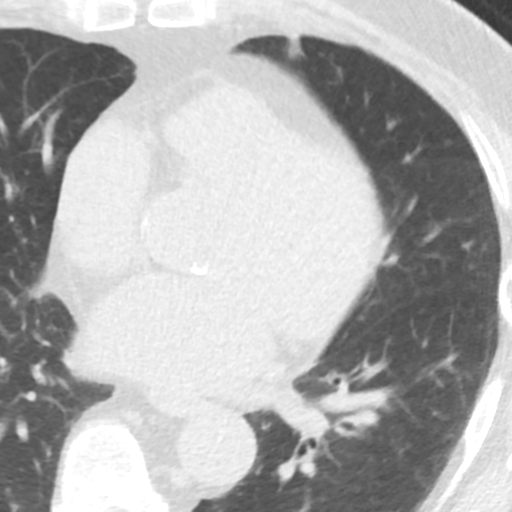
[im 24/36  vessel]
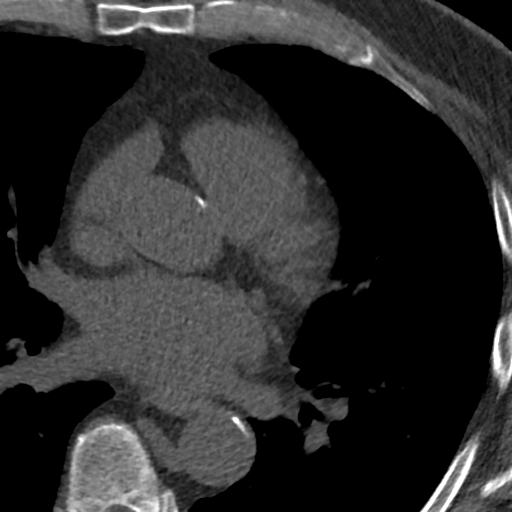
[im 28/36  vessel]
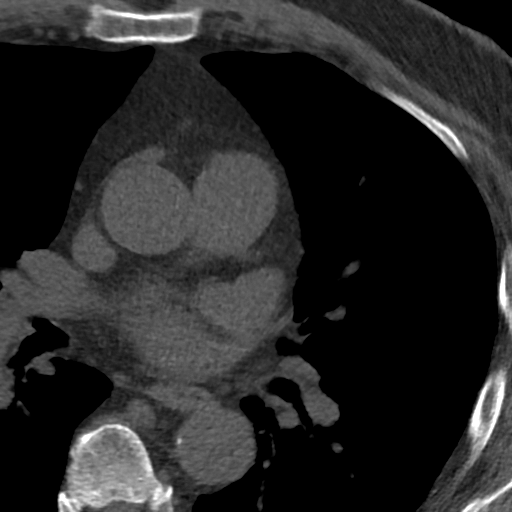
[im 32/36  vessel]
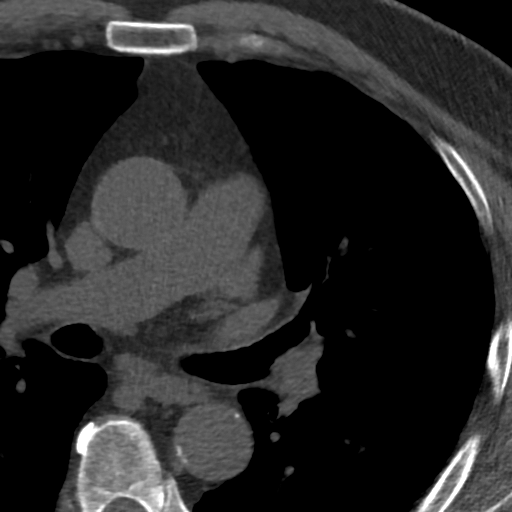

[Series 5: lung st 65 % · axial · 0.74mm/px · z∈[-262,-178]mm · 8 of 38 slices shown]
[im 5/38  lung]
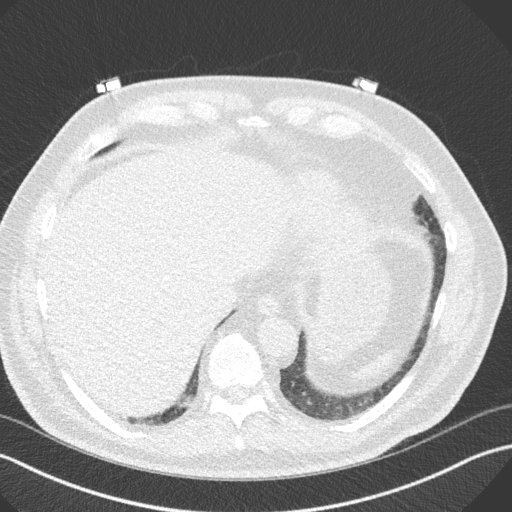
[im 9/38  lung]
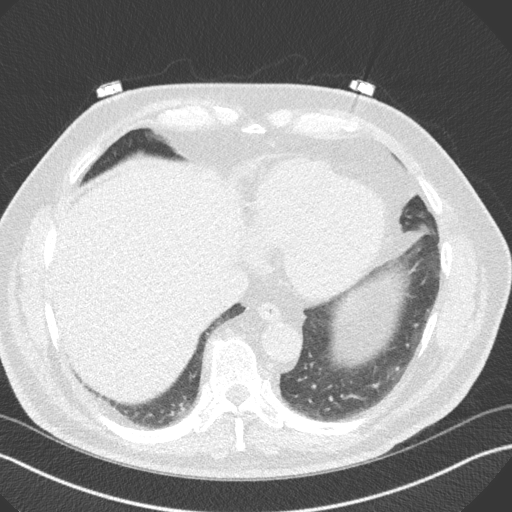
[im 13/38  lung]
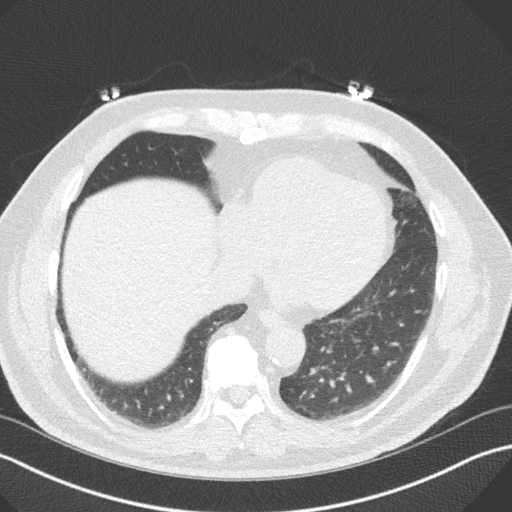
[im 17/38  lung]
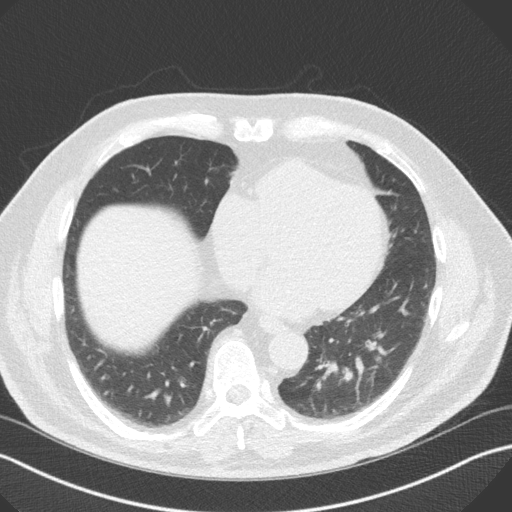
[im 21/38  lung]
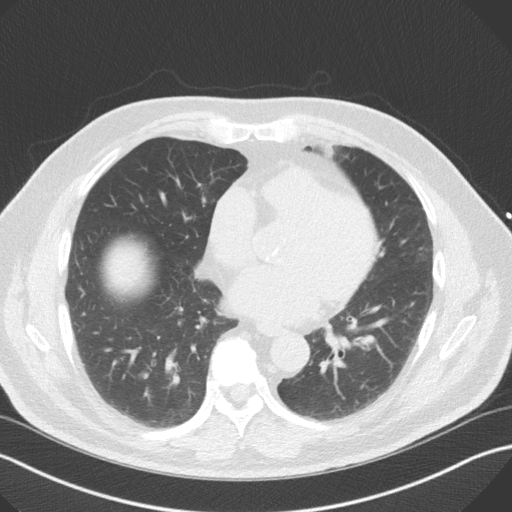
[im 25/38  lung]
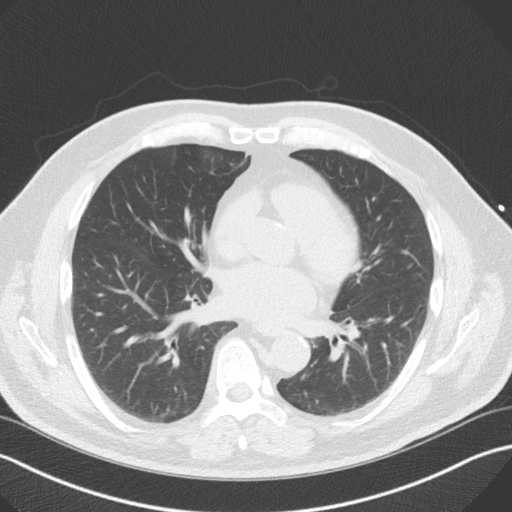
[im 29/38  lung]
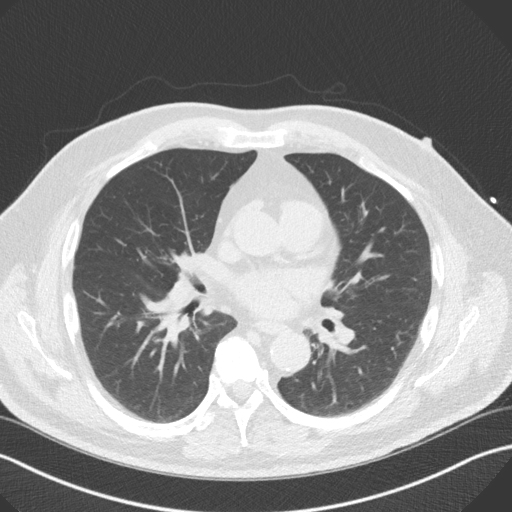
[im 33/38  lung]
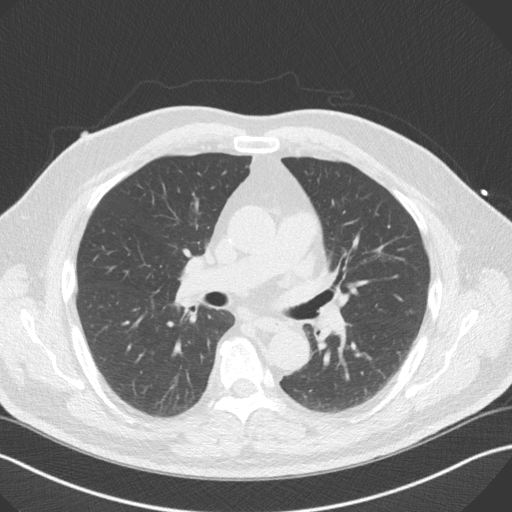

[16 of 20 positions shown; findings below may reference images not displayed]

FINDINGS: Non-cardiac: See separate report from [REDACTED].

Ascending Aorta:  Aortic atherosclerosisi normal diameter 3.5 cm

Pericardium: Normal

Coronary arteries:  Dense calcium noted in LM and ostial LAD
IMPRESSION: Coronary calcium score of 111. This was 64 th percentile for age and
sex matched control.

Parth Rankine

EXAM:
OVER-READ INTERPRETATION  CT CHEST

The following report is an over-read performed by radiologist Dr.
over-read does not include interpretation of cardiac or coronary
anatomy or pathology. The coronary calcium score interpretation by
the cardiologist is attached.
FINDINGS: Limited view of the lung parenchyma demonstrates no suspicious
nodularity. Airways are normal.

Limited view of the mediastinum demonstrates no adenopathy.
Esophagus normal.

Limited view of the upper abdomen unremarkable.

Limited view of the skeleton and chest wall is unremarkable.
IMPRESSION: No significant extracardiac findings

## 2018-12-08 ENCOUNTER — Telehealth: Payer: Self-pay | Admitting: Adult Health

## 2018-12-08 NOTE — Telephone Encounter (Signed)
Patient called in, states that he would like to know if any changes should be made regarding his blood work.   PCP did blood work 09/2018, please review. Patient does not want to do any type of medication if possible.  Also, he would like to know if he should follow up with you any time soon. Last OV was with Jory Sims 05/2018. Advised to follow up with you in 4 months. Advised I would check with Dr.Harding to make sure nothing else should be completed or changed due to recent blood work.

## 2018-12-08 NOTE — Telephone Encounter (Signed)
New message   Patient wants to know if he should have lab work done. Please advise.

## 2018-12-09 NOTE — Telephone Encounter (Signed)
Called patient, LVM to advise of message from MD. Left call back number.

## 2018-12-09 NOTE — Telephone Encounter (Signed)
So - I last saw him in Feb 2019 -- was doing well.  In light of the COVID issue - can probably delay f/u until May-June.  As for his Lipids - LDL was 111 last time --> target is < 70.     He had been seen on a few occasions by Raquel in [facility] & they had discussed a PCSK-9 inhibitor (Repatha or Praluent).   With his intolerance of other medications (& I am not sure that Vascepa helped), PCSK9-I are the only options. It is up to him of course, but Lipids are his main risk factor.  Glenetta Hew, MD

## 2018-12-11 NOTE — Telephone Encounter (Signed)
Possible Repatha?   Please advise.

## 2018-12-11 NOTE — Telephone Encounter (Signed)
Thank you.  I will contact him as soon as we can schedule OV for lipid management.

## 2018-12-11 NOTE — Telephone Encounter (Signed)
As stated by DR Charlesetta Garibaldi is the best alternative , but patient refsed  it in the past.  We don't need to see him in clinic at this time.  If patient willing to try it Repatha, I will start paperwork next week for PA and insurance approval.

## 2018-12-11 NOTE — Telephone Encounter (Signed)
Called patient, he was made aware of Dr.Hardings message, as well as PharmD. Patient would like to have an appointment made (when they become available) to see PharmD to discuss the Repatha again, and decide from there.   Will route to scheduling- as well as PharmD to make aware.

## 2019-01-12 NOTE — Telephone Encounter (Signed)
Talked to Ms Huhn today. He will like to "research" Repatha before giving me and answer about initiating PA or not.   Waiting for patient to call back and let me know if okay to start paperwork for [facility].

## 2019-02-15 ENCOUNTER — Encounter: Payer: Self-pay | Admitting: Gastroenterology

## 2019-03-05 ENCOUNTER — Other Ambulatory Visit: Payer: Self-pay | Admitting: Physician Assistant

## 2019-04-07 ENCOUNTER — Other Ambulatory Visit: Payer: Self-pay

## 2019-04-07 ENCOUNTER — Ambulatory Visit (INDEPENDENT_AMBULATORY_CARE_PROVIDER_SITE_OTHER): Payer: Medicare Other | Admitting: Emergency Medicine

## 2019-04-07 ENCOUNTER — Encounter: Payer: Self-pay | Admitting: Emergency Medicine

## 2019-04-07 VITALS — BP 142/78 | HR 68 | Temp 98.2°F | Resp 16 | Ht 68.0 in | Wt 216.0 lb

## 2019-04-07 DIAGNOSIS — Z1211 Encounter for screening for malignant neoplasm of colon: Secondary | ICD-10-CM | POA: Diagnosis not present

## 2019-04-07 DIAGNOSIS — E785 Hyperlipidemia, unspecified: Secondary | ICD-10-CM | POA: Diagnosis not present

## 2019-04-07 DIAGNOSIS — I10 Essential (primary) hypertension: Secondary | ICD-10-CM | POA: Diagnosis not present

## 2019-04-07 DIAGNOSIS — E1165 Type 2 diabetes mellitus with hyperglycemia: Secondary | ICD-10-CM | POA: Diagnosis not present

## 2019-04-07 LAB — POCT GLYCOSYLATED HEMOGLOBIN (HGB A1C): Hemoglobin A1C: 7.8 % — AB (ref 4.0–5.6)

## 2019-04-07 LAB — GLUCOSE, POCT (MANUAL RESULT ENTRY): POC Glucose: 153 mg/dl — AB (ref 70–99)

## 2019-04-07 MED ORDER — JARDIANCE 10 MG PO TABS: 10.0000 mg | ORAL_TABLET | Freq: Every day | ORAL | 1 refills | Status: AC

## 2019-04-07 NOTE — Patient Instructions (Addendum)
   If you have lab work done today you will be contacted with your lab results within the next 2 weeks.  If you have not heard from us then please contact us. The fastest way to get your results is to register for My Chart.   IF you received an x-ray today, you will receive an invoice from [facility]. Please contact [facility] at [phone] with questions or concerns regarding your invoice.   IF you received labwork today, you will receive an invoice from [facility]. Please contact [facility] at [phone] with questions or concerns regarding your invoice.   Our billing staff will not be able to assist you with questions regarding bills from these companies.  You will be contacted with the lab results as soon as they are available. The fastest way to get your results is to activate your My Chart account. Instructions are located on the last page of this paperwork. If you have not heard from us regarding the results in 2 weeks, please contact this office.     Diabetes Mellitus and Nutrition, Adult When you have diabetes (diabetes mellitus), it is very important to have healthy eating habits because your blood sugar (glucose) levels are greatly affected by what you eat and drink. Eating healthy foods in the appropriate amounts, at about the same times every day, can help you:  Control your blood glucose.  Lower your risk of heart disease.  Improve your blood pressure.  Reach or maintain a healthy weight. Every person with diabetes is different, and each person has different needs for a meal plan. Your health care provider may recommend that you work with a diet and nutrition specialist (dietitian) to make a meal plan that is best for you. Your meal plan may vary depending on factors such as:  The calories you need.  The medicines you take.  Your weight.  Your blood glucose, blood pressure, and cholesterol levels.  Your activity level.  Other health conditions  you have, such as heart or kidney disease. How do carbohydrates affect me? Carbohydrates, also called carbs, affect your blood glucose level more than any other type of food. Eating carbs naturally raises the amount of glucose in your blood. Carb counting is a method for keeping track of how many carbs you eat. Counting carbs is important to keep your blood glucose at a healthy level, especially if you use insulin or take certain oral diabetes medicines. It is important to know how many carbs you can safely have in each meal. This is different for every person. Your dietitian can help you calculate how many carbs you should have at each meal and for each snack. Foods that contain carbs include:  Bread, cereal, rice, pasta, and crackers.  Potatoes and corn.  Peas, beans, and lentils.  Milk and yogurt.  Fruit and juice.  Desserts, such as cakes, cookies, ice cream, and candy. How does alcohol affect me? Alcohol can cause a sudden decrease in blood glucose (hypoglycemia), especially if you use insulin or take certain oral diabetes medicines. Hypoglycemia can be a life-threatening condition. Symptoms of hypoglycemia (sleepiness, dizziness, and confusion) are similar to symptoms of having too much alcohol. If your health care provider says that alcohol is safe for you, follow these guidelines:  Limit alcohol intake to no more than 1 drink per day for nonpregnant women and 2 drinks per day for men. One drink equals 12 oz of beer, 5 oz of wine, or 1 oz of hard liquor.    Do not drink on an empty stomach.  Keep yourself hydrated with water, diet soda, or unsweetened iced tea.  Keep in mind that regular soda, juice, and other mixers may contain a lot of sugar and must be counted as carbs. What are tips for following this plan?  Reading food labels  Start by checking the serving size on the "Nutrition Facts" label of packaged foods and drinks. The amount of calories, carbs, fats, and other  nutrients listed on the label is based on one serving of the item. Many items contain more than one serving per package.  Check the total grams (g) of carbs in one serving. You can calculate the number of servings of carbs in one serving by dividing the total carbs by 15. For example, if a food has 30 g of total carbs, it would be equal to 2 servings of carbs.  Check the number of grams (g) of saturated and trans fats in one serving. Choose foods that have low or no amount of these fats.  Check the number of milligrams (mg) of salt (sodium) in one serving. Most people should limit total sodium intake to less than 2,300 mg per day.  Always check the nutrition information of foods labeled as "low-fat" or "nonfat". These foods may be higher in added sugar or refined carbs and should be avoided.  Talk to your dietitian to identify your daily goals for nutrients listed on the label. Shopping  Avoid buying canned, premade, or processed foods. These foods tend to be high in fat, sodium, and added sugar.  Shop around the outside edge of the grocery store. This includes fresh fruits and vegetables, bulk grains, fresh meats, and fresh dairy. Cooking  Use low-heat cooking methods, such as baking, instead of high-heat cooking methods like deep frying.  Cook using healthy oils, such as olive, canola, or sunflower oil.  Avoid cooking with butter, cream, or high-fat meats. Meal planning  Eat meals and snacks regularly, preferably at the same times every day. Avoid going long periods of time without eating.  Eat foods high in fiber, such as fresh fruits, vegetables, beans, and whole grains. Talk to your dietitian about how many servings of carbs you can eat at each meal.  Eat 4-6 ounces (oz) of lean protein each day, such as lean meat, chicken, fish, eggs, or tofu. One oz of lean protein is equal to: ? 1 oz of meat, chicken, or fish. ? 1 egg. ?  cup of tofu.  Eat some foods each day that contain  healthy fats, such as avocado, nuts, seeds, and fish. Lifestyle  Check your blood glucose regularly.  Exercise regularly as told by your health care provider. This may include: ? 150 minutes of moderate-intensity or vigorous-intensity exercise each week. This could be brisk walking, biking, or water aerobics. ? Stretching and doing strength exercises, such as yoga or weightlifting, at least 2 times a week.  Take medicines as told by your health care provider.  Do not use any products that contain nicotine or tobacco, such as cigarettes and e-cigarettes. If you need help quitting, ask your health care provider.  Work with a counselor or diabetes educator to identify strategies to manage stress and any emotional and social challenges. Questions to ask a health care provider  Do I need to meet with a diabetes educator?  Do I need to meet with a dietitian?  What number can I call if I have questions?  When are the best times to   check my blood glucose? Where to find more information:  American Diabetes Association: diabetes.org  Academy of Nutrition and Dietetics: www.eatright.org  National Institute of Diabetes and Digestive and Kidney Diseases (NIH): www.niddk.nih.gov Summary  A healthy meal plan will help you control your blood glucose and maintain a healthy lifestyle.  Working with a diet and nutrition specialist (dietitian) can help you make a meal plan that is best for you.  Keep in mind that carbohydrates (carbs) and alcohol have immediate effects on your blood glucose levels. It is important to count carbs and to use alcohol carefully. This information is not intended to replace advice given to you by your health care provider. Make sure you discuss any questions you have with your health care provider. Document Released: 06/06/2005 Document Revised: 08/22/2017 Document Reviewed: 10/14/2016 Elsevier Patient Education  2020 [employer]  

## 2019-04-07 NOTE — Assessment & Plan Note (Signed)
Uncontrolled diabetes with hemoglobin A1c at 7.8 today, higher than last time.  Patient is intolerant to both metformin and Januvia.  Presently taking glipizide 5 mg twice a day.  No episodes of hypoglycemia.  We will start a sodium glucose co-transporter 2 inhibitor as per formulary.  Diet and exercise encouraged.  Follow-up in 3 months.

## 2019-04-07 NOTE — Progress Notes (Signed)
Lab Results  Component Value Date   HGBA1C 6.9 (A) 10/01/2018   Lab Results  Component Value Date   CHOL 180 10/01/2018   HDL 43 10/01/2018   LDLCALC 111 (H) 10/01/2018   TRIG 130 10/01/2018   CHOLHDL 4.2 10/01/2018   BP Readings from Last 3 Encounters:  04/07/19 (!) 156/75  10/01/18 125/72  08/22/18 (!) 149/76   Lab Results  Component Value Date   CREATININE 0.94 10/01/2018   BUN 14 10/01/2018   NA 140 10/01/2018   K 4.4 10/01/2018   CL 102 10/01/2018   CO2 21 10/01/2018   Gregory Coppinger Jr. 73 y.o.   Chief Complaint  Patient presents with  . Diabetes    follow up    HISTORY OF PRESENT ILLNESS: This is a 73 y.o. male with history of diabetes, hypertension and dyslipidemia as well as CAD and aortic valvular heart disease, here for follow-up.  Blood pressure at home has been within normal limits 120s over 60s.  Blood sugar has been in the 130s.  Patient has intolerance to both metformin and Januvia.  Presently taking glipizide 5 mg twice a day.  Has not been able to exercise as much. Wt Readings from Last 3 Encounters:  04/07/19 216 lb (98 kg)  10/01/18 217 lb 3.2 oz (98.5 kg)  08/22/18 217 lb 3.2 oz (98.5 kg)  Patient is also intolerant to statins.  Cardiologist has recommended Repatha.  Patient has decided not to take it. Has no complaints or medical concerns today.   HPI   Prior to Admission medications   Medication Sig Start Date End Date Taking? Authorizing Provider  aspirin 81 MG tablet Take 81 mg by mouth every other day.    Yes [provider]  blood glucose meter kit and supplies Use to test blood sugar daily. Dx: E11.9 10/11/15  Yes Tereasa Coop, PA-C  clopidogrel (PLAVIX) 75 MG tablet TAKE 1 TABLET BY MOUTH EVERY DAY 06/05/18  Yes Leonie Man, MD  Lancets MISC Use to test blood sugar daily. Dx code :E11.9 10/11/15  Yes Tereasa Coop, PA-C  losartan (COZAAR) 25 MG tablet Take 1 tablet (25 mg total) by mouth daily. 10/01/18  Yes Ashani Pumphrey,  Ines Bloomer, MD  metoprolol succinate (TOPROL-XL) 25 MG 24 hr tablet Take 1 tablet (25 mg total) by mouth daily. 05/18/18  Yes Lendon Colonel, NP  Multiple Vitamin (MULTIVITAMIN) tablet Take 1 tablet by mouth daily.   Yes [provider]  nitroGLYCERIN (NITROSTAT) 0.4 MG SL tablet Place 1 tablet (0.4 mg total) under the tongue every 5 (five) minutes x 3 doses as needed for chest pain. 01/19/18  Yes Leonie Man, MD  [facility] ULTRA test strip USE TO TEST BLOOD SUGAR ONCE DAILY 03/05/19  Yes Emilianna Barlowe, Ines Bloomer, MD  glipiZIDE (GLUCOTROL) 5 MG tablet Take 1 tablet (5 mg total) by mouth 2 (two) times daily before a meal. 10/01/18 12/30/18  Cressida Milford, Ines Bloomer, MD  Icosapent Ethyl 1 g CAPS Take 2 capsules (2 g total) by mouth 2 (two) times daily. Patient not taking: Reported on 08/22/2018 05/19/18   Leonie Man, MD  sitaGLIPtin (JANUVIA) 100 MG tablet Take 1 tablet (100 mg total) by mouth daily. 06/12/18 09/10/18  Horald Pollen, MD    Allergies  Allergen Reactions  . Januvia [Sitagliptin] Other (See Comments)    Gi upset and kidney problems  . Lisinopril Cough  . Statins Other (See Comments)    Severe leg pain &  fatigue -- tried at least 4 (even low dose)    Patient Active Problem List   Diagnosis Date Noted  . Uncircumcised male 03/11/2018  . Coronary artery disease involving native coronary artery of native heart without angina pectoris 11/04/2017  . Abnormal findings on diagnostic imaging of cardiovascular system 11/04/2017  . Agatston coronary artery calcium score between 100 and 199 09/22/2017  . Family history of early CAD 09/11/2017  . Mild aortic stenosis by prior echocardiogram 09/11/2017  . Metabolic syndrome 40/98/1191  . 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 BMI 31.0-31.9,adult 03/13/2016  . Essential hypertension 08/29/2015  . Diverticulosis of colon without hemorrhage 08/01/2015  . Statin intolerance 08/01/2015  . Basal cell carcinoma of nose  08/23/2014  . Rosacea 08/23/2014  . Seasonal allergies 01/26/2013  . Diabetes mellitus type 2, uncomplicated ([place]) 47/82/9562  . Hyperlipidemia associated with type 2 diabetes mellitus ([place]) 07/09/2012  . Erectile dysfunction 07/09/2012    Past Medical History:  Diagnosis Date  . Allergy   . Basal cell carcinoma 01/2013   Nasal and R forearm.  [facility]  . CAD S/P percutaneous coronary angioplasty    2/19 PCI/DESx1 to mLAD, FFR 0.7 -[place]ierra DES 2.75 mm x 18 mm  . Cataract   . Diabetes mellitus without complication (HCC)    on PO Meds  . Essential hypertension   . Hyperlipidemia    statin intolerant -- myalgias, fatigue (tried at least 4)  . Rosacea     Past Surgical History:  Procedure Laterality Date  . basal cell removal     nose and wrist   . BELPHAROPTOSIS REPAIR    . CORONARY CT ANGIOGRAM  09/2017   Coronary Calcium Score: 111. Coronary calcification noted in the LEFT MAIN (LM) and prox LAD.  LM < 50% calcified stenosis.  Mid LAD 50-75% plaque noted CT FFR --> positive at 0.80.  Recommend CARDIAC CATHETERIZATION  . CORONARY STENT INTERVENTION N/A 11/12/2017   Procedure: CORONARY STENT INTERVENTION;  Surgeon: Leonie Man, MD;  Location: [facility];  Service: Cardiovascular:  DES PCI:  STENT SIERRA 2.75 X 18 MM - Post intervention, there is a 0% residual stenosis.  Marland Kitchen ETT/GXT: Graded Exercise Tolerance Test  08/2015    Exercised for 9:01 min --> blunted blood pressure response no EKG changes.  No chest pain.  Low Risk  . EYE SURGERY     Cataract surgery  . eyelid surgery    . INTRAVASCULAR PRESSURE WIRE/FFR STUDY N/A 11/12/2017   Procedure: INTRAVASCULAR PRESSURE WIRE/FFR STUDY;  Surgeon: Leonie Man, MD;  Location: [place] [facility];  Service: Cardiovascular;  Laterality: LAD -FFR 0.76  . LEFT HEART CATH AND CORONARY ANGIOGRAPHY N/A 11/12/2017   Procedure: LEFT HEART CATH AND CORONARY ANGIOGRAPHY;  Surgeon: Leonie Man, MD;  Location: [facility]  INVASIVE CV LAB: 70% P-M LAD (FFR 0.76).  40% mid LAD, 50% ostial ramus.  EF 55-60%.  Mildly elevated LVEDP.  Marland Kitchen SHOULDER OPEN ROTATOR CUFF REPAIR  1983  . TRANSTHORACIC ECHOCARDIOGRAM  08/2017   EF 55-60%.  Mild aortic stenosis (mean gradient 11 mmH.)  GR 1 DD.  Mild LA dilation.    Social History   Socioeconomic History  . Marital status: Married    Spouse name: [name]  . Number of children: 4  . Years of education: 23  . Highest education level: Not on file  Occupational History  . Occupation: bus Engineer, manufacturing systems: [employer]  .  Financial resource strain: Not on file  . Food insecurity    Worry: Not on file    Inability: Not on file  . Transportation needs    Medical: Not on file    Non-medical: Not on file  Tobacco Use  . Smoking status: Former Smoker    Packs/day: 1.00    Years: 15.00    Pack years: 15.00    Quit date: 07/09/1982    Years since quitting: 36.7  . Smokeless tobacco: Never Used  Substance and Sexual Activity  . Alcohol use: No  . Drug use: No  . Sexual activity: Yes    Partners: Female  Lifestyle  . Physical activity    Days per week: Not on file    Minutes per session: Not on file  . Stress: Not on file  Relationships  . Social Herbalist on phone: Not on file    Gets together: Not on file    Attends religious service: Not on file    Active member of club or organization: Not on file    Attends meetings of clubs or organizations: Not on file    Relationship status: Not on file  . Intimate partner violence    Fear of current or ex partner: Not on file    Emotionally abused: Not on file    Physically abused: Not on file    Forced sexual activity: Not on file  Other Topics Concern  . Not on file  Social History Narrative   Marital status: married x 44 years; happily married.       Children:  4 children; 4 grandchildren. Adult children all live locally.      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      Tobacco:  In past; smoked x 15 years; quit 35 years ago.      Alcohol: none      Exercise:sporadic in 2017.      ADLs: independent with all ADLs.  Does not use assistant device with ambulation.      Living Will:  Has living will; desires FULL CODE; no prolonged measures.      Doctoral degree in Biblical Studies.        Family History  Problem Relation Age of Onset  . Leukemia Mother   . Stroke Father 95       as a complication of CABG  . Colon cancer Father 31  . Cancer Father 76       Colon cancer  . Coronary artery disease Father 41       - Sx was DOE - MV CAD  . Peripheral Artery Disease Father        presumptive  . Diabetes Sister   . Sudden Cardiac Death Sister 80       presumed MI  . Heart attack Brother 81       During throat surgery  . Congestive Heart Failure Brother        Died @ 55 - ? CHF / ICM  . Cancer Maternal Grandmother   . Stroke Maternal Grandfather   . Diabetes Brother 64       on lots of meds  . Diabetes Brother   . Cancer Brother        colon cancer  . Rheumatic fever Brother        childhood RF --> Valvular Dz -  valve Sgx,  died young     Review of Systems  Constitutional: Negative.  Negative for chills and fever.  HENT: Negative.   Eyes: Negative.  Negative for blurred vision and double vision.  Respiratory: Negative.  Negative for cough and shortness of breath.   Cardiovascular: Negative.  Negative for chest pain and palpitations.  Gastrointestinal: Negative.  Negative for abdominal pain, diarrhea, nausea and vomiting.  Genitourinary: Negative.   Musculoskeletal: Negative.  Negative for back pain, myalgias and neck pain.  Skin: Negative.  Negative for rash.  Neurological: Negative.  Negative for dizziness and headaches.  Endo/Heme/Allergies: Negative.   All other systems reviewed and are negative.    Physical Exam Vitals  signs reviewed.  Constitutional:      Appearance: Normal appearance.  HENT:     Head: Normocephalic and atraumatic.  Eyes:     Extraocular Movements: Extraocular movements intact.     Conjunctiva/sclera: Conjunctivae normal.     Pupils: Pupils are equal, round, and reactive to light.  Neck:     Musculoskeletal: Normal range of motion and neck supple.  Cardiovascular:     Rate and Rhythm: Normal rate and regular rhythm.     Heart sounds: Murmur present. Systolic murmur present with a grade of 3/6.  Pulmonary:     Effort: Pulmonary effort is normal.     Breath sounds: Normal breath sounds.  Musculoskeletal: Normal range of motion.  Skin:    General: Skin is warm and dry.     Capillary Refill: Capillary refill takes less than 2 seconds.  Neurological:     General: No focal deficit present.     Mental Status: He is alert and oriented to person, place, and time.  Psychiatric:        Mood and Affect: Mood normal.        Behavior: Behavior normal.    Results for orders placed or performed in visit on 04/07/19 (from the past 24 hour(s))  POCT glucose (manual entry)     Status: Abnormal   Collection Time: 04/07/19  8:17 AM  Result Value Ref Range   POC Glucose 153 (A) 70 - 99 mg/dl  POCT glycosylated hemoglobin (Hb A1C)     Status: Abnormal   Collection Time: 04/07/19  8:22 AM  Result Value Ref Range   Hemoglobin A1C 7.8 (A) 4.0 - 5.6 %   HbA1c POC (<> result, manual entry)     HbA1c, POC (prediabetic range)     HbA1c, POC (controlled diabetic range)       ASSESSMENT & PLAN: Type 2 diabetes mellitus with hyperglycemia, without long-term current use of insulin (HCC) Uncontrolled diabetes with hemoglobin A1c at 7.8 today, higher than last time.  Patient is intolerant to both metformin and Januvia.  Presently taking glipizide 5 mg twice a day.  No episodes of hypoglycemia.  We will start a sodium glucose co-transporter 2 inhibitor as per formulary.  Diet and exercise encouraged.   Follow-up in 3 months.  [place] was seen today for diabetes.  Diagnoses and all orders for this visit:  Type 2 diabetes mellitus with hyperglycemia, without long-term current use of insulin (HCC) -     POCT glycosylated hemoglobin (Hb A1C) -     POCT glucose (manual entry) -     Comprehensive metabolic panel -     CBC with Differential/Platelet -     empagliflozin (JARDIANCE) 10 MG TABS tablet; Take 10 mg by mouth daily.  Essential hypertension -  Comprehensive metabolic panel -     CBC with Differential/Platelet  Dyslipidemia -     Lipid panel  Colon cancer screening -     Cologuard    Patient Instructions       If you have lab work done today you will be contacted with your lab results within the next 2 weeks.  If you have not heard from [place] then please contact us. The fastest way to get your results is to register for My Chart.   IF you received an x-ray today, you will receive an invoice from [facility]. Please contact [facility] at [phone] with questions or concerns regarding your invoice.   IF you received labwork today, you will receive an invoice from [place]. Please contact [facility] at [phone] with questions or concerns regarding your invoice.   Our billing staff will not be able to assist you with questions regarding bills from these companies.  You will be contacted with the lab results as soon as they are available. The fastest way to get your results is to activate your My Chart account. Instructions are located on the last page of this paperwork. If you have not heard from [place] regarding the results in 2 weeks, please contact this office.      Diabetes Mellitus and Nutrition, Adult When you have diabetes (diabetes mellitus), it is very important to have healthy eating habits because your blood sugar (glucose) levels are greatly affected by what you eat and drink. Eating healthy foods in the appropriate amounts, at about the  same times every day, can help you:  Control your blood glucose.  Lower your risk of heart disease.  Improve your blood pressure.  Reach or maintain a healthy weight. Every person with diabetes is different, and each person has different needs for a meal plan. Your health care provider may recommend that you work with a diet and nutrition specialist (dietitian) to make a meal plan that is best for you. Your meal plan may vary depending on factors such as:  The calories you need.  The medicines you take.  Your weight.  Your blood glucose, blood pressure, and cholesterol levels.  Your activity level.  Other health conditions you have, such as heart or kidney disease. How do carbohydrates affect me? Carbohydrates, also called carbs, affect your blood glucose level more than any other type of food. Eating carbs naturally raises the amount of glucose in your blood. Carb counting is a method for keeping track of how many carbs you eat. Counting carbs is important to keep your blood glucose at a healthy level, especially if you use insulin or take certain oral diabetes medicines. It is important to know how many carbs you can safely have in each meal. This is different for every person. Your dietitian can help you calculate how many carbs you should have at each meal and for each snack. Foods that contain carbs include:  Bread, cereal, rice, pasta, and crackers.  Potatoes and corn.  Peas, beans, and lentils.  Milk and yogurt.  Fruit and juice.  Desserts, such as cakes, cookies, ice cream, and candy. How does alcohol affect me? Alcohol can cause a sudden decrease in blood glucose (hypoglycemia), especially if you use insulin or take certain oral diabetes medicines. Hypoglycemia can be a life-threatening condition. Symptoms of hypoglycemia (sleepiness, dizziness, and confusion) are similar to symptoms of having too much alcohol. If your health care provider says that alcohol is safe  for you, follow these  guidelines:  Limit alcohol intake to no more than 1 drink per day for nonpregnant women and 2 drinks per day for men. One drink equals 12 oz of beer, 5 oz of wine, or 1 oz of hard liquor.  Do not drink on an empty stomach.  Keep yourself hydrated with water, diet soda, or unsweetened iced tea.  Keep in mind that regular soda, juice, and other mixers may contain a lot of sugar and must be counted as carbs. What are tips for following this plan?  Reading food labels  Start by checking the serving size on the "Nutrition Facts" label of packaged foods and drinks. The amount of calories, carbs, fats, and other nutrients listed on the label is based on one serving of the item. Many items contain more than one serving per package.  Check the total grams (g) of carbs in one serving. You can calculate the number of servings of carbs in one serving by dividing the total carbs by 15. For example, if a food has 30 g of total carbs, it would be equal to 2 servings of carbs.  Check the number of grams (g) of saturated and trans fats in one serving. Choose foods that have low or no amount of these fats.  Check the number of milligrams (mg) of salt (sodium) in one serving. Most people should limit total sodium intake to less than 2,300 mg per day.  Always check the nutrition information of foods labeled as "low-fat" or "nonfat". These foods may be higher in added sugar or refined carbs and should be avoided.  Talk to your dietitian to identify your daily goals for nutrients listed on the label. Shopping  Avoid buying canned, premade, or processed foods. These foods tend to be high in fat, sodium, and added sugar.  Shop around the outside edge of the grocery store. This includes fresh fruits and vegetables, bulk grains, fresh meats, and fresh dairy. Cooking  Use low-heat cooking methods, such as baking, instead of high-heat cooking methods like deep frying.  Cook using healthy  oils, such as olive, canola, or sunflower oil.  Avoid cooking with butter, cream, or high-fat meats. Meal planning  Eat meals and snacks regularly, preferably at the same times every day. Avoid going long periods of time without eating.  Eat foods high in fiber, such as fresh fruits, vegetables, beans, and whole grains. Talk to your dietitian about how many servings of carbs you can eat at each meal.  Eat 4-6 ounces (oz) of lean protein each day, such as lean meat, chicken, fish, eggs, or tofu. One oz of lean protein is equal to: ? 1 oz of meat, chicken, or fish. ? 1 egg. ?  cup of tofu.  Eat some foods each day that contain healthy fats, such as avocado, nuts, seeds, and fish. Lifestyle  Check your blood glucose regularly.  Exercise regularly as told by your health care provider. This may include: ? 150 minutes of moderate-intensity or vigorous-intensity exercise each week. This could be brisk walking, biking, or water aerobics. ? Stretching and doing strength exercises, such as yoga or weightlifting, at least 2 times a week.  Take medicines as told by your health care provider.  Do not use any products that contain nicotine or tobacco, such as cigarettes and e-cigarettes. If you need help quitting, ask your health care provider.  Work with a Social worker or diabetes educator to identify strategies to manage stress and any emotional and social challenges. Questions to ask  a health care provider  Do I need to meet with a diabetes educator?  Do I need to meet with a dietitian?  What number can I call if I have questions?  When are the best times to check my blood glucose? Where to find more information:  American Diabetes Association: diabetes.org  Academy of Nutrition and Dietetics: www.eatright.[employer] of Diabetes and Digestive and Kidney Diseases (NIH): [employer] Summary  A healthy meal plan will help you control your blood glucose and maintain a  healthy lifestyle.  Working with a diet and nutrition specialist (dietitian) can help you make a meal plan that is best for you.  Keep in mind that carbohydrates (carbs) and alcohol have immediate effects on your blood glucose levels. It is important to count carbs and to use alcohol carefully. This information is not intended to replace advice given to you by your health care provider. Make sure you discuss any questions you have with your health care provider. Document Released: 06/06/2005 Document Revised: 08/22/2017 Document Reviewed: 10/14/2016 Elsevier Patient Education  2020 Elsevier Inc.      Agustina Caroli, MD [facility]

## 2019-04-08 ENCOUNTER — Ambulatory Visit: Payer: Self-pay | Admitting: Emergency Medicine

## 2019-04-08 ENCOUNTER — Encounter: Payer: Self-pay | Admitting: Emergency Medicine

## 2019-04-08 VITALS — BP 146/76 | HR 75 | Temp 98.3°F | Ht 70.0 in | Wt 214.6 lb

## 2019-04-08 DIAGNOSIS — Z024 Encounter for examination for driving license: Secondary | ICD-10-CM

## 2019-04-08 LAB — COMPREHENSIVE METABOLIC PANEL
ALT: 37 IU/L (ref 0–44)
AST: 36 IU/L (ref 0–40)
Albumin/Globulin Ratio: 1.9 (ref 1.2–2.2)
Albumin: 5 g/dL — ABNORMAL HIGH (ref 3.7–4.7)
Alkaline Phosphatase: 62 IU/L (ref 39–117)
BUN/Creatinine Ratio: 24 (ref 10–24)
BUN: 22 mg/dL (ref 8–27)
Bilirubin Total: 0.6 mg/dL (ref 0.0–1.2)
CO2: 19 mmol/L — ABNORMAL LOW (ref 20–29)
Calcium: 10.5 mg/dL — ABNORMAL HIGH (ref 8.6–10.2)
Chloride: 101 mmol/L (ref 96–106)
Creatinine, Ser: 0.9 mg/dL (ref 0.76–1.27)
GFR calc Af Amer: 99 mL/min/{1.73_m2} (ref >59)
GFR calc non Af Amer: 86 mL/min/{1.73_m2} (ref >59)
Globulin, Total: 2.6 g/dL (ref 1.5–4.5)
Glucose: 168 mg/dL — ABNORMAL HIGH (ref 65–99)
Potassium: 5.2 mmol/L (ref 3.5–5.2)
Sodium: 139 mmol/L (ref 134–144)
Total Protein: 7.6 g/dL (ref 6.0–8.5)

## 2019-04-08 LAB — CBC WITH DIFFERENTIAL/PLATELET
Basophils Absolute: 0.1 10*3/uL (ref 0.0–0.2)
Basos: 1 %
EOS (ABSOLUTE): 0.4 10*3/uL (ref 0.0–0.4)
Eos: 5 %
Hematocrit: 42.8 % (ref 37.5–51.0)
Hemoglobin: 14.6 g/dL (ref 13.0–17.7)
Immature Grans (Abs): 0 10*3/uL (ref 0.0–0.1)
Immature Granulocytes: 0 %
Lymphocytes Absolute: 2.1 10*3/uL (ref 0.7–3.1)
Lymphs: 28 %
MCH: 31 pg (ref 26.6–33.0)
MCHC: 34.1 g/dL (ref 31.5–35.7)
MCV: 91 fL (ref 79–97)
Monocytes Absolute: 0.6 10*3/uL (ref 0.1–0.9)
Monocytes: 8 %
Neutrophils Absolute: 4.2 10*3/uL (ref 1.4–7.0)
Neutrophils: 58 %
Platelets: 217 10*3/uL (ref 150–450)
RBC: 4.71 x10E6/uL (ref 4.14–5.80)
RDW: 12.3 % (ref 11.6–15.4)
WBC: 7.4 10*3/uL (ref 3.4–10.8)

## 2019-04-08 LAB — LIPID PANEL
Chol/HDL Ratio: 4.7 ratio (ref 0.0–5.0)
Cholesterol, Total: 194 mg/dL (ref 100–199)
HDL: 41 mg/dL (ref >39)
LDL Calculated: 121 mg/dL — ABNORMAL HIGH (ref 0–99)
Triglycerides: 158 mg/dL — ABNORMAL HIGH (ref 0–149)
VLDL Cholesterol Cal: 32 mg/dL (ref 5–40)

## 2019-04-08 NOTE — Progress Notes (Signed)
BP Readings from Last 3 Encounters:  04/07/19 (!) 142/78  10/01/18 125/72  08/22/18 (!) 149/76       This patient presents for DOT examination for fitness for duty.   Medical History:  1. Head/Brain Injuries, disorders or illnesses no  2. Seizures, epilepsy no  3. Eye disorders or impaired vision (except corrective lenses) no  4. Ear disorders, loss of hearing or balance no  5. Heart disease or heart attack, other cardiovascular condition no  6. Heart surgery (valve replacement/bypass, angioplasty, pacemaker/defribrillator) yes  7. High blood pressure yes  8. High cholesterol yes  9. Chronic cough, shortness of breath or other breathing problems no  10. Lung disease (emphysema, asthma or chronic bronchitis) no  11. Kidney disease, dialysis no  12. Digestive problems  no  13. Diabetes or elevated blood sugar yes                      If yes to #13, Insulin use no  14. Nervious or psychiatric disorders, e.g., severe depression no  15. Fainting or syncope no  16. Dizziness, headaches, numbness, tingling or memory loss no  17. Unexplained weight loss no  18. Stroke, TIA or paralysis no  19. Missing or impaired hand, arm, foot, leg, finger, toe no  20. Spinal injury or disease no  21. Bone, muscles or nerve problems no  22. Blood clots or bleeding bleeding disorders no  23. Cancer no  24. Chronic infection or other chronic diseases no  25. Sleep disorders, pauses in breathing while asleep, daytime sleepiness, loud snoring no  26. Have you ever had a sleep test? no  27.  Have you ever spent a night in the hospital? yes  28. Have you ever had a broken bone? no  29. Have you or or do you use tobacco products? no  30. Regular, frequent alcohol use no  31. Illegal substance use within the past 2 years no  32.  Have you ever failed a drug test or been dependent on an illegal substance? no   Current Medications: Prior to Admission medications   Medication Sig Start Date End Date  Taking? Authorizing Provider  blood glucose meter kit and supplies Use to test blood sugar daily. Dx: E11.9 10/11/15  Yes Tereasa Coop, PA-C  clopidogrel (PLAVIX) 75 MG tablet TAKE 1 TABLET BY MOUTH EVERY DAY 06/05/18  Yes Leonie Man, MD  empagliflozin (JARDIANCE) 10 MG TABS tablet Take 10 mg by mouth daily. 04/07/19 07/06/19 Yes Saige Busby, Ines Bloomer, MD  glipiZIDE (GLUCOTROL) 5 MG tablet Take 1 tablet (5 mg total) by mouth 2 (two) times daily before a meal. 10/01/18 04/08/19 Yes Maely Clements, Ines Bloomer, MD  Lancets MISC Use to test blood sugar daily. Dx code :E11.9 10/11/15  Yes Tereasa Coop, PA-C  losartan (COZAAR) 25 MG tablet Take 1 tablet (25 mg total) by mouth daily. 10/01/18  Yes Beatris Belen, Ines Bloomer, MD  Multiple Vitamin (MULTIVITAMIN) tablet Take 1 tablet by mouth daily.   Yes [provider]  nitroGLYCERIN (NITROSTAT) 0.4 MG SL tablet Place 1 tablet (0.4 mg total) under the tongue every 5 (five) minutes x 3 doses as needed for chest pain. 01/19/18  Yes Leonie Man, MD  [facility] ULTRA test strip USE TO TEST BLOOD SUGAR ONCE DAILY 03/05/19  Yes Horald Pollen, MD  aspirin 81 MG tablet Take 81 mg by mouth every other day.     [provider]  Icosapent Ethyl  1 g CAPS Take 2 capsules (2 g total) by mouth 2 (two) times daily. Patient not taking: Reported on 08/22/2018 05/19/18   Leonie Man, MD  metoprolol succinate (TOPROL-XL) 25 MG 24 hr tablet Take 1 tablet (25 mg total) by mouth daily. Patient not taking: Reported on 04/08/2019 05/18/18   Lendon Colonel, NP  sitaGLIPtin (JANUVIA) 100 MG tablet Take 1 tablet (100 mg total) by mouth daily. 06/12/18 09/10/18  Horald Pollen, MD    Medical Examiner's Comments on Health History:  Stable chronic medical conditions.  On medications for diabetes and hypertension.  TESTING:   Hearing Screening   125Hz 250Hz 500Hz 1000Hz 2000Hz 3000Hz 4000Hz 6000Hz 8000Hz  Right ear:           Left ear:            Comments: The patient was able to hear a forced whisper from 6 feet.    Visual Acuity Screening   Right eye Left eye Both eyes  Without correction: 20/20 20/50 20/20  With correction:     Comments: The patient can distinguish the colors red, amber and green. Peripheral Vision: Right eye 70 degrees. Left eye 70 degrees.    Monocular Vision: No.  Hearing Aid used for test: No. Hearing Aid required to to meet standard: No.  BP (!) 146/76 (BP Location: Right Arm, Patient Position: Sitting, Cuff Size: Normal)   Pulse 75   Temp 98.3 F (36.8 C) (Oral)   Ht 5' 10" (1.778 m)   Wt 214 lb 9.6 oz (97.3 kg)   SpO2 96%   BMI 30.79 kg/m  Pulse rate is regular  Comments: SG: 1.015  Pro: Neg  Glu:2000+ Blood: Neg  PHYSICAL EXAMINATION:  General Appearance Not markedly obese. No tremor, signs of alcoholism, problem drinking or drug abuse.   Skin Warm, dry and intact.   Eyes Pupils are equal, round and reactive to light and accommodation, extraocular movements are intact. No exophthalmos, no nystagmus.  Ears Normal external ears. External canal without occlusion. No scarring of the TM. No perforation of the TM.  Mouth and Throat Clear and moist. No irremedial deformities likely to interfere with breathing or swallowing.  Heart No murmurs, extra sounds, evidence of cardiomegaly. No pacemaker. No implantable defibrillator.  Lungs and Chest (excluding breasts) Normal chest expansion, respiratory rate, breath sounds. No cyanosis.  Abdomen and Viscera No liver enlargement. No splenic enlargement. No masses, bruits, hernias or significant abdominal wall weakness.  Genitourinary  No inguinal or femoral hernia.  Spine and other musculoskeletal No tenderness, no limitation of motion, no deformities. No evidence of previous surgery.  Extremities No loss or impairment of leg, foot, toe, arm, hand, finger. No perceptible limp, deformities, atrophy, weakness, paralysis, clubbing, edema, hypotonia. Patient  has sufficient grasp and prehension to maintain steering wheel grip. Patient has sufficient mobility and strength in the lower limbs to operate pedals properly.  Neurologic Normal equilibrium, coordination, speech pattern. No paresthesia, asymmetry of deep tendon reflexes, sensory or positional abnormalities. No abnormality of patellar or Babinski's reflexes.  Gait Not antalgic or ataxic  Vascular Normal pulses. No carotid or arterial bruits. No varicose veins.     Certification Status:  Meets standards, but periodic monitoring required due to: DMT2 and HTN  Driver qualified only for: 1 year    Certification expires 04/07/2020

## 2019-04-08 NOTE — Patient Instructions (Signed)
° ° ° °  If you have lab work done today you will be contacted with your lab results within the next 2 weeks.  If you have not heard from us then please contact us. The fastest way to get your results is to register for My Chart. ° ° °IF you received an x-ray today, you will receive an invoice from [facility]. Please contact [facility] at [phone] with questions or concerns regarding your invoice.  ° °IF you received labwork today, you will receive an invoice from [facility]. Please contact [facility] at [phone] with questions or concerns regarding your invoice.  ° °Our billing staff will not be able to assist you with questions regarding bills from these companies. ° °You will be contacted with the lab results as soon as they are available. The fastest way to get your results is to activate your My Chart account. Instructions are located on the last page of this paperwork. If you have not heard from us regarding the results in 2 weeks, please contact this office. °  ° ° ° °

## 2019-04-15 ENCOUNTER — Telehealth: Payer: Self-pay | Admitting: Adult Health

## 2019-04-15 NOTE — Telephone Encounter (Signed)
LMTCB to schedule overdue follow-up appointment with Jory Sims, NP for Monday, 7/27.

## 2019-04-21 ENCOUNTER — Telehealth: Payer: Self-pay | Admitting: Adult Health

## 2019-04-21 NOTE — Telephone Encounter (Signed)
LVM, asking pt to call back and give consent for his Virtual Visit on 04-22-19.

## 2019-04-21 NOTE — Progress Notes (Signed)
Error, NO SHOW

## 2019-04-22 ENCOUNTER — Encounter: Payer: Self-pay | Admitting: Adult Health

## 2019-04-22 ENCOUNTER — Telehealth: Payer: Self-pay | Admitting: *Deleted

## 2019-04-22 ENCOUNTER — Encounter: Payer: Medicare Other | Admitting: Adult Health

## 2019-04-22 NOTE — Telephone Encounter (Signed)
I call pt for his virtual appt this morning, pt was driving, he was able to go over his medication but not his vital sign, pt wanted me to call back in 20 mins because that is as much time it will take for him to get home, I call pt back several times with no answer, as per Curt Bears pt will need to be reschedule.

## 2019-05-04 ENCOUNTER — Telehealth: Payer: Self-pay | Admitting: Emergency Medicine

## 2019-05-04 NOTE — Telephone Encounter (Signed)
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 Please advise.

## 2019-05-05 ENCOUNTER — Other Ambulatory Visit: Payer: Self-pay

## 2019-05-05 ENCOUNTER — Encounter: Payer: Self-pay | Admitting: Emergency Medicine

## 2019-05-05 ENCOUNTER — Telehealth (INDEPENDENT_AMBULATORY_CARE_PROVIDER_SITE_OTHER): Payer: Medicare Other | Admitting: Emergency Medicine

## 2019-05-05 DIAGNOSIS — R4582 Worries: Secondary | ICD-10-CM | POA: Diagnosis not present

## 2019-05-05 DIAGNOSIS — Z7189 Other specified counseling: Secondary | ICD-10-CM

## 2019-05-05 NOTE — Progress Notes (Signed)
Telemedicine Encounter- SOAP NOTE Established Patient  This telephone encounter was conducted with the patient's (or proxy's) verbal consent via audio telecommunications: yes/no: Yes Patient was instructed to have this encounter in a suitably private space; and to only have persons present to whom they give permission to participate. In addition, patient identity was confirmed by use of name plus two identifiers (DOB and address).  I discussed the limitations, risks, security and privacy concerns of performing an evaluation and management service by telephone and the availability of in person appointments. I also discussed with the patient that there may be a patient responsible charge related to this service. The patient expressed understanding and agreed to proceed.  I spent a total of TIME; 0 MIN TO 60 MIN: 10 minutes talking with the patient or their proxy.  No chief complaint on file. Concerns about COVID  Subjective   Gregory Mathews. is a 73 y.o. male established patient. Telephone visit today concerned about wife going back to school and potential exposure.  Patient has questions regarding corticosteroids and potential use.  Also asking about initial treatment if he ever catches the illness.  Asymptomatic at present time.  HPI   Patient Active Problem List   Diagnosis Date Noted  . Uncircumcised male 03/11/2018  . Coronary artery disease involving native coronary artery of native heart without angina pectoris 11/04/2017  . Abnormal findings on diagnostic imaging of cardiovascular system 11/04/2017  . Agatston coronary artery calcium score between 100 and 199 09/22/2017  . Family history of early CAD 09/11/2017  . Mild aortic stenosis by prior echocardiogram 09/11/2017  . Metabolic syndrome 41/66/0630  . 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 BMI 31.0-31.9,adult 03/13/2016  . Essential hypertension 08/29/2015  . Diverticulosis of colon without hemorrhage 08/01/2015  . Statin  intolerance 08/01/2015  . Basal cell carcinoma of nose 08/23/2014  . Rosacea 08/23/2014  . Seasonal allergies 01/26/2013  . Type 2 diabetes mellitus with hyperglycemia, without long-term current use of insulin ([place]) 07/09/2012  . Hyperlipidemia associated with type 2 diabetes mellitus ([place]) 07/09/2012  . Erectile dysfunction 07/09/2012    Past Medical History:  Diagnosis Date  . Allergy   . Basal cell carcinoma 01/2013   Nasal and R forearm.  [facility]  . CAD S/P percutaneous coronary angioplasty    2/19 PCI/DESx1 to mLAD, FFR 0.7 -[place] DES 2.75 mm x 18 mm  . Cataract   . Diabetes mellitus without complication (HCC)    on PO Meds  . Essential hypertension   . Hyperlipidemia    statin intolerant -- myalgias, fatigue (tried at least 4)  . Rosacea     Current Outpatient Medications  Medication Sig Dispense Refill  . clopidogrel (PLAVIX) 75 MG tablet TAKE 1 TABLET BY MOUTH EVERY DAY 90 tablet 3  . empagliflozin (JARDIANCE) 10 MG TABS tablet Take 10 mg by mouth daily. 90 tablet 1  . losartan (COZAAR) 25 MG tablet Take 1 tablet (25 mg total) by mouth daily. 90 tablet 3  . metoprolol succinate (TOPROL-XL) 25 MG 24 hr tablet Take 1 tablet (25 mg total) by mouth daily. 90 tablet 1  . Multiple Vitamin (MULTIVITAMIN) tablet Take 1 tablet by mouth daily.    . nitroGLYCERIN (NITROSTAT) 0.4 MG SL tablet Place 1 tablet (0.4 mg total) under the tongue every 5 (five) minutes x 3 doses as needed for chest pain. 25 tablet 3  . blood glucose meter kit and supplies Use to test blood sugar daily. Dx: E11.9  1 each 0  . glipiZIDE (GLUCOTROL) 5 MG tablet Take 1 tablet (5 mg total) by mouth 2 (two) times daily before a meal. 180 tablet 3  . Lancets MISC Use to test blood sugar daily. Dx code :E11.9 100 each 3  . ONETOUCH ULTRA test strip USE TO TEST BLOOD SUGAR ONCE DAILY 100 each 11   No current facility-administered medications for this visit.     Allergies  Allergen Reactions  .  Januvia [Sitagliptin] Other (See Comments)    Gi upset and kidney problems  . Lisinopril Cough  . Statins Other (See Comments)    Severe leg pain & fatigue -- tried at least 4 (even low dose)    Social History   Socioeconomic History  . Marital status: Married    Spouse name: [name]  . Number of children: 4  . Years of education: 29  . Highest education level: Not on file  Occupational History  . Occupation: bus Engineer, manufacturing systems: [employer]  . Financial resource strain: Not on file  . Food insecurity    Worry: Not on file    Inability: Not on file  . Transportation needs    Medical: Not on file    Non-medical: Not on file  Tobacco Use  . Smoking status: Former Smoker    Packs/day: 1.00    Years: 15.00    Pack years: 15.00    Quit date: 07/09/1982    Years since quitting: 36.8  . Smokeless tobacco: Never Used  Substance and Sexual Activity  . Alcohol use: No  . Drug use: No  . Sexual activity: Yes    Partners: Female  Lifestyle  . Physical activity    Days per week: Not on file    Minutes per session: Not on file  . Stress: Not on file  Relationships  . Social Herbalist on phone: Not on file    Gets together: Not on file    Attends religious service: Not on file    Active member of club or organization: Not on file    Attends meetings of clubs or organizations: Not on file    Relationship status: Not on file  . Intimate partner violence    Fear of current or ex partner: Not on file    Emotionally abused: Not on file    Physically abused: Not on file    Forced sexual activity: Not on file  Other Topics Concern  . Not on file  Social History Narrative   Marital status: married x 11 years; happily married.       Children:  4 children; 4 grandchildren. Adult children all live locally.      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      Tobacco:  In past; smoked x 15 years; quit 35 years ago.      Alcohol: none      Exercise:sporadic in 2017.      ADLs: independent with all ADLs.  Does not use assistant device with ambulation.      Living Will:  Has living will; desires FULL CODE; no prolonged measures.      Doctoral degree in Biblical Studies.        Review of Systems  Constitutional: Negative.  Negative for chills and fever.  HENT: Negative.  Negative for congestion and sore  throat.   Respiratory: Negative.  Negative for cough and shortness of breath.   Cardiovascular: Negative.  Negative for chest pain and palpitations.  Gastrointestinal: Negative.  Negative for abdominal pain, diarrhea, nausea and vomiting.  Genitourinary: Negative.  Negative for dysuria.  Musculoskeletal: Negative.  Negative for myalgias.  Skin: Negative.  Negative for rash.  Neurological: Negative.  Negative for dizziness and headaches.  All other systems reviewed and are negative.   Objective  Alert and oriented x3 in no apparent respiratory distress. Vitals as reported by the patient: There were no vitals filed for this visit.  There are no diagnoses linked to this encounter.  Diagnoses and all orders for this visit:  Worries  Advice Given About Covid-19 Virus by Telephone    COVID instructions given  I discussed the assessment and treatment plan with the patient. The patient was provided an opportunity to ask questions and all were answered. The patient agreed with the plan and demonstrated an understanding of the instructions.   The patient was advised to call back or seek an in-person evaluation if the symptoms worsen or if the condition fails to improve as anticipated.  I provided 10 minutes of non-face-to-face time during this encounter.  Horald Pollen, MD  Primary Care at [facility]

## 2019-05-05 NOTE — Telephone Encounter (Signed)
Not sure.  Send the forms that we usually send.  Thanks.

## 2019-05-05 NOTE — Telephone Encounter (Signed)
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  I see in the chart where you have noted approval for additional yr. Do I print that and send to his job?

## 2019-05-05 NOTE — Progress Notes (Signed)
Called patient to triage for appointment. Patient states his wife is returning back to work at [employer] and being around students. Patient states is it any preventive medications over the counter he can take because of [place]. Patient states he researched on the Internet, a doctor in [place] states certain steroids can be used to help with symptoms of [place]. Patient states he is just concerned.

## 2019-05-06 NOTE — Telephone Encounter (Signed)
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

## 2019-05-25 ENCOUNTER — Other Ambulatory Visit: Payer: Self-pay

## 2019-05-25 MED ORDER — METOPROLOL SUCCINATE ER 25 MG PO TB24: 25.0000 mg | ORAL_TABLET | Freq: Every day | ORAL | 1 refills | Status: DC

## 2019-06-12 ENCOUNTER — Other Ambulatory Visit: Payer: Self-pay | Admitting: Cardiology

## 2019-07-08 ENCOUNTER — Encounter: Payer: Self-pay | Admitting: Emergency Medicine

## 2019-07-08 ENCOUNTER — Other Ambulatory Visit: Payer: Self-pay

## 2019-07-08 ENCOUNTER — Ambulatory Visit (INDEPENDENT_AMBULATORY_CARE_PROVIDER_SITE_OTHER): Payer: Medicare Other | Admitting: Emergency Medicine

## 2019-07-08 VITALS — BP 146/74 | HR 63 | Temp 98.1°F | Resp 16 | Ht 69.0 in | Wt 212.2 lb

## 2019-07-08 DIAGNOSIS — E1165 Type 2 diabetes mellitus with hyperglycemia: Secondary | ICD-10-CM

## 2019-07-08 DIAGNOSIS — E1159 Type 2 diabetes mellitus with other circulatory complications: Secondary | ICD-10-CM | POA: Diagnosis not present

## 2019-07-08 DIAGNOSIS — I2510 Atherosclerotic heart disease of native coronary artery without angina pectoris: Secondary | ICD-10-CM | POA: Diagnosis not present

## 2019-07-08 DIAGNOSIS — Z23 Encounter for immunization: Secondary | ICD-10-CM | POA: Diagnosis not present

## 2019-07-08 DIAGNOSIS — I10 Essential (primary) hypertension: Secondary | ICD-10-CM | POA: Diagnosis not present

## 2019-07-08 DIAGNOSIS — I152 Hypertension secondary to endocrine disorders: Secondary | ICD-10-CM

## 2019-07-08 DIAGNOSIS — Z955 Presence of coronary angioplasty implant and graft: Secondary | ICD-10-CM

## 2019-07-08 LAB — POCT GLYCOSYLATED HEMOGLOBIN (HGB A1C): Hemoglobin A1C: 7.1 % — AB (ref 4.0–5.6)

## 2019-07-08 LAB — GLUCOSE, POCT (MANUAL RESULT ENTRY): POC Glucose: 125 mg/dl — AB (ref 70–99)

## 2019-07-08 MED ORDER — GLIPIZIDE 5 MG PO TABS: 5.0000 mg | ORAL_TABLET | Freq: Two times a day (BID) | ORAL | 3 refills | Status: DC

## 2019-07-08 MED ORDER — LANCETS MISC: 3 refills | Status: AC

## 2019-07-08 MED ORDER — METOPROLOL SUCCINATE ER 25 MG PO TB24: 25.0000 mg | ORAL_TABLET | Freq: Every day | ORAL | 3 refills | Status: DC

## 2019-07-08 MED ORDER — ONETOUCH ULTRA VI STRP: ORAL_STRIP | 11 refills | Status: DC

## 2019-07-08 MED ORDER — LOSARTAN POTASSIUM 25 MG PO TABS: 25.0000 mg | ORAL_TABLET | Freq: Every day | ORAL | 3 refills | Status: DC

## 2019-07-08 MED ORDER — CLOPIDOGREL BISULFATE 75 MG PO TABS: 75.0000 mg | ORAL_TABLET | Freq: Every day | ORAL | 3 refills | Status: DC

## 2019-07-08 NOTE — Assessment & Plan Note (Signed)
No anginal events.  Has not had to use nitroglycerin.  Stable.  On Plavix daily.  Continue beta-blocker.  No changes.  Has not seen cardiologist in 2 years.  Advised to see him for follow-up this year.

## 2019-07-08 NOTE — Progress Notes (Signed)
Lab Results  Component Value Date   HGBA1C 7.8 (A) 04/07/2019   BP Readings from Last 3 Encounters:  04/22/19 123/64  04/08/19 (!) 146/76  04/07/19 (!) 142/78   Lab Results  Component Value Date   CHOL 194 04/07/2019   HDL 41 04/07/2019   LDLCALC 121 (H) 04/07/2019   TRIG 158 (H) 04/07/2019   CHOLHDL 4.7 04/07/2019   Wt Readings from Last 3 Encounters:  07/08/19 212 lb 3.2 oz (96.3 kg)  04/22/19 214 lb 9.6 oz (97.3 kg)  04/08/19 214 lb 9.6 oz (97.3 kg)   Lab Results  Component Value Date   CREATININE 0.90 04/07/2019   BUN 22 04/07/2019   NA 139 04/07/2019   K 5.2 04/07/2019   CL 101 04/07/2019   CO2 19 (L) 04/07/2019   Anes Hillmann Jr. 73 y.o.   Chief Complaint  Patient presents with  . Diabetes    follow up 3 month   . Medication Refill    pend    HISTORY OF PRESENT ILLNESS: This is a 73 y.o. male here for follow-up of chronic medical problems, diabetes in particular.  Doing well.  Eating better. 1.  Diabetes: On glipizide 5 mg twice a day.  Did not tolerate Jardiance, too many urinary symptoms.  Blood glucose at home has been between 80 and 120.  Feels better because he is eating better.  No hypoglycemia episodes at home. 2.  Coronary artery disease status post stent placement.  On Plavix and beta-blocker, metoprolol succinate 25 mg daily. 3.  Hypertension: On losartan 25 mg daily. Has no complaints or medical concerns today. Needs medication refills. A1A HPI   Prior to Admission medications   Medication Sig Start Date End Date Taking? Authorizing Provider  blood glucose meter kit and supplies Use to test blood sugar daily. Dx: E11.9 10/11/15  Yes Tereasa Coop, PA-C  clopidogrel (PLAVIX) 75 MG tablet TAKE 1 TABLET BY MOUTH EVERY DAY 06/14/19  Yes Leonie Man, MD  Lancets MISC Use to test blood sugar daily. Dx code :E11.9 10/11/15  Yes Tereasa Coop, PA-C  losartan (COZAAR) 25 MG tablet Take 1 tablet (25 mg total) by mouth daily. 10/01/18  Yes  Rishi Vicario, Ines Bloomer, MD  metoprolol succinate (TOPROL-XL) 25 MG 24 hr tablet Take 1 tablet (25 mg total) by mouth daily. 05/25/19  Yes Lendon Colonel, NP  Multiple Vitamin (MULTIVITAMIN) tablet Take 1 tablet by mouth daily.   Yes [provider]  nitroGLYCERIN (NITROSTAT) 0.4 MG SL tablet Place 1 tablet (0.4 mg total) under the tongue every 5 (five) minutes x 3 doses as needed for chest pain. 01/19/18  Yes Leonie Man, MD  [facility] ULTRA test strip USE TO TEST BLOOD SUGAR ONCE DAILY 03/05/19  Yes Horald Pollen, MD  glipiZIDE (GLUCOTROL) 5 MG tablet Take 1 tablet (5 mg total) by mouth 2 (two) times daily before a meal. 10/01/18 04/22/19  Horald Pollen, MD    Allergies  Allergen Reactions  . Januvia [Sitagliptin] Other (See Comments)    Gi upset and kidney problems  . Lisinopril Cough  . Statins Other (See Comments)    Severe leg pain & fatigue -- tried at least 4 (even low dose)    Patient Active Problem List   Diagnosis Date Noted  . Uncircumcised male 03/11/2018  . Coronary artery disease involving native coronary artery of native heart without angina pectoris 11/04/2017  . Abnormal findings on diagnostic imaging of cardiovascular system 11/04/2017  .  Agatston coronary artery calcium score between 100 and 199 09/22/2017  . Family history of early CAD 09/11/2017  . Mild aortic stenosis by prior echocardiogram 09/11/2017  . Metabolic syndrome 44/96/7591  . 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 BMI 31.0-31.9,adult 03/13/2016  . Essential hypertension 08/29/2015  . Diverticulosis of colon without hemorrhage 08/01/2015  . Statin intolerance 08/01/2015  . Basal cell carcinoma of nose 08/23/2014  . Rosacea 08/23/2014  . Seasonal allergies 01/26/2013  . Type 2 diabetes mellitus with hyperglycemia, without long-term current use of insulin ([place]) 07/09/2012  . Hyperlipidemia associated with type 2 diabetes mellitus ([place]) 07/09/2012  . Erectile dysfunction  07/09/2012    Past Medical History:  Diagnosis Date  . Allergy   . Basal cell carcinoma 01/2013   Nasal and R forearm.  [facility]  . CAD S/P percutaneous coronary angioplasty    2/19 PCI/DESx1 to mLAD, FFR 0.7 -[place]ierra DES 2.75 mm x 18 mm  . Cataract   . Diabetes mellitus without complication (HCC)    on PO Meds  . Essential hypertension   . Hyperlipidemia    statin intolerant -- myalgias, fatigue (tried at least 4)  . Rosacea     Past Surgical History:  Procedure Laterality Date  . basal cell removal     nose and wrist   . BELPHAROPTOSIS REPAIR    . CORONARY CT ANGIOGRAM  09/2017   Coronary Calcium Score: 111. Coronary calcification noted in the LEFT MAIN (LM) and prox LAD.  LM < 50% calcified stenosis.  Mid LAD 50-75% plaque noted CT FFR --> positive at 0.80.  Recommend CARDIAC CATHETERIZATION  . CORONARY STENT INTERVENTION N/A 11/12/2017   Procedure: CORONARY STENT INTERVENTION;  Surgeon: Leonie Man, MD;  Location: [place] [facility];  Service: Cardiovascular:  DES PCI:  STENT SIERRA 2.75 X 18 MM - Post intervention, there is a 0% residual stenosis.  Marland Kitchen ETT/GXT: Graded Exercise Tolerance Test  08/2015    Exercised for 9:01 min --> blunted blood pressure response no EKG changes.  No chest pain.  Low Risk  . EYE SURGERY     Cataract surgery  . eyelid surgery    . INTRAVASCULAR PRESSURE WIRE/FFR STUDY N/A 11/12/2017   Procedure: INTRAVASCULAR PRESSURE WIRE/FFR STUDY;  Surgeon: Leonie Man, MD;  Location: [place] [facility];  Service: Cardiovascular;  Laterality: LAD -FFR 0.76  . LEFT HEART CATH AND CORONARY ANGIOGRAPHY N/A 11/12/2017   Procedure: LEFT HEART CATH AND CORONARY ANGIOGRAPHY;  Surgeon: Leonie Man, MD;  Location: [facility]: 70% P-M LAD (FFR 0.76).  40% mid LAD, 50% ostial ramus.  EF 55-60%.  Mildly elevated LVEDP.  Marland Kitchen SHOULDER OPEN ROTATOR CUFF REPAIR  1983  . TRANSTHORACIC ECHOCARDIOGRAM  08/2017   EF 55-60%.  Mild aortic stenosis  (mean gradient 11 mmH.)  GR 1 DD.  Mild LA dilation.    Social History   Socioeconomic History  . Marital status: Married    Spouse name: [name]  . Number of children: 4  . Years of education: 46  . Highest education level: Not on file  Occupational History  . Occupation: bus Engineer, manufacturing systems: [employer]  . Financial resource strain: Not on file  . Food insecurity    Worry: Not on file    Inability: Not on file  . Transportation needs    Medical: Not on file    Non-medical: Not on file  Tobacco Use  .  Smoking status: Former Smoker    Packs/day: 1.00    Years: 15.00    Pack years: 15.00    Quit date: 07/09/1982    Years since quitting: 37.0  . Smokeless tobacco: Never Used  Substance and Sexual Activity  . Alcohol use: No  . Drug use: No  . Sexual activity: Yes    Partners: Female  Lifestyle  . Physical activity    Days per week: Not on file    Minutes per session: Not on file  . Stress: Not on file  Relationships  . Social Herbalist on phone: Not on file    Gets together: Not on file    Attends religious service: Not on file    Active member of club or organization: Not on file    Attends meetings of clubs or organizations: Not on file    Relationship status: Not on file  . Intimate partner violence    Fear of current or ex partner: Not on file    Emotionally abused: Not on file    Physically abused: Not on file    Forced sexual activity: Not on file  Other Topics Concern  . Not on file  Social History Narrative   Marital status: married x 83 years; happily married.       Children:  4 children; 4 grandchildren. Adult children all live locally.      Lives: with wife      Employment: retired from bus transportation; PRN driving now activity bus. [employer]  Chicken farming.      Tobacco:  In past; smoked x 15 years; quit 35 years ago.      Alcohol: none      Exercise:sporadic in 2017.      ADLs:  independent with all ADLs.  Does not use assistant device with ambulation.      Living Will:  Has living will; desires FULL CODE; no prolonged measures.      Doctoral degree in Biblical Studies.        Family History  Problem Relation Age of Onset  . Leukemia Mother   . Stroke Father 54       as a complication of CABG  . Colon cancer Father 71  . Cancer Father 83       Colon cancer  . Coronary artery disease Father 58       - Sx was DOE - MV CAD  . Peripheral Artery Disease Father        presumptive  . Diabetes Sister   . Sudden Cardiac Death Sister 26       presumed MI  . Heart attack Brother 81       During throat surgery  . Congestive Heart Failure Brother        Died @ 39 - ? CHF / ICM  . Cancer Maternal Grandmother   . Stroke Maternal Grandfather   . Diabetes Brother 64       on lots of meds  . Diabetes Brother   . Cancer Brother        colon cancer  . Rheumatic fever Brother        childhood RF --> Valvular Dz - valve Sgx,  died young     Review of Systems  Constitutional: Negative.   HENT: Negative.   Eyes: Negative.   Respiratory: Negative.  Negative for shortness of breath.   Cardiovascular: Negative.  Negative for palpitations.  Gastrointestinal: Negative for diarrhea.  Genitourinary: Negative.   Musculoskeletal: Negative.   Skin: Negative.   Neurological: Negative for dizziness.  All other systems reviewed and are negative.   Vitals:   07/08/19 0807  BP: (!) 146/74  Pulse: 63  Resp: 16  Temp: 98.1 F (36.7 C)  SpO2: 95%    Physical Exam Vitals signs reviewed.  Constitutional:      Appearance: Normal appearance.  HENT:     Head: Normocephalic.  Eyes:     Extraocular Movements: Extraocular movements intact.     Pupils: Pupils are equal, round, and reactive to light.  Neck:     Musculoskeletal: Normal range of motion and neck supple.  Cardiovascular:     Rate and Rhythm: Normal rate and regular rhythm.     Pulses: Normal pulses.      Heart sounds: Normal heart sounds.  Pulmonary:     Effort: Pulmonary effort is normal.     Breath sounds: Normal breath sounds.  Musculoskeletal: Normal range of motion.  Skin:    General: Skin is warm and dry.  Neurological:     General: No focal deficit present.     Mental Status: He is alert and oriented to person, place, and time.  Psychiatric:        Mood and Affect: Mood normal.        Behavior: Behavior normal.    Results for orders placed or performed in visit on 07/08/19 (from the past 24 hour(s))  POCT glucose (manual entry)     Status: Abnormal   Collection Time: 07/08/19  8:25 AM  Result Value Ref Range   POC Glucose 125 (A) 70 - 99 mg/dl  POCT glycosylated hemoglobin (Hb A1C)     Status: Abnormal   Collection Time: 07/08/19  8:31 AM  Result Value Ref Range   Hemoglobin A1C 7.1 (A) 4.0 - 5.6 %   HbA1c POC (<> result, manual entry)     HbA1c, POC (prediabetic range)     HbA1c, POC (controlled diabetic range)       ASSESSMENT & PLAN: Coronary artery disease involving native coronary artery of native heart without angina pectoris No anginal events.  Has not had to use nitroglycerin.  Stable.  On Plavix daily.  Continue beta-blocker.  No changes.  Has not seen cardiologist in 2 years.  Advised to see him for follow-up this year.  Hypertension associated with diabetes ([place]) Acceptable blood pressure.  Normal numbers at home. Hemoglobin A1c better than before at 7.1.  Continue present medications.  No changes.  Continue present nutrition and physical activity.  Follow-up in 6 months.   Gregory Mathews was seen today for diabetes and medication refill.  Diagnoses and all orders for this visit:  Hypertension associated with diabetes ([place])  Type 2 diabetes mellitus with hyperglycemia, without long-term current use of insulin (HCC) -     POCT glucose (manual entry) -     POCT glycosylated hemoglobin (Hb A1C) -     glipiZIDE (GLUCOTROL) 5 MG tablet; Take 1 tablet (5 mg total)  by mouth 2 (two) times daily before a meal. -     Lancets MISC; Use to test blood sugar daily. Dx code :E11.9 -     glucose blood (ONETOUCH ULTRA) test strip; USE TO TEST BLOOD SUGAR ONCE DAILY  Essential hypertension -     losartan (COZAAR) 25 MG tablet; Take 1 tablet (25 mg total) by mouth daily.  Coronary artery disease involving native coronary artery of native heart without angina pectoris -  metoprolol succinate (TOPROL-XL) 25 MG 24 hr tablet; Take 1 tablet (25 mg total) by mouth daily.  Status post coronary artery stent placement -     clopidogrel (PLAVIX) 75 MG tablet; Take 1 tablet (75 mg total) by mouth daily.  Need for prophylactic vaccination and inoculation against influenza -     Flu Vaccine QUAD High Dose(Fluad)    Patient Instructions       If you have lab work done today you will be contacted with your lab results within the next 2 weeks.  If you have not heard from [place] then please contact us. The fastest way to get your results is to register for My Chart.   IF you received an x-ray today, you will receive an invoice from [facility]. Please contact [facility] at [phone] with questions or concerns regarding your invoice.   IF you received labwork today, you will receive an invoice from [facility]. Please contact [facility] at [phone] with questions or concerns regarding your invoice.   Our billing staff will not be able to assist you with questions regarding bills from these companies.  You will be contacted with the lab results as soon as they are available. The fastest way to get your results is to activate your My Chart account. Instructions are located on the last page of this paperwork. If you have not heard from [place] regarding the results in 2 weeks, please contact this office.     Diabetes Mellitus and Nutrition, Adult When you have diabetes (diabetes mellitus), it is very important to have healthy eating habits because your  blood sugar (glucose) levels are greatly affected by what you eat and drink. Eating healthy foods in the appropriate amounts, at about the same times every day, can help you:  Control your blood glucose.  Lower your risk of heart disease.  Improve your blood pressure.  Reach or maintain a healthy weight. Every person with diabetes is different, and each person has different needs for a meal plan. Your health care provider may recommend that you work with a diet and nutrition specialist (dietitian) to make a meal plan that is best for you. Your meal plan may vary depending on factors such as:  The calories you need.  The medicines you take.  Your weight.  Your blood glucose, blood pressure, and cholesterol levels.  Your activity level.  Other health conditions you have, such as heart or kidney disease. How do carbohydrates affect me? Carbohydrates, also called carbs, affect your blood glucose level more than any other type of food. Eating carbs naturally raises the amount of glucose in your blood. Carb counting is a method for keeping track of how many carbs you eat. Counting carbs is important to keep your blood glucose at a healthy level, especially if you use insulin or take certain oral diabetes medicines. It is important to know how many carbs you can safely have in each meal. This is different for every person. Your dietitian can help you calculate how many carbs you should have at each meal and for each snack. Foods that contain carbs include:  Bread, cereal, rice, pasta, and crackers.  Potatoes and corn.  Peas, beans, and lentils.  Milk and yogurt.  Fruit and juice.  Desserts, such as cakes, cookies, ice cream, and candy. How does alcohol affect me? Alcohol can cause a sudden decrease in blood glucose (hypoglycemia), especially if you use insulin or take certain oral diabetes medicines. Hypoglycemia can be a life-threatening condition.  Symptoms of hypoglycemia  (sleepiness, dizziness, and confusion) are similar to symptoms of having too much alcohol. If your health care provider says that alcohol is safe for you, follow these guidelines:  Limit alcohol intake to no more than 1 drink per day for nonpregnant women and 2 drinks per day for men. One drink equals 12 oz of beer, 5 oz of wine, or 1 oz of hard liquor.  Do not drink on an empty stomach.  Keep yourself hydrated with water, diet soda, or unsweetened iced tea.  Keep in mind that regular soda, juice, and other mixers may contain a lot of sugar and must be counted as carbs. What are tips for following this plan?  Reading food labels  Start by checking the serving size on the "Nutrition Facts" label of packaged foods and drinks. The amount of calories, carbs, fats, and other nutrients listed on the label is based on one serving of the item. Many items contain more than one serving per package.  Check the total grams (g) of carbs in one serving. You can calculate the number of servings of carbs in one serving by dividing the total carbs by 15. For example, if a food has 30 g of total carbs, it would be equal to 2 servings of carbs.  Check the number of grams (g) of saturated and trans fats in one serving. Choose foods that have low or no amount of these fats.  Check the number of milligrams (mg) of salt (sodium) in one serving. Most people should limit total sodium intake to less than 2,300 mg per day.  Always check the nutrition information of foods labeled as "low-fat" or "nonfat". These foods may be higher in added sugar or refined carbs and should be avoided.  Talk to your dietitian to identify your daily goals for nutrients listed on the label. Shopping  Avoid buying canned, premade, or processed foods. These foods tend to be high in fat, sodium, and added sugar.  Shop around the outside edge of the grocery store. This includes fresh fruits and vegetables, bulk grains, fresh meats, and  fresh dairy. Cooking  Use low-heat cooking methods, such as baking, instead of high-heat cooking methods like deep frying.  Cook using healthy oils, such as olive, canola, or sunflower oil.  Avoid cooking with butter, cream, or high-fat meats. Meal planning  Eat meals and snacks regularly, preferably at the same times every day. Avoid going long periods of time without eating.  Eat foods high in fiber, such as fresh fruits, vegetables, beans, and whole grains. Talk to your dietitian about how many servings of carbs you can eat at each meal.  Eat 4-6 ounces (oz) of lean protein each day, such as lean meat, chicken, fish, eggs, or tofu. One oz of lean protein is equal to: ? 1 oz of meat, chicken, or fish. ? 1 egg. ?  cup of tofu.  Eat some foods each day that contain healthy fats, such as avocado, nuts, seeds, and fish. Lifestyle  Check your blood glucose regularly.  Exercise regularly as told by your health care provider. This may include: ? 150 minutes of moderate-intensity or vigorous-intensity exercise each week. This could be brisk walking, biking, or water aerobics. ? Stretching and doing strength exercises, such as yoga or weightlifting, at least 2 times a week.  Take medicines as told by your health care provider.  Do not use any products that contain nicotine or tobacco, such as cigarettes and e-cigarettes. If you  need help quitting, ask your health care provider.  Work with a Social worker or diabetes educator to identify strategies to manage stress and any emotional and social challenges. Questions to ask a health care provider  Do I need to meet with a diabetes educator?  Do I need to meet with a dietitian?  What number can I call if I have questions?  When are the best times to check my blood glucose? Where to find more information:  American Diabetes Association: diabetes.org  Academy of Nutrition and Dietetics: www.eatright.[employer] of Diabetes  and Digestive and Kidney Diseases (NIH): [employer] Summary  A healthy meal plan will help you control your blood glucose and maintain a healthy lifestyle.  Working with a diet and nutrition specialist (dietitian) can help you make a meal plan that is best for you.  Keep in mind that carbohydrates (carbs) and alcohol have immediate effects on your blood glucose levels. It is important to count carbs and to use alcohol carefully. This information is not intended to replace advice given to you by your health care provider. Make sure you discuss any questions you have with your health care provider. Document Released: 06/06/2005 Document Revised: 08/22/2017 Document Reviewed: 10/14/2016 Elsevier Patient Education  2020 Elsevier Inc.      Agustina Caroli, MD [facility]

## 2019-07-08 NOTE — Assessment & Plan Note (Signed)
Acceptable blood pressure.  Normal numbers at home. Hemoglobin A1c better than before at 7.1.  Continue present medications.  No changes.  Continue present nutrition and physical activity.  Follow-up in 6 months.

## 2019-07-08 NOTE — Patient Instructions (Addendum)
   If you have lab work done today you will be contacted with your lab results within the next 2 weeks.  If you have not heard from us then please contact us. The fastest way to get your results is to register for My Chart.   IF you received an x-ray today, you will receive an invoice from [facility]. Please contact [facility] at [phone] with questions or concerns regarding your invoice.   IF you received labwork today, you will receive an invoice from [facility]. Please contact [facility] at [phone] with questions or concerns regarding your invoice.   Our billing staff will not be able to assist you with questions regarding bills from these companies.  You will be contacted with the lab results as soon as they are available. The fastest way to get your results is to activate your My Chart account. Instructions are located on the last page of this paperwork. If you have not heard from us regarding the results in 2 weeks, please contact this office.     Diabetes Mellitus and Nutrition, Adult When you have diabetes (diabetes mellitus), it is very important to have healthy eating habits because your blood sugar (glucose) levels are greatly affected by what you eat and drink. Eating healthy foods in the appropriate amounts, at about the same times every day, can help you:  Control your blood glucose.  Lower your risk of heart disease.  Improve your blood pressure.  Reach or maintain a healthy weight. Every person with diabetes is different, and each person has different needs for a meal plan. Your health care provider may recommend that you work with a diet and nutrition specialist (dietitian) to make a meal plan that is best for you. Your meal plan may vary depending on factors such as:  The calories you need.  The medicines you take.  Your weight.  Your blood glucose, blood pressure, and cholesterol levels.  Your activity level.  Other health conditions  you have, such as heart or kidney disease. How do carbohydrates affect me? Carbohydrates, also called carbs, affect your blood glucose level more than any other type of food. Eating carbs naturally raises the amount of glucose in your blood. Carb counting is a method for keeping track of how many carbs you eat. Counting carbs is important to keep your blood glucose at a healthy level, especially if you use insulin or take certain oral diabetes medicines. It is important to know how many carbs you can safely have in each meal. This is different for every person. Your dietitian can help you calculate how many carbs you should have at each meal and for each snack. Foods that contain carbs include:  Bread, cereal, rice, pasta, and crackers.  Potatoes and corn.  Peas, beans, and lentils.  Milk and yogurt.  Fruit and juice.  Desserts, such as cakes, cookies, ice cream, and candy. How does alcohol affect me? Alcohol can cause a sudden decrease in blood glucose (hypoglycemia), especially if you use insulin or take certain oral diabetes medicines. Hypoglycemia can be a life-threatening condition. Symptoms of hypoglycemia (sleepiness, dizziness, and confusion) are similar to symptoms of having too much alcohol. If your health care provider says that alcohol is safe for you, follow these guidelines:  Limit alcohol intake to no more than 1 drink per day for nonpregnant women and 2 drinks per day for men. One drink equals 12 oz of beer, 5 oz of wine, or 1 oz of hard liquor.    Do not drink on an empty stomach.  Keep yourself hydrated with water, diet soda, or unsweetened iced tea.  Keep in mind that regular soda, juice, and other mixers may contain a lot of sugar and must be counted as carbs. What are tips for following this plan?  Reading food labels  Start by checking the serving size on the "Nutrition Facts" label of packaged foods and drinks. The amount of calories, carbs, fats, and other  nutrients listed on the label is based on one serving of the item. Many items contain more than one serving per package.  Check the total grams (g) of carbs in one serving. You can calculate the number of servings of carbs in one serving by dividing the total carbs by 15. For example, if a food has 30 g of total carbs, it would be equal to 2 servings of carbs.  Check the number of grams (g) of saturated and trans fats in one serving. Choose foods that have low or no amount of these fats.  Check the number of milligrams (mg) of salt (sodium) in one serving. Most people should limit total sodium intake to less than 2,300 mg per day.  Always check the nutrition information of foods labeled as "low-fat" or "nonfat". These foods may be higher in added sugar or refined carbs and should be avoided.  Talk to your dietitian to identify your daily goals for nutrients listed on the label. Shopping  Avoid buying canned, premade, or processed foods. These foods tend to be high in fat, sodium, and added sugar.  Shop around the outside edge of the grocery store. This includes fresh fruits and vegetables, bulk grains, fresh meats, and fresh dairy. Cooking  Use low-heat cooking methods, such as baking, instead of high-heat cooking methods like deep frying.  Cook using healthy oils, such as olive, canola, or sunflower oil.  Avoid cooking with butter, cream, or high-fat meats. Meal planning  Eat meals and snacks regularly, preferably at the same times every day. Avoid going long periods of time without eating.  Eat foods high in fiber, such as fresh fruits, vegetables, beans, and whole grains. Talk to your dietitian about how many servings of carbs you can eat at each meal.  Eat 4-6 ounces (oz) of lean protein each day, such as lean meat, chicken, fish, eggs, or tofu. One oz of lean protein is equal to: ? 1 oz of meat, chicken, or fish. ? 1 egg. ?  cup of tofu.  Eat some foods each day that contain  healthy fats, such as avocado, nuts, seeds, and fish. Lifestyle  Check your blood glucose regularly.  Exercise regularly as told by your health care provider. This may include: ? 150 minutes of moderate-intensity or vigorous-intensity exercise each week. This could be brisk walking, biking, or water aerobics. ? Stretching and doing strength exercises, such as yoga or weightlifting, at least 2 times a week.  Take medicines as told by your health care provider.  Do not use any products that contain nicotine or tobacco, such as cigarettes and e-cigarettes. If you need help quitting, ask your health care provider.  Work with a counselor or diabetes educator to identify strategies to manage stress and any emotional and social challenges. Questions to ask a health care provider  Do I need to meet with a diabetes educator?  Do I need to meet with a dietitian?  What number can I call if I have questions?  When are the best times to   check my blood glucose? Where to find more information:  American Diabetes Association: diabetes.org  Academy of Nutrition and Dietetics: www.eatright.org  National Institute of Diabetes and Digestive and Kidney Diseases (NIH): www.niddk.nih.gov Summary  A healthy meal plan will help you control your blood glucose and maintain a healthy lifestyle.  Working with a diet and nutrition specialist (dietitian) can help you make a meal plan that is best for you.  Keep in mind that carbohydrates (carbs) and alcohol have immediate effects on your blood glucose levels. It is important to count carbs and to use alcohol carefully. This information is not intended to replace advice given to you by your health care provider. Make sure you discuss any questions you have with your health care provider. Document Released: 06/06/2005 Document Revised: 08/22/2017 Document Reviewed: 10/14/2016 [employer] Patient Education  2020 [employer]  

## 2019-08-09 ENCOUNTER — Other Ambulatory Visit: Payer: Self-pay

## 2019-08-09 ENCOUNTER — Encounter: Payer: Medicare Other | Admitting: Emergency Medicine

## 2019-08-09 ENCOUNTER — Ambulatory Visit (INDEPENDENT_AMBULATORY_CARE_PROVIDER_SITE_OTHER): Payer: Medicare Other | Admitting: Cardiology

## 2019-08-09 VITALS — BP 149/73 | HR 65 | Temp 97.0°F | Ht 69.0 in | Wt 214.0 lb

## 2019-08-09 DIAGNOSIS — I2510 Atherosclerotic heart disease of native coronary artery without angina pectoris: Secondary | ICD-10-CM | POA: Diagnosis not present

## 2019-08-09 DIAGNOSIS — E785 Hyperlipidemia, unspecified: Secondary | ICD-10-CM

## 2019-08-09 DIAGNOSIS — E1169 Type 2 diabetes mellitus with other specified complication: Secondary | ICD-10-CM

## 2019-08-09 DIAGNOSIS — R931 Abnormal findings on diagnostic imaging of heart and coronary circulation: Secondary | ICD-10-CM

## 2019-08-09 DIAGNOSIS — E8881 Metabolic syndrome: Secondary | ICD-10-CM | POA: Diagnosis not present

## 2019-08-09 DIAGNOSIS — I350 Nonrheumatic aortic (valve) stenosis: Secondary | ICD-10-CM

## 2019-08-09 DIAGNOSIS — E1159 Type 2 diabetes mellitus with other circulatory complications: Secondary | ICD-10-CM

## 2019-08-09 DIAGNOSIS — Z789 Other specified health status: Secondary | ICD-10-CM

## 2019-08-09 DIAGNOSIS — I152 Hypertension secondary to endocrine disorders: Secondary | ICD-10-CM

## 2019-08-09 DIAGNOSIS — E1165 Type 2 diabetes mellitus with hyperglycemia: Secondary | ICD-10-CM

## 2019-08-09 DIAGNOSIS — I10 Essential (primary) hypertension: Secondary | ICD-10-CM

## 2019-08-09 NOTE — Progress Notes (Signed)
Primary Care Provider: Horald Pollen, MD Cardiologist: No primary care provider on file. Electrophysiologist:   Clinic Note: Chief Complaint  Patient presents with  . Follow-up    Annual  . Coronary Artery Disease    No angina  . Hyperlipidemia    Never got started on the injection medicine    HPI:    Gregory Mathews. is a 73 y.o. male with a PMH notable for CAD-PCI LAD (for abnormal Cardiac CTA) below who presents today for delayed follow-up secondary to COVID-19.  Mr. Azzara is a very pleasant gentleman w/ a strong Fam Hx of CAD -> Father (MI-CABG, CVA), Sister & Brother (CAD-PCI in their 33s) all with CAD. He is very active @ baseline - enjoys exercisie - usually ~20-30 min 6-7 d / wk.  He was initially seen in consultation for atypical chest discomfort.  Due to a strong family history, we checked an ECHOCARDIOGRAM as well as a CORONARY CT ANGIOGRAM which revealed mild aortic stenosis and significant CT FFR positive proximal LAD lesion that was confirmed by cardiac catheterization (extensive 50% followed by 75% plaque) -treated with DES PCI in February 2019  Gregory Mathews. was last seen by me back in Feb 2019 for initial post Cath-PCI visit.  He was doing quite well on.  No angina.  He went to cardiac rehab without any issues.  Was back to full exercise.  No chest pain or dyspnea.  Noted intolerance of Crestor.  Had lots of muscle aches and cramping.  Was referred to [facility] for possible PCSK9 inhibitor.  Also started Pravachol 20 mg --> I had the PA for Repatha completed, however he was reluctant to start Repatha or Praluent. ->  They initiated Vascepa, but he did not continue that as well.   Currently not on any medications for lipids.  Plan follow-up echo in 2 years  Recent Hospitalizations: none  Reviewed  CV studies:    The following studies were reviewed today: (if available, images/films reviewed: From Epic Chart or Care Everywhere) . n/a   Interval  History:   Gregory Mathews. returns today for annual follow-up stating that he is doing quite well a cardiac standpoint.  Was a little upset at the beginning of the COVID-19 lockdown when he was not able to go back to the [facility] for exercise.  He therefore has cut back down on his exercise a little bit, but will still try to go out to do walking.  Now that the restrictions have limited some, he has been going to the [facility] but not as much.  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  He tries to get walk around little bit during that, but not exercising as much as he usually does.  He is still very active and denies any sense of chest tightness or pressure with rest or exertion.  No dyspnea with rest or exertion.  No heart failure symptoms.   CV Review of Symptoms (Summary)  no chest pain or dyspnea on exertion negative for - edema, irregular heartbeat, orthopnea, palpitations, paroxysmal nocturnal dyspnea, rapid heart rate, shortness of breath or syncope/near syncope, TIA/amaurosis fugax, claudication  The patient does not have symptoms concerning for COVID-19 infection (fever, chills, cough, or new shortness of breath).  The patient is practicing social distancing. ++ Masking.  + goes out for Groceries/shopping. 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    REVIEWED OF SYSTEMS   A comprehensive ROS was performed. Review of Systems  Constitutional: Negative for malaise/fatigue.  HENT: Negative for congestion and nosebleeds.   Respiratory: Negative for cough, shortness of breath and wheezing.   Gastrointestinal: Negative for blood in stool and melena.  Genitourinary: Negative for hematuria.  Musculoskeletal: Negative for joint pain.  Neurological: Negative for dizziness, focal weakness and headaches.  Psychiatric/Behavioral: Negative.   All other systems reviewed  and are negative.  I have reviewed and (if needed) personally updated the patient's problem list, medications, allergies, past medical and surgical history, social and family history.   PAST MEDICAL HISTORY   Past Medical History:  Diagnosis Date  . Allergy   . Basal cell carcinoma 01/2013   Nasal and R forearm.  [facility]  . CAD S/P percutaneous coronary angioplasty    2/19 PCI/DESx1 to mLAD, FFR 0.7 -Sierra DES 2.75 mm x 18 mm  . Cataract   . Diabetes mellitus without complication (HCC)    on PO Meds  . Essential hypertension   . Hyperlipidemia    statin intolerant -- myalgias, fatigue (tried at least 4)  . Rosacea     PAST SURGICAL HISTORY   Past Surgical History:  Procedure Laterality Date  . basal cell removal     nose and wrist   . BELPHAROPTOSIS REPAIR    . CORONARY CT ANGIOGRAM  09/2017   Coronary Calcium Score: 111. Coronary calcification noted in the LEFT MAIN (LM) and prox LAD.  LM < 50% calcified stenosis.  Mid LAD 50-75% plaque noted CT FFR --> positive at 0.80.  Recommend CARDIAC CATHETERIZATION  . CORONARY STENT INTERVENTION N/A 11/12/2017   Procedure: CORONARY STENT INTERVENTION;  Surgeon: Leonie Man, MD;  Location: [facility];  Service: Cardiovascular:  DES PCI:  STENT SIERRA 2.75 X 18 MM - Post intervention, there is a 0% residual stenosis.  Marland Kitchen ETT/GXT: Graded Exercise Tolerance Test  08/2015    Exercised for 9:01 min --> blunted blood pressure response no EKG changes.  No chest pain.  Low Risk  . EYE SURGERY     Cataract surgery  . eyelid surgery    . INTRAVASCULAR PRESSURE WIRE/FFR STUDY N/A 11/12/2017   Procedure: INTRAVASCULAR PRESSURE WIRE/FFR STUDY;  Surgeon: Leonie Man, MD;  Location: [place] [facility];  Service: Cardiovascular;  Laterality: LAD -FFR 0.76  . LEFT HEART CATH AND CORONARY ANGIOGRAPHY N/A 11/12/2017   Procedure: LEFT HEART CATH AND CORONARY ANGIOGRAPHY;  Surgeon: Leonie Man, MD;  [facility]: [facility]  CV LAB: 70% P-M LAD (FFR 0.76).  40% mid LAD, 50% ostial ramus.  EF 55-60%.  Mildly elevated LVEDP.  Marland Kitchen SHOULDER OPEN ROTATOR CUFF REPAIR  1983  . TRANSTHORACIC ECHOCARDIOGRAM  08/2017   EF 55-60%.  Mild aortic stenosis (mean gradient 11 mmH.)  GR 1 DD.  Mild LA dilation.    MEDICATIONS/ALLERGIES   Current Meds  Medication Sig  . blood glucose meter kit and supplies Use to test blood sugar daily. Dx: E11.9  . clopidogrel (PLAVIX) 75 MG tablet Take 1 tablet (75 mg total) by mouth daily.  Marland Kitchen glipiZIDE (GLUCOTROL) 5 MG tablet Take 1 tablet (5 mg total) by mouth 2 (two) times daily before a meal.  . glucose blood (ONETOUCH ULTRA) test strip USE TO TEST BLOOD SUGAR ONCE DAILY  . Lancets MISC Use to test blood sugar daily. Dx code :E11.9  . losartan (COZAAR)  25 MG tablet Take 1 tablet (25 mg total) by mouth daily.  . metoprolol succinate (TOPROL-XL) 25 MG 24 hr tablet Take 1 tablet (25 mg total) by mouth daily.  . Multiple Vitamin (MULTIVITAMIN) tablet Take 1 tablet by mouth daily.  . nitroGLYCERIN (NITROSTAT) 0.4 MG SL tablet Place 1 tablet (0.4 mg total) under the tongue every 5 (five) minutes x 3 doses as needed for chest pain.    Allergies  Allergen Reactions  . Januvia [Sitagliptin] Other (See Comments)    Gi upset and kidney problems  . Lisinopril Cough  . Statins Other (See Comments)    Severe leg pain & fatigue -- tried at least 4 (even low dose)    SOCIAL HISTORY/FAMILY HISTORY   Social History   Tobacco Use  . Smoking status: Former Smoker    Packs/day: 1.00    Years: 15.00    Pack years: 15.00    Quit date: 07/09/1982    Years since quitting: 37.1  . Smokeless tobacco: Never Used  Substance Use Topics  . Alcohol use: No  . Drug use: No   Social History   Social History Narrative   Marital status: married x 61 years; happily married.   Lives: with wife       Children:  4 children; 4 grandchildren. Adult children all live locally.          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      Tobacco:  In past; smoked x 15 years; quit 35 years ago.      Alcohol: none      ADLs: independent with all ADLs.  Does not use assistant device with ambulation.      Living Will:  Has living will; desires FULL CODE; no prolonged measures.      Doctoral degree in Biblical Studies.          Diet: eats twice daily, eggs,sausage,and fruits for breakfast; chicken (fried or broil), occassional pizza, salads, steak and pork chops.       Exercise: cardiac rehab, transition to [facility] - 45 minutes walks and resistance 5x/week    Family History family history includes Cancer in his brother and maternal grandmother; Cancer (age of onset: 31) in his father; Colon cancer (age of onset: 69) in his father; Congestive Heart Failure in his brother; Coronary artery disease (age of onset: 78) in his father; Diabetes in his brother and sister; Diabetes (age of onset: 56) in his brother; Heart attack (age of onset: 57) in his brother; Leukemia in his mother; Peripheral Artery Disease in his father; Rheumatic fever in his brother; Stroke in his maternal grandfather; Stroke (age of onset: 42) in his father; Sudden Cardiac Death (age of onset: 62) in his sister.   OBJCTIVE -PE, EKG, labs   Wt Readings from Last 3 Encounters:  08/09/19 214 lb (97.1 kg)  07/08/19 212 lb 3.2 oz (96.3 kg)  04/22/19 214 lb 9.6 oz (97.3 kg)    Physical Exam: BP (!) 149/73   Pulse 65   Temp (!) 97 F (36.1 C)   Ht _0  (1.753 m)   Wt 214 lb (97.1 kg)   SpO2 99%   BMI 31.60 kg/m  Physical Exam  Constitutional: He is oriented to person, place, and time. He appears well-developed and well-nourished. No distress.  Healthy-appearing.  Well-groomed  HENT:  Head: Normocephalic and atraumatic.  Neck: Normal range of motion. Neck supple. No hepatojugular  reflux and no JVD present. Carotid bruit is not present (Soft radiated aortic murmur).   Cardiovascular: Normal rate, regular rhythm and intact distal pulses. Exam reveals no gallop and no friction rub.  Murmur heard. High-pitched harsh crescendo-decrescendo early systolic murmur is present with a grade of 1/6 at the upper right sternal border radiating to the neck. Pulmonary/Chest: Effort normal and breath sounds normal. No respiratory distress. He has no wheezes. He has no rales.  Abdominal: Soft. Bowel sounds are normal. He exhibits no distension. There is no abdominal tenderness. There is no rebound.  No HSM; mildly obese  Musculoskeletal: Normal range of motion.        General: No edema.  Neurological: He is alert and oriented to person, place, and time.  Psychiatric: He has a normal mood and affect. His behavior is normal. Judgment and thought content normal.  Vitals reviewed.   Adult ECG Report  Rate: 65 ;  Rhythm: normal sinus rhythm and artifact (called PVC by computer); normal axis, intervals & durations  Narrative Interpretation: normal EKG  Recent Labs: As above January 2020: TC 180, TG 130, HDL 43, LDL 111. Lab Results  Component Value Date   CHOL 194 04/07/2019   HDL 41 04/07/2019   LDLCALC 121 (H) 04/07/2019   TRIG 158 (H) 04/07/2019   CHOLHDL 4.7 04/07/2019   Lab Results  Component Value Date   CREATININE 0.90 04/07/2019   BUN 22 04/07/2019   NA 139 04/07/2019   K 5.2 04/07/2019   CL 101 04/07/2019   CO2 19 (L) 04/07/2019    ASSESSMENT/PLAN    Problem List Items Addressed This Visit    Coronary artery disease involving native coronary artery of native heart without angina pectoris - Primary (Chronic)    No further angina after LAD PCI.  Was borderline symptomatic at that time, but based on multiple medication intolerances, we chose to proceed with PCI.  Other lesions were not hemodynamically significant. Marland Kitchen He is on stable dose of beta-blocker and ARB although we may potentially need to titrate up ARB if pressures are elevated. Statin intolerant  with plans to start PCSK9 inhibitor.  Continue clopidogrel for LAD stent and secondary prevention of other lesions.  He is well beyond 12 months post PCI. --Okay to hold Plavix 5 to 7 days for procedures.       Relevant Orders   EKG 12-Lead (Completed)   ECHOCARDIOGRAM COMPLETE   Hyperlipidemia associated with type 2 diabetes mellitus (HCC) (Chronic)    Has been intolerant of atorvastatin, simvastatin and rosuvastatin.  Did not take pravastatin.  Did not tolerate with Vascepa.  Plan: Initiate PCSK9 inhibitor.  I is much told him that with his significant risk, if he does not want further complications or potential MI, he needs to take this seriously.  Target LDL should be 55.      Mild aortic stenosis by prior echocardiogram (Chronic)    Noted on echo in early 2019.    Plan follow-up echo early 2021.  If stable at that time would probably do every 2 years.      Relevant Orders   ECHOCARDIOGRAM COMPLETE   Type 2 diabetes mellitus with hyperglycemia, without long-term current use of insulin (HCC) (Chronic)    Strongly consider (especially with intolerance of more Metformin) GLP-1 agonist versus SGLT2 inhibitor (it appears that his primary doctor had intended to start SGLT2 inhibitor last visit, but I do not see it listed)      Hypertension associated with diabetes (  [place]) (Chronic)    Blood pressure is little high today.  Usually for him is in the 120s over 70s at home.  Borderline but acceptable for now.  Low threshold to consider titrating up his ARB dose given diabetes.      Metabolic syndrome (Chronic)    Clearly still at high risk from cardiac standpoint with diabetes, dyslipidemia and hypertension as well as obesity.  Need to ensure aggressive diabetes as well as blood pressure lipid and weight control.  Discussed importance of weight loss.  For additional diabetes control, recommend consideration SGLT 2 inhibitor (Farxiga or Jardiance) versus GLP-1 agonist (Ozempic or Byetta)       Statin intolerance    Not sure how much of this is reluctant to try medications or truly having symptoms.  However lipids are not at goal with LDL of 121.  Did not tolerate the trial of Vascepa which also gives cardiovascular protection. I it is much convinced him that he needs to take this seriously and that we do have options that would not have the side effects and so he needs to try he is a PCSK9 inhibitors.  . We will have our [facility] pharmacist contacted him again to get the prior authorization completed for starting PCSK9 inhibitor.      Agatston coronary artery calcium score between 100 and 199    Pretty significant coronary calcium score over 500 with a notable lesion found in the LAD but otherwise the diffuse coronary calcification.  Need to continue aggressive risk factor modification as noted elsewhere.  Essentially metabolic syndrome management         COVID-19 Education: The signs and symptoms of COVID-19 were discussed with the patient and how to seek care for testing (follow up with PCP or arrange E-visit).   The importance of social distancing was discussed today.  I spent a total of 22mnutes with the patient and chart review. >  50% of the time was spent in direct patient consultation.  Additional time spent with chart review (studies, outside notes, etc): 6 Total Time: 30 min   Current medicines are reviewed at length with the patient today.  (+/- concerns) never started Vascepa.  Did not get back in touch with [facility] pharmacy team to start PCSK9 inhibitor.   Patient Instructions / Medication Changes & Studies & Tests Ordered   Patient Instructions  Medication Instructions:  Not needed *If you need a refill on your cardiac medications before your next appointment, please call your pharmacy*  Lab Work: Not needed  Testing/Procedures: WILL BE SCHEDULE AT [address] Your physician has requested that you have an echocardiogram.  Echocardiography is a painless test that uses sound waves to create images of your heart. It provides your doctor with information about the size and shape of your heart and how well your heart's chambers and valves are working. This procedure takes approximately one hour. There are no restrictions for this procedure.    Follow-Up: [facility] and your health needs are our priority.  As part of our continuing mission to provide you with exceptional heart care, we have created designated Provider Care Teams.  These Care Teams include your primary Cardiologist (physician) and Advanced Practice Providers (APPs -  Physician Assistants and Nurse Practitioners) who all work together to provide you with the care you need, when you need it.  Your next appointment:   12 months  The format for your next appointment:   In Person  Provider:   Glenetta Hew, MD  Other Instructions  [facility] PHARMACIST WILL CONTACT YOU IN REGARDS TO STARTING A CHOLESTEROL - INJECTABLE MEDICATIONS   Recommend follow-up lipids in roughly 4 to 6 months per PCP.   Studies Ordered:   Orders Placed This Encounter  Procedures  . EKG 12-Lead  . ECHOCARDIOGRAM COMPLETE     Glenetta Hew, M.D., M.S. Interventional Cardiologist   Pager # [phone] Phone # [phone] [address] [place], [place] [address]   Thank you for choosing [facility] at [facility]!!

## 2019-08-09 NOTE — Patient Instructions (Addendum)
Medication Instructions:  Not needed *If you need a refill on your cardiac medications before your next appointment, please call your pharmacy*  Lab Work: Not needed  Testing/Procedures: WILL BE SCHEDULE AT [place] [address] Your physician has requested that you have an echocardiogram. Echocardiography is a painless test that uses sound waves to create images of your heart. It provides your doctor with information about the size and shape of your heart and how well your heart's chambers and valves are working. This procedure takes approximately one hour. There are no restrictions for this procedure.    Follow-Up: At [facility], you and your health needs are our priority.  As part of our continuing mission to provide you with exceptional heart care, we have created designated Provider Care Teams.  These Care Teams include your primary Cardiologist (physician) and Advanced Practice Providers (APPs -  Physician Assistants and Nurse Practitioners) who all work together to provide you with the care you need, when you need it.  Your next appointment:   12 months  The format for your next appointment:   In Person  Provider:   Glenetta Hew, MD  Other Instructions  [place]

## 2019-08-11 ENCOUNTER — Encounter: Payer: Self-pay | Admitting: Cardiology

## 2019-08-11 ENCOUNTER — Telehealth: Payer: Self-pay

## 2019-08-11 MED ORDER — PRALUENT 150 MG/ML ~~LOC~~ SOAJ: 150.0000 mg | SUBCUTANEOUS | 11 refills | Status: DC

## 2019-08-11 NOTE — Assessment & Plan Note (Signed)
Pretty significant coronary calcium score over 500 with a notable lesion found in the LAD but otherwise the diffuse coronary calcification.  Need to continue aggressive risk factor modification as noted elsewhere.  Essentially metabolic syndrome management

## 2019-08-11 NOTE — Assessment & Plan Note (Addendum)
Clearly still at high risk from cardiac standpoint with diabetes, dyslipidemia and hypertension as well as obesity.  Need to ensure aggressive diabetes as well as blood pressure lipid and weight control.  Discussed importance of weight loss.  For additional diabetes control, recommend consideration SGLT 2 inhibitor (Farxiga or Jardiance) versus GLP-1 agonist (Ozempic or Byetta)

## 2019-08-11 NOTE — Assessment & Plan Note (Signed)
Not sure how much of this is reluctant to try medications or truly having symptoms.  However lipids are not at goal with LDL of 121.  Did not tolerate the trial of Vascepa which also gives cardiovascular protection. I it is much convinced him that he needs to take this seriously and that we do have options that would not have the side effects and so he needs to try he is a PCSK9 inhibitors.  . We will have our [facility] pharmacist contacted him again to get the prior authorization completed for starting PCSK9 inhibitor.

## 2019-08-11 NOTE — Assessment & Plan Note (Addendum)
No further angina after LAD PCI.  Was borderline symptomatic at that time, but based on multiple medication intolerances, we chose to proceed with PCI.  Other lesions were not hemodynamically significant. Marland Kitchen He is on stable dose of beta-blocker and ARB although we may potentially need to titrate up ARB if pressures are elevated. Statin intolerant with plans to start PCSK9 inhibitor.  Continue clopidogrel for LAD stent and secondary prevention of other lesions.  He is well beyond 12 months post PCI. --Okay to hold Plavix 5 to 7 days for procedures.

## 2019-08-11 NOTE — Assessment & Plan Note (Signed)
Blood pressure is little high today.  Usually for him is in the 120s over 70s at home.  Borderline but acceptable for now.  Low threshold to consider titrating up his ARB dose given diabetes.

## 2019-08-11 NOTE — Assessment & Plan Note (Signed)
Has been intolerant of atorvastatin, simvastatin and rosuvastatin.  Did not take pravastatin.  Did not tolerate with Vascepa.  Plan: Initiate PCSK9 inhibitor.  I is much told him that with his significant risk, if he does not want further complications or potential MI, he needs to take this seriously.  Target LDL should be 55.

## 2019-08-11 NOTE — Assessment & Plan Note (Signed)
Noted on echo in early 2019.    Plan follow-up echo early 2021.  If stable at that time would probably do every 2 years.

## 2019-08-11 NOTE — Assessment & Plan Note (Addendum)
Strongly consider (especially with intolerance of more Metformin) GLP-1 agonist versus SGLT2 inhibitor (it appears that his primary doctor had intended to start SGLT2 inhibitor last visit, but I do not see it listed)

## 2019-08-11 NOTE — Telephone Encounter (Signed)
Called and spoke with pt regarding the approval of the praluent. Instructed the pt to inject the pen subcutaneously every 14 days. The pt voiced understanding.[id number]. Instructed the pt to call back with any questions. Pt voiced understanding.

## 2019-08-16 ENCOUNTER — Other Ambulatory Visit (HOSPITAL_COMMUNITY): Payer: Medicare Other

## 2019-09-21 ENCOUNTER — Other Ambulatory Visit: Payer: Self-pay

## 2019-09-21 ENCOUNTER — Encounter: Payer: Self-pay | Admitting: Emergency Medicine

## 2019-09-21 ENCOUNTER — Telehealth (INDEPENDENT_AMBULATORY_CARE_PROVIDER_SITE_OTHER): Payer: Medicare Other | Admitting: Emergency Medicine

## 2019-09-21 ENCOUNTER — Telehealth: Payer: Self-pay | Admitting: Emergency Medicine

## 2019-09-21 ENCOUNTER — Ambulatory Visit: Payer: Medicare Other | Attending: Internal Medicine

## 2019-09-21 VITALS — BP 128/58 | Temp 101.1°F | Ht 70.0 in | Wt 215.0 lb

## 2019-09-21 DIAGNOSIS — R509 Fever, unspecified: Secondary | ICD-10-CM

## 2019-09-21 DIAGNOSIS — Z20822 Contact with and (suspected) exposure to covid-19: Secondary | ICD-10-CM

## 2019-09-21 DIAGNOSIS — J029 Acute pharyngitis, unspecified: Secondary | ICD-10-CM | POA: Diagnosis not present

## 2019-09-21 DIAGNOSIS — Z20828 Contact with and (suspected) exposure to other viral communicable diseases: Secondary | ICD-10-CM | POA: Diagnosis not present

## 2019-09-21 MED ORDER — AZITHROMYCIN 250 MG PO TABS: ORAL_TABLET | ORAL | 0 refills | Status: DC

## 2019-09-21 NOTE — Progress Notes (Signed)
Telemedicine Encounter- SOAP NOTE Established Patient  This telephone encounter was conducted with the patient's (or proxy's) verbal consent via audio telecommunications: yes/no: Yes Patient was instructed to have this encounter in a suitably private space; and to only have persons present to whom they give permission to participate. In addition, patient identity was confirmed by use of name plus two identifiers (DOB and address).  I discussed the limitations, risks, security and privacy concerns of performing an evaluation and management service by telephone and the availability of in person appointments. I also discussed with the patient that there may be a patient responsible charge related to this service. The patient expressed understanding and agreed to proceed.  I spent a total of TIME; 0 MIN TO 60 MIN: 15 minutes talking with the patient or their proxy.  Chief Complaint  Patient presents with  . Sore Throat    per patient it hurts and started this morning with low grade fever - 99.9 degrees  . Cough    white mucus and burning in the chest, patient was tested for COVID 19 today at [facility]  testing site    Subjective   Gregory Mathews. is a 73 y.o. male established patient. Telephone visit today complaining of sore throat, fever, generalized achiness that started this morning.  Tested for Covid this morning.  Results not available yet.  Wife asymptomatic. No other significant symptoms.  Denies difficulty breathing or wheezing.  Denies nausea or vomiting.  Able to eat and drink okay.  Denies abdominal pain or diarrhea.  Denies rash.  HPI   Patient Active Problem List   Diagnosis Date Noted  . Uncircumcised male 03/11/2018  . Coronary artery disease involving native coronary artery of native heart without angina pectoris 11/04/2017  . Abnormal findings on diagnostic imaging of cardiovascular system 11/04/2017  . Agatston coronary artery calcium score between 100 and  199 09/22/2017  . Family history of early CAD 09/11/2017  . Mild aortic stenosis by prior echocardiogram 09/11/2017  . Metabolic syndrome 94/70/9628  . 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 BMI 31.0-31.9,adult 03/13/2016  . Hypertension associated with diabetes ([place]) 08/29/2015  . Diverticulosis of colon without hemorrhage 08/01/2015  . Statin intolerance 08/01/2015  . Basal cell carcinoma of nose 08/23/2014  . Rosacea 08/23/2014  . Seasonal allergies 01/26/2013  . Type 2 diabetes mellitus with hyperglycemia, without long-term current use of insulin ([place]) 07/09/2012  . Hyperlipidemia associated with type 2 diabetes mellitus ([place]) 07/09/2012  . Erectile dysfunction 07/09/2012    Past Medical History:  Diagnosis Date  . Allergy   . Basal cell carcinoma 01/2013   Nasal and R forearm.  [facility]  . CAD S/P percutaneous coronary angioplasty    2/19 PCI/DESx1 to mLAD, FFR 0.7 -Sierra DES 2.75 mm x 18 mm  . Cataract   . Diabetes mellitus without complication (HCC)    on PO Meds  . Essential hypertension   . Hyperlipidemia    statin intolerant -- myalgias, fatigue (tried at least 4)  . Rosacea     Current Outpatient Medications  Medication Sig Dispense Refill  . Alirocumab (PRALUENT) 150 MG/ML SOAJ Inject 150 mg into the skin every 14 (fourteen) days. 2 pen 11  . clopidogrel (PLAVIX) 75 MG tablet Take 1 tablet (75 mg total) by mouth daily. 90 tablet 3  . glipiZIDE (GLUCOTROL) 5 MG tablet Take 1 tablet (5 mg total) by mouth 2 (two) times daily before a meal. 180 tablet 3  .  losartan (COZAAR) 25 MG tablet Take 1 tablet (25 mg total) by mouth daily. 90 tablet 3  . metoprolol succinate (TOPROL-XL) 25 MG 24 hr tablet Take 1 tablet (25 mg total) by mouth daily. 90 tablet 3  . Multiple Vitamin (MULTIVITAMIN) tablet Take 1 tablet by mouth daily.    . nitroGLYCERIN (NITROSTAT) 0.4 MG SL tablet Place 1 tablet (0.4 mg total) under the tongue every 5 (five) minutes x 3 doses as needed  for chest pain. 25 tablet 3  . azithromycin (ZITHROMAX) 250 MG tablet Sig as indicated 6 tablet 0  . blood glucose meter kit and supplies Use to test blood sugar daily. Dx: E11.9 1 each 0  . glucose blood (ONETOUCH ULTRA) test strip USE TO TEST BLOOD SUGAR ONCE DAILY 100 each 11  . Lancets MISC Use to test blood sugar daily. Dx code :E11.9 100 each 3   No current facility-administered medications for this visit.    Allergies  Allergen Reactions  . Januvia [Sitagliptin] Other (See Comments)    Gi upset and kidney problems  . Lisinopril Cough  . Statins Other (See Comments)    Severe leg pain & fatigue -- tried at least 4 (even low dose)    Social History   Socioeconomic History  . Marital status: Married    Spouse name: [name]  . Number of children: 4  . Years of education: 44  . Highest education level: Not on file  Occupational History  . Occupation: bus Engineer, manufacturing systems: [employer]  Tobacco Use  . Smoking status: Former Smoker    Packs/day: 1.00    Years: 15.00    Pack years: 15.00    Quit date: 07/09/1982    Years since quitting: 37.2  . Smokeless tobacco: Never Used  Substance and Sexual Activity  . Alcohol use: No  . Drug use: No  . Sexual activity: Yes    Partners: Female  Other Topics Concern  . Not on file  Social History Narrative   Marital status: married x 40 years; happily married.   Lives: with wife       Children:  4 children; 4 grandchildren. Adult children all live locally.          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      Tobacco:  In past; smoked x 15 years; quit 35 years ago.      Alcohol: none      ADLs: independent with all ADLs.  Does not use assistant device with ambulation.      Living Will:  Has living will; desires FULL CODE; no prolonged measures.      Doctoral degree in Biblical Studies.          Diet: eats twice daily,  eggs,sausage,and fruits for breakfast; chicken (fried or broil), occassional pizza, salads, steak and pork chops.       Exercise: cardiac rehab, transition to [facility] - 45 minutes walks and resistance 5x/week   Social Determinants of Health   Financial Resource Strain:   . Difficulty of Paying Living Expenses: Not on file  Food Insecurity:   . Worried About Charity fundraiser in the Last Year: Not on file  . Ran Out of Food in the Last Year: Not on file  Transportation Needs:   . Lack of Transportation (Medical): Not on file  . Lack of Transportation (Non-Medical): Not on file  Physical Activity:   .  Days of Exercise per Week: Not on file  . Minutes of Exercise per Session: Not on file  Stress:   . Feeling of Stress : Not on file  Social Connections:   . Frequency of Communication with Friends and Family: Not on file  . Frequency of Social Gatherings with Friends and Family: Not on file  . Attends Religious Services: Not on file  . Active Member of Clubs or Organizations: Not on file  . Attends Archivist Meetings: Not on file  . Marital Status: Not on file  Intimate Partner Violence:   . Fear of Current or Ex-Partner: Not on file  . Emotionally Abused: Not on file  . Physically Abused: Not on file  . Sexually Abused: Not on file    Review of Systems  Constitutional: Negative.  Negative for chills and fever.  HENT: Positive for congestion and sore throat.   Respiratory: Negative.  Negative for cough, shortness of breath and wheezing.   Cardiovascular: Negative.  Negative for chest pain and palpitations.  Gastrointestinal: Negative.  Negative for abdominal pain, diarrhea, nausea and vomiting.  Genitourinary: Negative.   Musculoskeletal: Negative.  Negative for myalgias.  Skin: Negative.   Neurological: Negative.  Negative for dizziness and headaches.  Endo/Heme/Allergies: Negative.   All other systems reviewed and are negative.   Objective  Alert and oriented  x3 in no apparent respiratory distress. Vitals as reported by the patient: Today's Vitals   09/21/19 1641  BP: (!) 128/58  Temp: (!) 101.1 F (38.4 C)  TempSrc: Oral  SpO2: 93%  Weight: 215 lb (97.5 kg)  Height: '5\' 10"'  (1.778 m)    Leocadio was seen today for sore throat and cough.  Diagnoses and all orders for this visit:  Sore throat -     azithromycin (ZITHROMAX) 250 MG tablet; Sig as indicated  Fever, unspecified fever cause  Suspected COVID-19 virus infection  Clinically stable.  No red flag signs or symptoms.  ED precautions given.  Covid precautions given. Advised to call the office if no better or worst during the next several days.   I discussed the assessment and treatment plan with the patient. The patient was provided an opportunity to ask questions and all were answered. The patient agreed with the plan and demonstrated an understanding of the instructions.   The patient was advised to call back or seek an in-person evaluation if the symptoms worsen or if the condition fails to improve as anticipated.  I provided 15 minutes of non-face-to-face time during this encounter.  Gregory Pollen, MD  Primary Care at [facility]

## 2019-09-21 NOTE — Telephone Encounter (Signed)
Please schedule patient an tele- med visit to been evaluated  for her covid symptoms and to possible sent to have covid test done.

## 2019-09-21 NOTE — Telephone Encounter (Signed)
Called pt and set him up for a virtual for today FR

## 2019-09-21 NOTE — Telephone Encounter (Signed)
Copied from [place] [id number]. Topic: General - Other >> Sep 21, 2019 12:44 PM Mcneil, Ja-Kwan wrote: Reason for CRM: Pt spouse stated pt was around her daughter who has Covid-19. Pt spouse stated pt is experiencing severe sore throat along with a fever and she would like to know if an antibiotic could be prescribed. Pt spouse requests call back

## 2019-09-22 LAB — NOVEL CORONAVIRUS, NAA: SARS-CoV-2, NAA: DETECTED — AB

## 2019-09-23 ENCOUNTER — Other Ambulatory Visit (HOSPITAL_COMMUNITY): Payer: Self-pay | Admitting: Critical Care Medicine

## 2019-09-23 ENCOUNTER — Telehealth (HOSPITAL_COMMUNITY): Payer: Self-pay | Admitting: Critical Care Medicine

## 2019-09-23 ENCOUNTER — Encounter: Payer: Self-pay | Admitting: *Deleted

## 2019-09-23 DIAGNOSIS — I152 Hypertension secondary to endocrine disorders: Secondary | ICD-10-CM

## 2019-09-23 DIAGNOSIS — I2510 Atherosclerotic heart disease of native coronary artery without angina pectoris: Secondary | ICD-10-CM

## 2019-09-23 DIAGNOSIS — E1159 Type 2 diabetes mellitus with other circulatory complications: Secondary | ICD-10-CM

## 2019-09-23 DIAGNOSIS — E1165 Type 2 diabetes mellitus with hyperglycemia: Secondary | ICD-10-CM

## 2019-09-23 DIAGNOSIS — U071 COVID-19: Secondary | ICD-10-CM

## 2019-09-23 NOTE — Progress Notes (Signed)
  I connected by phone with Gregory Mathews. on 09/23/2019 at 5:06 PM to discuss the potential use of an new treatment for mild to moderate COVID-19 viral infection in non-hospitalized patients.  This patient is a 73 y.o. male that meets the FDA criteria for Emergency Use Authorization of bamlanivimab or casirivimab\imdevimab.  Has a (+) direct SARS-CoV-2 viral test result  Has mild or moderate COVID-19   Is ? 71 years of age and weighs ? 40 kg  Is NOT hospitalized due to COVID-19  Is NOT requiring oxygen therapy or requiring an increase in baseline oxygen flow rate due to COVID-19  Is within 10 days of symptom onset  Has at least one of the high risk factor(s) for progression to severe COVID-19 and/or hospitalization as defined in EUA.  Specific high risk criteria : Diabetes age>65, HTN, CAD   I have spoken and communicated the following to the patient or parent/caregiver:  1. FDA has authorized the emergency use of bamlanivimab and casirivimab\imdevimab for the treatment of mild to moderate COVID-19 in adults and pediatric patients with positive results of direct SARS-CoV-2 viral testing who are 68 years of age and older weighing at least 40 kg, and who are at high risk for progressing to severe COVID-19 and/or hospitalization.  2. The significant known and potential risks and benefits of bamlanivimab and casirivimab\imdevimab, and the extent to which such potential risks and benefits are unknown.  3. Information on available alternative treatments and the risks and benefits of those alternatives, including clinical trials.  4. Patients treated with bamlanivimab and casirivimab\imdevimab should continue to self-isolate and use infection control measures (e.g., wear mask, isolate, social distance, avoid sharing personal items, clean and disinfect "high touch" surfaces, and frequent handwashing) according to CDC guidelines.   5. The patient or parent/caregiver has the option to  accept or refuse bamlanivimab or casirivimab\imdevimab .  After reviewing this information with the patient, The patient agreed to proceed with receiving the bamlanimivab infusion and will be provided a copy of the Fact sheet prior to receiving the infusion.Gregory Mathews 09/23/2019 5:06 PM

## 2019-09-23 NOTE — Telephone Encounter (Signed)
I connected this patient is Covid positive from the December 29 test.  He became symptomatic on December 29 and has had cough shortness of breath fever body aches chills sore throat  He qualifies for monoclonal antibody treatment on the basis of his age coronary disease and hypertension, DM2  Called to discuss with patient about Covid symptoms and the use of bamlanivimab, a monoclonal antibody infusion for those with mild to moderate Covid symptoms and at a high risk of hospitalization.  Pt is qualified for this infusion at the [facility] infusion center due to Age > 65, Diabetes and Hypertension. He agrees to infusion

## 2019-09-27 ENCOUNTER — Other Ambulatory Visit (HOSPITAL_COMMUNITY): Payer: Medicare Other

## 2019-09-27 ENCOUNTER — Telehealth: Payer: Self-pay | Admitting: Critical Care Medicine

## 2019-09-27 NOTE — Telephone Encounter (Signed)
I connected with this patient who is Covid positive and is signed up for monoclonal antibody infusion tomorrow January 5.  The patient did have to receive an intravenous bolus of fluids by way of a home care nurse yesterday and today is continue to feel somewhat weak.  His cough and shortness of breath are improved.  He still having low-grade fever.  He has muscle aches.  I asked him to push fluids today and hold his losartan for now but do take his metoprolol.  He needs to monitor his blood sugars as well.  We will leave him on the schedule for 830 tomorrow however I told him if he worsens with her his blood pressure and/or his other symptoms he should go to the ER for evaluation in which case we will take him off the schedule  I will notify the infusion director that this patient may need a fluid bolus as part of his treatment plan tomorrow when he comes in for monoclonal antibody infusion

## 2019-09-28 ENCOUNTER — Other Ambulatory Visit: Payer: Self-pay

## 2019-09-28 ENCOUNTER — Telehealth: Payer: Self-pay | Admitting: *Deleted

## 2019-09-28 ENCOUNTER — Ambulatory Visit (HOSPITAL_COMMUNITY)
Admission: RE | Admit: 2019-09-28 | Discharge: 2019-09-28 | Disposition: A | Discharge status: Home / Self Care | Payer: Medicare Other | Source: Ambulatory Visit | Attending: Pulmonary Disease | Admitting: Pulmonary Disease

## 2019-09-28 ENCOUNTER — Telehealth: Payer: Medicare Other | Admitting: Emergency Medicine

## 2019-09-28 DIAGNOSIS — I2510 Atherosclerotic heart disease of native coronary artery without angina pectoris: Secondary | ICD-10-CM | POA: Insufficient documentation

## 2019-09-28 DIAGNOSIS — I10 Essential (primary) hypertension: Secondary | ICD-10-CM | POA: Diagnosis present

## 2019-09-28 DIAGNOSIS — E1159 Type 2 diabetes mellitus with other circulatory complications: Secondary | ICD-10-CM | POA: Diagnosis present

## 2019-09-28 DIAGNOSIS — Z23 Encounter for immunization: Secondary | ICD-10-CM | POA: Diagnosis not present

## 2019-09-28 DIAGNOSIS — U071 COVID-19: Secondary | ICD-10-CM | POA: Insufficient documentation

## 2019-09-28 DIAGNOSIS — E1165 Type 2 diabetes mellitus with hyperglycemia: Secondary | ICD-10-CM | POA: Insufficient documentation

## 2019-09-28 DIAGNOSIS — I152 Hypertension secondary to endocrine disorders: Secondary | ICD-10-CM

## 2019-09-28 MED ORDER — METHYLPREDNISOLONE SODIUM SUCC 125 MG IJ SOLR: 125.0000 mg | Freq: Once | INTRAMUSCULAR | Status: DC | PRN

## 2019-09-28 MED ORDER — FAMOTIDINE IN NACL 20-0.9 MG/50ML-% IV SOLN: 20.0000 mg | Freq: Once | INTRAVENOUS | Status: DC | PRN

## 2019-09-28 MED ORDER — SODIUM CHLORIDE 0.9 % IV SOLN
INTRAVENOUS | Status: DC | PRN
  Administered 2019-09-28: 250 mL via INTRAVENOUS

## 2019-09-28 MED ORDER — ALBUTEROL SULFATE HFA 108 (90 BASE) MCG/ACT IN AERS: 2.0000 | INHALATION_SPRAY | Freq: Once | RESPIRATORY_TRACT | Status: DC | PRN

## 2019-09-28 MED ORDER — SODIUM CHLORIDE 0.9 % IV SOLN
700.0000 mg | Freq: Once | INTRAVENOUS | Status: AC
  Administered 2019-09-28: 09:00:00 700 mg via INTRAVENOUS
  Filled 2019-09-28: qty 20

## 2019-09-28 MED ORDER — DIPHENHYDRAMINE HCL 50 MG/ML IJ SOLN: 50.0000 mg | Freq: Once | INTRAMUSCULAR | Status: DC | PRN

## 2019-09-28 MED ORDER — EPINEPHRINE 0.3 MG/0.3ML IJ SOAJ: 0.3000 mg | Freq: Once | INTRAMUSCULAR | Status: DC | PRN

## 2019-09-28 NOTE — Discharge Instructions (Signed)

## 2019-09-28 NOTE — Progress Notes (Addendum)
  Diagnosis: COVID-19  Physician: Dr. Joya Gaskins  Procedure: Covid Infusion Clinic Med: bamlanivimab infusion - Provided patient with bamlanimivab fact sheet for patients, parents and caregivers prior to infusion.  Complications: No immediate complications noted.  Discharge: Discharged home   Alexxa Sabet, Cleaster Cor[place] 09/28/2019

## 2019-09-28 NOTE — Telephone Encounter (Signed)
Called mobile and home numbers to triage patient for appointment at 1400, no answer left message in voice mail.

## 2019-09-29 ENCOUNTER — Encounter: Payer: Self-pay | Admitting: Emergency Medicine

## 2019-09-29 ENCOUNTER — Telehealth (INDEPENDENT_AMBULATORY_CARE_PROVIDER_SITE_OTHER): Payer: Medicare Other | Admitting: Emergency Medicine

## 2019-09-29 ENCOUNTER — Other Ambulatory Visit: Payer: Self-pay

## 2019-09-29 DIAGNOSIS — U071 COVID-19: Secondary | ICD-10-CM

## 2019-09-29 DIAGNOSIS — R05 Cough: Secondary | ICD-10-CM

## 2019-09-29 DIAGNOSIS — R059 Cough, unspecified: Secondary | ICD-10-CM

## 2019-09-29 MED ORDER — HYDROCODONE-HOMATROPINE 5-1.5 MG/5ML PO SYRP: 5.0000 mL | ORAL_SOLUTION | Freq: Every evening | ORAL | 0 refills | Status: DC | PRN

## 2019-09-29 NOTE — Progress Notes (Signed)
Telemedicine Encounter- SOAP NOTE Established Patient  This telephone encounter was conducted with the patient's (or proxy's) verbal consent via audio telecommunications: yes/no: Yes Patient was instructed to have this encounter in a suitably private space; and to only have persons present to whom they give permission to participate. In addition, patient identity was confirmed by use of name plus two identifiers (DOB and address).  I discussed the limitations, risks, security and privacy concerns of performing an evaluation and management service by telephone and the availability of in person appointments. I also discussed with the patient that there may be a patient responsible charge related to this service. The patient expressed understanding and agreed to proceed.  I spent a total of TIME; 0 MIN TO 60 MIN: 15 minutes talking with the patient or their proxy.  Chief Complaint  Patient presents with  . Follow-up    Pt has COVID-19. tested positive on 09/21/2019. pt is still having a fever  that comes and gos, headaches, loss of appitite, and productive cough with white flem.  . Medication Refill    need refills on cough syrup, nitroglycerin    Subjective   Gregory Mathews. is a 74 y.o. male established patient. Telephone visit today for follow-up of Covid 19 infection.  Day 9.  Still having some cough, intermittent headaches, loss of appetite, occasional fever.  Denies difficulty breathing, chest pain, wheezing, abdominal pain or diarrhea.  Still able to eat and drink.  Denies nausea or vomiting.  HPI   Patient Active Problem List   Diagnosis Date Noted  . Uncircumcised male 03/11/2018  . Coronary artery disease involving native coronary artery of native heart without angina pectoris 11/04/2017  . Abnormal findings on diagnostic imaging of cardiovascular system 11/04/2017  . Agatston coronary artery calcium score between 100 and 199 09/22/2017  . Family history of early CAD  09/11/2017  . Mild aortic stenosis by prior echocardiogram 09/11/2017  . Metabolic syndrome 63/81/7711  . 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 BMI 31.0-31.9,adult 03/13/2016  . Hypertension associated with diabetes ([place]) 08/29/2015  . Diverticulosis of colon without hemorrhage 08/01/2015  . Statin intolerance 08/01/2015  . Basal cell carcinoma of nose 08/23/2014  . Rosacea 08/23/2014  . Seasonal allergies 01/26/2013  . Type 2 diabetes mellitus with hyperglycemia, without long-term current use of insulin ([place]) 07/09/2012  . Hyperlipidemia associated with type 2 diabetes mellitus ([place]) 07/09/2012  . Erectile dysfunction 07/09/2012    Past Medical History:  Diagnosis Date  . Allergy   . Basal cell carcinoma 01/2013   Nasal and R forearm.  [facility]  . CAD S/P percutaneous coronary angioplasty    2/19 PCI/DESx1 to mLAD, FFR 0.7 -Sierra DES 2.75 mm x 18 mm  . Cataract   . Diabetes mellitus without complication (HCC)    on PO Meds  . Essential hypertension   . Hyperlipidemia    statin intolerant -- myalgias, fatigue (tried at least 4)  . Rosacea     Current Outpatient Medications  Medication Sig Dispense Refill  . Alirocumab (PRALUENT) 150 MG/ML SOAJ Inject 150 mg into the skin every 14 (fourteen) days. 2 pen 11  . azithromycin (ZITHROMAX) 250 MG tablet Sig as indicated 6 tablet 0  . blood glucose meter kit and supplies Use to test blood sugar daily. Dx: E11.9 1 each 0  . clopidogrel (PLAVIX) 75 MG tablet Take 1 tablet (75 mg total) by mouth daily. 90 tablet 3  . glipiZIDE (GLUCOTROL) 5 MG tablet Take  1 tablet (5 mg total) by mouth 2 (two) times daily before a meal. 180 tablet 3  . glucose blood (ONETOUCH ULTRA) test strip USE TO TEST BLOOD SUGAR ONCE DAILY 100 each 11  . HYDROcodone-homatropine (HYCODAN) 5-1.5 MG/5ML syrup Take 5 mLs by mouth at bedtime as needed for cough. 120 mL 0  . Lancets MISC Use to test blood sugar daily. Dx code :E11.9 100 each 3  . losartan  (COZAAR) 25 MG tablet Take 1 tablet (25 mg total) by mouth daily. 90 tablet 3  . metoprolol succinate (TOPROL-XL) 25 MG 24 hr tablet Take 1 tablet (25 mg total) by mouth daily. 90 tablet 3  . Multiple Vitamin (MULTIVITAMIN) tablet Take 1 tablet by mouth daily.    . nitroGLYCERIN (NITROSTAT) 0.4 MG SL tablet Place 1 tablet (0.4 mg total) under the tongue every 5 (five) minutes x 3 doses as needed for chest pain. 25 tablet 3   No current facility-administered medications for this visit.    Allergies  Allergen Reactions  . Januvia [Sitagliptin] Other (See Comments)    Gi upset and kidney problems  . Lisinopril Cough  . Statins Other (See Comments)    Severe leg pain & fatigue -- tried at least 4 (even low dose)    Social History   Socioeconomic History  . Marital status: Married    Spouse name: [name]  . Number of children: 4  . Years of education: 39  . Highest education level: Not on file  Occupational History  . Occupation: bus Engineer, manufacturing systems: [employer]  Tobacco Use  . Smoking status: Former Smoker    Packs/day: 1.00    Years: 15.00    Pack years: 15.00    Quit date: 07/09/1982    Years since quitting: 37.2  . Smokeless tobacco: Never Used  Substance and Sexual Activity  . Alcohol use: No  . Drug use: No  . Sexual activity: Yes    Partners: Female  Other Topics Concern  . Not on file  Social History Narrative   Marital status: married x 65 years; happily married.   Lives: with wife       Children:  4 children; 4 grandchildren. Adult children all live locally.          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      Tobacco:  In past; smoked x 15 years; quit 35 years ago.      Alcohol: none      ADLs: independent with all ADLs.  Does not use assistant device with ambulation.      Living Will:  Has living will; desires FULL CODE; no prolonged measures.      Doctoral  degree in Biblical Studies.          Diet: eats twice daily, eggs,sausage,and fruits for breakfast; chicken (fried or broil), occassional pizza, salads, steak and pork chops.       Exercise: cardiac rehab, transition to [facility] - 45 minutes walks and resistance 5x/week   Social Determinants of Health   Financial Resource Strain:   . Difficulty of Paying Living Expenses: Not on file  Food Insecurity:   . Worried About Charity fundraiser in the Last Year: Not on file  . Ran Out of Food in the Last Year: Not on file  Transportation Needs:   . Lack of Transportation (Medical): Not on file  . Lack of Transportation (Non-Medical):  Not on file  Physical Activity:   . Days of Exercise per Week: Not on file  . Minutes of Exercise per Session: Not on file  Stress:   . Feeling of Stress : Not on file  Social Connections:   . Frequency of Communication with Friends and Family: Not on file  . Frequency of Social Gatherings with Friends and Family: Not on file  . Attends Religious Services: Not on file  . Active Member of Clubs or Organizations: Not on file  . Attends Archivist Meetings: Not on file  . Marital Status: Not on file  Intimate Partner Violence:   . Fear of Current or Ex-Partner: Not on file  . Emotionally Abused: Not on file  . Physically Abused: Not on file  . Sexually Abused: Not on file    Review of Systems  Constitutional: Positive for fever and malaise/fatigue.  HENT: Positive for congestion and sore throat.   Respiratory: Positive for cough. Negative for sputum production, shortness of breath and wheezing.   Cardiovascular: Negative.  Negative for chest pain and palpitations.  Gastrointestinal: Negative.  Negative for abdominal pain, diarrhea, nausea and vomiting.  Genitourinary: Negative.   Musculoskeletal: Negative.   Skin: Negative.  Negative for rash.  Neurological: Negative.  Negative for dizziness and headaches.  All other systems reviewed and are  negative.   Objective  Alert and oriented x3 in no apparent respiratory distress. Vitals as reported by the patient: There were no vitals filed for this visit.  Nabeel was seen today for follow-up and medication refill.  Diagnoses and all orders for this visit:  COVID-19 virus infection  Cough -     HYDROcodone-homatropine (HYCODAN) 5-1.5 MG/5ML syrup; Take 5 mLs by mouth at bedtime as needed for cough.  Clinically stable.  No red flag signs or symptoms.  Slowly improving.  Continue present treatment.  Contact the office if no better or worse during the next several days.   I discussed the assessment and treatment plan with the patient. The patient was provided an opportunity to ask questions and all were answered. The patient agreed with the plan and demonstrated an understanding of the instructions.   The patient was advised to call back or seek an in-person evaluation if the symptoms worsen or if the condition fails to improve as anticipated.  I provided 15 minutes of non-face-to-face time during this encounter.  Horald Pollen, MD  Primary Care at [facility]

## 2019-09-30 ENCOUNTER — Other Ambulatory Visit: Payer: Self-pay | Admitting: Emergency Medicine

## 2019-09-30 ENCOUNTER — Telehealth: Payer: Self-pay | Admitting: Emergency Medicine

## 2019-09-30 DIAGNOSIS — R05 Cough: Secondary | ICD-10-CM

## 2019-09-30 DIAGNOSIS — R059 Cough, unspecified: Secondary | ICD-10-CM

## 2019-09-30 MED ORDER — HYDROCOD POLST-CPM POLST ER 10-8 MG/5ML PO SUER: 5.0000 mL | Freq: Two times a day (BID) | ORAL | 0 refills | Status: DC | PRN

## 2019-09-30 NOTE — Telephone Encounter (Signed)
Prescription sent. Thanks

## 2019-09-30 NOTE — Telephone Encounter (Signed)
Please advise 

## 2019-09-30 NOTE — Telephone Encounter (Signed)
Pt had tele visit yesterday and was prescribed HYDROcodone-homatropine (HYCODAN) 5-1.5 MG/5ML syrup  Pt states this does not work for him.  The only thing that works for him is Hydrocodone chlorthen ER syrup.  Pt wants the dr to call this cough med in.   [id number] - [place], [place]. Phone:  [phone]  Fax:  [phone]

## 2019-10-04 ENCOUNTER — Encounter: Payer: Self-pay | Admitting: Emergency Medicine

## 2019-10-05 ENCOUNTER — Encounter: Payer: Medicare Other | Admitting: Emergency Medicine

## 2019-10-06 ENCOUNTER — Encounter: Payer: Medicare Other | Admitting: Emergency Medicine

## 2019-10-12 ENCOUNTER — Telehealth: Payer: Self-pay | Admitting: Emergency Medicine

## 2019-10-12 NOTE — Telephone Encounter (Signed)
Pt is needing a call back regarding a no show letter / please call at [phone] / pt had held over at infusion center and was not able to call or get here .  No show should have  be waived .  Doesn't like tone of letter .

## 2019-10-13 NOTE — Telephone Encounter (Signed)
Spoke with pt and addressed NS charge. Issue has been resolved

## 2019-10-19 ENCOUNTER — Ambulatory Visit (HOSPITAL_COMMUNITY): Payer: Medicare Other | Attending: Cardiovascular Disease

## 2019-10-19 ENCOUNTER — Other Ambulatory Visit: Payer: Self-pay

## 2019-10-19 DIAGNOSIS — I2510 Atherosclerotic heart disease of native coronary artery without angina pectoris: Secondary | ICD-10-CM | POA: Diagnosis not present

## 2019-10-19 DIAGNOSIS — I350 Nonrheumatic aortic (valve) stenosis: Secondary | ICD-10-CM | POA: Diagnosis not present

## 2019-10-20 ENCOUNTER — Encounter: Payer: Medicare Other | Admitting: Emergency Medicine

## 2019-10-20 DIAGNOSIS — L570 Actinic keratosis: Secondary | ICD-10-CM | POA: Diagnosis not present

## 2019-10-20 DIAGNOSIS — C44629 Squamous cell carcinoma of skin of left upper limb, including shoulder: Secondary | ICD-10-CM | POA: Diagnosis not present

## 2019-10-20 DIAGNOSIS — D485 Neoplasm of uncertain behavior of skin: Secondary | ICD-10-CM | POA: Diagnosis not present

## 2019-10-26 ENCOUNTER — Telehealth: Payer: Self-pay

## 2019-10-26 NOTE — Telephone Encounter (Signed)
Left message for patient to call back to discuss Echo results.

## 2019-10-26 NOTE — Telephone Encounter (Signed)
-----   Message from Leonie Man, MD sent at 10/20/2019 11:04 PM EST ----- Echo results: EF 60 to 65%.  Normal LV function.  Normal right ventricular function.  There is mild left atrial dilation which probably goes along with some mild relaxation normality.  There is very mild aortic stenosis -calcium buildup in the valve that is just starting to cause reduction in flow across the valve.  We can watch this intermittently.  Really no major difference then December 2018.  We can probably check again another 2 years.  Glenetta Hew, MD

## 2019-10-26 NOTE — Telephone Encounter (Signed)
Follow up ° ° °Patient is returning your call. Please call. ° ° ° °

## 2019-11-03 DIAGNOSIS — C44619 Basal cell carcinoma of skin of left upper limb, including shoulder: Secondary | ICD-10-CM | POA: Diagnosis not present

## 2019-11-16 DIAGNOSIS — D485 Neoplasm of uncertain behavior of skin: Secondary | ICD-10-CM | POA: Diagnosis not present

## 2019-11-18 ENCOUNTER — Ambulatory Visit (INDEPENDENT_AMBULATORY_CARE_PROVIDER_SITE_OTHER): Payer: Medicare Other | Admitting: Emergency Medicine

## 2019-11-18 ENCOUNTER — Other Ambulatory Visit: Payer: Self-pay

## 2019-11-18 ENCOUNTER — Encounter: Payer: Self-pay | Admitting: Emergency Medicine

## 2019-11-18 VITALS — BP 144/74 | HR 62 | Temp 98.1°F | Resp 16 | Ht 69.0 in | Wt 211.0 lb

## 2019-11-18 DIAGNOSIS — E1165 Type 2 diabetes mellitus with hyperglycemia: Secondary | ICD-10-CM

## 2019-11-18 DIAGNOSIS — C4491 Basal cell carcinoma of skin, unspecified: Secondary | ICD-10-CM

## 2019-11-18 DIAGNOSIS — E1159 Type 2 diabetes mellitus with other circulatory complications: Secondary | ICD-10-CM | POA: Diagnosis not present

## 2019-11-18 DIAGNOSIS — E1169 Type 2 diabetes mellitus with other specified complication: Secondary | ICD-10-CM

## 2019-11-18 DIAGNOSIS — E785 Hyperlipidemia, unspecified: Secondary | ICD-10-CM

## 2019-11-18 DIAGNOSIS — Z130 Encounter for screening for diseases of the blood and blood-forming organs and certain disorders involving the immune mechanism: Secondary | ICD-10-CM

## 2019-11-18 DIAGNOSIS — Z8616 Personal history of COVID-19: Secondary | ICD-10-CM

## 2019-11-18 DIAGNOSIS — Z0000 Encounter for general adult medical examination without abnormal findings: Secondary | ICD-10-CM

## 2019-11-18 DIAGNOSIS — K5730 Diverticulosis of large intestine without perforation or abscess without bleeding: Secondary | ICD-10-CM | POA: Diagnosis not present

## 2019-11-18 DIAGNOSIS — I10 Essential (primary) hypertension: Secondary | ICD-10-CM | POA: Diagnosis not present

## 2019-11-18 DIAGNOSIS — U071 COVID-19: Secondary | ICD-10-CM

## 2019-11-18 DIAGNOSIS — Z0001 Encounter for general adult medical examination with abnormal findings: Secondary | ICD-10-CM

## 2019-11-18 DIAGNOSIS — I152 Hypertension secondary to endocrine disorders: Secondary | ICD-10-CM

## 2019-11-18 DIAGNOSIS — I2510 Atherosclerotic heart disease of native coronary artery without angina pectoris: Secondary | ICD-10-CM

## 2019-11-18 DIAGNOSIS — I350 Nonrheumatic aortic (valve) stenosis: Secondary | ICD-10-CM

## 2019-11-18 LAB — POCT GLYCOSYLATED HEMOGLOBIN (HGB A1C): Hemoglobin A1C: 7.7 % — AB (ref 4.0–5.6)

## 2019-11-18 LAB — GLUCOSE, POCT (MANUAL RESULT ENTRY): POC Glucose: 151 mg/dl — AB (ref 70–99)

## 2019-11-18 NOTE — Progress Notes (Signed)
BP Readings from Last 3 Encounters:  09/28/19 (!) 132/59  09/21/19 (!) 128/58  08/09/19 (!) 149/73   Lab Results  Component Value Date   HGBA1C 7.1 (A) 07/08/2019   Lab Results  Component Value Date   CHOL 194 04/07/2019   HDL 41 04/07/2019   LDLCALC 121 (H) 04/07/2019   TRIG 158 (H) 04/07/2019   CHOLHDL 4.7 04/07/2019   Wt Readings from Last 3 Encounters:  09/21/19 215 lb (97.5 kg)  08/09/19 214 lb (97.1 kg)  07/08/19 212 lb 3.2 oz (96.3 kg)   Lab Results  Component Value Date   CREATININE 0.90 04/07/2019   BUN 22 04/07/2019   NA 139 04/07/2019   K 5.2 04/07/2019   CL 101 04/07/2019   CO2 19 (L) 04/07/2019   Oaklan Menta Jr. 74 y.o.   Chief Complaint  Patient presents with  . Annual Exam    HISTORY OF PRESENT ILLNESS: This is a 74 y.o. male here for his annual exam. Patient has multiple chronic medical problems including diabetes, hypertension, dyslipidemia, mild aortic stenosis, diverticulosis, recent Covid infection, coronary artery disease status post stent placement.  Problem list reviewed with patient.  Medications reviewed with patient. Most recent office visit notes reviewed.  Most recent blood work results reviewed.  Recent echocardiogram done last January reviewed. Colonoscopy done 5 years ago reviewed.  Chest x-ray done 3 years ago reviewed. Patient has a history of basal skin cancer on his left arm.  Under control.  Sees dermatologist regularly.  Status post surgery. Has no complaints or medical concerns today.  HPI   Prior to Admission medications   Medication Sig Start Date End Date Taking? Authorizing Provider  Alirocumab (PRALUENT) 150 MG/ML SOAJ Inject 150 mg into the skin every 14 (fourteen) days. 08/11/19  Yes Leonie Man, MD  clopidogrel (PLAVIX) 75 MG tablet Take 1 tablet (75 mg total) by mouth daily. 07/08/19  Yes SagardiaInes Bloomer, MD  Lancets MISC Use to test blood sugar daily. Dx code :E11.9 07/08/19  Yes Josuha Fontanez, Ines Bloomer, MD  losartan (COZAAR) 25 MG tablet Take 1 tablet (25 mg total) by mouth daily. 07/08/19  Yes Marcha Licklider, Ines Bloomer, MD  metoprolol succinate (TOPROL-XL) 25 MG 24 hr tablet Take 1 tablet (25 mg total) by mouth daily. 07/08/19  Yes Dilan Novosad, Ines Bloomer, MD  Multiple Vitamin (MULTIVITAMIN) tablet Take 1 tablet by mouth daily.   Yes [provider]  blood glucose meter kit and supplies Use to test blood sugar daily. Dx: E11.9 10/11/15   Tereasa Coop, PA-C  glipiZIDE (GLUCOTROL) 5 MG tablet Take 1 tablet (5 mg total) by mouth 2 (two) times daily before a meal. 07/08/19 10/06/19  Peony Barner, Ines Bloomer, MD  glucose blood [facility] ULTRA) test strip USE TO TEST BLOOD SUGAR ONCE DAILY 07/08/19   Horald Pollen, MD  nitroGLYCERIN (NITROSTAT) 0.4 MG SL tablet Place 1 tablet (0.4 mg total) under the tongue every 5 (five) minutes x 3 doses as needed for chest pain. Patient not taking: Reported on 11/18/2019 01/19/18   Leonie Man, MD    Allergies  Allergen Reactions  . Januvia [Sitagliptin] Other (See Comments)    Gi upset and kidney problems  . Lisinopril Cough  . Statins Other (See Comments)    Severe leg pain & fatigue -- tried at least 4 (even low dose)    Patient Active Problem List   Diagnosis Date Noted  . Uncircumcised male 03/11/2018  . Coronary artery disease  involving native coronary artery of native heart without angina pectoris 11/04/2017  . Abnormal findings on diagnostic imaging of cardiovascular system 11/04/2017  . Agatston coronary artery calcium score between 100 and 199 09/22/2017  . Family history of early CAD 09/11/2017  . Mild aortic stenosis by prior echocardiogram 09/11/2017  . Metabolic syndrome 48/54/6270  . 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 BMI 31.0-31.9,adult 03/13/2016  . Hypertension associated with diabetes ([place]) 08/29/2015  . Diverticulosis of colon without hemorrhage 08/01/2015  . Statin intolerance 08/01/2015  . Basal cell carcinoma  of nose 08/23/2014  . Rosacea 08/23/2014  . Seasonal allergies 01/26/2013  . Type 2 diabetes mellitus with hyperglycemia, without long-term current use of insulin ([place]) 07/09/2012  . Hyperlipidemia associated with type 2 diabetes mellitus ([place]) 07/09/2012  . Erectile dysfunction 07/09/2012    Past Medical History:  Diagnosis Date  . Allergy   . Basal cell carcinoma 01/2013   Nasal and R forearm.  [facility]  . CAD S/P percutaneous coronary angioplasty    2/19 PCI/DESx1 to mLAD, FFR 0.7 -Sierra DES 2.75 mm x 18 mm  . Cataract   . Diabetes mellitus without complication (HCC)    on PO Meds  . Essential hypertension   . Heart murmur   . Hyperlipidemia    statin intolerant -- myalgias, fatigue (tried at least 4)  . Rosacea     Past Surgical History:  Procedure Laterality Date  . basal cell removal     nose and wrist   . BELPHAROPTOSIS REPAIR    . CORONARY CT ANGIOGRAM  09/2017   Coronary Calcium Score: 111. Coronary calcification noted in the LEFT MAIN (LM) and prox LAD.  LM < 50% calcified stenosis.  Mid LAD 50-75% plaque noted CT FFR --> positive at 0.80.  Recommend CARDIAC CATHETERIZATION  . CORONARY STENT INTERVENTION N/A 11/12/2017   Procedure: CORONARY STENT INTERVENTION;  Surgeon: Leonie Man, MD;  Location: [place] [facility];  Service: Cardiovascular:  DES PCI:  STENT SIERRA 2.75 X 18 MM - Post intervention, there is a 0% residual stenosis.  Marland Kitchen ETT/GXT: Graded Exercise Tolerance Test  08/2015    Exercised for 9:01 min --> blunted blood pressure response no EKG changes.  No chest pain.  Low Risk  . EYE SURGERY     Cataract surgery  . eyelid surgery    . INTRAVASCULAR PRESSURE WIRE/FFR STUDY N/A 11/12/2017   Procedure: INTRAVASCULAR PRESSURE WIRE/FFR STUDY;  Surgeon: Leonie Man, MD;  Location: [place] [facility];  Service: Cardiovascular;  Laterality: LAD -FFR 0.76  . LEFT HEART CATH AND CORONARY ANGIOGRAPHY N/A 11/12/2017   Procedure: LEFT HEART CATH AND  CORONARY ANGIOGRAPHY;  Surgeon: Leonie Man, MD;  Location: [facility]: 70% P-M LAD (FFR 0.76).  40% mid LAD, 50% ostial ramus.  EF 55-60%.  Mildly elevated LVEDP.  Marland Kitchen SHOULDER OPEN ROTATOR CUFF REPAIR  1983  . TRANSTHORACIC ECHOCARDIOGRAM  08/2017   EF 55-60%.  Mild aortic stenosis (mean gradient 11 mmH.)  GR 1 DD.  Mild LA dilation.    Social History   Socioeconomic History  . Marital status: Married    Spouse name: [name]  . Number of children: 4  . Years of education: 46  . Highest education level: Not on file  Occupational History  . Occupation: bus Engineer, manufacturing systems: [employer]  Tobacco Use  . Smoking status: Former Smoker    Packs/day: 1.00    Years: 15.00  Pack years: 15.00    Quit date: 07/09/1982    Years since quitting: 37.3  . Smokeless tobacco: Never Used  Substance and Sexual Activity  . Alcohol use: No  . Drug use: No  . Sexual activity: Yes    Partners: Female  Other Topics Concern  . Not on file  Social History Narrative   Marital status: married x 22 years; happily married.   Lives: with wife       Children:  4 children; 4 grandchildren. Adult children all live locally.          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      Tobacco:  In past; smoked x 15 years; quit 35 years ago.      Alcohol: none      ADLs: independent with all ADLs.  Does not use assistant device with ambulation.      Living Will:  Has living will; desires FULL CODE; no prolonged measures.      Doctoral degree in Biblical Studies.          Diet: eats twice daily, eggs,sausage,and fruits for breakfast; chicken (fried or broil), occassional pizza, salads, steak and pork chops.       Exercise: cardiac rehab, transition to [facility] - 45 minutes walks and resistance 5x/week   Social Determinants of Health   Financial Resource Strain:   . Difficulty of Paying Living Expenses:  Not on file  Food Insecurity:   . Worried About Charity fundraiser in the Last Year: Not on file  . Ran Out of Food in the Last Year: Not on file  Transportation Needs:   . Lack of Transportation (Medical): Not on file  . Lack of Transportation (Non-Medical): Not on file  Physical Activity:   . Days of Exercise per Week: Not on file  . Minutes of Exercise per Session: Not on file  Stress:   . Feeling of Stress : Not on file  Social Connections:   . Frequency of Communication with Friends and Family: Not on file  . Frequency of Social Gatherings with Friends and Family: Not on file  . Attends Religious Services: Not on file  . Active Member of Clubs or Organizations: Not on file  . Attends Archivist Meetings: Not on file  . Marital Status: Not on file  Intimate Partner Violence:   . Fear of Current or Ex-Partner: Not on file  . Emotionally Abused: Not on file  . Physically Abused: Not on file  . Sexually Abused: Not on file    Family History  Problem Relation Age of Onset  . Leukemia Mother   . Stroke Father 65       as a complication of CABG  . Colon cancer Father 39  . Cancer Father 16       Colon cancer  . Coronary artery disease Father 87       - Sx was DOE - MV CAD  . Peripheral Artery Disease Father        presumptive  . Diabetes Sister   . Sudden Cardiac Death Sister 49       presumed MI  . Heart attack Brother 81       During throat surgery  . Congestive Heart Failure Brother        Died @ 39 - ? CHF / ICM  . Cancer Maternal Grandmother   . Stroke Maternal Grandfather   .  Diabetes Brother 64       on lots of meds  . Diabetes Brother   . Cancer Brother        colon cancer  . Rheumatic fever Brother        childhood RF --> Valvular Dz - valve Sgx,  died young     Review of Systems  Constitutional: Negative.  Negative for chills and fever.  HENT: Negative.  Negative for congestion and sore throat.   Eyes:       Left eye intermittent  problems.  Has appointment to see ophthalmologist next March.  Respiratory: Negative.  Negative for cough, sputum production and shortness of breath.   Cardiovascular: Negative.  Negative for chest pain and palpitations.  Gastrointestinal: Negative.  Negative for abdominal pain, blood in stool, diarrhea, melena, nausea and vomiting.  Genitourinary: Negative.  Negative for dysuria, flank pain, frequency and hematuria.  Musculoskeletal: Negative.  Negative for back pain, myalgias and neck pain.  Skin: Negative.  Negative for rash.  Neurological: Negative.  Negative for dizziness and headaches.  Endo/Heme/Allergies: Negative.   All other systems reviewed and are negative.   There were no vitals filed for this visit.  Physical Exam Vitals reviewed.  Constitutional:      Appearance: Normal appearance.  HENT:     Head: Normocephalic.  Eyes:     Extraocular Movements: Extraocular movements intact.     Conjunctiva/sclera: Conjunctivae normal.     Pupils: Pupils are equal, round, and reactive to light.  Cardiovascular:     Rate and Rhythm: Normal rate and regular rhythm.     Heart sounds: Murmur (Systolic, aortic, 3/6) present.  Pulmonary:     Effort: Pulmonary effort is normal.     Breath sounds: Normal breath sounds.  Abdominal:     General: There is no distension.     Palpations: Abdomen is soft.     Tenderness: There is no abdominal tenderness.  Musculoskeletal:     Cervical back: Normal range of motion and neck supple.     Comments: Lower extremities: No edema.  No tenderness.  No skin changes.  Warm to touch.  Good peripheral circulation with excellent pulses and neurovascularly intact.  Skin:    General: Skin is warm and dry.     Capillary Refill: Capillary refill takes less than 2 seconds.  Neurological:     General: No focal deficit present.     Mental Status: He is alert and oriented to person, place, and time.  Psychiatric:        Mood and Affect: Mood normal.         Behavior: Behavior normal.      ASSESSMENT & PLAN: Avian was seen today for annual exam.  Diagnoses and all orders for this visit:  Routine general medical examination at a health care facility  Type 2 diabetes mellitus with hyperglycemia, without long-term current use of insulin (HCC) -     POCT glycosylated hemoglobin (Hb A1C) -     POCT glucose (manual entry)  Essential hypertension  Hypertension associated with diabetes ([place]) -     CBC with Differential/Platelet -     Comprehensive metabolic panel -     HM Diabetes Foot Exam  Diverticulosis of colon without hemorrhage  Mild aortic stenosis by prior echocardiogram  Coronary artery disease involving native coronary artery of native heart without angina pectoris  Hyperlipidemia associated with type 2 diabetes mellitus ([place]) -     Lipid panel  COVID-19 virus infection  Comments: Recent and improved Orders: -     SAR CoV2 Serology (COVID 19)AB(IGG)IA  Screening for deficiency anemia  Skin cancer, basal cell    Patient Instructions       If you have lab work done today you will be contacted with your lab results within the next 2 weeks.  If you have not heard from [place] then please contact us. The fastest way to get your results is to register for My Chart.   IF you received an x-ray today, you will receive an invoice from [facility]. Please contact [facility] at [phone] with questions or concerns regarding your invoice.   IF you received labwork today, you will receive an invoice from [facility]. Please contact [facility] at [phone] with questions or concerns regarding your invoice.   Our billing staff will not be able to assist you with questions regarding bills from these companies.  You will be contacted with the lab results as soon as they are available. The fastest way to get your results is to activate your My Chart account. Instructions are located on the last page of this  paperwork. If you have not heard from [place] regarding the results in 2 weeks, please contact this office.      Health Maintenance, Male Adopting a healthy lifestyle and getting preventive care are important in promoting health and wellness. Ask your health care provider about:  The right schedule for you to have regular tests and exams.  Things you can do on your own to prevent diseases and keep yourself healthy. What should I know about diet, weight, and exercise? Eat a healthy diet   Eat a diet that includes plenty of vegetables, fruits, low-fat dairy products, and lean protein.  Do not eat a lot of foods that are high in solid fats, added sugars, or sodium. Maintain a healthy weight Body mass index (BMI) is a measurement that can be used to identify possible weight problems. It estimates body fat based on height and weight. Your health care provider can help determine your BMI and help you achieve or maintain a healthy weight. Get regular exercise Get regular exercise. This is one of the most important things you can do for your health. Most adults should:  Exercise for at least 150 minutes each week. The exercise should increase your heart rate and make you sweat (moderate-intensity exercise).  Do strengthening exercises at least twice a week. This is in addition to the moderate-intensity exercise.  Spend less time sitting. Even light physical activity can be beneficial. Watch cholesterol and blood lipids Have your blood tested for lipids and cholesterol at 72 years of age, then have this test every 5 years. You may need to have your cholesterol levels checked more often if:  Your lipid or cholesterol levels are high.  You are older than 72 years of age.  You are at high risk for heart disease. What should I know about cancer screening? Many types of cancers can be detected early and may often be prevented. Depending on your health history and family history, you may need to  have cancer screening at various ages. This may include screening for:  Colorectal cancer.  Prostate cancer.  Skin cancer.  Lung cancer. What should I know about heart disease, diabetes, and high blood pressure? Blood pressure and heart disease  High blood pressure causes heart disease and increases the risk of stroke. This is more likely to develop in people who have high blood pressure readings,  are of African descent, or are overweight.  Talk with your health care provider about your target blood pressure readings.  Have your blood pressure checked: ? Every 3-5 years if you are 13-91 years of age. ? Every year if you are 72 years old or older.  If you are between the ages of 15 and 40 and are a current or former smoker, ask your health care provider if you should have a one-time screening for abdominal aortic aneurysm (AAA). Diabetes Have regular diabetes screenings. This checks your fasting blood sugar level. Have the screening done:  Once every three years after age 34 if you are at a normal weight and have a low risk for diabetes.  More often and at a younger age if you are overweight or have a high risk for diabetes. What should I know about preventing infection? Hepatitis B If you have a higher risk for hepatitis B, you should be screened for this virus. Talk with your health care provider to find out if you are at risk for hepatitis B infection. Hepatitis C Blood testing is recommended for:  Everyone born from [birth year] through 1963.  Anyone with known risk factors for hepatitis C. Sexually transmitted infections (STIs)  You should be screened each year for STIs, including gonorrhea and chlamydia, if: ? You are sexually active and are younger than 72 years of age. ? You are older than 72 years of age and your health care provider tells you that you are at risk for this type of infection. ? Your sexual activity has changed since you were last screened, and you are at  increased risk for chlamydia or gonorrhea. Ask your health care provider if you are at risk.  Ask your health care provider about whether you are at high risk for HIV. Your health care provider may recommend a prescription medicine to help prevent HIV infection. If you choose to take medicine to prevent HIV, you should first get tested for HIV. You should then be tested every 3 months for as long as you are taking the medicine. Follow these instructions at home: Lifestyle  Do not use any products that contain nicotine or tobacco, such as cigarettes, e-cigarettes, and chewing tobacco. If you need help quitting, ask your health care provider.  Do not use street drugs.  Do not share needles.  Ask your health care provider for help if you need support or information about quitting drugs. Alcohol use  Do not drink alcohol if your health care provider tells you not to drink.  If you drink alcohol: ? Limit how much you have to 0-2 drinks a day. ? Be aware of how much alcohol is in your drink. In the [place] one drink equals one 12 oz bottle of beer (355 mL), one 5 oz glass of wine (148 mL), or one 1 oz glass of hard liquor (44 mL). General instructions  Schedule regular health, dental, and eye exams.  Stay current with your vaccines.  Tell your health care provider if: ? You often feel depressed. ? You have ever been abused or do not feel safe at home. Summary  Adopting a healthy lifestyle and getting preventive care are important in promoting health and wellness.  Follow your health care provider's instructions about healthy diet, exercising, and getting tested or screened for diseases.  Follow your health care provider's instructions on monitoring your cholesterol and blood pressure. This information is not intended to replace advice given to you by your health care  provider. Make sure you discuss any questions you have with your health care provider. Document Revised: 09/02/2018  Document Reviewed: 09/02/2018 Elsevier Patient Education  2020 [employer]vier Inc.      Agustina Caroli, MD [facility]

## 2019-11-18 NOTE — Patient Instructions (Addendum)
   If you have lab work done today you will be contacted with your lab results within the next 2 weeks.  If you have not heard from us then please contact us. The fastest way to get your results is to register for My Chart.   IF you received an x-ray today, you will receive an invoice from [facility]. Please contact [facility] at [phone] with questions or concerns regarding your invoice.   IF you received labwork today, you will receive an invoice from [facility]. Please contact [facility] at [phone] with questions or concerns regarding your invoice.   Our billing staff will not be able to assist you with questions regarding bills from these companies.  You will be contacted with the lab results as soon as they are available. The fastest way to get your results is to activate your My Chart account. Instructions are located on the last page of this paperwork. If you have not heard from us regarding the results in 2 weeks, please contact this office.      Health Maintenance, Male Adopting a healthy lifestyle and getting preventive care are important in promoting health and wellness. Ask your health care provider about:  The right schedule for you to have regular tests and exams.  Things you can do on your own to prevent diseases and keep yourself healthy. What should I know about diet, weight, and exercise? Eat a healthy diet   Eat a diet that includes plenty of vegetables, fruits, low-fat dairy products, and lean protein.  Do not eat a lot of foods that are high in solid fats, added sugars, or sodium. Maintain a healthy weight Body mass index (BMI) is a measurement that can be used to identify possible weight problems. It estimates body fat based on height and weight. Your health care provider can help determine your BMI and help you achieve or maintain a healthy weight. Get regular exercise Get regular exercise. This is one of the most important things you  can do for your health. Most adults should:  Exercise for at least 150 minutes each week. The exercise should increase your heart rate and make you sweat (moderate-intensity exercise).  Do strengthening exercises at least twice a week. This is in addition to the moderate-intensity exercise.  Spend less time sitting. Even light physical activity can be beneficial. Watch cholesterol and blood lipids Have your blood tested for lipids and cholesterol at 72 years of age, then have this test every 5 years. You may need to have your cholesterol levels checked more often if:  Your lipid or cholesterol levels are high.  You are older than 72 years of age.  You are at high risk for heart disease. What should I know about cancer screening? Many types of cancers can be detected early and may often be prevented. Depending on your health history and family history, you may need to have cancer screening at various ages. This may include screening for:  Colorectal cancer.  Prostate cancer.  Skin cancer.  Lung cancer. What should I know about heart disease, diabetes, and high blood pressure? Blood pressure and heart disease  High blood pressure causes heart disease and increases the risk of stroke. This is more likely to develop in people who have high blood pressure readings, are of African descent, or are overweight.  Talk with your health care provider about your target blood pressure readings.  Have your blood pressure checked: ? Every 3-5 years if you are 18-39   years of age. ? Every year if you are 40 years old or older.  If you are between the ages of 65 and 75 and are a current or former smoker, ask your health care provider if you should have a one-time screening for abdominal aortic aneurysm (AAA). Diabetes Have regular diabetes screenings. This checks your fasting blood sugar level. Have the screening done:  Once every three years after age 45 if you are at a normal weight and have  a low risk for diabetes.  More often and at a younger age if you are overweight or have a high risk for diabetes. What should I know about preventing infection? Hepatitis B If you have a higher risk for hepatitis B, you should be screened for this virus. Talk with your health care provider to find out if you are at risk for hepatitis B infection. Hepatitis C Blood testing is recommended for:  Everyone born from 1945 through 1965.  Anyone with known risk factors for hepatitis C. Sexually transmitted infections (STIs)  You should be screened each year for STIs, including gonorrhea and chlamydia, if: ? You are sexually active and are younger than 72 years of age. ? You are older than 72 years of age and your health care provider tells you that you are at risk for this type of infection. ? Your sexual activity has changed since you were last screened, and you are at increased risk for chlamydia or gonorrhea. Ask your health care provider if you are at risk.  Ask your health care provider about whether you are at high risk for HIV. Your health care provider may recommend a prescription medicine to help prevent HIV infection. If you choose to take medicine to prevent HIV, you should first get tested for HIV. You should then be tested every 3 months for as long as you are taking the medicine. Follow these instructions at home: Lifestyle  Do not use any products that contain nicotine or tobacco, such as cigarettes, e-cigarettes, and chewing tobacco. If you need help quitting, ask your health care provider.  Do not use street drugs.  Do not share needles.  Ask your health care provider for help if you need support or information about quitting drugs. Alcohol use  Do not drink alcohol if your health care provider tells you not to drink.  If you drink alcohol: ? Limit how much you have to 0-2 drinks a day. ? Be aware of how much alcohol is in your drink. In the [place] one drink equals one 12  oz bottle of beer (355 mL), one 5 oz glass of wine (148 mL), or one 1 oz glass of hard liquor (44 mL). General instructions  Schedule regular health, dental, and eye exams.  Stay current with your vaccines.  Tell your health care provider if: ? You often feel depressed. ? You have ever been abused or do not feel safe at home. Summary  Adopting a healthy lifestyle and getting preventive care are important in promoting health and wellness.  Follow your health care provider's instructions about healthy diet, exercising, and getting tested or screened for diseases.  Follow your health care provider's instructions on monitoring your cholesterol and blood pressure. This information is not intended to replace advice given to you by your health care provider. Make sure you discuss any questions you have with your health care provider. Document Revised: 09/02/2018 Document Reviewed: 09/02/2018 Elsevier Patient Education  2020 [employer]  

## 2019-11-19 LAB — COMPREHENSIVE METABOLIC PANEL
ALT: 37 IU/L (ref 0–44)
AST: 33 IU/L (ref 0–40)
Albumin/Globulin Ratio: 1.5 (ref 1.2–2.2)
Albumin: 4.4 g/dL (ref 3.7–4.7)
Alkaline Phosphatase: 59 IU/L (ref 39–117)
BUN/Creatinine Ratio: 24 (ref 10–24)
BUN: 20 mg/dL (ref 8–27)
Bilirubin Total: 0.8 mg/dL (ref 0.0–1.2)
CO2: 21 mmol/L (ref 20–29)
Calcium: 10 mg/dL (ref 8.6–10.2)
Chloride: 103 mmol/L (ref 96–106)
Creatinine, Ser: 0.83 mg/dL (ref 0.76–1.27)
GFR calc Af Amer: 102 mL/min/{1.73_m2} (ref >59)
GFR calc non Af Amer: 89 mL/min/{1.73_m2} (ref >59)
Globulin, Total: 3 g/dL (ref 1.5–4.5)
Glucose: 141 mg/dL — ABNORMAL HIGH (ref 65–99)
Potassium: 4.3 mmol/L (ref 3.5–5.2)
Sodium: 138 mmol/L (ref 134–144)
Total Protein: 7.4 g/dL (ref 6.0–8.5)

## 2019-11-19 LAB — CBC WITH DIFFERENTIAL/PLATELET
Basophils Absolute: 0.1 10*3/uL (ref 0.0–0.2)
Basos: 1 %
EOS (ABSOLUTE): 0.3 10*3/uL (ref 0.0–0.4)
Eos: 4 %
Hematocrit: 39.2 % (ref 37.5–51.0)
Hemoglobin: 13.3 g/dL (ref 13.0–17.7)
Immature Grans (Abs): 0 10*3/uL (ref 0.0–0.1)
Immature Granulocytes: 0 %
Lymphocytes Absolute: 2.5 10*3/uL (ref 0.7–3.1)
Lymphs: 29 %
MCH: 31.2 pg (ref 26.6–33.0)
MCHC: 33.9 g/dL (ref 31.5–35.7)
MCV: 92 fL (ref 79–97)
Monocytes Absolute: 0.6 10*3/uL (ref 0.1–0.9)
Monocytes: 7 %
Neutrophils Absolute: 5 10*3/uL (ref 1.4–7.0)
Neutrophils: 59 %
Platelets: 210 10*3/uL (ref 150–450)
RBC: 4.26 x10E6/uL (ref 4.14–5.80)
RDW: 13 % (ref 11.6–15.4)
WBC: 8.5 10*3/uL (ref 3.4–10.8)

## 2019-11-19 LAB — LIPID PANEL
Chol/HDL Ratio: 2.1 ratio (ref 0.0–5.0)
Cholesterol, Total: 95 mg/dL — ABNORMAL LOW (ref 100–199)
HDL: 45 mg/dL (ref >39)
LDL Chol Calc (NIH): 32 mg/dL (ref 0–99)
Triglycerides: 93 mg/dL (ref 0–149)
VLDL Cholesterol Cal: 18 mg/dL (ref 5–40)

## 2019-11-19 LAB — SAR COV2 SEROLOGY (COVID19)AB(IGG),IA: DiaSorin SARS-CoV-2 Ab, IgG: POSITIVE

## 2019-11-20 ENCOUNTER — Encounter: Payer: Self-pay | Admitting: Emergency Medicine

## 2019-12-07 DIAGNOSIS — H524 Presbyopia: Secondary | ICD-10-CM | POA: Diagnosis not present

## 2019-12-07 DIAGNOSIS — H43813 Vitreous degeneration, bilateral: Secondary | ICD-10-CM | POA: Diagnosis not present

## 2019-12-07 DIAGNOSIS — H26491 Other secondary cataract, right eye: Secondary | ICD-10-CM | POA: Diagnosis not present

## 2019-12-07 DIAGNOSIS — E119 Type 2 diabetes mellitus without complications: Secondary | ICD-10-CM | POA: Diagnosis not present

## 2019-12-07 LAB — HM DIABETES EYE EXAM

## 2019-12-13 ENCOUNTER — Ambulatory Visit (INDEPENDENT_AMBULATORY_CARE_PROVIDER_SITE_OTHER): Payer: Self-pay | Admitting: Emergency Medicine

## 2019-12-13 ENCOUNTER — Other Ambulatory Visit: Payer: Self-pay

## 2019-12-13 ENCOUNTER — Encounter: Payer: Self-pay | Admitting: Emergency Medicine

## 2019-12-13 VITALS — BP 130/76 | HR 79 | Temp 98.4°F | Resp 15 | Ht 69.0 in | Wt 212.0 lb

## 2019-12-13 DIAGNOSIS — Z024 Encounter for examination for driving license: Secondary | ICD-10-CM

## 2019-12-13 DIAGNOSIS — E1165 Type 2 diabetes mellitus with hyperglycemia: Secondary | ICD-10-CM

## 2019-12-13 DIAGNOSIS — G720 Drug-induced myopathy: Secondary | ICD-10-CM

## 2019-12-13 MED ORDER — TRULICITY 0.75 MG/0.5ML ~~LOC~~ SOAJ: 0.7500 mg | SUBCUTANEOUS | 6 refills | Status: AC

## 2019-12-13 NOTE — Progress Notes (Signed)
Diabetes results from last visit discussed with patient. We will start Trulicity [id number] mg weekly. Advised to cut back on glipizide to 5 mg daily instead of twice a day. Advised to monitor blood glucose closely at home. Follow-up as scheduled.

## 2019-12-13 NOTE — Progress Notes (Addendum)
This patient presents for DOT examination for fitness for duty.   Medical History:  1. Head/Brain Injuries, disorders or illnesses no  2. Seizures, epilepsy no  3. Eye disorders or impaired vision (except corrective lenses) no  4. Ear disorders, loss of hearing or balance no  5. Heart disease or heart attack, other cardiovascular condition yes  6. Heart surgery (valve replacement/bypass, angioplasty, pacemaker/defribrillator) no  7. High blood pressure yes  8. High cholesterol yes  9. Chronic cough, shortness of breath or other breathing problems no  10. Lung disease (emphysema, asthma or chronic bronchitis) no  11. Kidney disease, dialysis no  12. Digestive problems  no  13. Diabetes or elevated blood sugar yes                      If yes to #13, Insulin use no  14. Nervious or psychiatric disorders, e.g., severe depression no  15. Fainting or syncope no  16. Dizziness, headaches, numbness, tingling or memory loss no  17. Unexplained weight loss no  18. Stroke, TIA or paralysis no  19. Missing or impaired hand, arm, foot, leg, finger, toe no  20. Spinal injury or disease no  21. Bone, muscles or nerve problems no  22. Blood clots or bleeding bleeding disorders no  23. Cancer no  24. Chronic infection or other chronic diseases no  25. Sleep disorders, pauses in breathing while asleep, daytime sleepiness, loud snoring no  26. Have you ever had a sleep test? no  27.  Have you ever spent a night in the hospital? yes  28. Have you ever had a broken bone? no  29. Have you or or do you use tobacco products? no  30. Regular, frequent alcohol use no  31. Illegal substance use within the past 2 years no  32.  Have you ever failed a drug test or been dependent on an illegal substance? no   Current Medications: Prior to Admission medications   Medication Sig Start Date End Date Taking? Authorizing Provider  Alirocumab (PRALUENT) 150 MG/ML SOAJ Inject 150 mg into the skin every  14 (fourteen) days. 08/11/19  Yes Leonie Man, MD  blood glucose meter kit and supplies Use to test blood sugar daily. Dx: E11.9 10/11/15  Yes Tereasa Coop, PA-C  clopidogrel (PLAVIX) 75 MG tablet Take 1 tablet (75 mg total) by mouth daily. 07/08/19  Yes Cesiah Westley, Ines Bloomer, MD  glipiZIDE (GLUCOTROL) 5 MG tablet Take 1 tablet (5 mg total) by mouth 2 (two) times daily before a meal. 07/08/19 12/13/19 Yes Rockell Faulks, Ines Bloomer, MD  glucose blood [facility] ULTRA) test strip USE TO TEST BLOOD SUGAR ONCE DAILY 07/08/19  Yes Horald Pollen, MD  Lancets MISC Use to test blood sugar daily. Dx code :E11.9 07/08/19  Yes Rosanne Wohlfarth, Ines Bloomer, MD  losartan (COZAAR) 25 MG tablet Take 1 tablet (25 mg total) by mouth daily. 07/08/19  Yes Paige Vanderwoude, Ines Bloomer, MD  metoprolol succinate (TOPROL-XL) 25 MG 24 hr tablet Take 1 tablet (25 mg total) by mouth daily. 07/08/19  Yes Jerita Wimbush, Ines Bloomer, MD  Multiple Vitamin (MULTIVITAMIN) tablet Take 1 tablet by mouth daily.   Yes [provider]    Medical Examiner's Comments on Health History:  Well-controlled chronic medical conditions.  TESTING:   Hearing Screening   125Hz 250Hz 500Hz 1000Hz 2000Hz 3000Hz 4000Hz 6000Hz 8000Hz  Right ear:           Left ear:  Comments: The patient was able to hear a forced whisper from 6 feet.    Visual Acuity Screening   Right eye Left eye Both eyes  Without correction: 20/30-1 20/30-1 20/30  With correction:     Comments: The patient can distinguish the colors red, amber and green.   Peripheral Vision: Right eye 70 degrees. Left eye 70 degrees.    Monocular Vision: No.  Hearing Aid used for test: No. Hearing Aid required to to meet standard: No.  BP (!) 150/73   Pulse 79   Temp 98.4 F (36.9 C) (Temporal)   Resp 15   Ht _0  (1.753 m)   Wt 212 lb (96.2 kg)   SpO2 92%   BMI 31.31 kg/m Repeat blood pressure 130/70 Pulse rate is regular  Comments: SG: 1.030               Blood: Neg         Sugar: 1000       Protein:Neg  PHYSICAL EXAMINATION:  General Appearance Not markedly obese. No tremor, signs of alcoholism, problem drinking or drug abuse.   Skin Warm, dry and intact.   Eyes Pupils are equal, round and reactive to light and accommodation, extraocular movements are intact. No exophthalmos, no nystagmus.   Ears Normal external ears. External canal without occlusion. No scarring of the TM. No perforation of the TM.  Mouth and Throat Clear and moist. No irremedial deformities likely to interfere with breathing or swallowing.  Heart No murmurs, extra sounds, evidence of cardiomegaly. No pacemaker. No implantable defibrillator.  Lungs and Chest (excluding breasts) Normal chest expansion, respiratory rate, breath sounds. No cyanosis.  Abdomen and Viscera No liver enlargement. No splenic enlargement. No masses, bruits, hernias or significant abdominal wall weakness.  Genitourinary  No inguinal or femoral hernia.  Spine and other musculoskeletal No tenderness, no limitation of motion, no deformities. No evidence of previous surgery.  Extremities No loss or impairment of leg, foot, toe, arm, hand, finger. No perceptible limp, deformities, atrophy, weakness, paralysis, clubbing, edema, hypotonia. Patient has sufficient grasp and prehension to maintain steering wheel grip. Patient has sufficient mobility and strength in the lower limbs to operate pedals properly.  Neurologic Normal equilibrium, coordination, speech pattern. No paresthesia, asymmetry of deep tendon reflexes, sensory or positional abnormalities. No abnormality of patellar or Babinski's reflexes.  Gait Not antalgic or ataxic  Vascular Normal pulses. No carotid or arterial bruits. No varicose veins.    Certification Status:  Meets standards, but periodic monitoring required due to: Hypertension and diabetes  Driver qualified only for: 1 year    Certification expires 12/12/2020

## 2019-12-13 NOTE — Patient Instructions (Addendum)
   If you have lab work done today you will be contacted with your lab results within the next 2 weeks.  If you have not heard from us then please contact us. The fastest way to get your results is to register for My Chart.   IF you received an x-ray today, you will receive an invoice from [facility]. Please contact [facility] at [phone] with questions or concerns regarding your invoice.   IF you received labwork today, you will receive an invoice from [facility]. Please contact [facility] at [phone] with questions or concerns regarding your invoice.   Our billing staff will not be able to assist you with questions regarding bills from these companies.  You will be contacted with the lab results as soon as they are available. The fastest way to get your results is to activate your My Chart account. Instructions are located on the last page of this paperwork. If you have not heard from us regarding the results in 2 weeks, please contact this office.     Diabetes Mellitus and Nutrition, Adult When you have diabetes (diabetes mellitus), it is very important to have healthy eating habits because your blood sugar (glucose) levels are greatly affected by what you eat and drink. Eating healthy foods in the appropriate amounts, at about the same times every day, can help you:  Control your blood glucose.  Lower your risk of heart disease.  Improve your blood pressure.  Reach or maintain a healthy weight. Every person with diabetes is different, and each person has different needs for a meal plan. Your health care provider may recommend that you work with a diet and nutrition specialist (dietitian) to make a meal plan that is best for you. Your meal plan may vary depending on factors such as:  The calories you need.  The medicines you take.  Your weight.  Your blood glucose, blood pressure, and cholesterol levels.  Your activity level.  Other health conditions  you have, such as heart or kidney disease. How do carbohydrates affect me? Carbohydrates, also called carbs, affect your blood glucose level more than any other type of food. Eating carbs naturally raises the amount of glucose in your blood. Carb counting is a method for keeping track of how many carbs you eat. Counting carbs is important to keep your blood glucose at a healthy level, especially if you use insulin or take certain oral diabetes medicines. It is important to know how many carbs you can safely have in each meal. This is different for every person. Your dietitian can help you calculate how many carbs you should have at each meal and for each snack. Foods that contain carbs include:  Bread, cereal, rice, pasta, and crackers.  Potatoes and corn.  Peas, beans, and lentils.  Milk and yogurt.  Fruit and juice.  Desserts, such as cakes, cookies, ice cream, and candy. How does alcohol affect me? Alcohol can cause a sudden decrease in blood glucose (hypoglycemia), especially if you use insulin or take certain oral diabetes medicines. Hypoglycemia can be a life-threatening condition. Symptoms of hypoglycemia (sleepiness, dizziness, and confusion) are similar to symptoms of having too much alcohol. If your health care provider says that alcohol is safe for you, follow these guidelines:  Limit alcohol intake to no more than 1 drink per day for nonpregnant women and 2 drinks per day for men. One drink equals 12 oz of beer, 5 oz of wine, or 1 oz of hard liquor.    Do not drink on an empty stomach.  Keep yourself hydrated with water, diet soda, or unsweetened iced tea.  Keep in mind that regular soda, juice, and other mixers may contain a lot of sugar and must be counted as carbs. What are tips for following this plan?  Reading food labels  Start by checking the serving size on the "Nutrition Facts" label of packaged foods and drinks. The amount of calories, carbs, fats, and other  nutrients listed on the label is based on one serving of the item. Many items contain more than one serving per package.  Check the total grams (g) of carbs in one serving. You can calculate the number of servings of carbs in one serving by dividing the total carbs by 15. For example, if a food has 30 g of total carbs, it would be equal to 2 servings of carbs.  Check the number of grams (g) of saturated and trans fats in one serving. Choose foods that have low or no amount of these fats.  Check the number of milligrams (mg) of salt (sodium) in one serving. Most people should limit total sodium intake to less than 2,300 mg per day.  Always check the nutrition information of foods labeled as "low-fat" or "nonfat". These foods may be higher in added sugar or refined carbs and should be avoided.  Talk to your dietitian to identify your daily goals for nutrients listed on the label. Shopping  Avoid buying canned, premade, or processed foods. These foods tend to be high in fat, sodium, and added sugar.  Shop around the outside edge of the grocery store. This includes fresh fruits and vegetables, bulk grains, fresh meats, and fresh dairy. Cooking  Use low-heat cooking methods, such as baking, instead of high-heat cooking methods like deep frying.  Cook using healthy oils, such as olive, canola, or sunflower oil.  Avoid cooking with butter, cream, or high-fat meats. Meal planning  Eat meals and snacks regularly, preferably at the same times every day. Avoid going long periods of time without eating.  Eat foods high in fiber, such as fresh fruits, vegetables, beans, and whole grains. Talk to your dietitian about how many servings of carbs you can eat at each meal.  Eat 4-6 ounces (oz) of lean protein each day, such as lean meat, chicken, fish, eggs, or tofu. One oz of lean protein is equal to: ? 1 oz of meat, chicken, or fish. ? 1 egg. ?  cup of tofu.  Eat some foods each day that contain  healthy fats, such as avocado, nuts, seeds, and fish. Lifestyle  Check your blood glucose regularly.  Exercise regularly as told by your health care provider. This may include: ? 150 minutes of moderate-intensity or vigorous-intensity exercise each week. This could be brisk walking, biking, or water aerobics. ? Stretching and doing strength exercises, such as yoga or weightlifting, at least 2 times a week.  Take medicines as told by your health care provider.  Do not use any products that contain nicotine or tobacco, such as cigarettes and e-cigarettes. If you need help quitting, ask your health care provider.  Work with a counselor or diabetes educator to identify strategies to manage stress and any emotional and social challenges. Questions to ask a health care provider  Do I need to meet with a diabetes educator?  Do I need to meet with a dietitian?  What number can I call if I have questions?  When are the best times to   check my blood glucose? Where to find more information:  American Diabetes Association: diabetes.org  Academy of Nutrition and Dietetics: www.eatright.org  National Institute of Diabetes and Digestive and Kidney Diseases (NIH): www.niddk.nih.gov Summary  A healthy meal plan will help you control your blood glucose and maintain a healthy lifestyle.  Working with a diet and nutrition specialist (dietitian) can help you make a meal plan that is best for you.  Keep in mind that carbohydrates (carbs) and alcohol have immediate effects on your blood glucose levels. It is important to count carbs and to use alcohol carefully. This information is not intended to replace advice given to you by your health care provider. Make sure you discuss any questions you have with your health care provider. Document Revised: 08/22/2017 Document Reviewed: 10/14/2016 Elsevier Patient Education  2020 Elsevier Inc.  Hypertension, Adult High blood pressure (hypertension) is when  the force of blood pumping through the arteries is too strong. The arteries are the blood vessels that carry blood from the heart throughout the body. Hypertension forces the heart to work harder to pump blood and may cause arteries to become narrow or stiff. Untreated or uncontrolled hypertension can cause a heart attack, heart failure, a stroke, kidney disease, and other problems. A blood pressure reading consists of a higher number over a lower number. Ideally, your blood pressure should be below 120/80. The first ("top") number is called the systolic pressure. It is a measure of the pressure in your arteries as your heart beats. The second ("bottom") number is called the diastolic pressure. It is a measure of the pressure in your arteries as the heart relaxes. What are the causes? The exact cause of this condition is not known. There are some conditions that result in or are related to high blood pressure. What increases the risk? Some risk factors for high blood pressure are under your control. The following factors may make you more likely to develop this condition:  Smoking.  Having type 2 diabetes mellitus, high cholesterol, or both.  Not getting enough exercise or physical activity.  Being overweight.  Having too much fat, sugar, calories, or salt (sodium) in your diet.  Drinking too much alcohol. Some risk factors for high blood pressure may be difficult or impossible to change. Some of these factors include:  Having chronic kidney disease.  Having a family history of high blood pressure.  Age. Risk increases with age.  Race. You may be at higher risk if you are African American.  Gender. Men are at higher risk than women before age 45. After age 65, women are at higher risk than men.  Having obstructive sleep apnea.  Stress. What are the signs or symptoms? High blood pressure may not cause symptoms. Very high blood pressure (hypertensive crisis) may  cause:  Headache.  Anxiety.  Shortness of breath.  Nosebleed.  Nausea and vomiting.  Vision changes.  Severe chest pain.  Seizures. How is this diagnosed? This condition is diagnosed by measuring your blood pressure while you are seated, with your arm resting on a flat surface, your legs uncrossed, and your feet flat on the floor. The cuff of the blood pressure monitor will be placed directly against the skin of your upper arm at the level of your heart. It should be measured at least twice using the same arm. Certain conditions can cause a difference in blood pressure between your right and left arms. Certain factors can cause blood pressure readings to be lower or higher   than normal for a short period of time:  When your blood pressure is higher when you are in a health care provider's office than when you are at home, this is called white coat hypertension. Most people with this condition do not need medicines.  When your blood pressure is higher at home than when you are in a health care provider's office, this is called masked hypertension. Most people with this condition may need medicines to control blood pressure. If you have a high blood pressure reading during one visit or you have normal blood pressure with other risk factors, you may be asked to:  Return on a different day to have your blood pressure checked again.  Monitor your blood pressure at home for 1 week or longer. If you are diagnosed with hypertension, you may have other blood or imaging tests to help your health care provider understand your overall risk for other conditions. How is this treated? This condition is treated by making healthy lifestyle changes, such as eating healthy foods, exercising more, and reducing your alcohol intake. Your health care provider may prescribe medicine if lifestyle changes are not enough to get your blood pressure under control, and if:  Your systolic blood pressure is above  130.  Your diastolic blood pressure is above 80. Your personal target blood pressure may vary depending on your medical conditions, your age, and other factors. Follow these instructions at home: Eating and drinking   Eat a diet that is high in fiber and potassium, and low in sodium, added sugar, and fat. An example eating plan is called the DASH (Dietary Approaches to Stop Hypertension) diet. To eat this way: ? Eat plenty of fresh fruits and vegetables. Try to fill one half of your plate at each meal with fruits and vegetables. ? Eat whole grains, such as whole-wheat pasta, brown rice, or whole-grain bread. Fill about one fourth of your plate with whole grains. ? Eat or drink low-fat dairy products, such as skim milk or low-fat yogurt. ? Avoid fatty cuts of meat, processed or cured meats, and poultry with skin. Fill about one fourth of your plate with lean proteins, such as fish, chicken without skin, beans, eggs, or tofu. ? Avoid pre-made and processed foods. These tend to be higher in sodium, added sugar, and fat.  Reduce your daily sodium intake. Most people with hypertension should eat less than 1,500 mg of sodium a day.  Do not drink alcohol if: ? Your health care provider tells you not to drink. ? You are pregnant, may be pregnant, or are planning to become pregnant.  If you drink alcohol: ? Limit how much you use to:  0-1 drink a day for women.  0-2 drinks a day for men. ? Be aware of how much alcohol is in your drink. In the [place] one drink equals one 12 oz bottle of beer (355 mL), one 5 oz glass of wine (148 mL), or one 1 oz glass of hard liquor (44 mL). Lifestyle   Work with your health care provider to maintain a healthy body weight or to lose weight. Ask what an ideal weight is for you.  Get at least 30 minutes of exercise most days of the week. Activities may include walking, swimming, or biking.  Include exercise to strengthen your muscles (resistance exercise),  such as Pilates or lifting weights, as part of your weekly exercise routine. Try to do these types of exercises for 30 minutes at least 3 days a   week.  Do not use any products that contain nicotine or tobacco, such as cigarettes, e-cigarettes, and chewing tobacco. If you need help quitting, ask your health care provider.  Monitor your blood pressure at home as told by your health care provider.  Keep all follow-up visits as told by your health care provider. This is important. Medicines  Take over-the-counter and prescription medicines only as told by your health care provider. Follow directions carefully. Blood pressure medicines must be taken as prescribed.  Do not skip doses of blood pressure medicine. Doing this puts you at risk for problems and can make the medicine less effective.  Ask your health care provider about side effects or reactions to medicines that you should watch for. Contact a health care provider if you:  Think you are having a reaction to a medicine you are taking.  Have headaches that keep coming back (recurring).  Feel dizzy.  Have swelling in your ankles.  Have trouble with your vision. Get help right away if you:  Develop a severe headache or confusion.  Have unusual weakness or numbness.  Feel faint.  Have severe pain in your chest or abdomen.  Vomit repeatedly.  Have trouble breathing. Summary  Hypertension is when the force of blood pumping through your arteries is too strong. If this condition is not controlled, it may put you at risk for serious complications.  Your personal target blood pressure may vary depending on your medical conditions, your age, and other factors. For most people, a normal blood pressure is less than 120/80.  Hypertension is treated with lifestyle changes, medicines, or a combination of both. Lifestyle changes include losing weight, eating a healthy, low-sodium diet, exercising more, and limiting alcohol. This  information is not intended to replace advice given to you by your health care provider. Make sure you discuss any questions you have with your health care provider. Document Revised: 05/20/2018 Document Reviewed: 05/20/2018 Elsevier Patient Education  2020 Elsevier Inc.  

## 2019-12-14 ENCOUNTER — Encounter: Payer: Self-pay | Admitting: *Deleted

## 2020-01-05 ENCOUNTER — Ambulatory Visit: Payer: Medicare Other | Admitting: Emergency Medicine

## 2020-01-06 ENCOUNTER — Ambulatory Visit: Payer: Medicare Other | Admitting: Emergency Medicine

## 2020-01-21 ENCOUNTER — Ambulatory Visit (INDEPENDENT_AMBULATORY_CARE_PROVIDER_SITE_OTHER): Payer: Medicare Other | Admitting: Family Medicine

## 2020-01-21 VITALS — BP 130/76 | Ht 69.0 in | Wt 212.0 lb

## 2020-01-21 DIAGNOSIS — Z0000 Encounter for general adult medical examination without abnormal findings: Secondary | ICD-10-CM

## 2020-01-21 NOTE — Patient Instructions (Addendum)
Thank you for taking time to come for your Medicare Wellness Visit. I appreciate your ongoing commitment to your health goals. Please review the following plan we discussed and let me know if I can assist you in the future.  Leroy Kennedy LPN  Preventive Care 72 Years and Older, Male Preventive care refers to lifestyle choices and visits with your health care provider that can promote health and wellness. This includes:  A yearly physical exam. This is also called an annual well check.  Regular dental and eye exams.  Immunizations.  Screening for certain conditions.  Healthy lifestyle choices, such as diet and exercise. What can I expect for my preventive care visit? Physical exam Your health care provider will check:  Height and weight. These may be used to calculate body mass index (BMI), which is a measurement that tells if you are at a healthy weight.  Heart rate and blood pressure.  Your skin for abnormal spots. Counseling Your health care provider may ask you questions about:  Alcohol, tobacco, and drug use.  Emotional well-being.  Home and relationship well-being.  Sexual activity.  Eating habits.  History of falls.  Memory and ability to understand (cognition).  Work and work Statistician. What immunizations do I need?  Influenza (flu) vaccine  This is recommended every year. Tetanus, diphtheria, and pertussis (Tdap) vaccine  You may need a Td booster every 10 years. Varicella (chickenpox) vaccine  You may need this vaccine if you have not already been vaccinated. Zoster (shingles) vaccine  You may need this after age 66. Pneumococcal conjugate (PCV13) vaccine  One dose is recommended after age 84. Pneumococcal polysaccharide (PPSV23) vaccine  One dose is recommended after age 88. Measles, mumps, and rubella (MMR) vaccine  You may need at least one dose of MMR if you were born in 1957 or later. You may also need a second dose. Meningococcal  conjugate (MenACWY) vaccine  You may need this if you have certain conditions. Hepatitis A vaccine  You may need this if you have certain conditions or if you travel or work in places where you may be exposed to hepatitis A. Hepatitis B vaccine  You may need this if you have certain conditions or if you travel or work in places where you may be exposed to hepatitis B. Haemophilus influenzae type b (Hib) vaccine  You may need this if you have certain conditions. You may receive vaccines as individual doses or as more than one vaccine together in one shot (combination vaccines). Talk with your health care provider about the risks and benefits of combination vaccines. What tests do I need? Blood tests  Lipid and cholesterol levels. These may be checked every 5 years, or more frequently depending on your overall health.  Hepatitis C test.  Hepatitis B test. Screening  Lung cancer screening. You may have this screening every year starting at age 24 if you have a 30-pack-year history of smoking and currently smoke or have quit within the past 15 years.  Colorectal cancer screening. All adults should have this screening starting at age 52 and continuing until age 61. Your health care provider may recommend screening at age 57 if you are at increased risk. You will have tests every 1-10 years, depending on your results and the type of screening test.  Prostate cancer screening. Recommendations will vary depending on your family history and other risks.  Diabetes screening. This is done by checking your blood sugar (glucose) after you have not eaten for  a while (fasting). You may have this done every 1-3 years.  Abdominal aortic aneurysm (AAA) screening. You may need this if you are a current or former smoker.  Sexually transmitted disease (STD) testing. Follow these instructions at home: Eating and drinking  Eat a diet that includes fresh fruits and vegetables, whole grains, lean  protein, and low-fat dairy products. Limit your intake of foods with high amounts of sugar, saturated fats, and salt.  Take vitamin and mineral supplements as recommended by your health care provider.  Do not drink alcohol if your health care provider tells you not to drink.  If you drink alcohol: ? Limit how much you have to 0-2 drinks a day. ? Be aware of how much alcohol is in your drink. In the [place] one drink equals one 12 oz bottle of beer (355 mL), one 5 oz glass of wine (148 mL), or one 1 oz glass of hard liquor (44 mL). Lifestyle  Take daily care of your teeth and gums.  Stay active. Exercise for at least 30 minutes on 5 or more days each week.  Do not use any products that contain nicotine or tobacco, such as cigarettes, e-cigarettes, and chewing tobacco. If you need help quitting, ask your health care provider.  If you are sexually active, practice safe sex. Use a condom or other form of protection to prevent STIs (sexually transmitted infections).  Talk with your health care provider about taking a low-dose aspirin or statin. What's next?  Visit your health care provider once a year for a well check visit.  Ask your health care provider how often you should have your eyes and teeth checked.  Stay up to date on all vaccines. This information is not intended to replace advice given to you by your health care provider. Make sure you discuss any questions you have with your health care provider. Document Revised: 09/03/2018 Document Reviewed: 09/03/2018 Elsevier Patient Education  2020 [employer] Inc.  

## 2020-01-21 NOTE — Progress Notes (Signed)
Presents today for [employer] Visit   Date of last exam: 12/13/2019  Interpreter used for this visit? No  I connected with  Gregory Mathews. on 48/[birth year] telephone application and verified that I am speaking with the correct person using two identifiers.   I discussed the limitations of evaluation and management by telemedicine. The patient expressed understanding and agreed to proceed.   Patient Care Team: Horald Pollen, MD as PCP - General (Internal Medicine)   Other items to address today:  Discussed Eye/Dental Discussed Immunizations    Other Screening: Last screening for diabetes:11/18/2019 Last lipid screening: 11-18-2019  ADVANCE DIRECTIVES: Discussed yes On File: no Materials Provided: yes  Immunization status:  Immunization History  Administered Date(s) Administered  . Fluad Quad(high Dose 65+) 07/08/2019  . Influenza Split 07/09/2012  . Influenza, High Dose Seasonal PF 06/12/2018  . Influenza,inj,Quad PF,6+ Mos 07/20/2013, 07/19/2014, 08/01/2015, 08/14/2016, 09/10/2017  . Influenza,trivalent, recombinat, inj, PF 06/13/2011  . Pneumococcal Conjugate-13 08/22/2014  . Pneumococcal Polysaccharide-23 07/09/2012, 03/11/2018  . Tdap 06/13/2011, 08/01/2015  . Zoster 09/23/2005, 02/10/2012     Health Maintenance Due  Topic Date Due  . COVID-19 Vaccine (1) Never done     Functional Status Survey: Is the patient deaf or have difficulty hearing?: No Does the patient have difficulty seeing, even when wearing glasses/contacts?: No Does the patient have difficulty concentrating, remembering, or making decisions?: No Does the patient have difficulty walking or climbing stairs?: No Does the patient have difficulty dressing or bathing?: No Does the patient have difficulty doing errands alone such as visiting a doctor's office or shopping?: No   6CIT Screen 01/21/2020  What Year? 0 points  What month? 0 points  What time? 0 points   Count back from 20 0 points  Months in reverse 0 points  Repeat phrase 0 points  Total Score 0        Clinical Support from 01/21/2020 in [facility]  AUDIT-C Score  0       Home Environment:   Lives in one story home No trouble climbing stairs No scattered rugs No grab bars Adequate lighting/no clutter   Patient Active Problem List   Diagnosis Date Noted  . Skin cancer, basal cell 11/18/2019  . Uncircumcised male 03/11/2018  . Coronary artery disease involving native coronary artery of native heart without angina pectoris 11/04/2017  . Abnormal findings on diagnostic imaging of cardiovascular system 11/04/2017  . Agatston coronary artery calcium score between 100 and 199 09/22/2017  . Family history of early CAD 09/11/2017  . Mild aortic stenosis by prior echocardiogram 09/11/2017  . Metabolic syndrome 53/74/8270  . 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 BMI 31.0-31.9,adult 03/13/2016  . Hypertension associated with diabetes ([place]) 08/29/2015  . Diverticulosis of colon without hemorrhage 08/01/2015  . Statin intolerance 08/01/2015  . Basal cell carcinoma of nose 08/23/2014  . Rosacea 08/23/2014  . Seasonal allergies 01/26/2013  . Type 2 diabetes mellitus with hyperglycemia, without long-term current use of insulin ([place]) 07/09/2012  . Hyperlipidemia associated with type 2 diabetes mellitus ([place]) 07/09/2012  . Erectile dysfunction 07/09/2012     Past Medical History:  Diagnosis Date  . Allergy   . Basal cell carcinoma 01/2013   Nasal and R forearm.  [facility]  . CAD S/P percutaneous coronary angioplasty    2/19 PCI/DESx1 to mLAD, FFR 0.7 -Sierra DES 2.75 mm x 18 mm  . Cataract   . Diabetes mellitus without complication ([place])  on PO Meds  . Essential hypertension   . Heart murmur   . Hyperlipidemia    statin intolerant -- myalgias, fatigue (tried at least 4)  . Rosacea      Past Surgical History:  Procedure Laterality Date  . basal  cell removal     nose and wrist   . BELPHAROPTOSIS REPAIR    . CORONARY CT ANGIOGRAM  09/2017   Coronary Calcium Score: 111. Coronary calcification noted in the LEFT MAIN (LM) and prox LAD.  LM < 50% calcified stenosis.  Mid LAD 50-75% plaque noted CT FFR --> positive at 0.80.  Recommend CARDIAC CATHETERIZATION  . CORONARY STENT INTERVENTION N/A 11/12/2017   Procedure: CORONARY STENT INTERVENTION;  Surgeon: Leonie Man, MD;  Location: [place] [facility];  Service: Cardiovascular:  DES PCI:  STENT SIERRA 2.75 X 18 MM - Post intervention, there is a 0% residual stenosis.  Marland Kitchen ETT/GXT: Graded Exercise Tolerance Test  08/2015    Exercised for 9:01 min --> blunted blood pressure response no EKG changes.  No chest pain.  Low Risk  . EYE SURGERY     Cataract surgery  . eyelid surgery    . INTRAVASCULAR PRESSURE WIRE/FFR STUDY N/A 11/12/2017   Procedure: INTRAVASCULAR PRESSURE WIRE/FFR STUDY;  Surgeon: Leonie Man, MD;  Location: [place] [facility];  Service: Cardiovascular;  Laterality: LAD -FFR 0.76  . LEFT HEART CATH AND CORONARY ANGIOGRAPHY N/A 11/12/2017   Procedure: LEFT HEART CATH AND CORONARY ANGIOGRAPHY;  Surgeon: Leonie Man, MD;  Location: [facility]: 70% P-M LAD (FFR 0.76).  40% mid LAD, 50% ostial ramus.  EF 55-60%.  Mildly elevated LVEDP.  Marland Kitchen SHOULDER OPEN ROTATOR CUFF REPAIR  1983  . TRANSTHORACIC ECHOCARDIOGRAM  08/2017   EF 55-60%.  Mild aortic stenosis (mean gradient 11 mmH.)  GR 1 DD.  Mild LA dilation.     Family History  Problem Relation Age of Onset  . Leukemia Mother   . Stroke Father 3       as a complication of CABG  . Colon cancer Father 15  . Cancer Father 26       Colon cancer  . Coronary artery disease Father 18       - Sx was DOE - MV CAD  . Peripheral Artery Disease Father        presumptive  . Diabetes Sister   . Sudden Cardiac Death Sister 3       presumed MI  . Heart attack Brother 81       During throat surgery  . Congestive Heart  Failure Brother        Died @ 36 - ? CHF / ICM  . Cancer Maternal Grandmother   . Stroke Maternal Grandfather   . Diabetes Brother 64       on lots of meds  . Diabetes Brother   . Cancer Brother        colon cancer  . Rheumatic fever Brother        childhood RF --> Valvular Dz - valve Sgx,  died young     Social History   Socioeconomic History  . Marital status: Married    Spouse name: [name]  . Number of children: 4  . Years of education: 82  . Highest education level: Not on file  Occupational History  . Occupation: bus Engineer, manufacturing systems: [employer]  Tobacco Use  . Smoking status: Former Smoker  Packs/day: 1.00    Years: 15.00    Pack years: 15.00    Quit date: 07/09/1982    Years since quitting: 37.5  . Smokeless tobacco: Never Used  Substance and Sexual Activity  . Alcohol use: No  . Drug use: No  . Sexual activity: Yes    Partners: Female  Other Topics Concern  . Not on file  Social History Narrative   Marital status: married x 33 years; happily married.   Lives: with wife       Children:  4 children; 4 grandchildren. Adult children all live locally.          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      Tobacco:  In past; smoked x 15 years; quit 35 years ago.      Alcohol: none      ADLs: independent with all ADLs.  Does not use assistant device with ambulation.      Living Will:  Has living will; desires FULL CODE; no prolonged measures.      Doctoral degree in Biblical Studies.          Diet: eats twice daily, eggs,sausage,and fruits for breakfast; chicken (fried or broil), occassional pizza, salads, steak and pork chops.       Exercise: cardiac rehab, transition to [facility] - 45 minutes walks and resistance 5x/week   Social Determinants of Health   Financial Resource Strain:   . Difficulty of Paying Living Expenses:   Food Insecurity:   . Worried About  Charity fundraiser in the Last Year:   . Arboriculturist in the Last Year:   Transportation Needs:   . Film/video editor (Medical):   Marland Kitchen Lack of Transportation (Non-Medical):   Physical Activity:   . Days of Exercise per Week:   . Minutes of Exercise per Session:   Stress:   . Feeling of Stress :   Social Connections:   . Frequency of Communication with Friends and Family:   . Frequency of Social Gatherings with Friends and Family:   . Attends Religious Services:   . Active Member of Clubs or Organizations:   . Attends Archivist Meetings:   Marland Kitchen Marital Status:   Intimate Partner Violence:   . Fear of Current or Ex-Partner:   . Emotionally Abused:   Marland Kitchen Physically Abused:   . Sexually Abused:      Allergies  Allergen Reactions  . Januvia [Sitagliptin] Other (See Comments)    Gi upset and kidney problems  . Lisinopril Cough  . Statins Other (See Comments)    Severe leg pain & fatigue -- tried at least 4 (even low dose)     Prior to Admission medications   Medication Sig Start Date End Date Taking? Authorizing Provider  Alirocumab (PRALUENT) 150 MG/ML SOAJ Inject 150 mg into the skin every 14 (fourteen) days. 08/11/19  Yes Leonie Man, MD  blood glucose meter kit and supplies Use to test blood sugar daily. Dx: E11.9 10/11/15  Yes Tereasa Coop, PA-C  clopidogrel (PLAVIX) 75 MG tablet Take 1 tablet (75 mg total) by mouth daily. 07/08/19  Yes Sagardia, Ines Bloomer, MD  glipiZIDE (GLUCOTROL) 5 MG tablet Take 1 tablet (5 mg total) by mouth 2 (two) times daily before a meal. 07/08/19 01/21/20 Yes Sagardia, Ines Bloomer, MD  glucose blood [facility] ULTRA) test strip USE TO TEST BLOOD SUGAR ONCE DAILY 07/08/19  Yes  Horald Pollen, MD  Lancets MISC Use to test blood sugar daily. Dx code :E11.9 07/08/19  Yes Sagardia, Ines Bloomer, MD  losartan (COZAAR) 25 MG tablet Take 1 tablet (25 mg total) by mouth daily. 07/08/19  Yes Sagardia, Ines Bloomer, MD  metoprolol  succinate (TOPROL-XL) 25 MG 24 hr tablet Take 1 tablet (25 mg total) by mouth daily. 07/08/19  Yes Sagardia, Ines Bloomer, MD  Multiple Vitamin (MULTIVITAMIN) tablet Take 1 tablet by mouth daily.   Yes [provider]     Depression screen [facility] 2/9 01/21/2020 12/13/2019 11/18/2019 09/21/2019 07/08/2019  Decreased Interest 0 0 0 0 0  Down, Depressed, Hopeless 0 0 0 0 0  PHQ - 2 Score 0 0 0 0 0     Fall Risk  01/21/2020 12/13/2019 11/18/2019 09/21/2019 07/08/2019  Falls in the past year? 0 0 0 0 0  Number falls in past yr: 0 - - - -  Injury with Fall? 0 - - - -  Follow up Falls evaluation completed;Education provided Falls evaluation completed - Falls evaluation completed Falls evaluation completed      PHYSICAL EXAM: BP 130/76 Comment: not in clinic  Ht '5\' 9"'  (1.753 m)   Wt 212 lb (96.2 kg)   BMI 31.31 kg/m    Wt Readings from Last 3 Encounters:  01/21/20 212 lb (96.2 kg)  12/13/19 212 lb (96.2 kg)  11/18/19 211 lb (95.7 kg)      Education/Counseling provided regarding diet and exercise, prevention of chronic diseases, smoking/tobacco cessation, if applicable, and reviewed "Covered Medicare Preventive Services."   ASSESSMENT/PLAN: There are no diagnoses linked to this encounter.

## 2020-01-26 ENCOUNTER — Telehealth: Payer: Self-pay | Admitting: Emergency Medicine

## 2020-01-26 ENCOUNTER — Other Ambulatory Visit: Payer: Self-pay | Admitting: Emergency Medicine

## 2020-01-26 DIAGNOSIS — E1165 Type 2 diabetes mellitus with hyperglycemia: Secondary | ICD-10-CM

## 2020-01-26 MED ORDER — JARDIANCE 10 MG PO TABS: 10.0000 mg | ORAL_TABLET | Freq: Every day | ORAL | 3 refills | Status: AC

## 2020-01-26 NOTE — Telephone Encounter (Signed)
Patient states he is unable to continue taking Trulicity due to side effects. Per patient he would like an alternative. Please Advise.

## 2020-01-26 NOTE — Telephone Encounter (Signed)
Pt stated he is unable to keep taking the Trulicity due to the side effects. Stated he would to try one of the alternatives. Please advise.

## 2020-01-26 NOTE — Telephone Encounter (Signed)
We will switch to Jardiance.  New prescription sent.  Thanks.

## 2020-01-28 NOTE — Telephone Encounter (Signed)
Called and LVM and informed patient that a new prescription was sent to the pharmacy.

## 2020-02-01 NOTE — Telephone Encounter (Signed)
What he should do then is to take glipizide 10 mg with food twice a day.  Thanks.

## 2020-02-01 NOTE — Telephone Encounter (Signed)
Need to know if pt is taking his glipizide once daily or twice daily with meals, so that Dr Mitchel Honour can adjust his meds

## 2020-02-01 NOTE — Telephone Encounter (Signed)
Pt is currently taking 10 mg glipizide twice daily with meals should he continue as is or should there be another change?

## 2020-02-01 NOTE — Telephone Encounter (Signed)
This is the second medication that we try.  What kind of side effects did he get with Jardiance?

## 2020-02-01 NOTE — Telephone Encounter (Signed)
Continue as is.  Thanks.

## 2020-02-01 NOTE — Telephone Encounter (Signed)
Pt called stating that the jardiance he has taken before, but that it has given him side effects as well. Pt is requesting to have an alternative medication be sent in for him. Please advise.

## 2020-02-01 NOTE — Telephone Encounter (Signed)
Pt called back he cannot take Jardiance due to previously history of side effects with this medication. Would be able to send something else

## 2020-02-01 NOTE — Telephone Encounter (Signed)
Pt states he was unable to urinate while taking the Jardiance and had taken it for 2 months previously  Tried Trulicity for 1 month had bad indigestions, stomach aches and cramps, became constipated

## 2020-05-17 ENCOUNTER — Encounter: Payer: Self-pay | Admitting: Emergency Medicine

## 2020-05-17 ENCOUNTER — Other Ambulatory Visit: Payer: Self-pay

## 2020-05-17 ENCOUNTER — Ambulatory Visit (INDEPENDENT_AMBULATORY_CARE_PROVIDER_SITE_OTHER): Payer: Medicare Other | Admitting: Emergency Medicine

## 2020-05-17 VITALS — BP 133/66 | HR 61 | Temp 98.0°F | Ht 69.0 in | Wt 207.8 lb

## 2020-05-17 DIAGNOSIS — E1165 Type 2 diabetes mellitus with hyperglycemia: Secondary | ICD-10-CM | POA: Diagnosis not present

## 2020-05-17 DIAGNOSIS — E1169 Type 2 diabetes mellitus with other specified complication: Secondary | ICD-10-CM

## 2020-05-17 DIAGNOSIS — D689 Coagulation defect, unspecified: Secondary | ICD-10-CM | POA: Diagnosis not present

## 2020-05-17 DIAGNOSIS — I10 Essential (primary) hypertension: Secondary | ICD-10-CM

## 2020-05-17 DIAGNOSIS — E785 Hyperlipidemia, unspecified: Secondary | ICD-10-CM

## 2020-05-17 DIAGNOSIS — I25119 Atherosclerotic heart disease of native coronary artery with unspecified angina pectoris: Secondary | ICD-10-CM | POA: Diagnosis not present

## 2020-05-17 DIAGNOSIS — E1159 Type 2 diabetes mellitus with other circulatory complications: Secondary | ICD-10-CM

## 2020-05-17 DIAGNOSIS — I152 Hypertension secondary to endocrine disorders: Secondary | ICD-10-CM

## 2020-05-17 DIAGNOSIS — Z20822 Contact with and (suspected) exposure to covid-19: Secondary | ICD-10-CM

## 2020-05-17 LAB — POCT GLYCOSYLATED HEMOGLOBIN (HGB A1C): Hemoglobin A1C: 7.4 % — AB (ref 4.0–5.6)

## 2020-05-17 LAB — GLUCOSE, POCT (MANUAL RESULT ENTRY): POC Glucose: 141 mg/dl — AB (ref 70–99)

## 2020-05-17 MED ORDER — EMPAGLIFLOZIN 25 MG PO TABS: 25.0000 mg | ORAL_TABLET | Freq: Every day | ORAL | 3 refills | Status: DC

## 2020-05-17 NOTE — Assessment & Plan Note (Signed)
Well-controlled hypertension.  Continue present medications no changes. Uncontrolled diabetes both improved hemoglobin A1c at 7.4.  Better than before. Continue glipizide 5 mg twice a day and increase Jardiance to 25 mg daily. Diet and nutrition discussed. Follow-up in 6 months.

## 2020-05-17 NOTE — Patient Instructions (Addendum)
If you have lab work done today you will be contacted with your lab results within the next 2 weeks.  If you have not heard from [place] then please contact us. The fastest way to get your results is to register for My Chart.   IF you received an x-ray today, you will receive an invoice from [facility]. Please contact [facility] at [phone] with questions or concerns regarding your invoice.   IF you received labwork today, you will receive an invoice from [facility]. Please contact [facility] at [phone] with questions or concerns regarding your invoice.   Our billing staff will not be able to assist you with questions regarding bills from these companies.  You will be contacted with the lab results as soon as they are available. The fastest way to get your results is to activate your My Chart account. Instructions are located on the last page of this paperwork. If you have not heard from [place] regarding the results in 2 weeks, please contact this office.      Diabetes Mellitus and Nutrition, Adult When you have diabetes (diabetes mellitus), it is very important to have healthy eating habits because your blood sugar (glucose) levels are greatly affected by what you eat and drink. Eating healthy foods in the appropriate amounts, at about the same times every day, can help you:  Control your blood glucose.  Lower your risk of heart disease.  Improve your blood pressure.  Reach or maintain a healthy weight. Every person with diabetes is different, and each person has different needs for a meal plan. Your health care provider may recommend that you work with a diet and nutrition specialist (dietitian) to make a meal plan that is best for you. Your meal plan may vary depending on factors such as:  The calories you need.  The medicines you take.  Your weight.  Your blood glucose, blood pressure, and cholesterol levels.  Your activity level.  Other health  conditions you have, such as heart or kidney disease. How do carbohydrates affect me? Carbohydrates, also called carbs, affect your blood glucose level more than any other type of food. Eating carbs naturally raises the amount of glucose in your blood. Carb counting is a method for keeping track of how many carbs you eat. Counting carbs is important to keep your blood glucose at a healthy level, especially if you use insulin or take certain oral diabetes medicines. It is important to know how many carbs you can safely have in each meal. This is different for every person. Your dietitian can help you calculate how many carbs you should have at each meal and for each snack. Foods that contain carbs include:  Bread, cereal, rice, pasta, and crackers.  Potatoes and corn.  Peas, beans, and lentils.  Milk and yogurt.  Fruit and juice.  Desserts, such as cakes, cookies, ice cream, and candy. How does alcohol affect me? Alcohol can cause a sudden decrease in blood glucose (hypoglycemia), especially if you use insulin or take certain oral diabetes medicines. Hypoglycemia can be a life-threatening condition. Symptoms of hypoglycemia (sleepiness, dizziness, and confusion) are similar to symptoms of having too much alcohol. If your health care provider says that alcohol is safe for you, follow these guidelines:  Limit alcohol intake to no more than 1 drink per day for nonpregnant women and 2 drinks per day for men. One drink equals 12 oz of beer, 5 oz of wine, or 1 oz of hard  liquor.  Do not drink on an empty stomach.  Keep yourself hydrated with water, diet soda, or unsweetened iced tea.  Keep in mind that regular soda, juice, and other mixers may contain a lot of sugar and must be counted as carbs. What are tips for following this plan?  Reading food labels  Start by checking the serving size on the "Nutrition Facts" label of packaged foods and drinks. The amount of calories, carbs, fats, and  other nutrients listed on the label is based on one serving of the item. Many items contain more than one serving per package.  Check the total grams (g) of carbs in one serving. You can calculate the number of servings of carbs in one serving by dividing the total carbs by 15. For example, if a food has 30 g of total carbs, it would be equal to 2 servings of carbs.  Check the number of grams (g) of saturated and trans fats in one serving. Choose foods that have low or no amount of these fats.  Check the number of milligrams (mg) of salt (sodium) in one serving. Most people should limit total sodium intake to less than 2,300 mg per day.  Always check the nutrition information of foods labeled as "low-fat" or "nonfat". These foods may be higher in added sugar or refined carbs and should be avoided.  Talk to your dietitian to identify your daily goals for nutrients listed on the label. Shopping  Avoid buying canned, premade, or processed foods. These foods tend to be high in fat, sodium, and added sugar.  Shop around the outside edge of the grocery store. This includes fresh fruits and vegetables, bulk grains, fresh meats, and fresh dairy. Cooking  Use low-heat cooking methods, such as baking, instead of high-heat cooking methods like deep frying.  Cook using healthy oils, such as olive, canola, or sunflower oil.  Avoid cooking with butter, cream, or high-fat meats. Meal planning  Eat meals and snacks regularly, preferably at the same times every day. Avoid going long periods of time without eating.  Eat foods high in fiber, such as fresh fruits, vegetables, beans, and whole grains. Talk to your dietitian about how many servings of carbs you can eat at each meal.  Eat 4-6 ounces (oz) of lean protein each day, such as lean meat, chicken, fish, eggs, or tofu. One oz of lean protein is equal to: ? 1 oz of meat, chicken, or fish. ? 1 egg. ?  cup of tofu.  Eat some foods each day that  contain healthy fats, such as avocado, nuts, seeds, and fish. Lifestyle  Check your blood glucose regularly.  Exercise regularly as told by your health care provider. This may include: ? 150 minutes of moderate-intensity or vigorous-intensity exercise each week. This could be brisk walking, biking, or water aerobics. ? Stretching and doing strength exercises, such as yoga or weightlifting, at least 2 times a week.  Take medicines as told by your health care provider.  Do not use any products that contain nicotine or tobacco, such as cigarettes and e-cigarettes. If you need help quitting, ask your health care provider.  Work with a Social worker or diabetes educator to identify strategies to manage stress and any emotional and social challenges. Questions to ask a health care provider  Do I need to meet with a diabetes educator?  Do I need to meet with a dietitian?  What number can I call if I have questions?  When are the best  times to check my blood glucose? Where to find more information:  American Diabetes Association: diabetes.org  Academy of Nutrition and Dietetics: www.eatright.[employer] of Diabetes and Digestive and Kidney Diseases (NIH): [employer] Summary  A healthy meal plan will help you control your blood glucose and maintain a healthy lifestyle.  Working with a diet and nutrition specialist (dietitian) can help you make a meal plan that is best for you.  Keep in mind that carbohydrates (carbs) and alcohol have immediate effects on your blood glucose levels. It is important to count carbs and to use alcohol carefully. This information is not intended to replace advice given to you by your health care provider. Make sure you discuss any questions you have with your health care provider. Document Revised: 08/22/2017 Document Reviewed: 10/14/2016 Elsevier Patient Education  2020 [employer].

## 2020-05-17 NOTE — Progress Notes (Signed)
Gregory Mathews. 74 y.o.   Chief Complaint  Patient presents with  . Medical Management of Chronic Issues    79 m f/u     HISTORY OF PRESENT ILLNESS: This is a 74 y.o. male with multiple chronic medical problems including diabetes here for follow-up. Presently taking glipizide 5 mg twice a day and Jardiance 10 mg daily.  Intolerant to Januvia, Metformin, and Trulicity. Intolerant to statins.  On Praluent. Has history of coronary artery disease, dyslipidemia, and hypertension.  Sees cardiologist on a regular basis. Presently on losartan, metoprolol, and Plavix. Doing well.  Has no complaints or medical concerns today. Had Covid infection earlier this year.  Developed natural immunity.  Last June had similar symptoms that lasted 4 to 5 days. No Covid vaccination yet. BP Readings from Last 3 Encounters:  05/17/20 133/66  01/21/20 130/76  12/13/19 130/76   Wt Readings from Last 3 Encounters:  05/17/20 207 lb 12.8 oz (94.3 kg)  01/21/20 212 lb (96.2 kg)  12/13/19 212 lb (96.2 kg)     HPI   Prior to Admission medications   Medication Sig Start Date End Date Taking? Authorizing Provider  Alirocumab (PRALUENT) 150 MG/ML SOAJ Inject 150 mg into the skin every 14 (fourteen) days. 08/11/19  Yes Leonie Man, MD  blood glucose meter kit and supplies Use to test blood sugar daily. Dx: E11.9 10/11/15  Yes Tereasa Coop, PA-C  clopidogrel (PLAVIX) 75 MG tablet Take 1 tablet (75 mg total) by mouth daily. 07/08/19  Yes Kaspian Muccio, Ines Bloomer, MD  glipiZIDE (GLUCOTROL) 5 MG tablet Take 1 tablet (5 mg total) by mouth 2 (two) times daily before a meal. 07/08/19 05/17/20 Yes Prospero Mahnke, Ines Bloomer, MD  glucose blood [facility] ULTRA) test strip USE TO TEST BLOOD SUGAR ONCE DAILY 07/08/19  Yes Horald Pollen, MD  Lancets MISC Use to test blood sugar daily. Dx code :E11.9 07/08/19  Yes Maanasa Aderhold, Ines Bloomer, MD  losartan (COZAAR) 25 MG tablet Take 1 tablet (25 mg total) by mouth daily.  07/08/19  Yes Nimsi Males, Ines Bloomer, MD  metoprolol succinate (TOPROL-XL) 25 MG 24 hr tablet Take 1 tablet (25 mg total) by mouth daily. 07/08/19  Yes Caitlynn Ju, Ines Bloomer, MD  Multiple Vitamin (MULTIVITAMIN) tablet Take 1 tablet by mouth daily.   Yes [provider]    Allergies  Allergen Reactions  . Januvia [Sitagliptin] Other (See Comments)    Gi upset and kidney problems  . Lisinopril Cough  . Statins Other (See Comments)    Severe leg pain & fatigue -- tried at least 4 (even low dose)    Patient Active Problem List   Diagnosis Date Noted  . Clotting disorder ([place]) 05/17/2020  . Skin cancer, basal cell 11/18/2019  . Uncircumcised male 03/11/2018  . Coronary artery disease involving native coronary artery of native heart with angina pectoris ([place]) 11/04/2017  . Abnormal findings on diagnostic imaging of cardiovascular system 11/04/2017  . Agatston coronary artery calcium score between 100 and 199 09/22/2017  . Family history of early CAD 09/11/2017  . Mild aortic stenosis by prior echocardiogram 09/11/2017  . Metabolic syndrome 96/28/3662  . 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 BMI 31.0-31.9,adult 03/13/2016  . Hypertension associated with diabetes ([place]) 08/29/2015  . Diverticulosis of colon without hemorrhage 08/01/2015  . Statin intolerance 08/01/2015  . Basal cell carcinoma of nose 08/23/2014  . Rosacea 08/23/2014  . Seasonal allergies 01/26/2013  . Type 2 diabetes mellitus with hyperglycemia, without long-term current use  of insulin ([place]) 07/09/2012  . Hyperlipidemia associated with type 2 diabetes mellitus ([place]) 07/09/2012  . Erectile dysfunction 07/09/2012    Past Medical History:  Diagnosis Date  . Allergy   . Basal cell carcinoma 01/2013   Nasal and R forearm.  [facility]  . CAD S/P percutaneous coronary angioplasty    2/19 PCI/DESx1 to mLAD, FFR 0.7 -Sierra DES 2.75 mm x 18 mm  . Cataract   . Diabetes mellitus without complication (HCC)      on PO Meds  . Essential hypertension   . Heart murmur   . Hyperlipidemia    statin intolerant -- myalgias, fatigue (tried at least 4)  . Rosacea     Past Surgical History:  Procedure Laterality Date  . basal cell removal     nose and wrist   . BELPHAROPTOSIS REPAIR    . CORONARY CT ANGIOGRAM  09/2017   Coronary Calcium Score: 111. Coronary calcification noted in the LEFT MAIN (LM) and prox LAD.  LM < 50% calcified stenosis.  Mid LAD 50-75% plaque noted CT FFR --> positive at 0.80.  Recommend CARDIAC CATHETERIZATION  . CORONARY STENT INTERVENTION N/A 11/12/2017   Procedure: CORONARY STENT INTERVENTION;  Surgeon: Leonie Man, MD;  Location: [place] [facility];  Service: Cardiovascular:  DES PCI:  STENT SIERRA 2.75 X 18 MM - Post intervention, there is a 0% residual stenosis.  Marland Kitchen ETT/GXT: Graded Exercise Tolerance Test  08/2015    Exercised for 9:01 min --> blunted blood pressure response no EKG changes.  No chest pain.  Low Risk  . EYE SURGERY     Cataract surgery  . eyelid surgery    . INTRAVASCULAR PRESSURE WIRE/FFR STUDY N/A 11/12/2017   Procedure: INTRAVASCULAR PRESSURE WIRE/FFR STUDY;  Surgeon: Leonie Man, MD;  Location: [place] [facility];  Service: Cardiovascular;  Laterality: LAD -FFR 0.76  . LEFT HEART CATH AND CORONARY ANGIOGRAPHY N/A 11/12/2017   Procedure: LEFT HEART CATH AND CORONARY ANGIOGRAPHY;  Surgeon: Leonie Man, MD;  Location: [facility]: 70% P-M LAD (FFR 0.76).  40% mid LAD, 50% ostial ramus.  EF 55-60%.  Mildly elevated LVEDP.  Marland Kitchen SHOULDER OPEN ROTATOR CUFF REPAIR  1983  . TRANSTHORACIC ECHOCARDIOGRAM  08/2017   EF 55-60%.  Mild aortic stenosis (mean gradient 11 mmH.)  GR 1 DD.  Mild LA dilation.    Social History   Socioeconomic History  . Marital status: Married    Spouse name: [name]  . Number of children: 4  . Years of education: 62  . Highest education level: Not on file  Occupational History  . Occupation: bus  Engineer, manufacturing systems: [employer]  Tobacco Use  . Smoking status: Former Smoker    Packs/day: 1.00    Years: 15.00    Pack years: 15.00    Quit date: 07/09/1982    Years since quitting: 37.8  . Smokeless tobacco: Never Used  Substance and Sexual Activity  . Alcohol use: No  . Drug use: No  . Sexual activity: Yes    Partners: Female  Other Topics Concern  . Not on file  Social History Narrative   Marital status: married x 53 years; happily married.   Lives: with wife       Children:  4 children; 4 grandchildren. Adult children all live locally.          Employment: retired from bus transportation; PRN driving now activity bus. [employer]  Chicken farming.      Tobacco:  In past; smoked x 15 years; quit 35 years ago.      Alcohol: none      ADLs: independent with all ADLs.  Does not use assistant device with ambulation.      Living Will:  Has living will; desires FULL CODE; no prolonged measures.      Doctoral degree in Biblical Studies.          Diet: eats twice daily, eggs,sausage,and fruits for breakfast; chicken (fried or broil), occassional pizza, salads, steak and pork chops.       Exercise: cardiac rehab, transition to [facility] - 45 minutes walks and resistance 5x/week   Social Determinants of Health   Financial Resource Strain:   . Difficulty of Paying Living Expenses: Not on file  Food Insecurity:   . Worried About Charity fundraiser in the Last Year: Not on file  . Ran Out of Food in the Last Year: Not on file  Transportation Needs:   . Lack of Transportation (Medical): Not on file  . Lack of Transportation (Non-Medical): Not on file  Physical Activity:   . Days of Exercise per Week: Not on file  . Minutes of Exercise per Session: Not on file  Stress:   . Feeling of Stress : Not on file  Social Connections:   . Frequency of Communication with Friends and Family: Not on file  . Frequency of Social Gatherings with Friends and  Family: Not on file  . Attends Religious Services: Not on file  . Active Member of Clubs or Organizations: Not on file  . Attends Archivist Meetings: Not on file  . Marital Status: Not on file  Intimate Partner Violence:   . Fear of Current or Ex-Partner: Not on file  . Emotionally Abused: Not on file  . Physically Abused: Not on file  . Sexually Abused: Not on file    Family History  Problem Relation Age of Onset  . Leukemia Mother   . Stroke Father 75       as a complication of CABG  . Colon cancer Father 56  . Cancer Father 25       Colon cancer  . Coronary artery disease Father 59       - Sx was DOE - MV CAD  . Peripheral Artery Disease Father        presumptive  . Diabetes Sister   . Sudden Cardiac Death Sister 44       presumed MI  . Heart attack Brother 81       During throat surgery  . Congestive Heart Failure Brother        Died @ 9 - ? CHF / ICM  . Cancer Maternal Grandmother   . Stroke Maternal Grandfather   . Diabetes Brother 64       on lots of meds  . Diabetes Brother   . Cancer Brother        colon cancer  . Rheumatic fever Brother        childhood RF --> Valvular Dz - valve Sgx,  died young     Review of Systems  Constitutional: Negative.  Negative for chills and fever.  HENT: Negative.  Negative for congestion and sore throat.   Respiratory: Negative.  Negative for cough and shortness of breath.   Cardiovascular: Negative.  Negative for chest pain and palpitations.  Gastrointestinal: Negative for abdominal pain, diarrhea, nausea and vomiting.  Genitourinary: Negative.  Negative for dysuria and urgency.  Musculoskeletal: Negative.  Negative for myalgias and neck pain.  Skin: Negative.  Negative for rash.  Neurological: Negative.  Negative for dizziness and headaches.  All other systems reviewed and are negative.   Today's Vitals   05/17/20 0947  BP: 133/66  Pulse: 61  Temp: 98 F (36.7 C)  TempSrc: Temporal  SpO2: 98%   Weight: 207 lb 12.8 oz (94.3 kg)  Height: 5' 9" (1.753 m)   Body mass index is 30.69 kg/m.  Physical Exam Vitals reviewed.  Constitutional:      Appearance: Normal appearance.  HENT:     Head: Normocephalic.  Eyes:     Extraocular Movements: Extraocular movements intact.     Conjunctiva/sclera: Conjunctivae normal.     Pupils: Pupils are equal, round, and reactive to light.  Cardiovascular:     Rate and Rhythm: Normal rate and regular rhythm.     Pulses: Normal pulses.     Heart sounds: Normal heart sounds.  Pulmonary:     Effort: Pulmonary effort is normal.     Breath sounds: Normal breath sounds.  Musculoskeletal:        General: Normal range of motion.     Cervical back: Normal range of motion and neck supple.  Skin:    General: Skin is warm and dry.     Capillary Refill: Capillary refill takes less than 2 seconds.  Neurological:     General: No focal deficit present.     Mental Status: He is alert and oriented to person, place, and time.  Psychiatric:        Mood and Affect: Mood normal.        Behavior: Behavior normal.       Results for orders placed or performed in visit on 05/17/20 (from the past 24 hour(s))  POCT glucose (manual entry)     Status: Abnormal   Collection Time: 05/17/20 10:05 AM  Result Value Ref Range   POC Glucose 141 (A) 70 - 99 mg/dl  POCT glycosylated hemoglobin (Hb A1C)     Status: Abnormal   Collection Time: 05/17/20 10:10 AM  Result Value Ref Range   Hemoglobin A1C 7.4 (A) 4.0 - 5.6 %   HbA1c POC (<> result, manual entry)     HbA1c, POC (prediabetic range)     HbA1c, POC (controlled diabetic range)     A total of 30 minutes was spent with the patient, greater than 50% of which was in counseling/coordination of care regarding multiple chronic medical problems including diabetes, cardiovascular risks associated with this condition, review of most recent blood work results including today's hemoglobin A1c, diet and nutrition, review of  all medications including increasing dose of Jardiance to 25 mg daily, review of most recent office visit notes, prognosis and need for follow-up.  ASSESSMENT & PLAN: Hypertension associated with diabetes ([place]) Well-controlled hypertension.  Continue present medications no changes. Uncontrolled diabetes both improved hemoglobin A1c at 7.4.  Better than before. Continue glipizide 5 mg twice a day and increase Jardiance to 25 mg daily. Diet and nutrition discussed. Follow-up in 6 months.  Ash was seen today for medical management of chronic issues.  Diagnoses and all orders for this visit:  Hypertension associated with diabetes ([place]) -     POCT glucose (manual entry) -     POCT glycosylated hemoglobin (Hb A1C) -     Comprehensive metabolic panel  Clotting disorder (HCC)  Coronary artery disease involving native  coronary artery of native heart with angina pectoris ([place])  Type 2 diabetes mellitus with hyperglycemia, without long-term current use of insulin (HCC) -     empagliflozin (JARDIANCE) 25 MG TABS tablet; Take 1 tablet (25 mg total) by mouth daily before breakfast.  Exposure to COVID-19 virus -     SAR CoV2 Serology (COVID 19)AB(IGG)IA  Hyperlipidemia associated with type 2 diabetes mellitus ([place])    Patient Instructions       If you have lab work done today you will be contacted with your lab results within the next 2 weeks.  If you have not heard from [place] then please contact us. The fastest way to get your results is to register for My Chart.   IF you received an x-ray today, you will receive an invoice from [facility]. Please contact [facility] at [phone] with questions or concerns regarding your invoice.   IF you received labwork today, you will receive an invoice from [place]. Please contact [facility] at [phone] with questions or concerns regarding your invoice.   Our billing staff will not be able to assist you with questions  regarding bills from these companies.  You will be contacted with the lab results as soon as they are available. The fastest way to get your results is to activate your My Chart account. Instructions are located on the last page of this paperwork. If you have not heard from [place] regarding the results in 2 weeks, please contact this office.      Diabetes Mellitus and Nutrition, Adult When you have diabetes (diabetes mellitus), it is very important to have healthy eating habits because your blood sugar (glucose) levels are greatly affected by what you eat and drink. Eating healthy foods in the appropriate amounts, at about the same times every day, can help you:  Control your blood glucose.  Lower your risk of heart disease.  Improve your blood pressure.  Reach or maintain a healthy weight. Every person with diabetes is different, and each person has different needs for a meal plan. Your health care provider may recommend that you work with a diet and nutrition specialist (dietitian) to make a meal plan that is best for you. Your meal plan may vary depending on factors such as:  The calories you need.  The medicines you take.  Your weight.  Your blood glucose, blood pressure, and cholesterol levels.  Your activity level.  Other health conditions you have, such as heart or kidney disease. How do carbohydrates affect me? Carbohydrates, also called carbs, affect your blood glucose level more than any other type of food. Eating carbs naturally raises the amount of glucose in your blood. Carb counting is a method for keeping track of how many carbs you eat. Counting carbs is important to keep your blood glucose at a healthy level, especially if you use insulin or take certain oral diabetes medicines. It is important to know how many carbs you can safely have in each meal. This is different for every person. Your dietitian can help you calculate how many carbs you should have at each meal and  for each snack. Foods that contain carbs include:  Bread, cereal, rice, pasta, and crackers.  Potatoes and corn.  Peas, beans, and lentils.  Milk and yogurt.  Fruit and juice.  Desserts, such as cakes, cookies, ice cream, and candy. How does alcohol affect me? Alcohol can cause a sudden decrease in blood glucose (hypoglycemia), especially if you use insulin or take certain  oral diabetes medicines. Hypoglycemia can be a life-threatening condition. Symptoms of hypoglycemia (sleepiness, dizziness, and confusion) are similar to symptoms of having too much alcohol. If your health care provider says that alcohol is safe for you, follow these guidelines:  Limit alcohol intake to no more than 1 drink per day for nonpregnant women and 2 drinks per day for men. One drink equals 12 oz of beer, 5 oz of wine, or 1 oz of hard liquor.  Do not drink on an empty stomach.  Keep yourself hydrated with water, diet soda, or unsweetened iced tea.  Keep in mind that regular soda, juice, and other mixers may contain a lot of sugar and must be counted as carbs. What are tips for following this plan?  Reading food labels  Start by checking the serving size on the "Nutrition Facts" label of packaged foods and drinks. The amount of calories, carbs, fats, and other nutrients listed on the label is based on one serving of the item. Many items contain more than one serving per package.  Check the total grams (g) of carbs in one serving. You can calculate the number of servings of carbs in one serving by dividing the total carbs by 15. For example, if a food has 30 g of total carbs, it would be equal to 2 servings of carbs.  Check the number of grams (g) of saturated and trans fats in one serving. Choose foods that have low or no amount of these fats.  Check the number of milligrams (mg) of salt (sodium) in one serving. Most people should limit total sodium intake to less than 2,300 mg per day.  Always check  the nutrition information of foods labeled as "low-fat" or "nonfat". These foods may be higher in added sugar or refined carbs and should be avoided.  Talk to your dietitian to identify your daily goals for nutrients listed on the label. Shopping  Avoid buying canned, premade, or processed foods. These foods tend to be high in fat, sodium, and added sugar.  Shop around the outside edge of the grocery store. This includes fresh fruits and vegetables, bulk grains, fresh meats, and fresh dairy. Cooking  Use low-heat cooking methods, such as baking, instead of high-heat cooking methods like deep frying.  Cook using healthy oils, such as olive, canola, or sunflower oil.  Avoid cooking with butter, cream, or high-fat meats. Meal planning  Eat meals and snacks regularly, preferably at the same times every day. Avoid going long periods of time without eating.  Eat foods high in fiber, such as fresh fruits, vegetables, beans, and whole grains. Talk to your dietitian about how many servings of carbs you can eat at each meal.  Eat 4-6 ounces (oz) of lean protein each day, such as lean meat, chicken, fish, eggs, or tofu. One oz of lean protein is equal to: ? 1 oz of meat, chicken, or fish. ? 1 egg. ?  cup of tofu.  Eat some foods each day that contain healthy fats, such as avocado, nuts, seeds, and fish. Lifestyle  Check your blood glucose regularly.  Exercise regularly as told by your health care provider. This may include: ? 150 minutes of moderate-intensity or vigorous-intensity exercise each week. This could be brisk walking, biking, or water aerobics. ? Stretching and doing strength exercises, such as yoga or weightlifting, at least 2 times a week.  Take medicines as told by your health care provider.  Do not use any products that contain nicotine or   tobacco, such as cigarettes and e-cigarettes. If you need help quitting, ask your health care provider.  Work with a counselor or  diabetes educator to identify strategies to manage stress and any emotional and social challenges. Questions to ask a health care provider  Do I need to meet with a diabetes educator?  Do I need to meet with a dietitian?  What number can I call if I have questions?  When are the best times to check my blood glucose? Where to find more information:  American Diabetes Association: diabetes.org  Academy of Nutrition and Dietetics: www.eatright.org  National Institute of Diabetes and Digestive and Kidney Diseases (NIH): www.niddk.nih.gov Summary  A healthy meal plan will help you control your blood glucose and maintain a healthy lifestyle.  Working with a diet and nutrition specialist (dietitian) can help you make a meal plan that is best for you.  Keep in mind that carbohydrates (carbs) and alcohol have immediate effects on your blood glucose levels. It is important to count carbs and to use alcohol carefully. This information is not intended to replace advice given to you by your health care provider. Make sure you discuss any questions you have with your health care provider. Document Revised: 08/22/2017 Document Reviewed: 10/14/2016 Elsevier Patient Education  2020 Elsevier Inc.      Miguel Sagardia, MD [facility] 

## 2020-05-18 LAB — COMPREHENSIVE METABOLIC PANEL
ALT: 36 IU/L (ref 0–44)
AST: 38 IU/L (ref 0–40)
Albumin/Globulin Ratio: 1.9 (ref 1.2–2.2)
Albumin: 4.8 g/dL — ABNORMAL HIGH (ref 3.7–4.7)
Alkaline Phosphatase: 62 IU/L (ref 48–121)
BUN/Creatinine Ratio: 23 (ref 10–24)
BUN: 22 mg/dL (ref 8–27)
Bilirubin Total: 0.8 mg/dL (ref 0.0–1.2)
CO2: 22 mmol/L (ref 20–29)
Calcium: 10.3 mg/dL — ABNORMAL HIGH (ref 8.6–10.2)
Chloride: 103 mmol/L (ref 96–106)
Creatinine, Ser: 0.94 mg/dL (ref 0.76–1.27)
GFR calc Af Amer: 93 mL/min/{1.73_m2} (ref >59)
GFR calc non Af Amer: 81 mL/min/{1.73_m2} (ref >59)
Globulin, Total: 2.5 g/dL (ref 1.5–4.5)
Glucose: 128 mg/dL — ABNORMAL HIGH (ref 65–99)
Potassium: 4.5 mmol/L (ref 3.5–5.2)
Sodium: 141 mmol/L (ref 134–144)
Total Protein: 7.3 g/dL (ref 6.0–8.5)

## 2020-05-18 LAB — SAR COV2 SEROLOGY (COVID19)AB(IGG),IA: DiaSorin SARS-CoV-2 Ab, IgG: POSITIVE

## 2020-06-30 ENCOUNTER — Other Ambulatory Visit: Payer: Self-pay | Admitting: Cardiology

## 2020-06-30 DIAGNOSIS — I2510 Atherosclerotic heart disease of native coronary artery without angina pectoris: Secondary | ICD-10-CM

## 2020-06-30 MED ORDER — METOPROLOL SUCCINATE ER 25 MG PO TB24: 25.0000 mg | ORAL_TABLET | Freq: Every day | ORAL | 0 refills | Status: DC

## 2020-06-30 NOTE — Telephone Encounter (Signed)
*  STAT* If patient is at the pharmacy, call can be transferred to refill team.   1. Which medications need to be refilled? (please list name of each medication and dose if known) metoprolol succinate (TOPROL-XL) 25 MG 24 hr tablet  2. Which pharmacy/location (including street and city if local pharmacy) is medication to be sent to? [id number] - [place], [place] - [address].  3. Do they need a 30 day or 90 day supply? 90 day

## 2020-07-10 ENCOUNTER — Telehealth: Payer: Self-pay | Admitting: Emergency Medicine

## 2020-07-10 NOTE — Telephone Encounter (Signed)
Pt called and stated he would like a nurse to call him regarding his calcium was high the last time he was here in August, and to see if a TRT would work for pt. Please advise.

## 2020-07-13 ENCOUNTER — Other Ambulatory Visit: Payer: Self-pay

## 2020-07-13 ENCOUNTER — Ambulatory Visit (INDEPENDENT_AMBULATORY_CARE_PROVIDER_SITE_OTHER): Payer: Medicare Other | Admitting: Emergency Medicine

## 2020-07-13 DIAGNOSIS — E1169 Type 2 diabetes mellitus with other specified complication: Secondary | ICD-10-CM | POA: Diagnosis not present

## 2020-07-13 DIAGNOSIS — R5383 Other fatigue: Secondary | ICD-10-CM | POA: Diagnosis not present

## 2020-07-13 DIAGNOSIS — T50B95A Adverse effect of other viral vaccines, initial encounter: Secondary | ICD-10-CM

## 2020-07-13 DIAGNOSIS — E785 Hyperlipidemia, unspecified: Secondary | ICD-10-CM

## 2020-07-13 DIAGNOSIS — Z23 Encounter for immunization: Secondary | ICD-10-CM | POA: Diagnosis not present

## 2020-07-13 NOTE — Patient Instructions (Signed)
° ° ° °  If you have lab work done today you will be contacted with your lab results within the next 2 weeks.  If you have not heard from us then please contact us. The fastest way to get your results is to register for My Chart. ° ° °IF you received an x-ray today, you will receive an invoice from [place] [facility]. Please contact [place] [facility] at [phone] with questions or concerns regarding your invoice.  ° °IF you received labwork today, you will receive an invoice from [facility]. Please contact [facility] at [phone] with questions or concerns regarding your invoice.  ° °Our billing staff will not be able to assist you with questions regarding bills from these companies. ° °You will be contacted with the lab results as soon as they are available. The fastest way to get your results is to activate your My Chart account. Instructions are located on the last page of this paperwork. If you have not heard from us regarding the results in 2 weeks, please contact this office. °  ° ° ° °

## 2020-07-13 NOTE — Telephone Encounter (Signed)
Pt wanted lipid panel as well as flu shot nurse visit made for today

## 2020-07-16 LAB — LIPID PANEL
Chol/HDL Ratio: 4.8 ratio (ref 0.0–5.0)
Cholesterol, Total: 172 mg/dL (ref 100–199)
HDL: 36 mg/dL — ABNORMAL LOW (ref >39)
LDL Chol Calc (NIH): 86 mg/dL (ref 0–99)
Triglycerides: 306 mg/dL — ABNORMAL HIGH (ref 0–149)
VLDL Cholesterol Cal: 50 mg/dL — ABNORMAL HIGH (ref 5–40)

## 2020-07-16 LAB — TESTOSTERONE, FREE, TOTAL, SHBG
Sex Hormone Binding: 25.4 nmol/L (ref 19.3–76.4)
Testosterone, Free: 5.2 pg/mL — ABNORMAL LOW (ref 6.6–18.1)
Testosterone: 159 ng/dL — ABNORMAL LOW (ref 264–916)

## 2020-07-18 ENCOUNTER — Ambulatory Visit: Payer: Self-pay

## 2020-07-24 ENCOUNTER — Other Ambulatory Visit: Payer: Self-pay

## 2020-07-24 ENCOUNTER — Ambulatory Visit (INDEPENDENT_AMBULATORY_CARE_PROVIDER_SITE_OTHER): Payer: Medicare Other

## 2020-07-24 ENCOUNTER — Encounter: Payer: Self-pay | Admitting: Family Medicine

## 2020-07-24 ENCOUNTER — Ambulatory Visit (INDEPENDENT_AMBULATORY_CARE_PROVIDER_SITE_OTHER): Payer: Medicare Other | Admitting: Family Medicine

## 2020-07-24 VITALS — BP 143/82 | HR 89 | Temp 98.2°F | Ht 69.0 in | Wt 207.0 lb

## 2020-07-24 DIAGNOSIS — Z1211 Encounter for screening for malignant neoplasm of colon: Secondary | ICD-10-CM

## 2020-07-24 DIAGNOSIS — R7989 Other specified abnormal findings of blood chemistry: Secondary | ICD-10-CM | POA: Diagnosis not present

## 2020-07-24 DIAGNOSIS — S8992XA Unspecified injury of left lower leg, initial encounter: Secondary | ICD-10-CM | POA: Diagnosis not present

## 2020-07-24 DIAGNOSIS — M25562 Pain in left knee: Secondary | ICD-10-CM | POA: Diagnosis not present

## 2020-07-24 NOTE — Progress Notes (Signed)
11/1/20213:13 PM  Gregory Mathews. September 12, 1946, 74 y.o., male [id number]  Chief Complaint  Patient presents with  . L knee injury    x week / landed om it wrong   . TRT    discuss levels   . Referral    colonoscopy    HPI:   Patient is a 74 y.o. male with past medical history significant for HTN, DM who presents today for knee pain and low testosterone.  Left knee injury [employer] daily Landed on foot wrong when going into the pool Happened last Tuesday Better but not 100% Ice, medicated patches and ointment. Takes Tylenol for pain Able to walk on it Denies swelling, no sounds with movement.  Statins cause myalgias and fatigue Tried Praluent but continues to have myalgias He read this may be due to his low testosterone So he restarted praluent Now would like to discuss testosterone replacement      Depression screen [facility] 2/9 07/24/2020 05/17/2020 01/21/2020  Decreased Interest 0 0 0  Down, Depressed, Hopeless 0 0 0  PHQ - 2 Score 0 0 0    Fall Risk  07/24/2020 01/21/2020 12/13/2019 11/18/2019 09/21/2019  Falls in the past year? 0 0 0 0 0  Number falls in past yr: 0 0 - - -  Injury with Fall? 0 0 - - -  Follow up Falls evaluation completed Falls evaluation completed;Education provided Falls evaluation completed - Falls evaluation completed     Allergies  Allergen Reactions  . Januvia [Sitagliptin] Other (See Comments)    Gi upset and kidney problems  . Lisinopril Cough  . Statins Other (See Comments)    Severe leg pain & fatigue -- tried at least 4 (even low dose)    Prior to Admission medications   Medication Sig Start Date End Date Taking? Authorizing Provider  Alirocumab (PRALUENT) 150 MG/ML SOAJ Inject 150 mg into the skin every 14 (fourteen) days. 08/11/19  Yes Leonie Man, MD  blood glucose meter kit and supplies Use to test blood sugar daily. Dx: E11.9 10/11/15  Yes Tereasa Coop, PA-C  clopidogrel (PLAVIX) 75 MG tablet Take 1 tablet (75 mg total)  by mouth daily. 07/08/19  Yes Sagardia, Ines Bloomer, MD  glipiZIDE (GLUCOTROL) 5 MG tablet Take 1 tablet (5 mg total) by mouth 2 (two) times daily before a meal. 07/08/19 07/24/20 Yes Sagardia, Ines Bloomer, MD  glucose blood [facility] ULTRA) test strip USE TO TEST BLOOD SUGAR ONCE DAILY 07/08/19  Yes Horald Pollen, MD  Lancets MISC Use to test blood sugar daily. Dx code :E11.9 07/08/19  Yes Sagardia, Ines Bloomer, MD  losartan (COZAAR) 25 MG tablet Take 1 tablet (25 mg total) by mouth daily. 07/08/19  Yes Sagardia, Ines Bloomer, MD  metoprolol succinate (TOPROL-XL) 25 MG 24 hr tablet Take 1 tablet (25 mg total) by mouth daily. 06/30/20  Yes Leonie Man, MD  Multiple Vitamin (MULTIVITAMIN) tablet Take 1 tablet by mouth daily.   Yes [provider]  empagliflozin (JARDIANCE) 25 MG TABS tablet Take 1 tablet (25 mg total) by mouth daily before breakfast. Patient not taking: Reported on 07/24/2020 05/17/20 08/15/20  Horald Pollen, MD    Past Medical History:  Diagnosis Date  . Allergy   . Basal cell carcinoma 01/2013   Nasal and R forearm.  [facility]  . CAD S/P percutaneous coronary angioplasty    2/19 PCI/DESx1 to mLAD, FFR 0.7 -[place] DES 2.75 mm x 18 mm  . Cataract   .  Diabetes mellitus without complication (HCC)    on PO Meds  . Essential hypertension   . Heart murmur   . Hyperlipidemia    statin intolerant -- myalgias, fatigue (tried at least 4)  . Rosacea     Past Surgical History:  Procedure Laterality Date  . basal cell removal     nose and wrist   . BELPHAROPTOSIS REPAIR    . CORONARY CT ANGIOGRAM  09/2017   Coronary Calcium Score: 111. Coronary calcification noted in the LEFT MAIN (LM) and prox LAD.  LM < 50% calcified stenosis.  Mid LAD 50-75% plaque noted CT FFR --> positive at 0.80.  Recommend CARDIAC CATHETERIZATION  . CORONARY STENT INTERVENTION N/A 11/12/2017   Procedure: CORONARY STENT INTERVENTION;  Surgeon: Leonie Man, MD;   Location: [place] [facility];  Service: Cardiovascular:  DES PCI:  STENT SIERRA 2.75 X 18 MM - Post intervention, there is a 0% residual stenosis.  Marland Kitchen ETT/GXT: Graded Exercise Tolerance Test  08/2015    Exercised for 9:01 min --> blunted blood pressure response no EKG changes.  No chest pain.  Low Risk  . EYE SURGERY     Cataract surgery  . eyelid surgery    . INTRAVASCULAR PRESSURE WIRE/FFR STUDY N/A 11/12/2017   Procedure: INTRAVASCULAR PRESSURE WIRE/FFR STUDY;  Surgeon: Leonie Man, MD;  Location: [place] [facility];  Service: Cardiovascular;  Laterality: LAD -FFR 0.76  . LEFT HEART CATH AND CORONARY ANGIOGRAPHY N/A 11/12/2017   Procedure: LEFT HEART CATH AND CORONARY ANGIOGRAPHY;  Surgeon: Leonie Man, MD;  Location: [facility]: 70% P-M LAD (FFR 0.76).  40% mid LAD, 50% ostial ramus.  EF 55-60%.  Mildly elevated LVEDP.  Marland Kitchen SHOULDER OPEN ROTATOR CUFF REPAIR  1983  . TRANSTHORACIC ECHOCARDIOGRAM  08/2017   EF 55-60%.  Mild aortic stenosis (mean gradient 11 mmH.)  GR 1 DD.  Mild LA dilation.    Social History   Tobacco Use  . Smoking status: Former Smoker    Packs/day: 1.00    Years: 15.00    Pack years: 15.00    Quit date: 07/09/1982    Years since quitting: 38.0  . Smokeless tobacco: Never Used  Substance Use Topics  . Alcohol use: No    Family History  Problem Relation Age of Onset  . Leukemia Mother   . Stroke Father 61       as a complication of CABG  . Colon cancer Father 81  . Cancer Father 34       Colon cancer  . Coronary artery disease Father 10       - Sx was DOE - MV CAD  . Peripheral Artery Disease Father        presumptive  . Diabetes Sister   . Sudden Cardiac Death Sister 48       presumed MI  . Heart attack Brother 81       During throat surgery  . Congestive Heart Failure Brother        Died @ 58 - ? CHF / ICM  . Cancer Maternal Grandmother   . Stroke Maternal Grandfather   . Diabetes Brother 64       on lots of meds  . Diabetes  Brother   . Cancer Brother        colon cancer  . Rheumatic fever Brother        childhood RF --> Valvular Dz - valve Sgx,  died young  Review of Systems  Constitutional: Negative for chills, fever and malaise/fatigue.  Eyes: Negative for blurred vision and double vision.  Respiratory: Negative for cough, shortness of breath and wheezing.   Cardiovascular: Negative for chest pain, palpitations and leg swelling.  Gastrointestinal: Negative for abdominal pain, blood in stool, constipation, diarrhea, heartburn, nausea and vomiting.  Genitourinary: Negative for dysuria, frequency and hematuria.  Musculoskeletal: Positive for myalgias. Negative for back pain.       Left knee pain  Skin: Negative for rash.  Neurological: Negative for dizziness, weakness and headaches.     OBJECTIVE:  Today's Vitals   07/24/20 1433  BP: (!) 143/82  Pulse: 89  Temp: 98.2 F (36.8 C)  SpO2: 96%  Weight: 207 lb (93.9 kg)  Height: '5\' 9"'  (1.753 m)   Body mass index is 30.57 kg/m.   Physical Exam Vitals reviewed.  Constitutional:      Appearance: Normal appearance.  HENT:     Head: Normocephalic and atraumatic.  Eyes:     Conjunctiva/sclera: Conjunctivae normal.     Pupils: Pupils are equal, round, and reactive to light.  Cardiovascular:     Rate and Rhythm: Normal rate and regular rhythm.     Pulses: Normal pulses.     Heart sounds: Normal heart sounds. No murmur heard.  No friction rub. No gallop.   Pulmonary:     Effort: Pulmonary effort is normal. No respiratory distress.     Breath sounds: Normal breath sounds. No stridor. No wheezing or rales.  Abdominal:     General: Bowel sounds are normal.     Palpations: Abdomen is soft.     Tenderness: There is no abdominal tenderness.  Musculoskeletal:     Right knee: No swelling, deformity, effusion, erythema, ecchymosis, bony tenderness or crepitus. Normal range of motion. No tenderness. Normal patellar mobility.     Left knee: No  swelling, deformity, effusion, erythema, ecchymosis, bony tenderness or crepitus. Normal range of motion. No tenderness. Normal patellar mobility.     Right lower leg: No edema.     Left lower leg: No edema.  Skin:    General: Skin is warm and dry.  Neurological:     General: No focal deficit present.     Mental Status: He is alert and oriented to person, place, and time.  Psychiatric:        Mood and Affect: Mood normal.        Behavior: Behavior normal.     No results found for this or any previous visit (from the past 24 hour(s)).  DG Knee Complete 4 Views Left  Result Date: 07/24/2020 CLINICAL DATA:  LEFT knee pain EXAM: LEFT KNEE - COMPLETE 4+ VIEW COMPARISON:  None. FINDINGS: No fracture of the proximal tibia or distal femur. Patella is normal. No joint effusion. IMPRESSION: No fracture or dislocation. Electronically Signed   By: Suzy Bouchard M.D.   On: 07/24/2020 15:08     ASSESSMENT and PLAN  Problem List Items Addressed This Visit    None    Visit Diagnoses    Encounter for screening colonoscopy    -  Primary   Relevant Orders   Ambulatory referral to Gastroenterology   Injury of left knee, initial encounter       Relevant Orders   DG Knee Complete 4 Views Left: No abnormalities seen at this time. Rest, Ice, Elevation discussed Continue with medicated patches and ointments for pain and tylenol Encouraged to follow up if worsens or no  improvement seen for referral to ortho or MRI   Low testosterone in male       Relevant Orders   Testosterone Total,Free,Bio, Males Discussed that initial test should be a serum testosterone measurement early in the morning, fasting. If the result is low, the test should be repeated at least once, preferably twice for diagnosis. Will follow up with results       Return for next schduled appointment with Dr. Mitchel Honour.    Gregory Foley Seleena Reimers, FNP-BC Primary [facility] at [place], [place] [address] Ph.  [phone]  Fax [phone]

## 2020-07-24 NOTE — Patient Instructions (Addendum)
Will refer to ortho if no improvement seen Continue to rest, ice, tylenol as needed for pain, continue to use ointments and patches as needed for pain. Nurse visit tomorrow for fasting labs  Acute Knee Pain, Adult Many things can cause knee pain. Sometimes, knee pain is sudden (acute) and may be caused by damage, swelling, or irritation of the muscles and tissues that support your knee. The pain often goes away on its own with time and rest. If the pain does not go away, tests may be done to find out what is causing the pain. Follow these instructions at home: Pay attention to any changes in your symptoms. Take these actions to relieve your pain. If you have a knee sleeve or brace:   Wear the sleeve or brace as told by your doctor. Remove it only as told by your doctor.  Loosen the sleeve or brace if your toes: ? Tingle. ? Become numb. ? Turn cold and blue.  Keep the sleeve or brace clean.  If the sleeve or brace is not waterproof: ? Do not let it get wet. ? Cover it with a watertight covering when you take a bath or shower. Activity  Rest your knee.  Do not do things that cause pain.  Avoid activities where both feet leave the ground at the same time (high-impact activities). Examples are running, jumping rope, and doing jumping jacks.  Work with a physical therapist to make a safe exercise program, as told by your doctor. Managing pain, stiffness, and swelling   If told, put ice on the knee: ? Put ice in a plastic bag. ? Place a towel between your skin and the bag. ? Leave the ice on for 20 minutes, 2-3 times a day.  If told, put pressure (compression) on your injured knee to control swelling, give support, and help with discomfort. Compression may be done with an elastic bandage. General instructions  Take all medicines only as told by your doctor.  Raise (elevate) your knee while you are sitting or lying down. Make sure your knee is higher than your heart.  Sleep  with a pillow under your knee.  Do not use any products that contain nicotine or tobacco. These include cigarettes, e-cigarettes, and chewing tobacco. These products may slow down healing. If you need help quitting, ask your doctor.  If you are overweight, work with your doctor and a food expert (dietitian) to set goals to lose weight. Being overweight can make your knee hurt more.  Keep all follow-up visits as told by your doctor. This is important. Contact a doctor if:  The knee pain does not stop.  The knee pain changes or gets worse.  You have a fever along with knee pain.  Your knee feels warm when you touch it.  Your knee gives out or locks up. Get help right away if:  Your knee swells, and the swelling gets worse.  You cannot move your knee.  You have very bad knee pain. Summary  Many things can cause knee pain. The pain often goes away on its own with time and rest.  Your doctor may do tests to find out the cause of the pain.  Pay attention to any changes in your symptoms. Relieve your pain with rest, medicines, light activity, and use of ice.  Get help right away if you cannot move your knee or your knee pain is very bad. This information is not intended to replace advice given to you by your  health care provider. Make sure you discuss any questions you have with your health care provider. Document Revised: 02/19/2018 Document Reviewed: 02/19/2018 Elsevier Patient Education  [employer].   If you have lab work done today you will be contacted with your lab results within the next 2 weeks.  If you have not heard from [place] then please contact us. The fastest way to get your results is to register for My Chart.   IF you received an x-ray today, you will receive an invoice from [facility]. Please contact [facility] at [phone] with questions or concerns regarding your invoice.   IF you received labwork today, you will receive an invoice  from [facility]. Please contact [facility] at [phone] with questions or concerns regarding your invoice.   Our billing staff will not be able to assist you with questions regarding bills from these companies.  You will be contacted with the lab results as soon as they are available. The fastest way to get your results is to activate your My Chart account. Instructions are located on the last page of this paperwork. If you have not heard from [place] regarding the results in 2 weeks, please contact this office.

## 2020-07-25 ENCOUNTER — Ambulatory Visit (INDEPENDENT_AMBULATORY_CARE_PROVIDER_SITE_OTHER): Payer: Medicare Other | Admitting: Registered Nurse

## 2020-07-25 ENCOUNTER — Ambulatory Visit: Payer: Medicare Other

## 2020-07-25 DIAGNOSIS — R7989 Other specified abnormal findings of blood chemistry: Secondary | ICD-10-CM | POA: Diagnosis not present

## 2020-07-26 LAB — TESTOSTERONE,FREE AND TOTAL
Testosterone, Free: 10.6 pg/mL (ref 6.6–18.1)
Testosterone: 219 ng/dL — ABNORMAL LOW (ref 264–916)

## 2020-07-30 ENCOUNTER — Other Ambulatory Visit: Payer: Self-pay | Admitting: Emergency Medicine

## 2020-07-30 DIAGNOSIS — E1165 Type 2 diabetes mellitus with hyperglycemia: Secondary | ICD-10-CM

## 2020-08-17 ENCOUNTER — Other Ambulatory Visit: Payer: Self-pay | Admitting: Emergency Medicine

## 2020-08-17 DIAGNOSIS — Z955 Presence of coronary angioplasty implant and graft: Secondary | ICD-10-CM

## 2020-08-17 DIAGNOSIS — I10 Essential (primary) hypertension: Secondary | ICD-10-CM

## 2020-08-31 ENCOUNTER — Other Ambulatory Visit: Payer: Self-pay

## 2020-08-31 ENCOUNTER — Encounter: Payer: Self-pay | Admitting: Cardiology

## 2020-08-31 ENCOUNTER — Ambulatory Visit (INDEPENDENT_AMBULATORY_CARE_PROVIDER_SITE_OTHER): Payer: Medicare Other | Admitting: Cardiology

## 2020-08-31 VITALS — BP 150/90 | HR 57 | Ht 70.0 in | Wt 208.8 lb

## 2020-08-31 DIAGNOSIS — I350 Nonrheumatic aortic (valve) stenosis: Secondary | ICD-10-CM | POA: Diagnosis not present

## 2020-08-31 DIAGNOSIS — T466X5A Adverse effect of antihyperlipidemic and antiarteriosclerotic drugs, initial encounter: Secondary | ICD-10-CM

## 2020-08-31 DIAGNOSIS — Z789 Other specified health status: Secondary | ICD-10-CM

## 2020-08-31 DIAGNOSIS — I2510 Atherosclerotic heart disease of native coronary artery without angina pectoris: Secondary | ICD-10-CM | POA: Diagnosis not present

## 2020-08-31 DIAGNOSIS — E1169 Type 2 diabetes mellitus with other specified complication: Secondary | ICD-10-CM

## 2020-08-31 DIAGNOSIS — I152 Hypertension secondary to endocrine disorders: Secondary | ICD-10-CM

## 2020-08-31 DIAGNOSIS — G720 Drug-induced myopathy: Secondary | ICD-10-CM | POA: Diagnosis not present

## 2020-08-31 DIAGNOSIS — R931 Abnormal findings on diagnostic imaging of heart and coronary circulation: Secondary | ICD-10-CM

## 2020-08-31 DIAGNOSIS — E1159 Type 2 diabetes mellitus with other circulatory complications: Secondary | ICD-10-CM | POA: Diagnosis not present

## 2020-08-31 DIAGNOSIS — E785 Hyperlipidemia, unspecified: Secondary | ICD-10-CM

## 2020-08-31 MED ORDER — METOPROLOL SUCCINATE ER 25 MG PO TB24: 25.0000 mg | ORAL_TABLET | Freq: Every day | ORAL | 3 refills | Status: DC

## 2020-08-31 MED ORDER — LOSARTAN POTASSIUM 50 MG PO TABS: 50.0000 mg | ORAL_TABLET | Freq: Every day | ORAL | 3 refills | Status: DC

## 2020-08-31 NOTE — Patient Instructions (Addendum)
Medication Instructions:   start taking increase dose of Losartan 50 mg      *If you need a refill on your cardiac medications before your next appointment, please call your pharmacy*   Lab Work: Not needed If you have labs (blood work) drawn today and your tests are completely normal, you will receive your results only by: Marland Kitchen MyChart Message (if you have MyChart) OR . A paper copy in the mail If you have any lab test that is abnormal or we need to change your treatment, we will call you to review the results.   Testing/Procedures:  Not needed  Follow-Up: At [facility], you and your health needs are our priority.  As part of our continuing mission to provide you with exceptional heart care, we have created designated Provider Care Teams.  These Care Teams include your primary Cardiologist (physician) and Advanced Practice Providers (APPs -  Physician Assistants and Nurse Practitioners) who all work together to provide you with the care you need, when you need it.     Your next appointment:   12 month(s)  The format for your next appointment:   In Person  Provider:   Glenetta Hew, MD

## 2020-08-31 NOTE — Progress Notes (Signed)
Primary Care Provider: Horald Pollen, MD Cardiologist: No primary care provider on file. Electrophysiologist: None  Clinic Note: Chief Complaint  Patient presents with  . Follow-up    Annual-delayed  . Coronary Artery Disease    No active angina  . Hyperlipidemia    Significant myalgias/myopathy with statins; now on Praluent    Problem List Items Addressed This Visit    Coronary artery disease involving native heart without angina pectoris (Chronic)   Hyperlipidemia associated with type 2 diabetes mellitus (HCC) (Chronic) - Myalgias with Statin - On Praluent   Mild aortic stenosis by prior echocardiogram - Primary (Chronic)   Hypertension associated with diabetes (HCC) (Chronic)      HPI:    Gregory Mathews. is a 74 y.o. male with a PMH notable for CAD having PCI LAD (abnormal coronary CTA due to strong family history of CAD), and MILD AORTIC STENOSIS, HYPERLIPIDEMIA (on Praluent because of myalgia with statins) below who presents today for annual follow-up.   He was initially seen in consultation in Dec 2018 for atypical chest discomfort. Due to a strong family history, we checked an ECHOCARDIOGRAM as well as a CORONARY CT ANGIOGRAM which revealed mild aortic stenosis and significant CT FFR positive proximal LAD lesion that was confirmed by cardiac catheterization (extensive 50% followed by 75% plaque) -treated with DES PCI in February 2019  Fam Hx of CAD -> Father (MI-CABG, CVA), Sister & Brother (CAD-PCI in their 108s) all with CAD.    Gregory Mathews. was last seen on  Nov 16,2020 for delayed follow-up (previously seen February 2019 for post PCI follow-up).  At that time had been referred to [facility] lipid clinic for possible PCSK9 inhibitor (but he was reluctant to try).  Was started on pravastatin 20 mg as well as Vascepa, but did not tolerate either. ->  At the time of this visit was not on any medications for lipids due to myalgia. -> Was doing well from cardiac  standpoint.  Frustrated about not be a do exercise at the [facility].  Still volunteering at the [employer], and doing as much walking as possible.  --> Had COVID in Dec 2020 -> recovered without too much complication.  Since I last saw him he has had lots of issues with his family: 2 family members died:  One of his Sisters had recently died of MI while another Sister died of cardiac complications during Hip Sgx.   He also has a brother who recently developed A. Fib associated with recurrent MI.   Recent Hospitalizations: None  Reviewed  CV studies:    The following studies were reviewed today: (if available, images/films reviewed: From Epic Chart or Care Everywhere) . Echo 10/19/2019: EF 60-65%. NO RWMA. Mild LA dilation. Moderate Aortic Valve calcification - ~ Mild AS.  (mean gradient 12 mmHg)  Interval History:   Gregory Rosko Jr. returns here today for annual follow-up stating he is doing fairly well overall from cardiac standpoint.  He remains very active at baseline and during routine exercise.  He walks about 45 minutes a day for 6 to 7 days a week and on 5 days a week does exercise machines or possible, he will swim for 45 minutes.  He does note that his leg is sore off and on, but usually does at the end of a long walk.  He had started on Praluent, but then held off of it for couple months because his legs were sore, but that did not change  his symptoms.  As such she is back on Praluent but is only been on it regularly for short period of time.  He says his blood pressure is usually better than it is here today, but does not routinely check his pressures.  He is concerned, because he did not have any anginal symptoms despite having a pretty significant LAD lesion.  He is worried that may be something else may develop.  We discussed checking a stress test in the future.  He has not had any heart failure symptoms of PND, orthopnea or edema nor has had any symptomatic arrhythmia concerns  with palpitations/irregular heartbeats.  CV Review of Symptoms (Summary) Cardiovascular ROS: no chest pain or dyspnea on exertion positive for - Significant myalgias with even pravastatin negative for - edema, irregular heartbeat, orthopnea, palpitations, paroxysmal nocturnal dyspnea, rapid heart rate, shortness of breath or Syncope/near syncope or TIA/versus fugax, claudication  The patient does not have symptoms concerning for COVID-19 infection (fever, chills, cough, or new shortness of breath).   REVIEWED OF SYSTEMS   Review of Systems  Constitutional: Negative for malaise/fatigue and weight loss.  HENT: Negative for congestion and sinus pain.   Respiratory: Negative for cough and shortness of breath.   Gastrointestinal: Negative for abdominal pain, blood in stool, diarrhea and melena.  Musculoskeletal: Positive for myalgias (Legs get sore-worse at night (exacerbated by even low-dose statin), also has some mild venous stasis.). Negative for falls and joint pain.  Neurological: Negative for dizziness, tingling, focal weakness, weakness and headaches.  Psychiatric/Behavioral: Negative for memory loss. The patient is not nervous/anxious and does not have insomnia.    I have reviewed and (if needed) personally updated the patient's problem list, medications, allergies, past medical and surgical history, social and family history.   PAST MEDICAL HISTORY   Past Medical History:  Diagnosis Date  . Allergy   . Basal cell carcinoma 01/2013   Nasal and R forearm.  [facility]  . CAD S/P percutaneous coronary angioplasty    2/19 PCI/DESx1 to mLAD, FFR 0.7 -[place] DES 2.75 mm x 18 mm  . Cataract   . Diabetes mellitus without complication (HCC)    on PO Meds  . Essential hypertension   . Heart murmur   . Hyperlipidemia    statin intolerant -- myalgias, fatigue (tried at least 4)  . Rosacea     PAST SURGICAL HISTORY   Past Surgical History:  Procedure Laterality Date  .  basal cell removal     nose and wrist   . BELPHAROPTOSIS REPAIR    . CORONARY CT ANGIOGRAM  09/2017   Coronary Calcium Score: 111. Coronary calcification noted in the LEFT MAIN (LM) and prox LAD.  LM < 50% calcified stenosis.  Mid LAD 50-75% plaque noted CT FFR --> positive at 0.80.  Recommend CARDIAC CATHETERIZATION  . CORONARY STENT INTERVENTION N/A 11/12/2017   Procedure: CORONARY STENT INTERVENTION;  Surgeon: Leonie Man, MD;  Location: [place] [facility];  Service: Cardiovascular:  DES PCI:  STENT SIERRA 2.75 X 18 MM - Post intervention, there is a 0% residual stenosis.  Marland Kitchen ETT/GXT: Graded Exercise Tolerance Test  08/2015    Exercised for 9:01 min --> blunted blood pressure response no EKG changes.  No chest pain.  Low Risk  . EYE SURGERY     Cataract surgery  . eyelid surgery    . INTRAVASCULAR PRESSURE WIRE/FFR STUDY N/A 11/12/2017   Procedure: INTRAVASCULAR PRESSURE WIRE/FFR STUDY;  Surgeon: Leonie Man, MD;  Location: [place] [facility];  Service: Cardiovascular;  Laterality: LAD -FFR 0.76  . LEFT HEART CATH AND CORONARY ANGIOGRAPHY N/A 11/12/2017   Procedure: LEFT HEART CATH AND CORONARY ANGIOGRAPHY;  Surgeon: Leonie Man, MD;  Location: [facility]: 70% P-M LAD (FFR 0.76).  40% mid LAD, 50% ostial ramus.  EF 55-60%.  Mildly elevated LVEDP.  Marland Kitchen SHOULDER OPEN ROTATOR CUFF REPAIR  1983  . TRANSTHORACIC ECHOCARDIOGRAM  08/2017   EF 55-60%.  Mild aortic stenosis (mean gradient 11 mmH.)  GR 1 DD.  Mild LA dilation.  11/12/2017 Cath-PCI LAD  Immunization History  Administered Date(s) Administered  . Fluad Quad(high Dose 65+) 07/08/2019, 07/13/2020  . Influenza Split 07/09/2012  . Influenza, High Dose Seasonal PF 06/12/2018  . Influenza,inj,Quad PF,6+ Mos 07/20/2013, 07/19/2014, 08/01/2015, 08/14/2016, 09/10/2017  . Influenza,trivalent, recombinat, inj, PF 06/13/2011  . Pneumococcal Conjugate-13 08/22/2014  . Pneumococcal Polysaccharide-23 07/09/2012, 03/11/2018  .  Tdap 06/13/2011, 08/01/2015  . Zoster 09/23/2005, 02/10/2012    MEDICATIONS/ALLERGIES   Current Meds  Medication Sig  . blood glucose meter kit and supplies Use to test blood sugar daily. Dx: E11.9  . clopidogrel (PLAVIX) 75 MG tablet TAKE 1 TABLET BY MOUTH EVERY DAY  . glipiZIDE (GLUCOTROL) 5 MG tablet TAKE 1 TABLET (5 MG TOTAL) BY MOUTH 2 (TWO) TIMES DAILY BEFORE A MEAL.  Marland Kitchen Lancets MISC Use to test blood sugar daily. Dx code :E11.9  . Multiple Vitamin (MULTIVITAMIN) tablet Take 1 tablet by mouth daily.  . [DISCONTINUED] Alirocumab (PRALUENT) 150 MG/ML SOAJ Inject 150 mg into the skin every 14 (fourteen) days.  . [DISCONTINUED] glucose blood (ONETOUCH ULTRA) test strip USE TO TEST BLOOD SUGAR ONCE DAILY  . [DISCONTINUED] losartan (COZAAR) 25 MG tablet TAKE 1 TABLET BY MOUTH EVERY DAY  . [DISCONTINUED] metoprolol succinate (TOPROL-XL) 25 MG 24 hr tablet Take 1 tablet (25 mg total) by mouth daily.    Allergies  Allergen Reactions  . Januvia [Sitagliptin] Other (See Comments)    Gi upset and kidney problems  . Lisinopril Cough  . Statins Other (See Comments)    Severe leg pain & fatigue -- tried at least 4 (even low dose)    SOCIAL HISTORY/FAMILY HISTORY   Reviewed in Epic:  Pertinent findings:  Family Hx changes - noted above in Previsit History  OBJCTIVE -PE, EKG, labs   Wt Readings from Last 3 Encounters:  08/31/20 208 lb 12.8 oz (94.7 kg)  07/24/20 207 lb (93.9 kg)  05/17/20 207 lb 12.8 oz (94.3 kg)    Physical Exam: BP (!) 150/90   Pulse (!) 57   Ht _0  (1.778 m)   Wt 208 lb 12.8 oz (94.7 kg)   SpO2 97%   BMI 29.96 kg/m  Physical Exam Vitals reviewed.  Constitutional:      General: He is not in acute distress.    Appearance: Normal appearance. He is normal weight. He is not ill-appearing or toxic-appearing.     Comments: Well-groomed.  Healthy-appearing.  Borderline obese.  Neck:     Vascular: No carotid bruit, hepatojugular reflux or JVD.   Cardiovascular:     Rate and Rhythm: Normal rate and regular rhythm.  No extrasystoles are present.    Chest Wall: PMI is not displaced.     Pulses: Normal pulses.     Heart sounds: Murmur (1/6 SEM at RUSB--neck) heard.  No friction rub. No gallop.   Pulmonary:     Effort: Pulmonary effort is normal. No respiratory distress.  Breath sounds: Normal breath sounds.  Chest:     Chest wall: No tenderness.  Musculoskeletal:        General: No swelling. Normal range of motion.     Cervical back: Normal range of motion and neck supple.  Skin:    Comments: Mild varicose/spider veins.  No significant venous stasis dermatitis.  Neurological:     General: No focal deficit present.     Mental Status: He is alert and oriented to person, place, and time. Mental status is at baseline.  Psychiatric:        Mood and Affect: Mood normal.        Behavior: Behavior normal.        Thought Content: Thought content normal.        Judgment: Judgment normal.     Adult ECG Report  Rate: 57 ;  Rhythm: sinus bradycardia and Normal axis, intervals and durations.;   Narrative Interpretation: Stable  Recent Labs:  Reviewed  Lab Results  Component Value Date   CHOL 172 07/13/2020   HDL 36 (L) 07/13/2020   LDLCALC 86 07/13/2020   TRIG 306 (H) 07/13/2020   CHOLHDL 4.8 07/13/2020   Lab Results  Component Value Date   CREATININE 0.94 05/17/2020   BUN 22 05/17/2020   NA 141 05/17/2020   K 4.5 05/17/2020   CL 103 05/17/2020   CO2 22 05/17/2020   Lab Results  Component Value Date   TSH 1.920 10/01/2018    ASSESSMENT/PLAN    Problem List Items Addressed This Visit    Coronary artery disease involving native heart without angina pectoris - Primary (Chronic)    No further angina after his LAD-PCI.  He had borderline symptoms at that time and was somewhat concerned about monitoring.  Plan:  Continue low-dose beta-blocker now with increasing dose of ARB  Not on statin because of myopathy-on  Praluent  On maintenance dose clopidogrel because of proximal LAD stent  Okay to hold clopidogrel 5 days preop for surgeries or procedures.      Relevant Medications   metoprolol succinate (TOPROL-XL) 25 MG 24 hr tablet   losartan (COZAAR) 50 MG tablet   Other Relevant Orders   EKG 12-Lead (Completed)   Hyperlipidemia associated with type 2 diabetes mellitus ([place]) (Chronic)    He has been on atorvastatin, simvastatin and rosuvastatin as well as pravastatin and Vascepa.  All discontinued due to myopathy and severe myalgias.  Target LDL should be less than 55.  Plan: Continue Praluent.  Unfortunately, he was off of it for couple months and therefore his lipids are not likely as well-controlled as they should be.  Not currently on anything besides glipizide for diabetes, would consider the possibility of at least Metformin plus/minus SGLT2 inhibitor  His lipids have been followed closely by his PCP.  Should be due to be checked at least April timeframe.      Relevant Medications   losartan (COZAAR) 50 MG tablet   Mild aortic stenosis by prior echocardiogram (Chronic)    Pretty mild aortic stenosis with mean gradient only 12 mmHg back in January of this year.  At this point would follow-up likely in 3 years, unless symptoms warrant.      Relevant Medications   metoprolol succinate (TOPROL-XL) 25 MG 24 hr tablet   losartan (COZAAR) 50 MG tablet   Other Relevant Orders   EKG 12-Lead (Completed)   Hypertension associated with diabetes (HCC) (Chronic)    Blood pressure has been well controlled,  but high today.  Plan: Increase losartan to 50 mg daily.  Continue Toprol at 25 mg (unable to titrate further because of bradycardia) Maintain blood pressure log.      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  Was not even able to tolerate low-dose pravastatin after having tried atorvastatin and rosuvastatin.  Therefore no longer on statin.  Lipids being managed with PCSK9 inhibitor.          COVID-19 Education: The signs and symptoms of COVID-19 were discussed with the patient and how to seek care for testing (follow up with PCP or arrange E-visit).   The importance of social distancing and COVID-19 vaccination was discussed today. 1 min The patient is practicing social distancing & Masking.   I spent a total of 66mnutes with the patient spent in direct patient consultation.  Additional time spent with chart review  / charting (studies, outside notes, etc): 8 Total Time: 36 min   Current medicines are reviewed at length with the patient today.  (+/- concerns) n/a  This visit occurred during the SARS-CoV-2 public health emergency.  Safety protocols were in place, including screening questions prior to the visit, additional usage of staff PPE, and extensive cleaning of exam room while observing appropriate contact time as indicated for disinfecting solutions.  Notice: This dictation was prepared with Dragon dictation along with smaller phrase technology. Any transcriptional errors that result from this process are unintentional and may not be corrected upon review.  Patient Instructions / Medication Changes & Studies & Tests Ordered   Patient Instructions  Medication Instructions:   start taking increase dose of Losartan 50 mg      *If you need a refill on your cardiac medications before your next appointment, please call your pharmacy*   Lab Work: Not needed If you have labs (blood work) drawn today and your tests are completely normal, you will receive your results only by: .Marland KitchenMyChart Message (if you  have MyChart) OR . A paper copy in the mail If you have any lab test that is abnormal or we need to change your treatment, we will call you to review the results.   Testing/Procedures:  Not needed  Follow-Up: [facility] and [facility] needs are our [facility].  As part of our continuing mission to provide you with exceptional heart care, we have created designated Provider Care Teams.  These Care Teams include your primary Cardiologist (physician) and Advanced Practice Providers (APPs -  Physician Assistants and Nurse Practitioners) who all work together to provide you with the care you need, when you need it.     Your next appointment:   12 month(s)  The format for your next appointment:   In Person  Provider:   DGlenetta Hew MD     Studies Ordered:   Orders Placed This Encounter  Procedures  . EKG 12-Lead     Gregory Mathews M.D., M.S. Interventional Cardiologist   Pager # [phone] # [phone] [address] [place] [id number]  [facility] you [facility]

## 2020-09-04 DIAGNOSIS — G720 Drug-induced myopathy: Secondary | ICD-10-CM | POA: Insufficient documentation

## 2020-09-04 DIAGNOSIS — T466X5A Adverse effect of antihyperlipidemic and antiarteriosclerotic drugs, initial encounter: Secondary | ICD-10-CM | POA: Insufficient documentation

## 2020-09-12 ENCOUNTER — Encounter: Payer: Self-pay | Admitting: Physician Assistant

## 2020-09-14 ENCOUNTER — Other Ambulatory Visit: Payer: Self-pay | Admitting: Emergency Medicine

## 2020-09-14 ENCOUNTER — Other Ambulatory Visit: Payer: Self-pay | Admitting: Cardiology

## 2020-09-14 DIAGNOSIS — E1165 Type 2 diabetes mellitus with hyperglycemia: Secondary | ICD-10-CM

## 2020-09-18 ENCOUNTER — Encounter: Payer: Self-pay | Admitting: Cardiology

## 2020-09-18 NOTE — Assessment & Plan Note (Signed)
Significant intolerance of statins.  Was not even able to tolerate low-dose pravastatin after having tried atorvastatin and rosuvastatin.  Therefore no longer on statin.  Lipids being managed with PCSK9 inhibitor.

## 2020-09-18 NOTE — Assessment & Plan Note (Signed)
No further angina after his LAD-PCI.  He had borderline symptoms at that time and was somewhat concerned about monitoring.  Plan:  Continue low-dose beta-blocker now with increasing dose of ARB  Not on statin because of myopathy-on Praluent  On maintenance dose clopidogrel because of proximal LAD stent  Okay to hold clopidogrel 5 days preop for surgeries or procedures.

## 2020-09-18 NOTE — Assessment & Plan Note (Signed)
Pretty mild aortic stenosis with mean gradient only 12 mmHg back in January of this year.  At this point would follow-up likely in 3 years, unless symptoms warrant.

## 2020-09-18 NOTE — Assessment & Plan Note (Addendum)
He has been on atorvastatin, simvastatin and rosuvastatin as well as pravastatin and Vascepa.  All discontinued due to myopathy and severe myalgias.  Target LDL should be less than 55.  Plan: Continue Praluent.  Unfortunately, he was off of it for couple months and therefore his lipids are not likely as well-controlled as they should be.  Not currently on anything besides glipizide for diabetes, would consider the possibility of at least Metformin plus/minus SGLT2 inhibitor  His lipids have been followed closely by his PCP.  Should be due to be checked at least April timeframe.

## 2020-09-18 NOTE — Assessment & Plan Note (Signed)
Blood pressure has been well controlled, but high today.  Plan: Increase losartan to 50 mg daily.  Continue Toprol at 25 mg (unable to titrate further because of bradycardia) Maintain blood pressure log.

## 2020-09-18 NOTE — Assessment & Plan Note (Signed)
Continue aggressive lipid management. 

## 2020-10-04 ENCOUNTER — Ambulatory Visit (INDEPENDENT_AMBULATORY_CARE_PROVIDER_SITE_OTHER): Payer: Medicare Other | Admitting: Physician Assistant

## 2020-10-04 ENCOUNTER — Encounter: Payer: Self-pay | Admitting: Physician Assistant

## 2020-10-04 ENCOUNTER — Telehealth: Payer: Self-pay

## 2020-10-04 VITALS — BP 130/64 | HR 75 | Ht 69.0 in | Wt 213.0 lb

## 2020-10-04 DIAGNOSIS — D126 Benign neoplasm of colon, unspecified: Secondary | ICD-10-CM

## 2020-10-04 DIAGNOSIS — Z7901 Long term (current) use of anticoagulants: Secondary | ICD-10-CM

## 2020-10-04 DIAGNOSIS — Z800 Family history of malignant neoplasm of digestive organs: Secondary | ICD-10-CM

## 2020-10-04 MED ORDER — NA SULFATE-K SULFATE-MG SULF 17.5-3.13-1.6 GM/177ML PO SOLN: 1.0000 | Freq: Once | ORAL | 0 refills | Status: AC

## 2020-10-04 NOTE — Telephone Encounter (Signed)
[facility] Pre-operative Risk Assessment     Request for surgical clearance:     Endoscopy Procedure  What type of surgery is being performed?     Colonocopy  When is this surgery scheduled?     11/13/20  What type of clearance is required ?   Pharmacy  Are there any medications that need to be held prior to surgery and how long? Plavix 5 days   Practice name and name of physician performing surgery?      [facility]  What is your office phone and fax number?      Phone- [phone]  [phone]  Anesthesia type (None, local, MAC, general) ?       MAC

## 2020-10-04 NOTE — Progress Notes (Signed)
Chief Complaint: History of adenomatous polyps, family history of colon cancer, chronic anticoagulation  HPI:    [name] is a 75 year old Caucasian male with a past medical history of CAD status post stenting in 2019 on Plavix, previously known to Dr. Deatra Ina, who was referred to me by Horald Pollen, * for a complaint of history of adenomatous polyps and family history of colon cancer.      02/25/2014 colonoscopy with Dr. Deatra Ina with 2 sessile polyps, mild diverticulosis and internal hemorrhoids.  Pathology showed a sessile serrated polyp and a tubular adenoma.    Today, the patient presents to clinic and tells me he is doing perfectly well but is aware that it is time for his colonoscopy.  Denies any GI problems.    Denies fever, chills, weight loss, heartburn, reflux or change in bowel habits.  Past Medical History:  Diagnosis Date  . Allergy   . Basal cell carcinoma 01/2013   Nasal and R forearm.  [facility]  . CAD S/P percutaneous coronary angioplasty    2/19 PCI/DESx1 to mLAD, FFR 0.7 -Sierra DES 2.75 mm x 18 mm  . Cataract   . Diabetes mellitus without complication (HCC)    on PO Meds  . Essential hypertension   . Heart murmur   . Hyperlipidemia    statin intolerant -- myalgias, fatigue (tried at least 4)  . Rosacea     Past Surgical History:  Procedure Laterality Date  . basal cell removal     nose and wrist   . BELPHAROPTOSIS REPAIR    . CORONARY CT ANGIOGRAM  09/2017   Coronary Calcium Score: 111. Coronary calcification noted in the LEFT MAIN (LM) and prox LAD.  LM < 50% calcified stenosis.  Mid LAD 50-75% plaque noted CT FFR --> positive at 0.80.  Recommend CARDIAC CATHETERIZATION  . CORONARY STENT INTERVENTION N/A 11/12/2017   Procedure: CORONARY STENT INTERVENTION;  Surgeon: Leonie Man, MD;  Location: [place] [facility];  Service: Cardiovascular:  DES PCI:  STENT SIERRA 2.75 X 18 MM - Post intervention, there is a 0% residual stenosis.   Marland Kitchen ETT/GXT: Graded Exercise Tolerance Test  08/2015    Exercised for 9:01 min --> blunted blood pressure response no EKG changes.  No chest pain.  Low Risk  . EYE SURGERY     Cataract surgery  . eyelid surgery    . INTRAVASCULAR PRESSURE WIRE/FFR STUDY N/A 11/12/2017   Procedure: INTRAVASCULAR PRESSURE WIRE/FFR STUDY;  Surgeon: Leonie Man, MD;  Location: [place] [facility];  Service: Cardiovascular;  Laterality: LAD -FFR 0.76  . LEFT HEART CATH AND CORONARY ANGIOGRAPHY N/A 11/12/2017   Procedure: LEFT HEART CATH AND CORONARY ANGIOGRAPHY;  Surgeon: Leonie Man, MD;  Location: [facility]: 70% P-M LAD (FFR 0.76).  40% mid LAD, 50% ostial ramus.  EF 55-60%.  Mildly elevated LVEDP.  Marland Kitchen SHOULDER OPEN ROTATOR CUFF REPAIR  1983  . TRANSTHORACIC ECHOCARDIOGRAM  08/2017   EF 55-60%.  Mild aortic stenosis (mean gradient 11 mmH.)  GR 1 DD.  Mild LA dilation.    Current Outpatient Medications  Medication Sig Dispense Refill  . blood glucose meter kit and supplies Use to test blood sugar daily. Dx: E11.9 1 each 0  . clopidogrel (PLAVIX) 75 MG tablet TAKE 1 TABLET BY MOUTH EVERY DAY 90 tablet 0  . glipiZIDE (GLUCOTROL) 5 MG tablet TAKE 1 TABLET (5 MG TOTAL) BY MOUTH 2 (TWO) TIMES DAILY BEFORE A  MEAL. 180 tablet 1  . Lancets MISC Use to test blood sugar daily. Dx code :E11.9 100 each 3  . losartan (COZAAR) 50 MG tablet Take 1 tablet (50 mg total) by mouth daily. 90 tablet 3  . metoprolol succinate (TOPROL-XL) 25 MG 24 hr tablet Take 1 tablet (25 mg total) by mouth daily. 90 tablet 3  . Multiple Vitamin (MULTIVITAMIN) tablet Take 1 tablet by mouth daily.    Glory Rosebush ULTRA test strip USE TO TEST BLOOD SUGAR ONCE DAILY 100 strip 11  . PRALUENT 150 MG/ML SOAJ INJECT 150 MG INTO THE SKIN EVERY 14 (FOURTEEN) DAYS. 2 mL 11   No current facility-administered medications for this visit.    Allergies as of 10/04/2020 - Review Complete 10/04/2020  Allergen Reaction Noted  . Januvia  [sitagliptin] Other (See Comments) 10/01/2018  . Lisinopril Cough 09/11/2017  . Statins Other (See Comments) 08/30/2016    Family History  Problem Relation Age of Onset  . Leukemia Mother   . Stroke Father 42       as a complication of CABG  . Colon cancer Father 47  . Cancer Father 24       Colon cancer  . Coronary artery disease Father 12       - Sx was DOE - MV CAD  . Peripheral Artery Disease Father        presumptive  . Diabetes Sister   . Sudden Cardiac Death Sister 86       presumed MI  . Heart attack Brother 81       During throat surgery  . Congestive Heart Failure Brother        Died @ 42 - ? CHF / ICM  . Cancer Maternal Grandmother   . Stroke Maternal Grandfather   . Diabetes Brother 64       on lots of meds  . Diabetes Brother   . Cancer Brother        colon cancer  . Rheumatic fever Brother        childhood RF --> Valvular Dz - valve Sgx,  died young    Social History   Socioeconomic History  . Marital status: Married    Spouse name: [name]  . Number of children: 4  . Years of education: 63  . Highest education level: Not on file  Occupational History  . Occupation: bus Engineer, manufacturing systems: [employer]  Tobacco Use  . Smoking status: Former Smoker    Packs/day: 1.00    Years: 15.00    Pack years: 15.00    Quit date: 07/09/1982    Years since quitting: 38.2  . Smokeless tobacco: Never Used  Substance and Sexual Activity  . Alcohol use: No  . Drug use: No  . Sexual activity: Yes    Partners: Female  Other Topics Concern  . Not on file  Social History Narrative   Marital status: married x 4 years; happily married.   Lives: with wife       Children:  4 children; 4 grandchildren. Adult children all live locally.          Employment: retired from bus transportation; PRN driving now activity bus. [employer]  Chicken farming.      Tobacco:  In past; smoked x 15 years; quit 35 years ago.      Alcohol: none       ADLs: independent with all ADLs.  Does not use assistant device with ambulation.  Living Will:  Has living will; desires FULL CODE; no prolonged measures.      Doctoral degree in Biblical Studies.          Diet: eats twice daily, eggs,sausage,and fruits for breakfast; chicken (fried or broil), occassional pizza, salads, steak and pork chops.       Exercise: cardiac rehab, transition to [facility] - 45 minutes walks and resistance 5x/week   Social Determinants of Health   Financial Resource Strain: Not on file  Food Insecurity: Not on file  Transportation Needs: Not on file  Physical Activity: Not on file  Stress: Not on file  Social Connections: Not on file  Intimate Partner Violence: Not on file    Review of Systems:    Constitutional: No weight loss, fever or chills Skin: No rash Cardiovascular: No chest pain Respiratory: No SOB Gastrointestinal: See HPI and otherwise negative Genitourinary: No dysuria Neurological: No headache, dizziness or syncope Musculoskeletal: No new muscle or joint pain Hematologic: No bleeding  Psychiatric: No history of depression or anxiety   Physical Exam:  Vital signs: BP 130/64   Pulse 75   Ht '5\' 9"'  (1.753 m)   Wt 213 lb (96.6 kg)   BMI 31.45 kg/m   Constitutional:   Pleasant Caucasian male appears to be in NAD, Well developed, Well nourished, alert and cooperative Respiratory: Respirations even and unlabored. Lungs clear to auscultation bilaterally.   No wheezes, crackles, or rhonchi.  Cardiovascular: Normal S1, S2. No MRG. Regular rate and rhythm. No peripheral edema, cyanosis or pallor.  Gastrointestinal:  Soft, nondistended, nontender. No rebound or guarding. Normal bowel sounds. No appreciable masses or hepatomegaly. Rectal:  Not performed.  Psychiatric: Demonstrates good judgement and reason without abnormal affect or behaviors.  RELEVANT LABS AND IMAGING: CBC    Component Value Date/Time   WBC 8.5 11/18/2019 1013   WBC 8.2  08/14/2016 0846   RBC 4.26 11/18/2019 1013   RBC 4.41 08/14/2016 0846   HGB 13.3 11/18/2019 1013   HCT 39.2 11/18/2019 1013   PLT 210 11/18/2019 1013   MCV 92 11/18/2019 1013   MCH 31.2 11/18/2019 1013   MCH 30.6 08/14/2016 0846   MCHC 33.9 11/18/2019 1013   MCHC 33.6 08/14/2016 0846   RDW 13.0 11/18/2019 1013   LYMPHSABS 2.5 11/18/2019 1013   MONOABS 574 08/14/2016 0846   EOSABS 0.3 11/18/2019 1013   BASOSABS 0.1 11/18/2019 1013    CMP     Component Value Date/Time   NA 141 05/17/2020 1049   K 4.5 05/17/2020 1049   CL 103 05/17/2020 1049   CO2 22 05/17/2020 1049   GLUCOSE 128 (H) 05/17/2020 1049   GLUCOSE 113 (H) 08/14/2016 0846   BUN 22 05/17/2020 1049   CREATININE 0.94 05/17/2020 1049   CREATININE 0.79 08/14/2016 0846   CALCIUM 10.3 (H) 05/17/2020 1049   PROT 7.3 05/17/2020 1049   ALBUMIN 4.8 (H) 05/17/2020 1049   AST 38 05/17/2020 1049   ALT 36 05/17/2020 1049   ALKPHOS 62 05/17/2020 1049   BILITOT 0.8 05/17/2020 1049   GFRNONAA 81 05/17/2020 1049   GFRNONAA 81 08/01/2015 1457   GFRAA 93 05/17/2020 1049   GFRAA >89 08/01/2015 1457    Assessment: 1.  History of adenomatous polyps: Last colonoscopy 02/25/2014 with sessile serrated polyps, recommendations to repeat in 5 years given family history of colon cancer 2.  Chronic anticoagulation status post stenting for CAD: On Plavix 3.  Family history of colon cancer in his father  Plan:  1.  Scheduled patient for a colonoscopy in the [place].  He was scheduled Dr. Tarri Glenn as she had availability on the very specific day that the patient requested.  Patient provided with a detailed handout regarding risks of the procedure and he agrees to proceed.  He will be COVID tested 2 days prior to time of procedure. 2.  Patient was advised to hold his Plavix for 5 days prior to time of procedure.  We will communicate with his prescribing physician Dr. Wilhemina Cash to ensure this is acceptable for him. 3.  Patient to follow in clinic per  recommendations from Dr. Tarri Glenn after time of procedure.  Ellouise Newer, PA-C [facility] 10/04/2020, 2:49 PM  Cc: Horald Pollen, *

## 2020-10-04 NOTE — Progress Notes (Signed)
Reviewed and agree with management plans. ? ?Lestat Golob L. Leshay Desaulniers, MD, MPH  ?

## 2020-10-04 NOTE — Patient Instructions (Signed)
If you are age 73 or older, your body mass index should be between 23-30. Your Body mass index is 31.45 kg/m. If this is out of the aforementioned range listed, please consider follow up with your Primary Care Provider.  If you are age 64 or younger, your body mass index should be between 19-25. Your Body mass index is 31.45 kg/m. If this is out of the aformentioned range listed, please consider follow up with your Primary Care Provider.   You have been scheduled for a colonoscopy. Please follow written instructions given to you at your visit today.  Please pick up your prep supplies at the pharmacy within the next 1-3 days. If you use inhalers (even only as needed), please bring them with you on the day of your procedure.  Thank you for choosing me and [facility].  Jennifer Lemmon , PA-C   

## 2020-10-04 NOTE — Telephone Encounter (Signed)
   Primary Cardiologist: Glenetta Hew, MD  Chart reviewed as part of pre-operative protocol coverage. Patient was contacted 10/04/2020 in reference to pre-operative risk assessment for pending surgery as outlined below.  Gregory Dashiel Bergquist. was last seen on 08/31/2020 by Dr. Ellyn Hack.  Since that day, Gregory Mackinley Kiehn. has done well without chest pain or shortness of breath. He may hold plavix for 5 days prior to the procedure and restart as soon as possible after the procedure at the GI doctor's discretion.   Therefore, based on ACC/AHA guidelines, the patient would be at acceptable risk for the planned procedure without further cardiovascular testing.   The patient was advised that if he develops new symptoms prior to surgery to contact our office to arrange for a follow-up visit, and he verbalized understanding.  I will route this recommendation to the requesting party via Epic fax function and remove from pre-op pool. Please call with questions.  [place], [place] 10/04/2020, 4:04 PM

## 2020-10-05 NOTE — Telephone Encounter (Signed)
Called patient and let him know that it was ok to hold Plavix 5 days prior to procedure. Patient verbalized understanding.

## 2020-11-08 ENCOUNTER — Other Ambulatory Visit: Payer: Self-pay | Admitting: Emergency Medicine

## 2020-11-08 DIAGNOSIS — I10 Essential (primary) hypertension: Secondary | ICD-10-CM

## 2020-11-08 DIAGNOSIS — Z955 Presence of coronary angioplasty implant and graft: Secondary | ICD-10-CM

## 2020-11-11 ENCOUNTER — Other Ambulatory Visit (HOSPITAL_COMMUNITY)
Admission: RE | Admit: 2020-11-11 | Discharge: 2020-11-11 | Disposition: A | Discharge status: Home / Self Care | Payer: Medicare Other | Source: Ambulatory Visit | Attending: Gastroenterology | Admitting: Gastroenterology

## 2020-11-11 DIAGNOSIS — Z20822 Contact with and (suspected) exposure to covid-19: Secondary | ICD-10-CM | POA: Insufficient documentation

## 2020-11-11 DIAGNOSIS — Z01812 Encounter for preprocedural laboratory examination: Secondary | ICD-10-CM | POA: Insufficient documentation

## 2020-11-11 LAB — SARS CORONAVIRUS 2 (TAT 6-24 HRS): SARS Coronavirus 2: NEGATIVE

## 2020-11-13 ENCOUNTER — Encounter: Payer: Self-pay | Admitting: Gastroenterology

## 2020-11-13 ENCOUNTER — Ambulatory Visit (AMBULATORY_SURGERY_CENTER): Payer: Medicare Other | Admitting: Gastroenterology

## 2020-11-13 ENCOUNTER — Other Ambulatory Visit: Payer: Self-pay

## 2020-11-13 VITALS — BP 109/55 | HR 58 | Temp 97.3°F | Resp 18 | Ht 69.0 in | Wt 213.0 lb

## 2020-11-13 DIAGNOSIS — D122 Benign neoplasm of ascending colon: Secondary | ICD-10-CM | POA: Diagnosis not present

## 2020-11-13 DIAGNOSIS — D127 Benign neoplasm of rectosigmoid junction: Secondary | ICD-10-CM | POA: Diagnosis not present

## 2020-11-13 DIAGNOSIS — D128 Benign neoplasm of rectum: Secondary | ICD-10-CM | POA: Diagnosis not present

## 2020-11-13 DIAGNOSIS — Z800 Family history of malignant neoplasm of digestive organs: Secondary | ICD-10-CM | POA: Diagnosis not present

## 2020-11-13 DIAGNOSIS — D123 Benign neoplasm of transverse colon: Secondary | ICD-10-CM | POA: Diagnosis not present

## 2020-11-13 DIAGNOSIS — Z86010 Personal history of colonic polyps: Secondary | ICD-10-CM | POA: Diagnosis not present

## 2020-11-13 DIAGNOSIS — D125 Benign neoplasm of sigmoid colon: Secondary | ICD-10-CM | POA: Diagnosis not present

## 2020-11-13 MED ORDER — SODIUM CHLORIDE 0.9 % IV SOLN: 500.0000 mL | Freq: Once | INTRAVENOUS | Status: DC

## 2020-11-13 NOTE — Op Note (Signed)
[place] Patient Name: Gregory Mathews Procedure Date: 11/13/2020 3:28 PM MRN: [id number] Endoscopist: Thornton Park MD, MD Age: 75 Referring MD:  Date of Birth: 07/21/1946 Gender: Male Account #: [id number] Procedure:                Colonoscopy Indications:              Screening patient at increased risk: Family history                            of 1st-degree relative with colorectal cancer at                            age 24 years (or older), Surveillance: Personal                            history of adenomatous polyps on last colonoscopy >                            5 years ago                           Colonoscop 2015 revealed a SSP and tubular adenoma,                            surveillance colonoscopy in 63 years                           Father with colon cancer at age 56                           No other known family history of colon cancer or                            polyps Medicines:                Monitored Anesthesia Care Procedure:                Pre-Anesthesia Assessment:                           - Prior to the procedure, a History and Physical                            was performed, and patient medications and                            allergies were reviewed. The patient's tolerance of                            previous anesthesia was also reviewed. The risks                            and benefits of the procedure and the sedation  options and risks were discussed with the patient.                            All questions were answered, and informed consent                            was obtained. Prior Anticoagulants: The patient has                            taken Plavix (clopidogrel), last dose was 5 days                            prior to procedure. ASA Grade Assessment: II - A                            patient with mild systemic disease. After reviewing                            the risks and benefits,  the patient was deemed in                            satisfactory condition to undergo the procedure.                           After obtaining informed consent, the colonoscope                            was passed under direct vision. Throughout the                            procedure, the patient's blood pressure, pulse, and                            oxygen saturations were monitored continuously. The                            Olympus CF-HQ190 [id number]) Colonoscope was                            introduced through the anus and advanced to the 3                            cm into the ileum. The colonoscopy was performed                            without difficulty. The patient tolerated the                            procedure well. The quality of the bowel                            preparation was good. The terminal ileum, ileocecal  valve, appendiceal orifice, and rectum were                            photographed. Scope In: 3:39:37 PM Scope Out: 4:04:55 PM Scope Withdrawal Time: 0 hours 21 minutes 30 seconds  Total Procedure Duration: 0 hours 25 minutes 18 seconds  Findings:                 The perianal and digital rectal examinations were                            normal.                           Non-bleeding internal hemorrhoids were found.                           A few small and large-mouthed diverticula were                            found in the sigmoid colon.                           A 4 mm polyp was found in the rectum. The polyp was                            flat. The polyp was removed with a cold snare.                            Resection and retrieval were complete. Estimated                            blood loss was minimal.                           Three sessile polyps were found in the rectum. The                            polyps were 1 to 2 mm in size. These polyps were                            removed with a cold snare.  Resection and retrieval                            were complete. Estimated blood loss was minimal.                           A 3 mm polyp was found in the sigmoid colon. The                            polyp was sessile. The polyp was removed with a                            cold snare. Resection and retrieval were complete.  Estimated blood loss was minimal.                           A 3 mm polyp was found in the splenic flexure. The                            polyp was sessile. The polyp was removed with a                            cold snare. Resection and retrieval were complete.                            Estimated blood loss was minimal.                           A 3 mm polyp was found in the transverse colon. The                            polyp was flat. The polyp was removed with a cold                            snare. Resection and retrieval were complete.                            Estimated blood loss was minimal.                           Five sessile polyps were found in the ascending                            colon. The polyps were 1 to 2 mm in size. These                            polyps were removed with a cold snare. Resection                            and retrieval were complete. Estimated blood loss                            was minimal.                           The exam was otherwise without abnormality on                            direct and retroflexion views. Complications:            No immediate complications. Estimated blood loss:                            Minimal. Estimated Blood Loss:     Estimated blood loss was minimal. Impression:               - Non-bleeding internal hemorrhoids.                           -  Diverticulosis in the sigmoid colon.                           - One 4 mm polyp in the rectum, removed with a cold                            snare. Resected and retrieved.                           - Three 1 to 2 mm  polyps in the rectum, removed                            with a cold snare. Resected and retrieved.                           - One 3 mm polyp in the sigmoid colon, removed with                            a cold snare. Resected and retrieved.                           - One 3 mm polyp at the splenic flexure, removed                            with a cold snare. Resected and retrieved.                           - One 3 mm polyp in the transverse colon, removed                            with a cold snare. Resected and retrieved.                           - Five 1 to 2 mm polyps in the ascending colon,                            removed with a cold snare. Resected and retrieved.                           - The examination was otherwise normal on direct                            and retroflexion views. Recommendation:           - Patient has a contact number available for                            emergencies. The signs and symptoms of potential                            delayed complications were discussed with the  patient. Return to normal activities tomorrow.                            Written discharge instructions were provided to the                            patient.                           - Follow a high fiber diet. Drink at least 64                            ounces of water daily. Add a daily stool bulking                            agent such as psyllium (an exampled would be                            Metamucil).                           - Resume Plavix (clopidogrel) at prior dose 11/15/20.                           - Await pathology results.                           - Repeat colonoscopy date to be determined after                            pending pathology results are reviewed for                            surveillance.                           - Emerging evidence supports eating a diet of                            fruits, vegetables,  grains, calcium, and yogurt                            while reducing red meat and alcohol may reduce the                            risk of colon cancer.                           - Thank you for allowing me to be involved in your                            colon cancer prevention. Thornton Park MD, MD 11/13/2020 4:17:14 PM This report has been signed electronically.

## 2020-11-13 NOTE — Progress Notes (Signed)
Called to room to assist during endoscopic procedure.  Patient ID and intended procedure confirmed with present staff. Received instructions for my participation in the procedure from the performing physician.  

## 2020-11-13 NOTE — Patient Instructions (Addendum)
Information on polyps, hemorrhoids and diverticulosis given to you today.  Await pathology results.  Resume previous diet and medications.  Resume Plavix at prior dose on Wednesday, Feb 23.    YOU HAD AN ENDOSCOPIC PROCEDURE TODAY AT [facility]:   Refer to the procedure report that was given to you for any specific questions about what was found during the examination.  If the procedure report does not answer your questions, please call your gastroenterologist to clarify.  If you requested that your care partner not be given the details of your procedure findings, then the procedure report has been included in a sealed envelope for you to review at your convenience later.  YOU SHOULD EXPECT: Some feelings of bloating in the abdomen. Passage of more gas than usual.  Walking can help get rid of the air that was put into your GI tract during the procedure and reduce the bloating. If you had a lower endoscopy (such as a colonoscopy or flexible sigmoidoscopy) you may notice spotting of blood in your stool or on the toilet paper. If you underwent a bowel prep for your procedure, you may not have a normal bowel movement for a few days.  Please Note:  You might notice some irritation and congestion in your nose or some drainage.  This is from the oxygen used during your procedure.  There is no need for concern and it should clear up in a day or so.  SYMPTOMS TO REPORT IMMEDIATELY:   Following lower endoscopy (colonoscopy or flexible sigmoidoscopy):  Excessive amounts of blood in the stool  Significant tenderness or worsening of abdominal pains  Swelling of the abdomen that is new, acute  Fever of 100F or higher   For urgent or emergent issues, a gastroenterologist can be reached at any hour by calling [phone]. Do not use MyChart messaging for urgent concerns.    DIET:  We do recommend a small meal at first, but then you may proceed to your regular diet.  Drink plenty of  fluids but you should avoid alcoholic beverages for 24 hours.  ACTIVITY:  You should plan to take it easy for the rest of today and you should NOT DRIVE or use heavy machinery until tomorrow (because of the sedation medicines used during the test).    FOLLOW UP: Our staff will call the number listed on your records 48-72 hours following your procedure to check on you and address any questions or concerns that you may have regarding the information given to you following your procedure. If we do not reach you, we will leave a message.  We will attempt to reach you two times.  During this call, we will ask if you have developed any symptoms of COVID 19. If you develop any symptoms (ie: fever, flu-like symptoms, shortness of breath, cough etc.) before then, please call [phone].  If you test positive for Covid 19 in the 2 weeks post procedure, please call and report this information to [place].    If any biopsies were taken you will be contacted by phone or by letter within the next 1-3 weeks.  Please call us at [phone] if you have not heard about the biopsies in 3 weeks.    SIGNATURES/CONFIDENTIALITY: You and/or your care partner have signed paperwork which will be entered into your electronic medical record.  These signatures attest to the fact that that the information above on your After Visit Summary has been reviewed and is understood.  Full responsibility of the confidentiality of this discharge information lies with you and/or your care-partner.

## 2020-11-13 NOTE — Progress Notes (Signed)
Report to PACU, RN, vss, BBS= Clear.  

## 2020-11-15 ENCOUNTER — Telehealth: Payer: Self-pay

## 2020-11-15 ENCOUNTER — Telehealth: Payer: Self-pay | Admitting: *Deleted

## 2020-11-15 NOTE — Telephone Encounter (Signed)
No answer for post procedure call back. Left message for patient to call with questions or concerns. 

## 2020-11-15 NOTE — Telephone Encounter (Signed)
Attempted to reach patient for post-procedure f/u call. No answer. Left message that we will make another attempt to reach him again later today and for him to please not hesitate to call us if he has any questions/concerns regarding his care. 

## 2020-11-16 ENCOUNTER — Other Ambulatory Visit: Payer: Self-pay

## 2020-11-16 ENCOUNTER — Encounter: Payer: Self-pay | Admitting: Emergency Medicine

## 2020-11-16 ENCOUNTER — Ambulatory Visit (INDEPENDENT_AMBULATORY_CARE_PROVIDER_SITE_OTHER): Payer: Medicare Other | Admitting: Emergency Medicine

## 2020-11-16 VITALS — BP 133/72 | HR 57 | Temp 98.1°F | Resp 16 | Ht 70.0 in | Wt 211.0 lb

## 2020-11-16 DIAGNOSIS — D689 Coagulation defect, unspecified: Secondary | ICD-10-CM

## 2020-11-16 DIAGNOSIS — E1159 Type 2 diabetes mellitus with other circulatory complications: Secondary | ICD-10-CM

## 2020-11-16 DIAGNOSIS — I2510 Atherosclerotic heart disease of native coronary artery without angina pectoris: Secondary | ICD-10-CM

## 2020-11-16 DIAGNOSIS — I152 Hypertension secondary to endocrine disorders: Secondary | ICD-10-CM | POA: Diagnosis not present

## 2020-11-16 DIAGNOSIS — G720 Drug-induced myopathy: Secondary | ICD-10-CM | POA: Diagnosis not present

## 2020-11-16 DIAGNOSIS — E785 Hyperlipidemia, unspecified: Secondary | ICD-10-CM

## 2020-11-16 DIAGNOSIS — E1169 Type 2 diabetes mellitus with other specified complication: Secondary | ICD-10-CM

## 2020-11-16 DIAGNOSIS — I25119 Atherosclerotic heart disease of native coronary artery with unspecified angina pectoris: Secondary | ICD-10-CM

## 2020-11-16 DIAGNOSIS — T466X5A Adverse effect of antihyperlipidemic and antiarteriosclerotic drugs, initial encounter: Secondary | ICD-10-CM | POA: Diagnosis not present

## 2020-11-16 DIAGNOSIS — E1165 Type 2 diabetes mellitus with hyperglycemia: Secondary | ICD-10-CM | POA: Diagnosis not present

## 2020-11-16 LAB — CMP14+EGFR
ALT: 26 IU/L (ref 0–44)
AST: 24 IU/L (ref 0–40)
Albumin/Globulin Ratio: 1.8 (ref 1.2–2.2)
Albumin: 4.6 g/dL (ref 3.7–4.7)
Alkaline Phosphatase: 63 IU/L (ref 44–121)
BUN/Creatinine Ratio: 18 (ref 10–24)
BUN: 16 mg/dL (ref 8–27)
Bilirubin Total: 0.6 mg/dL (ref 0.0–1.2)
CO2: 22 mmol/L (ref 20–29)
Calcium: 10.2 mg/dL (ref 8.6–10.2)
Chloride: 103 mmol/L (ref 96–106)
Creatinine, Ser: 0.9 mg/dL (ref 0.76–1.27)
GFR calc Af Amer: 98 mL/min/{1.73_m2} (ref >59)
GFR calc non Af Amer: 85 mL/min/{1.73_m2} (ref >59)
Globulin, Total: 2.6 g/dL (ref 1.5–4.5)
Glucose: 130 mg/dL — ABNORMAL HIGH (ref 65–99)
Potassium: 4.7 mmol/L (ref 3.5–5.2)
Sodium: 138 mmol/L (ref 134–144)
Total Protein: 7.2 g/dL (ref 6.0–8.5)

## 2020-11-16 LAB — LIPID PANEL
Chol/HDL Ratio: 2.1 ratio (ref 0.0–5.0)
Cholesterol, Total: 86 mg/dL — ABNORMAL LOW (ref 100–199)
HDL: 41 mg/dL (ref >39)
LDL Chol Calc (NIH): 24 mg/dL (ref 0–99)
Triglycerides: 115 mg/dL (ref 0–149)
VLDL Cholesterol Cal: 21 mg/dL (ref 5–40)

## 2020-11-16 LAB — POCT GLYCOSYLATED HEMOGLOBIN (HGB A1C): Hemoglobin A1C: 7.5 % — AB (ref 4.0–5.6)

## 2020-11-16 LAB — GLUCOSE, POCT (MANUAL RESULT ENTRY): POC Glucose: 143 mg/dl — AB (ref 70–99)

## 2020-11-16 MED ORDER — LOSARTAN POTASSIUM 50 MG PO TABS: 50.0000 mg | ORAL_TABLET | Freq: Every day | ORAL | 3 refills | Status: DC

## 2020-11-16 MED ORDER — EMPAGLIFLOZIN 10 MG PO TABS: 10.0000 mg | ORAL_TABLET | Freq: Every day | ORAL | 3 refills | Status: DC

## 2020-11-16 MED ORDER — METOPROLOL SUCCINATE ER 25 MG PO TB24: 25.0000 mg | ORAL_TABLET | Freq: Every day | ORAL | 3 refills | Status: DC

## 2020-11-16 MED ORDER — GLIPIZIDE 5 MG PO TABS: 5.0000 mg | ORAL_TABLET | Freq: Two times a day (BID) | ORAL | 3 refills | Status: DC

## 2020-11-16 NOTE — Progress Notes (Signed)
Gregory Mathews. 75 y.o.   Chief Complaint  Patient presents with  . Diabetes    Follow up 6 month    HISTORY OF PRESENT ILLNESS: This is a 75 y.o. male with history of diabetes, hypertension, coronary artery disease, and dyslipidemia here for follow-up. Doing well.  Has no complaints or medical concerns today. Eating better and exercising more. Had colonoscopy done last Monday.  Multiple polyps.  Some diverticulosis.  Otherwise normal exam.  Pathology pending. Last visit with his cardiologist last December.  Office visit notes reviewed.  Stable.  On Plavix, metoprolol succinate, and losartan. Lab Results  Component Value Date   HGBA1C 7.4 (A) 05/17/2020   BP Readings from Last 3 Encounters:  11/16/20 133/72  11/13/20 (!) 109/55  10/04/20 130/64   Lab Results  Component Value Date   CHOL 172 07/13/2020   HDL 36 (L) 07/13/2020   LDLCALC 86 07/13/2020   TRIG 306 (H) 07/13/2020   CHOLHDL 4.8 07/13/2020    HPI   Prior to Admission medications   Medication Sig Start Date End Date Taking? Authorizing Provider  clopidogrel (PLAVIX) 75 MG tablet TAKE 1 TABLET BY MOUTH EVERY DAY 11/08/20  Yes Sagardia, Ines Bloomer, MD  empagliflozin (JARDIANCE) 10 MG TABS tablet Take 10 mg by mouth daily.   Yes [provider]  losartan (COZAAR) 50 MG tablet Take 1 tablet (50 mg total) by mouth daily. 08/31/20 11/29/20 Yes Leonie Man, MD  metoprolol succinate (TOPROL-XL) 25 MG 24 hr tablet Take 1 tablet (25 mg total) by mouth daily. 08/31/20  Yes Leonie Man, MD  Multiple Vitamin (MULTIVITAMIN) tablet Take 1 tablet by mouth daily.   Yes [provider]  PRALUENT 150 MG/ML SOAJ INJECT 150 MG INTO THE SKIN EVERY 14 (FOURTEEN) DAYS. 09/18/20  Yes Leonie Man, MD  blood glucose meter kit and supplies Use to test blood sugar daily. Dx: E11.9 10/11/15   Tereasa Coop, PA-C  glipiZIDE (GLUCOTROL) 5 MG tablet TAKE 1 TABLET (5 MG TOTAL) BY MOUTH 2 (TWO) TIMES DAILY BEFORE  A MEAL. 07/30/20 10/28/20  Horald Pollen, MD  Lancets MISC Use to test blood sugar daily. Dx code :E11.9 07/08/19   Horald Pollen, MD  [place] test strip USE TO TEST BLOOD SUGAR ONCE DAILY 09/14/20   Horald Pollen, MD    Allergies  Allergen Reactions  . Januvia [Sitagliptin] Other (See Comments)    Gi upset and kidney problems  . Lisinopril Cough  . Statins Other (See Comments)    Severe leg pain & fatigue -- tried at least 4 (even low dose)    Patient Active Problem List   Diagnosis Date Noted  . 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  . Clotting disorder ([place]) 05/17/2020  . Skin cancer, basal cell 11/18/2019  . Uncircumcised male 03/11/2018  . Coronary artery disease involving native heart without angina pectoris 11/04/2017  . Abnormal findings on diagnostic imaging of cardiovascular system 11/04/2017  . [place] coronary artery calcium score between 100 and 199 09/22/2017  . Family history of early CAD 09/11/2017  . Mild aortic stenosis by prior echocardiogram 09/11/2017  . Metabolic syndrome 03/18/9484  . 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 BMI 31.0-31.9,adult 03/13/2016  . Hypertension associated with diabetes ([place]) 08/29/2015  . Diverticulosis of colon without hemorrhage 08/01/2015  . Statin intolerance 08/01/2015  . Basal cell carcinoma of nose 08/23/2014  . Rosacea 08/23/2014  . Seasonal allergies 01/26/2013  . Type 2 diabetes mellitus with hyperglycemia,  without long-term current use of insulin ([place]) 07/09/2012  . Hyperlipidemia associated with type 2 diabetes mellitus ([place]) 07/09/2012  . Erectile dysfunction 07/09/2012    Past Medical History:  Diagnosis Date  . Allergy   . Basal cell carcinoma 01/2013   Nasal and R forearm.  [facility]  . CAD S/P percutaneous coronary angioplasty    2/19 PCI/DESx1 to mLAD, FFR 0.7 -[place] DES 2.75 mm x 18 mm  . Cataract   . Diabetes mellitus without complication (HCC)    on PO Meds  . Essential  hypertension   . Heart murmur   . Hyperlipidemia    statin intolerant -- myalgias, fatigue (tried at least 4)  . Rosacea     Past Surgical History:  Procedure Laterality Date  . basal cell removal     nose and wrist   . BELPHAROPTOSIS REPAIR    . CORONARY CT ANGIOGRAM  09/2017   Coronary Calcium Score: 111. Coronary calcification noted in the LEFT MAIN (LM) and prox LAD.  LM < 50% calcified stenosis.  Mid LAD 50-75% plaque noted CT FFR --> positive at 0.80.  Recommend CARDIAC CATHETERIZATION  . CORONARY STENT INTERVENTION N/A 11/12/2017   Procedure: CORONARY STENT INTERVENTION;  Surgeon: Leonie Man, MD;  Location: [place] [facility];  Service: Cardiovascular:  DES PCI:  STENT SIERRA 2.75 X 18 MM - Post intervention, there is a 0% residual stenosis.  Marland Kitchen ETT/GXT: Graded Exercise Tolerance Test  08/2015    Exercised for 9:01 min --> blunted blood pressure response no EKG changes.  No chest pain.  Low Risk  . EYE SURGERY     Cataract surgery  . eyelid surgery    . INTRAVASCULAR PRESSURE WIRE/FFR STUDY N/A 11/12/2017   Procedure: INTRAVASCULAR PRESSURE WIRE/FFR STUDY;  Surgeon: Leonie Man, MD;  Location: [place] [facility];  Service: Cardiovascular;  Laterality: LAD -FFR 0.76  . LEFT HEART CATH AND CORONARY ANGIOGRAPHY N/A 11/12/2017   Procedure: LEFT HEART CATH AND CORONARY ANGIOGRAPHY;  Surgeon: Leonie Man, MD;  Location: [facility]: 70% P-M LAD (FFR 0.76).  40% mid LAD, 50% ostial ramus.  EF 55-60%.  Mildly elevated LVEDP.  Marland Kitchen SHOULDER OPEN ROTATOR CUFF REPAIR  1983  . TRANSTHORACIC ECHOCARDIOGRAM  08/2017   EF 55-60%.  Mild aortic stenosis (mean gradient 11 mmH.)  GR 1 DD.  Mild LA dilation.    Social History   Socioeconomic History  . Marital status: Married    Spouse name: [name]  . Number of children: 4  . Years of education: 2  . Highest education level: Not on file  Occupational History  . Occupation: bus Engineer, manufacturing systems: [employer]  Tobacco Use  . Smoking status: Former Smoker    Packs/day: 1.00    Years: 15.00    Pack years: 15.00    Quit date: 07/09/1982    Years since quitting: 38.3  . Smokeless tobacco: Never Used  Substance and Sexual Activity  . Alcohol use: No  . Drug use: No  . Sexual activity: Yes    Partners: Female  Other Topics Concern  . Not on file  Social History Narrative   Marital status: married x 85 years; happily married.   Lives: with wife       Children:  4 children; 4 grandchildren. Adult children all live locally.          Employment: retired from bus transportation; PRN driving now activity bus.  [employer].  Chicken farming.      Tobacco:  In past; smoked x 15 years; quit 35 years ago.      Alcohol: none      ADLs: independent with all ADLs.  Does not use assistant device with ambulation.      Living Will:  Has living will; desires FULL CODE; no prolonged measures.      Doctoral degree in Biblical Studies.          Diet: eats twice daily, eggs,sausage,and fruits for breakfast; chicken (fried or broil), occassional pizza, salads, steak and pork chops.       Exercise: cardiac rehab, transition to [facility] - 45 minutes walks and resistance 5x/week   Social Determinants of Health   Financial Resource Strain: Not on file  Food Insecurity: Not on file  Transportation Needs: Not on file  Physical Activity: Not on file  Stress: Not on file  Social Connections: Not on file  Intimate Partner Violence: Not on file    Family History  Problem Relation Age of Onset  . Leukemia Mother   . Stroke Father 63       as a complication of CABG  . Colon cancer Father 86  . Cancer Father 39       Colon cancer  . Coronary artery disease Father 3       - Sx was DOE - MV CAD  . Peripheral Artery Disease Father        presumptive  . Diabetes Sister   . Sudden Cardiac Death Sister 53       presumed MI  . Heart attack Brother 81       During throat surgery  . Congestive  Heart Failure Brother        Died @ 63 - ? CHF / ICM  . Cancer Maternal Grandmother   . Stroke Maternal Grandfather   . Diabetes Brother 64       on lots of meds  . Diabetes Brother   . Cancer Brother        colon cancer  . Rheumatic fever Brother        childhood RF --> Valvular Dz - valve Sgx,  died young  . Stomach cancer Neg Hx   . Esophageal cancer Neg Hx      Review of Systems  Constitutional: Negative.  Negative for chills and fever.  HENT: Negative.  Negative for congestion and sore throat.   Respiratory: Negative.  Negative for cough and shortness of breath.   Cardiovascular: Negative.  Negative for chest pain and palpitations.  Gastrointestinal: Negative.  Negative for abdominal pain, diarrhea, nausea and vomiting.  Genitourinary: Negative.  Negative for dysuria and hematuria.  Skin: Negative.  Negative for rash.  Neurological: Negative.  Negative for dizziness and headaches.  All other systems reviewed and are negative.   Today's Vitals   11/16/20 0822  BP: 133/72  Pulse: (!) 57  Resp: 16  Temp: 98.1 F (36.7 C)  TempSrc: Temporal  SpO2: 95%  Weight: 211 lb (95.7 kg)  Height: '5\' 10"'  (1.778 m)   Body mass index is 30.28 kg/m. Wt Readings from Last 3 Encounters:  11/16/20 211 lb (95.7 kg)  11/13/20 213 lb (96.6 kg)  10/04/20 213 lb (96.6 kg)    Physical Exam Vitals reviewed.  Constitutional:      Appearance: Normal appearance.  HENT:     Head: Normocephalic.  Eyes:     Extraocular Movements: Extraocular movements intact.  Pupils: Pupils are equal, round, and reactive to light.  Cardiovascular:     Rate and Rhythm: Normal rate and regular rhythm.     Pulses: Normal pulses.     Heart sounds: Normal heart sounds.  Musculoskeletal:     Cervical back: Normal range of motion and neck supple.  Skin:    General: Skin is warm and dry.     Capillary Refill: Capillary refill takes less than 2 seconds.  Neurological:     General: No focal deficit  present.     Mental Status: He is alert and oriented to person, place, and time.  Psychiatric:        Mood and Affect: Mood normal.        Behavior: Behavior normal.    Results for orders placed or performed in visit on 11/16/20 (from the past 24 hour(s))  POCT glucose (manual entry)     Status: Abnormal   Collection Time: 11/16/20  8:32 AM  Result Value Ref Range   POC Glucose 143 (A) 70 - 99 mg/dl  POCT glycosylated hemoglobin (Hb A1C)     Status: Abnormal   Collection Time: 11/16/20  8:36 AM  Result Value Ref Range   Hemoglobin A1C 7.5 (A) 4.0 - 5.6 %   HbA1c POC (<> result, manual entry)     HbA1c, POC (prediabetic range)     HbA1c, POC (controlled diabetic range)       ASSESSMENT & PLAN: Hypertension associated with diabetes ([place]) Well-controlled hypertension.  Continue present medications.  No changes. Hemoglobin A1c at 7.5 about the same as before.  Presently taking 5 mg of glipizide twice a day and Jardiance 10 mg daily.  Blood sugars at home within normal limits. Eating better and exercising regularly.  Patient has been skipping second dose of glipizide occasionally.  He wants to stay on present regimen for the next 3 months. Diet and nutrition discussed. Follow-up in 3 months.  Hyperlipidemia associated with type 2 diabetes mellitus (HCC) Fasting lipid profile done today.  Intolerant to statins.  Patient on Praluent. Recent cardiology office visit notes reviewed.  [place] was seen today for diabetes.  Diagnoses and all orders for this visit:  Hypertension associated with diabetes ([place]) -     POCT glucose (manual entry) -     POCT glycosylated hemoglobin (Hb A1C) -     CMP14+EGFR -     losartan (COZAAR) 50 MG tablet; Take 1 tablet (50 mg total) by mouth daily.  Statin myopathy  Coronary artery disease involving native coronary artery of native heart without angina pectoris -     metoprolol succinate (TOPROL-XL) 25 MG 24 hr tablet; Take 1 tablet (25 mg total) by  mouth daily.  Type 2 diabetes mellitus with hyperglycemia, without long-term current use of insulin (HCC) -     empagliflozin (JARDIANCE) 10 MG TABS tablet; Take 1 tablet (10 mg total) by mouth daily. -     glipiZIDE (GLUCOTROL) 5 MG tablet; Take 1 tablet (5 mg total) by mouth 2 (two) times daily before a meal.  Hyperlipidemia associated with type 2 diabetes mellitus ([place]) -     Lipid panel  Clotting disorder ([place])  Coronary artery disease involving native coronary artery of native heart with angina pectoris [facility])    Patient Instructions       If you have lab work done today you will be contacted with your lab results within the next 2 weeks.  If you have not heard from [place] then  please contact us. The fastest way to get your results is to register for My Chart.   IF you received an x-ray today, you will receive an invoice from [facility]. Please contact [facility] at [phone] with questions or concerns regarding your invoice.   IF you received labwork today, you will receive an invoice from [place]. Please contact [facility] at [phone] with questions or concerns regarding your invoice.   Our billing staff will not be able to assist you with questions regarding bills from these companies.  You will be contacted with the lab results as soon as they are available. The fastest way to get your results is to activate your My Chart account. Instructions are located on the last page of this paperwork. If you have not heard from [place] regarding the results in 2 weeks, please contact this office.     Diabetes Mellitus and Nutrition, Adult When you have diabetes, or diabetes mellitus, it is very important to have healthy eating habits because your blood sugar (glucose) levels are greatly affected by what you eat and drink. Eating healthy foods in the right amounts, at about the same times every day, can help you:  Control your blood glucose.  Lower your risk of  heart disease.  Improve your blood pressure.  Reach or maintain a healthy weight. What can affect my meal plan? Every person with diabetes is different, and each person has different needs for a meal plan. Your health care provider may recommend that you work with a dietitian to make a meal plan that is best for you. Your meal plan may vary depending on factors such as:  The calories you need.  The medicines you take.  Your weight.  Your blood glucose, blood pressure, and cholesterol levels.  Your activity level.  Other health conditions you have, such as heart or kidney disease. How do carbohydrates affect me? Carbohydrates, also called carbs, affect your blood glucose level more than any other type of food. Eating carbs naturally raises the amount of glucose in your blood. Carb counting is a method for keeping track of how many carbs you eat. Counting carbs is important to keep your blood glucose at a healthy level, especially if you use insulin or take certain oral diabetes medicines. It is important to know how many carbs you can safely have in each meal. This is different for every person. Your dietitian can help you calculate how many carbs you should have at each meal and for each snack. How does alcohol affect me? Alcohol can cause a sudden decrease in blood glucose (hypoglycemia), especially if you use insulin or take certain oral diabetes medicines. Hypoglycemia can be a life-threatening condition. Symptoms of hypoglycemia, such as sleepiness, dizziness, and confusion, are similar to symptoms of having too much alcohol.  Do not drink alcohol if: ? Your health care provider tells you not to drink. ? You are pregnant, may be pregnant, or are planning to become pregnant.  If you drink alcohol: ? Do not drink on an empty stomach. ? Limit how much you use to:  0-1 drink a day for women.  0-2 drinks a day for men. ? Be aware of how much alcohol is in your drink. In the [place]  one drink equals one 12 oz bottle of beer (355 mL), one 5 oz glass of wine (148 mL), or one 1 oz glass of hard liquor (44 mL). ? Keep yourself hydrated with water, diet soda, or unsweetened iced tea.  Keep in mind that regular soda, juice, and other mixers may contain a lot of sugar and must be counted as carbs. What are tips for following this plan? Reading food labels  Start by checking the serving size on the "Nutrition Facts" label of packaged foods and drinks. The amount of calories, carbs, fats, and other nutrients listed on the label is based on one serving of the item. Many items contain more than one serving per package.  Check the total grams (g) of carbs in one serving. You can calculate the number of servings of carbs in one serving by dividing the total carbs by 15. For example, if a food has 30 g of total carbs per serving, it would be equal to 2 servings of carbs.  Check the number of grams (g) of saturated fats and trans fats in one serving. Choose foods that have a low amount or none of these fats.  Check the number of milligrams (mg) of salt (sodium) in one serving. Most people should limit total sodium intake to less than 2,300 mg per day.  Always check the nutrition information of foods labeled as "low-fat" or "nonfat." These foods may be higher in added sugar or refined carbs and should be avoided.  Talk to your dietitian to identify your daily goals for nutrients listed on the label. Shopping  Avoid buying canned, pre-made, or processed foods. These foods tend to be high in fat, sodium, and added sugar.  Shop around the outside edge of the grocery store. This is where you will most often find fresh fruits and vegetables, bulk grains, fresh meats, and fresh dairy. Cooking  Use low-heat cooking methods, such as baking, instead of high-heat cooking methods like deep frying.  Cook using healthy oils, such as olive, canola, or sunflower oil.  Avoid cooking with butter,  cream, or high-fat meats. Meal planning  Eat meals and snacks regularly, preferably at the same times every day. Avoid going long periods of time without eating.  Eat foods that are high in fiber, such as fresh fruits, vegetables, beans, and whole grains. Talk with your dietitian about how many servings of carbs you can eat at each meal.  Eat 4-6 oz (112-168 g) of lean protein each day, such as lean meat, chicken, fish, eggs, or tofu. One ounce (oz) of lean protein is equal to: ? 1 oz (28 g) of meat, chicken, or fish. ? 1 egg. ?  cup (62 g) of tofu.  Eat some foods each day that contain healthy fats, such as avocado, nuts, seeds, and fish.   What foods should I eat? Fruits Berries. Apples. Oranges. Peaches. Apricots. Plums. Grapes. Mango. Papaya. Pomegranate. Kiwi. Cherries. Vegetables Lettuce. Spinach. Leafy greens, including kale, chard, collard greens, and mustard greens. Beets. Cauliflower. Cabbage. Broccoli. Carrots. Green beans. Tomatoes. Peppers. Onions. Cucumbers. Brussels sprouts. Grains Whole grains, such as whole-wheat or whole-grain bread, crackers, tortillas, cereal, and pasta. Unsweetened oatmeal. Quinoa. Brown or wild rice. Meats and other proteins Seafood. Poultry without skin. Lean cuts of poultry and beef. Tofu. Nuts. Seeds. Dairy Low-fat or fat-free dairy products such as milk, yogurt, and cheese. The items listed above may not be a complete list of foods and beverages you can eat. Contact a dietitian for more information. What foods should I avoid? Fruits Fruits canned with syrup. Vegetables Canned vegetables. Frozen vegetables with butter or cream sauce. Grains Refined white flour and flour products such as bread, pasta, snack foods, and cereals. Avoid all processed foods. Meats  and other proteins Fatty cuts of meat. Poultry with skin. Breaded or fried meats. Processed meat. Avoid saturated fats. Dairy Full-fat yogurt, cheese, or milk. Beverages Sweetened  drinks, such as soda or iced tea. The items listed above may not be a complete list of foods and beverages you should avoid. Contact a dietitian for more information. Questions to ask a health care provider  Do I need to meet with a diabetes educator?  Do I need to meet with a dietitian?  What number can I call if I have questions?  When are the best times to check my blood glucose? Where to find more information:  American Diabetes Association: diabetes.org  Academy of Nutrition and Dietetics: www.eatright.[employer] of Diabetes and Digestive and Kidney Diseases: [employer]  Association of Diabetes Care and Education Specialists: www.diabeteseducator.org Summary  It is important to have healthy eating habits because your blood sugar (glucose) levels are greatly affected by what you eat and drink.  A healthy meal plan will help you control your blood glucose and maintain a healthy lifestyle.  Your health care provider may recommend that you work with a dietitian to make a meal plan that is best for you.  Keep in mind that carbohydrates (carbs) and alcohol have immediate effects on your blood glucose levels. It is important to count carbs and to use alcohol carefully. This information is not intended to replace advice given to you by your health care provider. Make sure you discuss any questions you have with your health care provider. Document Revised: 08/17/2019 Document Reviewed: 08/17/2019 Elsevier Patient Education  2021 Elsevier Inc.      Agustina Caroli, MD [facility]

## 2020-11-16 NOTE — Assessment & Plan Note (Signed)
Fasting lipid profile done today.  Intolerant to statins.  Patient on Praluent. Recent cardiology office visit notes reviewed.

## 2020-11-16 NOTE — Assessment & Plan Note (Signed)
Well-controlled hypertension.  Continue present medications.  No changes. Hemoglobin A1c at 7.5 about the same as before.  Presently taking 5 mg of glipizide twice a day and Jardiance 10 mg daily.  Blood sugars at home within normal limits. Eating better and exercising regularly.  Patient has been skipping second dose of glipizide occasionally.  He wants to stay on present regimen for the next 3 months. Diet and nutrition discussed. Follow-up in 3 months.

## 2020-11-16 NOTE — Patient Instructions (Addendum)
   If you have lab work done today you will be contacted with your lab results within the next 2 weeks.  If you have not heard from us then please contact us. The fastest way to get your results is to register for My Chart.   IF you received an x-ray today, you will receive an invoice from [facility]. Please contact [facility] at [phone] with questions or concerns regarding your invoice.   IF you received labwork today, you will receive an invoice from [facility]. Please contact [facility] at [phone] with questions or concerns regarding your invoice.   Our billing staff will not be able to assist you with questions regarding bills from these companies.  You will be contacted with the lab results as soon as they are available. The fastest way to get your results is to activate your My Chart account. Instructions are located on the last page of this paperwork. If you have not heard from us regarding the results in 2 weeks, please contact this office.     Diabetes Mellitus and Nutrition, Adult When you have diabetes, or diabetes mellitus, it is very important to have healthy eating habits because your blood sugar (glucose) levels are greatly affected by what you eat and drink. Eating healthy foods in the right amounts, at about the same times every day, can help you:  Control your blood glucose.  Lower your risk of heart disease.  Improve your blood pressure.  Reach or maintain a healthy weight. What can affect my meal plan? Every person with diabetes is different, and each person has different needs for a meal plan. Your health care provider may recommend that you work with a dietitian to make a meal plan that is best for you. Your meal plan may vary depending on factors such as:  The calories you need.  The medicines you take.  Your weight.  Your blood glucose, blood pressure, and cholesterol levels.  Your activity level.  Other health conditions you  have, such as heart or kidney disease. How do carbohydrates affect me? Carbohydrates, also called carbs, affect your blood glucose level more than any other type of food. Eating carbs naturally raises the amount of glucose in your blood. Carb counting is a method for keeping track of how many carbs you eat. Counting carbs is important to keep your blood glucose at a healthy level, especially if you use insulin or take certain oral diabetes medicines. It is important to know how many carbs you can safely have in each meal. This is different for every person. Your dietitian can help you calculate how many carbs you should have at each meal and for each snack. How does alcohol affect me? Alcohol can cause a sudden decrease in blood glucose (hypoglycemia), especially if you use insulin or take certain oral diabetes medicines. Hypoglycemia can be a life-threatening condition. Symptoms of hypoglycemia, such as sleepiness, dizziness, and confusion, are similar to symptoms of having too much alcohol.  Do not drink alcohol if: ? Your health care provider tells you not to drink. ? You are pregnant, may be pregnant, or are planning to become pregnant.  If you drink alcohol: ? Do not drink on an empty stomach. ? Limit how much you use to:  0-1 drink a day for women.  0-2 drinks a day for men. ? Be aware of how much alcohol is in your drink. In the [place] one drink equals one 12 oz bottle of beer (355 mL),   one 5 oz glass of wine (148 mL), or one 1 oz glass of hard liquor (44 mL). ? Keep yourself hydrated with water, diet soda, or unsweetened iced tea.  Keep in mind that regular soda, juice, and other mixers may contain a lot of sugar and must be counted as carbs. What are tips for following this plan? Reading food labels  Start by checking the serving size on the "Nutrition Facts" label of packaged foods and drinks. The amount of calories, carbs, fats, and other nutrients listed on the label is based on  one serving of the item. Many items contain more than one serving per package.  Check the total grams (g) of carbs in one serving. You can calculate the number of servings of carbs in one serving by dividing the total carbs by 15. For example, if a food has 30 g of total carbs per serving, it would be equal to 2 servings of carbs.  Check the number of grams (g) of saturated fats and trans fats in one serving. Choose foods that have a low amount or none of these fats.  Check the number of milligrams (mg) of salt (sodium) in one serving. Most people should limit total sodium intake to less than 2,300 mg per day.  Always check the nutrition information of foods labeled as "low-fat" or "nonfat." These foods may be higher in added sugar or refined carbs and should be avoided.  Talk to your dietitian to identify your daily goals for nutrients listed on the label. Shopping  Avoid buying canned, pre-made, or processed foods. These foods tend to be high in fat, sodium, and added sugar.  Shop around the outside edge of the grocery store. This is where you will most often find fresh fruits and vegetables, bulk grains, fresh meats, and fresh dairy. Cooking  Use low-heat cooking methods, such as baking, instead of high-heat cooking methods like deep frying.  Cook using healthy oils, such as olive, canola, or sunflower oil.  Avoid cooking with butter, cream, or high-fat meats. Meal planning  Eat meals and snacks regularly, preferably at the same times every day. Avoid going long periods of time without eating.  Eat foods that are high in fiber, such as fresh fruits, vegetables, beans, and whole grains. Talk with your dietitian about how many servings of carbs you can eat at each meal.  Eat 4-6 oz (112-168 g) of lean protein each day, such as lean meat, chicken, fish, eggs, or tofu. One ounce (oz) of lean protein is equal to: ? 1 oz (28 g) of meat, chicken, or fish. ? 1 egg. ?  cup (62 g) of  tofu.  Eat some foods each day that contain healthy fats, such as avocado, nuts, seeds, and fish.   What foods should I eat? Fruits Berries. Apples. Oranges. Peaches. Apricots. Plums. Grapes. Mango. Papaya. Pomegranate. Kiwi. Cherries. Vegetables Lettuce. Spinach. Leafy greens, including kale, chard, collard greens, and mustard greens. Beets. Cauliflower. Cabbage. Broccoli. Carrots. Green beans. Tomatoes. Peppers. Onions. Cucumbers. Brussels sprouts. Grains Whole grains, such as whole-wheat or whole-grain bread, crackers, tortillas, cereal, and pasta. Unsweetened oatmeal. Quinoa. Brown or wild rice. Meats and other proteins Seafood. Poultry without skin. Lean cuts of poultry and beef. Tofu. Nuts. Seeds. Dairy Low-fat or fat-free dairy products such as milk, yogurt, and cheese. The items listed above may not be a complete list of foods and beverages you can eat. Contact a dietitian for more information. What foods should I avoid? Fruits Fruits canned   with syrup. Vegetables Canned vegetables. Frozen vegetables with butter or cream sauce. Grains Refined white flour and flour products such as bread, pasta, snack foods, and cereals. Avoid all processed foods. Meats and other proteins Fatty cuts of meat. Poultry with skin. Breaded or fried meats. Processed meat. Avoid saturated fats. Dairy Full-fat yogurt, cheese, or milk. Beverages Sweetened drinks, such as soda or iced tea. The items listed above may not be a complete list of foods and beverages you should avoid. Contact a dietitian for more information. Questions to ask a health care provider  Do I need to meet with a diabetes educator?  Do I need to meet with a dietitian?  What number can I call if I have questions?  When are the best times to check my blood glucose? Where to find more information:  American Diabetes Association: diabetes.org  Academy of Nutrition and Dietetics: www.eatright.org  National Institute of  Diabetes and Digestive and Kidney Diseases: www.niddk.nih.gov  Association of Diabetes Care and Education Specialists: www.diabeteseducator.org Summary  It is important to have healthy eating habits because your blood sugar (glucose) levels are greatly affected by what you eat and drink.  A healthy meal plan will help you control your blood glucose and maintain a healthy lifestyle.  Your health care provider may recommend that you work with a dietitian to make a meal plan that is best for you.  Keep in mind that carbohydrates (carbs) and alcohol have immediate effects on your blood glucose levels. It is important to count carbs and to use alcohol carefully. This information is not intended to replace advice given to you by your health care provider. Make sure you discuss any questions you have with your health care provider. Document Revised: 08/17/2019 Document Reviewed: 08/17/2019 Elsevier Patient Education  2021 Elsevier Inc.  

## 2020-11-29 ENCOUNTER — Ambulatory Visit: Payer: Self-pay | Admitting: *Deleted

## 2020-11-29 NOTE — Telephone Encounter (Signed)
Pt is requesting a prescription be called in for "The glucose monitor that lets you use your phone app with it to keep track of blood sugars. ." States [facility] has that model, unsure of name. Pt was told prescription was needed to obtain.   If appropriate[facility] [address]  Reason for Disposition . [1] Caller requesting NON-URGENT health information AND [2] PCP's office is the best resource  Answer Assessment - Initial Assessment Questions 1. REASON FOR CALL or QUESTION: "What is your reason for calling today?" or "How can I best help you?" or "What question do you have that I can help answer?"     Glucose monitor  Protocols used: INFORMATION ONLY CALL - NO TRIAGE-A-AH

## 2020-11-30 ENCOUNTER — Encounter: Payer: Self-pay | Admitting: Gastroenterology

## 2020-11-30 ENCOUNTER — Other Ambulatory Visit: Payer: Self-pay

## 2020-11-30 DIAGNOSIS — Z800 Family history of malignant neoplasm of digestive organs: Secondary | ICD-10-CM

## 2020-11-30 DIAGNOSIS — K635 Polyp of colon: Secondary | ICD-10-CM

## 2020-12-01 ENCOUNTER — Telehealth: Payer: Self-pay | Admitting: Genetic Counselor

## 2020-12-01 MED ORDER — FREESTYLE LIBRE 2 SENSOR MISC: 1 refills | Status: DC

## 2020-12-01 NOTE — Telephone Encounter (Signed)
Received a genetic counseling referral from Dr. Tarri Glenn for fhx of colon cancer and multiple colon polyps. Gregory Mathews has been cld and schedule to see Santiago Glad on 3/23 at 9am. Pt aware to arrive 15 minutes early.

## 2020-12-01 NOTE — Telephone Encounter (Signed)
Sent!

## 2020-12-01 NOTE — Addendum Note (Signed)
Addended by: Patrcia Dolly on: 12/01/2020 09:50 AM   Modules accepted: Orders

## 2020-12-05 ENCOUNTER — Telehealth: Payer: Self-pay | Admitting: *Deleted

## 2020-12-05 DIAGNOSIS — I2510 Atherosclerotic heart disease of native coronary artery without angina pectoris: Secondary | ICD-10-CM

## 2020-12-05 DIAGNOSIS — Z0289 Encounter for other administrative examinations: Secondary | ICD-10-CM

## 2020-12-05 NOTE — Telephone Encounter (Signed)
OK to order GXT -- he has had before.    - If we do a short Encounter - use Dx of Coronary Artery Disease involving native Coronary Artery Disease without Angina & Encounter for DOT Physical   Glenetta Hew, MD

## 2020-12-05 NOTE — Telephone Encounter (Signed)
Patient came by the office . 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    Patient is aware will need to get an order from Dr Ellyn Hack prior to test being schedule , aware may not be able to schedule before the March 22.2022.Marland Kitchen Patient is also aware,he will need a covid test 3 days prior to if a stress myoview is needed.   will contact patient once Dr Ellyn Hack review.

## 2020-12-06 NOTE — Telephone Encounter (Signed)
Spoke to patient. Patient is aware Dr Ellyn Hack has order GXT for -renewal. patient is also aware he will need a covid test 3 days prior to GXT.  Patient is also aware may not beable to complete test before December 12, 2020 Patient verbalized understanding.  RN sent information to scheduler for processing

## 2020-12-09 ENCOUNTER — Other Ambulatory Visit (HOSPITAL_COMMUNITY)
Admission: RE | Admit: 2020-12-09 | Discharge: 2020-12-09 | Disposition: A | Discharge status: Home / Self Care | Payer: Medicare Other | Source: Ambulatory Visit | Attending: Cardiology | Admitting: Cardiology

## 2020-12-09 DIAGNOSIS — Z20822 Contact with and (suspected) exposure to covid-19: Secondary | ICD-10-CM | POA: Diagnosis not present

## 2020-12-09 DIAGNOSIS — Z01812 Encounter for preprocedural laboratory examination: Secondary | ICD-10-CM | POA: Insufficient documentation

## 2020-12-10 LAB — SARS CORONAVIRUS 2 (TAT 6-24 HRS): SARS Coronavirus 2: NEGATIVE

## 2020-12-12 ENCOUNTER — Other Ambulatory Visit: Payer: Self-pay

## 2020-12-12 ENCOUNTER — Ambulatory Visit (HOSPITAL_COMMUNITY)
Admission: RE | Admit: 2020-12-12 | Discharge: 2020-12-12 | Disposition: A | Discharge status: Home / Self Care | Payer: Medicare Other | Source: Ambulatory Visit | Attending: Cardiovascular Disease | Admitting: Cardiovascular Disease

## 2020-12-12 DIAGNOSIS — Z0289 Encounter for other administrative examinations: Secondary | ICD-10-CM | POA: Diagnosis not present

## 2020-12-12 DIAGNOSIS — I2510 Atherosclerotic heart disease of native coronary artery without angina pectoris: Secondary | ICD-10-CM

## 2020-12-12 LAB — EXERCISE TOLERANCE TEST
Estimated workload: 8.3 METS
Exercise duration (min): 6 min
Exercise duration (sec): 52 s
MPHR: 148 {beats}/min
Peak HR: 137 {beats}/min
Percent HR: 92 %
Rest HR: 86 {beats}/min

## 2020-12-13 ENCOUNTER — Encounter: Payer: Self-pay | Admitting: *Deleted

## 2020-12-13 ENCOUNTER — Inpatient Hospital Stay: Payer: Medicare Other | Attending: Genetic Counselor | Admitting: Genetic Counselor

## 2020-12-13 ENCOUNTER — Encounter: Payer: Self-pay | Admitting: Genetic Counselor

## 2020-12-13 ENCOUNTER — Inpatient Hospital Stay: Payer: Medicare Other

## 2020-12-13 DIAGNOSIS — Z800 Family history of malignant neoplasm of digestive organs: Secondary | ICD-10-CM | POA: Diagnosis not present

## 2020-12-13 DIAGNOSIS — H43813 Vitreous degeneration, bilateral: Secondary | ICD-10-CM | POA: Diagnosis not present

## 2020-12-13 DIAGNOSIS — H04123 Dry eye syndrome of bilateral lacrimal glands: Secondary | ICD-10-CM | POA: Diagnosis not present

## 2020-12-13 DIAGNOSIS — K635 Polyp of colon: Secondary | ICD-10-CM | POA: Diagnosis not present

## 2020-12-13 DIAGNOSIS — Z803 Family history of malignant neoplasm of breast: Secondary | ICD-10-CM | POA: Diagnosis not present

## 2020-12-13 DIAGNOSIS — H524 Presbyopia: Secondary | ICD-10-CM | POA: Diagnosis not present

## 2020-12-13 DIAGNOSIS — E119 Type 2 diabetes mellitus without complications: Secondary | ICD-10-CM | POA: Diagnosis not present

## 2020-12-13 LAB — HM DIABETES EYE EXAM

## 2020-12-13 LAB — GENETIC SCREENING ORDER

## 2020-12-13 NOTE — Progress Notes (Signed)
REFERRING PROVIDER: Thornton Park, MD [place],  [place] [address]  PRIMARY PROVIDER:  Horald Pollen, MD  PRIMARY REASON FOR VISIT:  1. Family history of colon cancer   2. Family history of malignant neoplasm of breast   3. Polyposis of colon      HISTORY OF PRESENT ILLNESS:   [name], a 75 y.o. male, was seen for a [facility] genetics consultation at the request of Dr. Tarri Glenn due to a personal history of polyposis and family history of cancer.  [name] presents to clinic today to discuss the possibility of a hereditary predisposition to cancer, genetic testing, and to further clarify his future cancer risks, as well as potential cancer risks for family members.   [name] is a 75 y.o. male with no personal history of cancer.  He recently had 12 tubular adenomas found on colonoscopy.    CANCER HISTORY:  Oncology History   No history exists.    Past Medical History:  Diagnosis Date  . Allergy   . Basal cell carcinoma 01/2013   Nasal and R forearm.  [facility]  . CAD S/P percutaneous coronary angioplasty    2/19 PCI/DESx1 to mLAD, FFR 0.7 -Sierra DES 2.75 mm x 18 mm  . Cataract   . Diabetes mellitus without complication (HCC)    on PO Meds  . Essential hypertension   . Family history of colon cancer   . Family history of malignant neoplasm of breast   . Heart murmur   . Hyperlipidemia    statin intolerant -- myalgias, fatigue (tried at least 4)  . Polyposis of colon   . Rosacea     Past Surgical History:  Procedure Laterality Date  . basal cell removal     nose and wrist   . BELPHAROPTOSIS REPAIR    . CORONARY CT ANGIOGRAM  09/2017   Coronary Calcium Score: 111. Coronary calcification noted in the LEFT MAIN (LM) and prox LAD.  LM < 50% calcified stenosis.  Mid LAD 50-75% plaque noted CT FFR --> positive at 0.80.  Recommend CARDIAC CATHETERIZATION  . CORONARY STENT INTERVENTION N/A 11/12/2017   Procedure: CORONARY  STENT INTERVENTION;  Surgeon: Leonie Man, MD;  Location: [place] [facility];  Service: Cardiovascular:  DES PCI:  STENT SIERRA 2.75 X 18 MM - Post intervention, there is a 0% residual stenosis.  Marland Kitchen ETT/GXT: Graded Exercise Tolerance Test  08/2015    Exercised for 9:01 min --> blunted blood pressure response no EKG changes.  No chest pain.  Low Risk  . EYE SURGERY     Cataract surgery  . eyelid surgery    . INTRAVASCULAR PRESSURE WIRE/FFR STUDY N/A 11/12/2017   Procedure: INTRAVASCULAR PRESSURE WIRE/FFR STUDY;  Surgeon: Leonie Man, MD;  Location: [place] [facility];  Service: Cardiovascular;  Laterality: LAD -FFR 0.76  . LEFT HEART CATH AND CORONARY ANGIOGRAPHY N/A 11/12/2017   Procedure: LEFT HEART CATH AND CORONARY ANGIOGRAPHY;  Surgeon: Leonie Man, MD;  Location: [facility]: 70% P-M LAD (FFR 0.76).  40% mid LAD, 50% ostial ramus.  EF 55-60%.  Mildly elevated LVEDP.  Marland Kitchen SHOULDER OPEN ROTATOR CUFF REPAIR  1983  . TRANSTHORACIC ECHOCARDIOGRAM  08/2017   EF 55-60%.  Mild aortic stenosis (mean gradient 11 mmH.)  GR 1 DD.  Mild LA dilation.    Social History   Socioeconomic History  . Marital status: Married    Spouse name: Gregory Mathews  .  Number of children: 4  . Years of education: 28  . Highest education level: Not on file  Occupational History  . Occupation: bus Engineer, manufacturing systems: [employer]  Tobacco Use  . Smoking status: Former Smoker    Packs/day: 1.00    Years: 15.00    Pack years: 15.00    Quit date: 07/09/1982    Years since quitting: 38.4  . Smokeless tobacco: Never Used  Substance and Sexual Activity  . Alcohol use: No  . Drug use: No  . Sexual activity: Yes    Partners: Female  Other Topics Concern  . Not on file  Social History Narrative   Marital status: married x 66 years; happily married.   Lives: with wife       Children:  4 children; 4 grandchildren. Adult children all live locally.          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      Tobacco:  In past; smoked x 15 years; quit 35 years ago.      Alcohol: none      ADLs: independent with all ADLs.  Does not use assistant device with ambulation.      Living Will:  Has living will; desires FULL CODE; no prolonged measures.      Doctoral degree in Biblical Studies.          Diet: eats twice daily, eggs,sausage,and fruits for breakfast; chicken (fried or broil), occassional pizza, salads, steak and pork chops.       Exercise: cardiac rehab, transition to [facility] - 45 minutes walks and resistance 5x/week   Social Determinants of Health   Financial Resource Strain: Not on file  Food Insecurity: Not on file  Transportation Needs: Not on file  Physical Activity: Not on file  Stress: Not on file  Social Connections: Not on file     FAMILY HISTORY:  We obtained a detailed, 4-generation family history.  Significant diagnoses are listed below: Family History  Problem Relation Age of Onset  . Leukemia Mother 63       d. 62  . Stroke Father 17       as a complication of CABG  . Colon cancer Father 68  . Coronary artery disease Father 31       - Sx was DOE - MV CAD  . Peripheral Artery Disease Father        presumptive  . Diabetes Sister   . Sudden Cardiac Death Sister 56       presumed MI  . Heart attack Brother 81       During throat surgery  . Congestive Heart Failure Brother        Died @ 87 - ? CHF / ICM  . Breast cancer Maternal Grandmother        dx in her 19s  . Stroke Maternal Grandfather   . Diabetes Brother 64       on lots of meds  . Diabetes Brother   . Diabetes Paternal Grandmother   . Rheumatic fever Brother        childhood RF --> Valvular Dz - valve Sgx,  died young  . Stomach cancer Neg Hx   . Esophageal cancer Neg Hx     The patient has two daughters and two sons who are cancer free. He has eight brothers and three sisters who are cancer free.   Both parents  are deceased.  The patient's father had colon cancer at 20.  He had 12 siblings who did not have cancer.  His parents are deceased from non-cancer related issues.  The patient's mother died of leukemia.  She had 10 siblings who are cancer free.  Her mother had breast cancer and her father died of a stroke.  Mr. Lamphier is unaware of previous family history of genetic testing for hereditary cancer risks. Patient's maternal ancestors are of [place] descent, and paternal ancestors are of [place] descent. There is no reported Ashkenazi Jewish ancestry. There is no known consanguinity.  GENETIC COUNSELING ASSESSMENT: Mr. Medinger is a 75 y.o. male with a personal history of polyposis and family history of colon cancer which is somewhat suggestive of a hereditary cancer syndrome and predisposition to cancer given his polyposis. We, therefore, discussed and recommended the following at today's visit.   DISCUSSION: We discussed that 5-7% of colon cancer is hereditary, with most cases associated with Lynch syndrome.  There are other genes that can be associated with hereditary colon cancer syndromes, with polyposis.  These include APC and MUTYH.  We discussed that testing is beneficial for several reasons including knowing how to follow individuals after completing their treatment,and understand if other family members could be at risk for cancer and allow them to undergo genetic testing.   We reviewed the characteristics, features and inheritance patterns of hereditary cancer syndromes. We also discussed genetic testing, including the appropriate family members to test, the process of testing, insurance coverage and turn-around-time for results. We discussed the implications of a negative, positive, carrier and/or variant of uncertain significant result. We recommended Mr. Mcguirt pursue genetic testing for the common hereditary cancer gene panel+RNA.  The Common Hereditary Gene Panel offered by  [facility] includes sequencing and/or deletion duplication testing of the following 48 genes: APC, ATM, AXIN2, BARD1, BMPR1A, BRCA1, BRCA2, BRIP1, CDH1, CDK4, CDKN2A (p14ARF), CDKN2A (p16INK4a), CHEK2, CTNNA1, DICER1, EPCAM (Deletion/duplication testing only), GREM1 (promoter region deletion/duplication testing only), KIT, MEN1, MLH1, MSH2, MSH3, MSH6, MUTYH, NBN, NF1, NHTL1, PALB2, PDGFRA, PMS2, POLD1, POLE, PTEN, RAD50, RAD51C, RAD51D, RNF43, SDHB, SDHC, SDHD, SMAD4, SMARCA4. STK11, TP53, TSC1, TSC2, and VHL.  The following genes were evaluated for sequence changes only: SDHA and HOXB13 c.251G>A variant only.   Based on Mr. Krupinski personal history of polyposis and family history of cancer, he meets medical criteria for genetic testing. Despite that he meets criteria, he may still have an out of pocket cost. We discussed that if his out of pocket cost for testing is over $100, the laboratory will call and confirm whether he wants to proceed with testing.  If the out of pocket cost of testing is less than $100 he will be billed by the genetic testing laboratory.   PLAN: After considering the risks, benefits, and limitations, Mr. Milosevic provided informed consent to pursue genetic testing and the blood sample was sent to [facility] for analysis of the common hereditary cancer panel + RNA. Results should be available within approximately 2-3 weeks' time, at which point they will be disclosed by telephone to Mr. Doswell, as will any additional recommendations warranted by these results. Mr. Tse will receive a summary of his genetic counseling visit and a copy of his results once available. This information will also be available in Epic.   Lastly, we encouraged Mr. Nimmons to remain in contact with cancer genetics annually so that we can continuously update the family history and inform him of any changes in  cancer genetics and testing that may be of benefit for this family.   Mr. Hedberg  questions were answered to his satisfaction today. Our contact information was provided should additional questions or concerns arise. Thank you for the referral and allowing [place] to share in the care of your patient.   Erastus Bartolomei P. Florene Glen, [place], [place] [facility] Licensed, Insurance risk surveyor [email or web address] phone: [phone]  The patient was seen for a total of 45 minutes in face-to-face genetic counseling.  This patient was discussed with Drs. Magrinat, Lindi Adie and/or Burr Medico who agrees with the above.    _______________________________________________________________________ For Office Staff:  Number of people involved in session: 1 Was an Intern/ student involved with case: no

## 2020-12-22 ENCOUNTER — Encounter: Payer: Self-pay | Admitting: *Deleted

## 2021-01-01 ENCOUNTER — Telehealth: Payer: Self-pay | Admitting: Genetic Counselor

## 2021-01-01 ENCOUNTER — Ambulatory Visit: Payer: Self-pay | Admitting: Genetic Counselor

## 2021-01-01 ENCOUNTER — Encounter: Payer: Self-pay | Admitting: Genetic Counselor

## 2021-01-01 DIAGNOSIS — Z1379 Encounter for other screening for genetic and chromosomal anomalies: Secondary | ICD-10-CM

## 2021-01-01 NOTE — Progress Notes (Signed)
HPI:  Mr. Eichenberger was previously seen in the [facility] due to a personal history of polyposis and family history of colon cancer and concerns regarding a hereditary predisposition to cancer. Please refer to our prior cancer genetics clinic note for more information regarding our discussion, assessment and recommendations, at the time. Mr. Lamson recent genetic test results were disclosed to him, as were recommendations warranted by these results. These results and recommendations are discussed in more detail below.  CANCER HISTORY:  Oncology History   No history exists.    FAMILY HISTORY:  We obtained a detailed, 4-generation family history.  Significant diagnoses are listed below: Family History  Problem Relation Age of Onset  . Leukemia Mother 79       d. 26  . Stroke Father 14       as a complication of CABG  . Colon cancer Father 35  . Coronary artery disease Father 89       - Sx was DOE - MV CAD  . Peripheral Artery Disease Father        presumptive  . Diabetes Sister   . Sudden Cardiac Death Sister 103       presumed MI  . Heart attack Brother 81       During throat surgery  . Congestive Heart Failure Brother        Died @ 35 - ? CHF / ICM  . Breast cancer Maternal Grandmother        dx in her 77s  . Stroke Maternal Grandfather   . Diabetes Brother 64       on lots of meds  . Diabetes Brother   . Diabetes Paternal Grandmother   . Rheumatic fever Brother        childhood RF --> Valvular Dz - valve Sgx,  died young  . Stomach cancer Neg Hx   . Esophageal cancer Neg Hx     The patient has two daughters and two sons who are cancer free. He has eight brothers and three sisters who are cancer free.  Both parents are deceased.  The patient's father had colon cancer at 76.  He had 12 siblings who did not have cancer.  His parents are deceased from non-cancer related issues.  The patient's mother died of leukemia.  She had 10 siblings who are  cancer free.  Her mother had breast cancer and her father died of a stroke.  Mr. Bonn is unaware of previous family history of genetic testing for hereditary cancer risks. Patient's maternal ancestors are of [place] descent, and paternal ancestors are of [place] descent. There is no reported Ashkenazi Jewish ancestry. There is no known consanguinity.  GENETIC TEST RESULTS: Genetic testing reported out on December 30, 2020 through the common hereditary cancer panel found no pathogenic mutations. The Common Hereditary Gene Panel offered by [facility] includes sequencing and/or deletion duplication testing of the following 48 genes: APC, ATM, AXIN2, BARD1, BMPR1A, BRCA1, BRCA2, BRIP1, CDH1, CDK4, CDKN2A (p14ARF), CDKN2A (p16INK4a), CHEK2, CTNNA1, DICER1, EPCAM (Deletion/duplication testing only), GREM1 (promoter region deletion/duplication testing only), KIT, MEN1, MLH1, MSH2, MSH3, MSH6, MUTYH, NBN, NF1, NHTL1, PALB2, PDGFRA, PMS2, POLD1, POLE, PTEN, RAD50, RAD51C, RAD51D, RNF43, SDHB, SDHC, SDHD, SMAD4, SMARCA4. STK11, TP53, TSC1, TSC2, and VHL.  The following genes were evaluated for sequence changes only: SDHA and HOXB13 c.251G>A variant only. The test report has been scanned into EPIC and is located under the Molecular Pathology section of the Results Review tab.  A portion of the result report is included below for reference.     We discussed with Mr. Reesman that because current genetic testing is not perfect, it is possible there may be a gene mutation in one of these genes that current testing cannot detect, but that chance is small.  We also discussed, that there could be another gene that has not yet been discovered, or that we have not yet tested, that is responsible for the cancer diagnoses in the family. It is also possible there is a hereditary cause for the cancer in the family that Mr. Pauley did not inherit and therefore was not identified in his testing.  Therefore, it is important to  remain in touch with cancer genetics in the future so that we can continue to offer Mr. Fortson the most up to date genetic testing.   ADDITIONAL GENETIC TESTING: We discussed with Mr. Perrow that there are other genes that are associated with increased cancer risk that can be analyzed. Should Mr. Weidner wish to pursue additional genetic testing, we are happy to discuss and coordinate this testing, at any time.    CANCER SCREENING RECOMMENDATIONS: Mr. Tinkham test result is considered negative (normal).  This means that we have not identified a hereditary cause for his personal history of colon polyps and family history of cancer at this time. Most cancers happen by chance and this negative test suggests that his cancer may fall into this category.    While reassuring, this does not definitively rule out a hereditary predisposition to cancer. It is still possible that there could be genetic mutations that are undetectable by current technology. There could be genetic mutations in genes that have not been tested or identified to increase cancer risk.  Therefore, it is recommended he continue to follow the cancer management and screening guidelines provided by his primary healthcare provider.   An individual's cancer risk and medical management are not determined by genetic test results alone. Overall cancer risk assessment incorporates additional factors, including personal medical history, family history, and any available genetic information that may result in a personalized plan for cancer prevention and surveillance  RECOMMENDATIONS FOR FAMILY MEMBERS:  Individuals in this family might be at some increased risk of developing cancer, over the general population risk, simply due to the family history of cancer.  We recommended women in this family have a yearly mammogram beginning at age 29, or 24 years younger than the earliest onset of cancer, an annual clinical breast exam, and perform monthly  breast self-exams. Women in this family should also have a gynecological exam as recommended by their primary provider. All family members should be referred for colonoscopy starting at age 70.  FOLLOW-UP: Lastly, we discussed with Mr. Batson that cancer genetics is a rapidly advancing field and it is possible that new genetic tests will be appropriate for him and/or his family members in the future. We encouraged him to remain in contact with cancer genetics on an annual basis so we can update his personal and family histories and let him know of advances in cancer genetics that may benefit this family.   Our contact number was provided. Mr. Valent questions were answered to his satisfaction, and he knows he is welcome to call us at anytime with additional questions or concerns.   Roma Kayser, [place], [facility] Licensed, Certified Genetic Counselor [email or web address]

## 2021-01-01 NOTE — Telephone Encounter (Signed)
Revealed negative genetic testing.  Discussed that we do not know why he has polyposis or why there is cancer in the family. It could be due to a different gene that we are not testing, or maybe our current technology may not be able to pick something up.  It will be important for him to keep in contact with genetics to keep up with whether additional testing may be needed.

## 2021-02-04 ENCOUNTER — Other Ambulatory Visit: Payer: Self-pay | Admitting: Emergency Medicine

## 2021-02-04 DIAGNOSIS — Z955 Presence of coronary angioplasty implant and graft: Secondary | ICD-10-CM

## 2021-02-15 ENCOUNTER — Ambulatory Visit: Payer: Self-pay | Admitting: Emergency Medicine

## 2021-02-15 ENCOUNTER — Ambulatory Visit (INDEPENDENT_AMBULATORY_CARE_PROVIDER_SITE_OTHER): Payer: Medicare Other | Admitting: Emergency Medicine

## 2021-02-15 ENCOUNTER — Encounter: Payer: Self-pay | Admitting: Emergency Medicine

## 2021-02-15 ENCOUNTER — Other Ambulatory Visit: Payer: Self-pay

## 2021-02-15 VITALS — BP 122/60 | HR 73 | Temp 98.0°F | Ht 70.0 in | Wt 210.0 lb

## 2021-02-15 DIAGNOSIS — E1159 Type 2 diabetes mellitus with other circulatory complications: Secondary | ICD-10-CM

## 2021-02-15 DIAGNOSIS — T466X5A Adverse effect of antihyperlipidemic and antiarteriosclerotic drugs, initial encounter: Secondary | ICD-10-CM

## 2021-02-15 DIAGNOSIS — I152 Hypertension secondary to endocrine disorders: Secondary | ICD-10-CM | POA: Diagnosis not present

## 2021-02-15 DIAGNOSIS — I700 Atherosclerosis of aorta: Secondary | ICD-10-CM

## 2021-02-15 DIAGNOSIS — G720 Drug-induced myopathy: Secondary | ICD-10-CM | POA: Diagnosis not present

## 2021-02-15 DIAGNOSIS — E1165 Type 2 diabetes mellitus with hyperglycemia: Secondary | ICD-10-CM

## 2021-02-15 DIAGNOSIS — E785 Hyperlipidemia, unspecified: Secondary | ICD-10-CM

## 2021-02-15 DIAGNOSIS — E1169 Type 2 diabetes mellitus with other specified complication: Secondary | ICD-10-CM | POA: Diagnosis not present

## 2021-02-15 LAB — POCT GLYCOSYLATED HEMOGLOBIN (HGB A1C): Hemoglobin A1C: 7.1 % — AB (ref 4.0–5.6)

## 2021-02-15 MED ORDER — GLIPIZIDE 5 MG PO TABS
5.0000 mg | ORAL_TABLET | Freq: Two times a day (BID) | ORAL | 3 refills | Status: DC
Start: 2021-02-15 — End: 2022-03-04

## 2021-02-15 MED ORDER — METFORMIN HCL 500 MG PO TABS: 500.0000 mg | ORAL_TABLET | Freq: Two times a day (BID) | ORAL | 3 refills | Status: DC

## 2021-02-15 MED ORDER — FREESTYLE LIBRE 2 SENSOR MISC: 1 refills | Status: DC

## 2021-02-15 MED ORDER — LOSARTAN POTASSIUM 50 MG PO TABS: 50.0000 mg | ORAL_TABLET | Freq: Every day | ORAL | 3 refills | Status: DC

## 2021-02-15 MED ORDER — EZETIMIBE 10 MG PO TABS: 10.0000 mg | ORAL_TABLET | Freq: Every day | ORAL | 3 refills | Status: DC

## 2021-02-15 NOTE — Patient Instructions (Signed)
Diabetes Mellitus and Nutrition, Adult When you have diabetes, or diabetes mellitus, it is very important to have healthy eating habits because your blood sugar (glucose) levels are greatly affected by what you eat and drink. Eating healthy foods in the right amounts, at about the same times every day, can help you:  Control your blood glucose.  Lower your risk of heart disease.  Improve your blood pressure.  Reach or maintain a healthy weight. What can affect my meal plan? Every person with diabetes is different, and each person has different needs for a meal plan. Your health care provider may recommend that you work with a dietitian to make a meal plan that is best for you. Your meal plan may vary depending on factors such as:  The calories you need.  The medicines you take.  Your weight.  Your blood glucose, blood pressure, and cholesterol levels.  Your activity level.  Other health conditions you have, such as heart or kidney disease. How do carbohydrates affect me? Carbohydrates, also called carbs, affect your blood glucose level more than any other type of food. Eating carbs naturally raises the amount of glucose in your blood. Carb counting is a method for keeping track of how many carbs you eat. Counting carbs is important to keep your blood glucose at a healthy level, especially if you use insulin or take certain oral diabetes medicines. It is important to know how many carbs you can safely have in each meal. This is different for every person. Your dietitian can help you calculate how many carbs you should have at each meal and for each snack. How does alcohol affect me? Alcohol can cause a sudden decrease in blood glucose (hypoglycemia), especially if you use insulin or take certain oral diabetes medicines. Hypoglycemia can be a life-threatening condition. Symptoms of hypoglycemia, such as sleepiness, dizziness, and confusion, are similar to symptoms of having too much  alcohol.  Do not drink alcohol if: ? Your health care provider tells you not to drink. ? You are pregnant, may be pregnant, or are planning to become pregnant.  If you drink alcohol: ? Do not drink on an empty stomach. ? Limit how much you use to:  0-1 drink a day for women.  0-2 drinks a day for men. ? Be aware of how much alcohol is in your drink. In the [place] one drink equals one 12 oz bottle of beer (355 mL), one 5 oz glass of wine (148 mL), or one 1 oz glass of hard liquor (44 mL). ? Keep yourself hydrated with water, diet soda, or unsweetened iced tea.  Keep in mind that regular soda, juice, and other mixers may contain a lot of sugar and must be counted as carbs. What are tips for following this plan? Reading food labels  Start by checking the serving size on the "Nutrition Facts" label of packaged foods and drinks. The amount of calories, carbs, fats, and other nutrients listed on the label is based on one serving of the item. Many items contain more than one serving per package.  Check the total grams (g) of carbs in one serving. You can calculate the number of servings of carbs in one serving by dividing the total carbs by 15. For example, if a food has 30 g of total carbs per serving, it would be equal to 2 servings of carbs.  Check the number of grams (g) of saturated fats and trans fats in one serving. Choose foods that have   a low amount or none of these fats.  Check the number of milligrams (mg) of salt (sodium) in one serving. Most people should limit total sodium intake to less than 2,300 mg per day.  Always check the nutrition information of foods labeled as "low-fat" or "nonfat." These foods may be higher in added sugar or refined carbs and should be avoided.  Talk to your dietitian to identify your daily goals for nutrients listed on the label. Shopping  Avoid buying canned, pre-made, or processed foods. These foods tend to be high in fat, sodium, and added  sugar.  Shop around the outside edge of the grocery store. This is where you will most often find fresh fruits and vegetables, bulk grains, fresh meats, and fresh dairy. Cooking  Use low-heat cooking methods, such as baking, instead of high-heat cooking methods like deep frying.  Cook using healthy oils, such as olive, canola, or sunflower oil.  Avoid cooking with butter, cream, or high-fat meats. Meal planning  Eat meals and snacks regularly, preferably at the same times every day. Avoid going long periods of time without eating.  Eat foods that are high in fiber, such as fresh fruits, vegetables, beans, and whole grains. Talk with your dietitian about how many servings of carbs you can eat at each meal.  Eat 4-6 oz (112-168 g) of lean protein each day, such as lean meat, chicken, fish, eggs, or tofu. One ounce (oz) of lean protein is equal to: ? 1 oz (28 g) of meat, chicken, or fish. ? 1 egg. ?  cup (62 g) of tofu.  Eat some foods each day that contain healthy fats, such as avocado, nuts, seeds, and fish.   What foods should I eat? Fruits Berries. Apples. Oranges. Peaches. Apricots. Plums. Grapes. Mango. Papaya. Pomegranate. Kiwi. Cherries. Vegetables Lettuce. Spinach. Leafy greens, including kale, chard, collard greens, and mustard greens. Beets. Cauliflower. Cabbage. Broccoli. Carrots. Green beans. Tomatoes. Peppers. Onions. Cucumbers. Brussels sprouts. Grains Whole grains, such as whole-wheat or whole-grain bread, crackers, tortillas, cereal, and pasta. Unsweetened oatmeal. Quinoa. Brown or wild rice. Meats and other proteins Seafood. Poultry without skin. Lean cuts of poultry and beef. Tofu. Nuts. Seeds. Dairy Low-fat or fat-free dairy products such as milk, yogurt, and cheese. The items listed above may not be a complete list of foods and beverages you can eat. Contact a dietitian for more information. What foods should I avoid? Fruits Fruits canned with  syrup. Vegetables Canned vegetables. Frozen vegetables with butter or cream sauce. Grains Refined white flour and flour products such as bread, pasta, snack foods, and cereals. Avoid all processed foods. Meats and other proteins Fatty cuts of meat. Poultry with skin. Breaded or fried meats. Processed meat. Avoid saturated fats. Dairy Full-fat yogurt, cheese, or milk. Beverages Sweetened drinks, such as soda or iced tea. The items listed above may not be a complete list of foods and beverages you should avoid. Contact a dietitian for more information. Questions to ask a health care provider  Do I need to meet with a diabetes educator?  Do I need to meet with a dietitian?  What number can I call if I have questions?  When are the best times to check my blood glucose? Where to find more information:  American Diabetes Association: diabetes.org  Academy of Nutrition and Dietetics: www.eatright.org  National Institute of Diabetes and Digestive and Kidney Diseases: www.niddk.nih.gov  Association of Diabetes Care and Education Specialists: www.diabeteseducator.org Summary  It is important to have healthy eating   habits because your blood sugar (glucose) levels are greatly affected by what you eat and drink.  A healthy meal plan will help you control your blood glucose and maintain a healthy lifestyle.  Your health care provider may recommend that you work with a dietitian to make a meal plan that is best for you.  Keep in mind that carbohydrates (carbs) and alcohol have immediate effects on your blood glucose levels. It is important to count carbs and to use alcohol carefully. This information is not intended to replace advice given to you by your health care provider. Make sure you discuss any questions you have with your health care provider. Document Revised: 08/17/2019 Document Reviewed: 08/17/2019 Elsevier Patient Education  2021 Elsevier Inc.  

## 2021-02-15 NOTE — Assessment & Plan Note (Signed)
Well-controlled hypertension.  Continue losartan 50 mg daily, metoprolol succinate 25 mg daily. Hemoglobin A1c still not at target but better than before at 7.1.  Continue glipizide 5 mg twice a day.  Patient requesting metformin.  We will start 500 mg twice a day. Ozempic option discussed with patient. Diet and nutrition discussed. Follow-up in 3 months.

## 2021-02-15 NOTE — Assessment & Plan Note (Signed)
Intolerant to statins.  Failed Praluent.  We will start Zetia 10 mg daily. Diet and nutrition discussed.

## 2021-02-15 NOTE — Progress Notes (Signed)
Gregory Mathews. 75 y.o.   Chief Complaint  Patient presents with  . Diabetes    3 month follow up. Pt would like to discuss a few medication s he is taking, jardiance and praluent. Pt also needs a refill on glipizide and losartan    HISTORY OF PRESENT ILLNESS: This is a 75 y.o. male with history of diabetes here for follow-up. Presently taking glipizide 5 mg twice a day. Had to stop Jardiance due to dehydration and also genital infection.  Off Jardiance for 6 to 7 weeks. Praluent medication gave him side effects and he stopped this as well. Otherwise doing well.  No other complaints or medical concerns today.  HPI   Prior to Admission medications   Medication Sig Start Date End Date Taking? Authorizing Provider  blood glucose meter kit and supplies Use to test blood sugar daily. Dx: E11.9 10/11/15  Yes Tereasa Coop, PA-C  clopidogrel (PLAVIX) 75 MG tablet TAKE 1 TABLET BY MOUTH EVERY DAY 02/05/21  Yes Greely Atiyeh, Ines Bloomer, MD  glipiZIDE (GLUCOTROL) 5 MG tablet Take 1 tablet (5 mg total) by mouth 2 (two) times daily before a meal. 11/16/20 02/14/21 Yes Keiland Pickering, Ines Bloomer, MD  Lancets MISC Use to test blood sugar daily. Dx code :E11.9 07/08/19  Yes Jhaden Pizzuto, Ines Bloomer, MD  losartan (COZAAR) 50 MG tablet Take 1 tablet (50 mg total) by mouth daily. 11/16/20 02/14/21 Yes Kobie Whidby, Ines Bloomer, MD  metoprolol succinate (TOPROL-XL) 25 MG 24 hr tablet Take 1 tablet (25 mg total) by mouth daily. 11/16/20  Yes Champ Keetch, Ines Bloomer, MD  Multiple Vitamin (MULTIVITAMIN) tablet Take 1 tablet by mouth daily.   Yes [provider]  [facility] ULTRA test strip USE TO TEST BLOOD SUGAR ONCE DAILY 09/14/20  Yes Zo Loudon, Ines Bloomer, MD  Continuous Blood Gluc Sensor (FREESTYLE LIBRE 2 SENSOR) MISC Apply 1 sensor ever 14 days Patient not taking: Reported on 02/15/2021 12/01/20   Horald Pollen, MD  empagliflozin (JARDIANCE) 10 MG TABS tablet Take 1 tablet (10 mg total) by mouth  daily. Patient not taking: Reported on 02/15/2021 11/16/20   Horald Pollen, MD  PRALUENT 150 MG/ML SOAJ INJECT 150 MG INTO THE SKIN EVERY 14 (FOURTEEN) DAYS. Patient not taking: Reported on 02/15/2021 09/18/20   Leonie Man, MD    Allergies  Allergen Reactions  . Januvia [Sitagliptin] Other (See Comments)    Gi upset and kidney problems  . Lisinopril Cough  . Statins Other (See Comments)    Severe leg pain & fatigue -- tried at least 4 (even low dose)    Patient Active Problem List   Diagnosis Date Noted  . Genetic testing 01/01/2021  . Family history of colon cancer   . Family history of malignant neoplasm of breast   . Polyposis of colon   . 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  . Clotting disorder ([place]) 05/17/2020  . Skin cancer, basal cell 11/18/2019  . Uncircumcised male 03/11/2018  . Coronary artery disease involving native coronary artery of native heart with angina pectoris ([place]) 11/04/2017  . Abnormal findings on diagnostic imaging of cardiovascular system 11/04/2017  . Agatston coronary artery calcium score between 100 and 199 09/22/2017  . Family history of early CAD 09/11/2017  . Mild aortic stenosis by prior echocardiogram 09/11/2017  . Metabolic syndrome 49/70/2637  . 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 BMI 31.0-31.9,adult 03/13/2016  . Hypertension associated with diabetes ([place]) 08/29/2015  . Diverticulosis of colon without hemorrhage 08/01/2015  . Statin intolerance  08/01/2015  . Basal cell carcinoma of nose 08/23/2014  . Rosacea 08/23/2014  . Seasonal allergies 01/26/2013  . Type 2 diabetes mellitus with hyperglycemia, without long-term current use of insulin ([place]) 07/09/2012  . Hyperlipidemia associated with type 2 diabetes mellitus ([place]) 07/09/2012  . Erectile dysfunction 07/09/2012    Past Medical History:  Diagnosis Date  . Allergy   . Basal cell carcinoma 01/2013   Nasal and R forearm.  [facility]  . CAD S/P percutaneous coronary  angioplasty    2/19 PCI/DESx1 to mLAD, FFR 0.7 -[place] DES 2.75 mm x 18 mm  . Cataract   . Diabetes mellitus without complication (HCC)    on PO Meds  . Essential hypertension   . Family history of colon cancer   . Family history of malignant neoplasm of breast   . Heart murmur   . Hyperlipidemia    statin intolerant -- myalgias, fatigue (tried at least 4)  . Polyposis of colon   . Rosacea     Past Surgical History:  Procedure Laterality Date  . basal cell removal     nose and wrist   . BELPHAROPTOSIS REPAIR    . CORONARY CT ANGIOGRAM  09/2017   Coronary Calcium Score: 111. Coronary calcification noted in the LEFT MAIN (LM) and prox LAD.  LM < 50% calcified stenosis.  Mid LAD 50-75% plaque noted CT FFR --> positive at 0.80.  Recommend CARDIAC CATHETERIZATION  . CORONARY STENT INTERVENTION N/A 11/12/2017   Procedure: CORONARY STENT INTERVENTION;  Surgeon: Leonie Man, MD;  Location: [place] [facility];  Service: Cardiovascular:  DES PCI:  STENT SIERRA 2.75 X 18 MM - Post intervention, there is a 0% residual stenosis.  Marland Kitchen ETT/GXT: Graded Exercise Tolerance Test  08/2015    Exercised for 9:01 min --> blunted blood pressure response no EKG changes.  No chest pain.  Low Risk  . EYE SURGERY     Cataract surgery  . eyelid surgery    . INTRAVASCULAR PRESSURE WIRE/FFR STUDY N/A 11/12/2017   Procedure: INTRAVASCULAR PRESSURE WIRE/FFR STUDY;  Surgeon: Leonie Man, MD;  Location: [place] [facility];  Service: Cardiovascular;  Laterality: LAD -FFR 0.76  . LEFT HEART CATH AND CORONARY ANGIOGRAPHY N/A 11/12/2017   Procedure: LEFT HEART CATH AND CORONARY ANGIOGRAPHY;  Surgeon: Leonie Man, MD;  Location: [facility]: 70% P-M LAD (FFR 0.76).  40% mid LAD, 50% ostial ramus.  EF 55-60%.  Mildly elevated LVEDP.  Marland Kitchen SHOULDER OPEN ROTATOR CUFF REPAIR  1983  . TRANSTHORACIC ECHOCARDIOGRAM  08/2017   EF 55-60%.  Mild aortic stenosis (mean gradient 11 mmH.)  GR 1 DD.  Mild LA dilation.     Social History   Socioeconomic History  . Marital status: Married    Spouse name: [name]  . Number of children: 4  . Years of education: 63  . Highest education level: Not on file  Occupational History  . Occupation: bus Engineer, manufacturing systems: [employer]  Tobacco Use  . Smoking status: Former Smoker    Packs/day: 1.00    Years: 15.00    Pack years: 15.00    Quit date: 07/09/1982    Years since quitting: 38.6  . Smokeless tobacco: Never Used  Substance and Sexual Activity  . Alcohol use: No  . Drug use: No  . Sexual activity: Yes    Partners: Female  Other Topics Concern  . Not on file  Social History Narrative  Marital status: married x 11 years; happily married.   Lives: with wife       Children:  4 children; 4 grandchildren. Adult children all live locally.          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      Tobacco:  In past; smoked x 15 years; quit 35 years ago.      Alcohol: none      ADLs: independent with all ADLs.  Does not use assistant device with ambulation.      Living Will:  Has living will; desires FULL CODE; no prolonged measures.      Doctoral degree in Biblical Studies.          Diet: eats twice daily, eggs,sausage,and fruits for breakfast; chicken (fried or broil), occassional pizza, salads, steak and pork chops.       Exercise: cardiac rehab, transition to [facility] - 45 minutes walks and resistance 5x/week   Social Determinants of Health   Financial Resource Strain: Not on file  Food Insecurity: Not on file  Transportation Needs: Not on file  Physical Activity: Not on file  Stress: Not on file  Social Connections: Not on file  Intimate Partner Violence: Not on file    Family History  Problem Relation Age of Onset  . Leukemia Mother 63       d. 51  . Stroke Father 59       as a complication of CABG  . Colon cancer Father 60  . Coronary  artery disease Father 21       - Sx was DOE - MV CAD  . Peripheral Artery Disease Father        presumptive  . Diabetes Sister   . Sudden Cardiac Death Sister 74       presumed MI  . Heart attack Brother 81       During throat surgery  . Congestive Heart Failure Brother        Died @ 31 - ? CHF / ICM  . Breast cancer Maternal Grandmother        dx in her 29s  . Stroke Maternal Grandfather   . Diabetes Brother 64       on lots of meds  . Diabetes Brother   . Diabetes Paternal Grandmother   . Rheumatic fever Brother        childhood RF --> Valvular Dz - valve Sgx,  died young  . Stomach cancer Neg Hx   . Esophageal cancer Neg Hx      Review of Systems  Constitutional: Negative.  Negative for chills and fever.  HENT: Negative.  Negative for congestion and sore throat.   Respiratory: Negative.  Negative for cough and shortness of breath.   Cardiovascular: Negative.  Negative for chest pain and palpitations.  Gastrointestinal: Negative for abdominal pain, diarrhea, nausea and vomiting.  Genitourinary: Negative.  Negative for dysuria and hematuria.  Skin: Negative.  Negative for rash.  Neurological: Negative.  Negative for dizziness and headaches.  All other systems reviewed and are negative.   Today's Vitals   02/15/21 1537  BP: 122/60  Pulse: 73  Temp: 98 F (36.7 C)  TempSrc: Oral  SpO2: 97%  Weight: 210 lb (95.3 kg)  Height: '5\' 10"'  (1.778 m)   Body mass index is 30.13 kg/m. Wt Readings from Last 3 Encounters:  02/15/21 210 lb (95.3 kg)  11/16/20 211 lb (95.7 kg)  11/13/20 213 lb (96.6 kg)    Physical Exam Vitals reviewed.  Constitutional:      Appearance: Normal appearance.  HENT:     Head: Normocephalic.  Eyes:     Extraocular Movements: Extraocular movements intact.     Conjunctiva/sclera: Conjunctivae normal.     Pupils: Pupils are equal, round, and reactive to light.  Cardiovascular:     Rate and Rhythm: Normal rate and regular rhythm.     Pulses:  Normal pulses.     Heart sounds: Normal heart sounds.  Pulmonary:     Effort: Pulmonary effort is normal.     Breath sounds: Normal breath sounds.  Musculoskeletal:        General: Normal range of motion.     Cervical back: Normal range of motion and neck supple.  Skin:    General: Skin is warm and dry.     Capillary Refill: Capillary refill takes less than 2 seconds.  Neurological:     General: No focal deficit present.     Mental Status: He is alert and oriented to person, place, and time.  Psychiatric:        Mood and Affect: Mood normal.        Behavior: Behavior normal.    Results for orders placed or performed in visit on 02/15/21 (from the past 24 hour(s))  POCT glycosylated hemoglobin (Hb A1C)     Status: Abnormal   Collection Time: 02/15/21  4:00 PM  Result Value Ref Range   Hemoglobin A1C 7.1 (A) 4.0 - 5.6 %   HbA1c POC (<> result, manual entry)     HbA1c, POC (prediabetic range)     HbA1c, POC (controlled diabetic range)       ASSESSMENT & PLAN: Hypertension associated with diabetes ([place]) Well-controlled hypertension.  Continue losartan 50 mg daily, metoprolol succinate 25 mg daily. Hemoglobin A1c still not at target but better than before at 7.1.  Continue glipizide 5 mg twice a day.  Patient requesting metformin.  We will start 500 mg twice a day. Ozempic option discussed with patient. Diet and nutrition discussed. Follow-up in 3 months.  Hyperlipidemia associated with type 2 diabetes mellitus ([place]) Intolerant to statins.  Failed Praluent.  We will start Zetia 10 mg daily. Diet and nutrition discussed.  Cedar was seen today for diabetes.  Diagnoses and all orders for this visit:  Hypertension associated with diabetes ([place]) -     POCT glycosylated hemoglobin (Hb A1C) -     metFORMIN (GLUCOPHAGE) 500 MG tablet; Take 1 tablet (500 mg total) by mouth 2 (two) times daily with a meal. -     losartan (COZAAR) 50 MG tablet; Take 1 tablet (50 mg total) by mouth  daily. -     Continuous Blood Gluc Sensor (FREESTYLE LIBRE 2 SENSOR) MISC; Apply 1 sensor ever 14 days  Hyperlipidemia associated with type 2 diabetes mellitus (HCC) -     ezetimibe (ZETIA) 10 MG tablet; Take 1 tablet (10 mg total) by mouth daily.  Atherosclerosis of aorta (HCC)  Type 2 diabetes mellitus with hyperglycemia, without long-term current use of insulin (HCC) -     glipiZIDE (GLUCOTROL) 5 MG tablet; Take 1 tablet (5 mg total) by mouth 2 (two) times daily before a meal.  Statin myopathy    Patient Instructions   Diabetes Mellitus and Nutrition, Adult When you have diabetes, or diabetes mellitus, it is very important to have healthy eating habits because your blood sugar (glucose) levels are greatly affected  by what you eat and drink. Eating healthy foods in the right amounts, at about the same times every day, can help you:  Control your blood glucose.  Lower your risk of heart disease.  Improve your blood pressure.  Reach or maintain a healthy weight. What can affect my meal plan? Every person with diabetes is different, and each person has different needs for a meal plan. Your health care provider may recommend that you work with a dietitian to make a meal plan that is best for you. Your meal plan may vary depending on factors such as:  The calories you need.  The medicines you take.  Your weight.  Your blood glucose, blood pressure, and cholesterol levels.  Your activity level.  Other health conditions you have, such as heart or kidney disease. How do carbohydrates affect me? Carbohydrates, also called carbs, affect your blood glucose level more than any other type of food. Eating carbs naturally raises the amount of glucose in your blood. Carb counting is a method for keeping track of how many carbs you eat. Counting carbs is important to keep your blood glucose at a healthy level, especially if you use insulin or take certain oral diabetes medicines. It is  important to know how many carbs you can safely have in each meal. This is different for every person. Your dietitian can help you calculate how many carbs you should have at each meal and for each snack. How does alcohol affect me? Alcohol can cause a sudden decrease in blood glucose (hypoglycemia), especially if you use insulin or take certain oral diabetes medicines. Hypoglycemia can be a life-threatening condition. Symptoms of hypoglycemia, such as sleepiness, dizziness, and confusion, are similar to symptoms of having too much alcohol.  Do not drink alcohol if: ? Your health care provider tells you not to drink. ? You are pregnant, may be pregnant, or are planning to become pregnant.  If you drink alcohol: ? Do not drink on an empty stomach. ? Limit how much you use to:  0-1 drink a day for women.  0-2 drinks a day for men. ? Be aware of how much alcohol is in your drink. In the [place] one drink equals one 12 oz bottle of beer (355 mL), one 5 oz glass of wine (148 mL), or one 1 oz glass of hard liquor (44 mL). ? Keep yourself hydrated with water, diet soda, or unsweetened iced tea.  Keep in mind that regular soda, juice, and other mixers may contain a lot of sugar and must be counted as carbs. What are tips for following this plan? Reading food labels  Start by checking the serving size on the "Nutrition Facts" label of packaged foods and drinks. The amount of calories, carbs, fats, and other nutrients listed on the label is based on one serving of the item. Many items contain more than one serving per package.  Check the total grams (g) of carbs in one serving. You can calculate the number of servings of carbs in one serving by dividing the total carbs by 15. For example, if a food has 30 g of total carbs per serving, it would be equal to 2 servings of carbs.  Check the number of grams (g) of saturated fats and trans fats in one serving. Choose foods that have a low amount or none of  these fats.  Check the number of milligrams (mg) of salt (sodium) in one serving. Most people should limit total sodium intake to  less than 2,300 mg per day.  Always check the nutrition information of foods labeled as "low-fat" or "nonfat." These foods may be higher in added sugar or refined carbs and should be avoided.  Talk to your dietitian to identify your daily goals for nutrients listed on the label. Shopping  Avoid buying canned, pre-made, or processed foods. These foods tend to be high in fat, sodium, and added sugar.  Shop around the outside edge of the grocery store. This is where you will most often find fresh fruits and vegetables, bulk grains, fresh meats, and fresh dairy. Cooking  Use low-heat cooking methods, such as baking, instead of high-heat cooking methods like deep frying.  Cook using healthy oils, such as olive, canola, or sunflower oil.  Avoid cooking with butter, cream, or high-fat meats. Meal planning  Eat meals and snacks regularly, preferably at the same times every day. Avoid going long periods of time without eating.  Eat foods that are high in fiber, such as fresh fruits, vegetables, beans, and whole grains. Talk with your dietitian about how many servings of carbs you can eat at each meal.  Eat 4-6 oz (112-168 g) of lean protein each day, such as lean meat, chicken, fish, eggs, or tofu. One ounce (oz) of lean protein is equal to: ? 1 oz (28 g) of meat, chicken, or fish. ? 1 egg. ?  cup (62 g) of tofu.  Eat some foods each day that contain healthy fats, such as avocado, nuts, seeds, and fish.   What foods should I eat? Fruits Berries. Apples. Oranges. Peaches. Apricots. Plums. Grapes. Mango. Papaya. Pomegranate. Kiwi. Cherries. Vegetables Lettuce. Spinach. Leafy greens, including kale, chard, collard greens, and mustard greens. Beets. Cauliflower. Cabbage. Broccoli. Carrots. Green beans. Tomatoes. Peppers. Onions. Cucumbers. Brussels  sprouts. Grains Whole grains, such as whole-wheat or whole-grain bread, crackers, tortillas, cereal, and pasta. Unsweetened oatmeal. Quinoa. Brown or wild rice. Meats and other proteins Seafood. Poultry without skin. Lean cuts of poultry and beef. Tofu. Nuts. Seeds. Dairy Low-fat or fat-free dairy products such as milk, yogurt, and cheese. The items listed above may not be a complete list of foods and beverages you can eat. Contact a dietitian for more information. What foods should I avoid? Fruits Fruits canned with syrup. Vegetables Canned vegetables. Frozen vegetables with butter or cream sauce. Grains Refined white flour and flour products such as bread, pasta, snack foods, and cereals. Avoid all processed foods. Meats and other proteins Fatty cuts of meat. Poultry with skin. Breaded or fried meats. Processed meat. Avoid saturated fats. Dairy Full-fat yogurt, cheese, or milk. Beverages Sweetened drinks, such as soda or iced tea. The items listed above may not be a complete list of foods and beverages you should avoid. Contact a dietitian for more information. Questions to ask a health care provider  Do I need to meet with a diabetes educator?  Do I need to meet with a dietitian?  What number can I call if I have questions?  When are the best times to check my blood glucose? Where to find more information:  American Diabetes Association: diabetes.org  Academy of Nutrition and Dietetics: www.eatright.[employer] of Diabetes and Digestive and Kidney Diseases: [employer]  Association of Diabetes Care and Education Specialists: www.diabeteseducator.org Summary  It is important to have healthy eating habits because your blood sugar (glucose) levels are greatly affected by what you eat and drink.  A healthy meal plan will help you control your blood glucose and maintain  a healthy lifestyle.  Your health care provider may recommend that you work with a  dietitian to make a meal plan that is best for you.  Keep in mind that carbohydrates (carbs) and alcohol have immediate effects on your blood glucose levels. It is important to count carbs and to use alcohol carefully. This information is not intended to replace advice given to you by your health care provider. Make sure you discuss any questions you have with your health care provider. Document Revised: 08/17/2019 Document Reviewed: 08/17/2019 Elsevier Patient Education  2021 [place], [place] [facility]

## 2021-05-20 ENCOUNTER — Other Ambulatory Visit: Payer: Self-pay | Admitting: Emergency Medicine

## 2021-05-20 DIAGNOSIS — E1165 Type 2 diabetes mellitus with hyperglycemia: Secondary | ICD-10-CM

## 2021-05-24 ENCOUNTER — Other Ambulatory Visit: Payer: Self-pay

## 2021-05-24 ENCOUNTER — Encounter: Payer: Self-pay | Admitting: Emergency Medicine

## 2021-05-24 ENCOUNTER — Ambulatory Visit (INDEPENDENT_AMBULATORY_CARE_PROVIDER_SITE_OTHER): Payer: Medicare Other | Admitting: Emergency Medicine

## 2021-05-24 VITALS — BP 126/78 | HR 63 | Temp 97.9°F | Ht 70.0 in | Wt 212.0 lb

## 2021-05-24 DIAGNOSIS — Z23 Encounter for immunization: Secondary | ICD-10-CM | POA: Diagnosis not present

## 2021-05-24 DIAGNOSIS — I700 Atherosclerosis of aorta: Secondary | ICD-10-CM

## 2021-05-24 DIAGNOSIS — G720 Drug-induced myopathy: Secondary | ICD-10-CM | POA: Diagnosis not present

## 2021-05-24 DIAGNOSIS — T466X5A Adverse effect of antihyperlipidemic and antiarteriosclerotic drugs, initial encounter: Secondary | ICD-10-CM | POA: Diagnosis not present

## 2021-05-24 DIAGNOSIS — I152 Hypertension secondary to endocrine disorders: Secondary | ICD-10-CM | POA: Diagnosis not present

## 2021-05-24 DIAGNOSIS — I25119 Atherosclerotic heart disease of native coronary artery with unspecified angina pectoris: Secondary | ICD-10-CM

## 2021-05-24 DIAGNOSIS — E785 Hyperlipidemia, unspecified: Secondary | ICD-10-CM | POA: Diagnosis not present

## 2021-05-24 DIAGNOSIS — E1159 Type 2 diabetes mellitus with other circulatory complications: Secondary | ICD-10-CM

## 2021-05-24 DIAGNOSIS — E1169 Type 2 diabetes mellitus with other specified complication: Secondary | ICD-10-CM

## 2021-05-24 DIAGNOSIS — Z789 Other specified health status: Secondary | ICD-10-CM

## 2021-05-24 LAB — POCT GLYCOSYLATED HEMOGLOBIN (HGB A1C): Hemoglobin A1C: 6.9 % — AB (ref 4.0–5.6)

## 2021-05-24 NOTE — Assessment & Plan Note (Signed)
Diet and nutrition discussed.  Patient has history of coronary artery disease and atherosclerosis of aorta.  Intolerant to statins and also Repatha. Presently on Zetia 10 mg daily.

## 2021-05-24 NOTE — Progress Notes (Signed)
BP Readings from Last 3 Encounters:  05/24/21 126/78  02/15/21 122/60  11/16/20 133/72   Wt Readings from Last 3 Encounters:  05/24/21 212 lb (96.2 kg)  02/15/21 210 lb (95.3 kg)  11/16/20 211 lb (95.7 kg)   Lab Results  Component Value Date   HGBA1C 6.9 (A) 05/24/2021   Wt Readings from Last 3 Encounters:  05/24/21 212 lb (96.2 kg)  02/15/21 210 lb (95.3 kg)  11/16/20 211 lb (95.7 kg)   Gregory Gail Jr. 75 y.o.   Chief Complaint  Patient presents with   Diabetes    Follow up    HISTORY OF PRESENT ILLNESS: This is a 75 y.o. male with history of diabetes and hypertension here for follow-up. #1 diabetes: Presently on metformin 500 mg twice a day and glipizide 5 mg twice a day Doing well.  Hemoglobin A1c today is 6.9 better than before. #2 hypertension on losartan 50 mg daily and metoprolol succinate 25 mg daily. #3 dyslipidemia on Zetia 10 mg daily. Has no complaints or medical concerns today.  Diabetes Pertinent negatives for hypoglycemia include no dizziness or headaches. Pertinent negatives for diabetes include no chest pain.    Prior to Admission medications   Medication Sig Start Date End Date Taking? Authorizing Provider  blood glucose meter kit and supplies Use to test blood sugar daily. Dx: E11.9 10/11/15  Yes Tereasa Coop, PA-C  clopidogrel (PLAVIX) 75 MG tablet TAKE 1 TABLET BY MOUTH EVERY DAY 02/05/21  Yes Elexa Kivi, Ines Bloomer, MD  Continuous Blood Gluc Sensor (FREESTYLE LIBRE 2 SENSOR) MISC Apply 1 sensor ever 14 days 02/15/21  Yes Marinell Igarashi, Ines Bloomer, MD  empagliflozin (JARDIANCE) 10 MG TABS tablet Take 1 tablet (10 mg total) by mouth daily. 11/16/20  Yes Jatin Naumann, Ines Bloomer, MD  ezetimibe (ZETIA) 10 MG tablet Take 1 tablet (10 mg total) by mouth daily. 02/15/21  Yes Horald Pollen, MD  Lancets MISC Use to test blood sugar daily. Dx code :E11.9 07/08/19  Yes Horald Pollen, MD  metFORMIN (GLUCOPHAGE) 500 MG tablet Take 1 tablet (500 mg  total) by mouth 2 (two) times daily with a meal. 02/15/21  Yes Minervia Osso, Ines Bloomer, MD  metoprolol succinate (TOPROL-XL) 25 MG 24 hr tablet Take 1 tablet (25 mg total) by mouth daily. 11/16/20  Yes Kaliq Lege, Ines Bloomer, MD  Multiple Vitamin (MULTIVITAMIN) tablet Take 1 tablet by mouth daily.   Yes [provider]  [facility] ULTRA test strip USE TO TEST BLOOD SUGAR ONCE DAILY 09/14/20  Yes Ailey Wessling, Ines Bloomer, MD  glipiZIDE (GLUCOTROL) 5 MG tablet Take 1 tablet (5 mg total) by mouth 2 (two) times daily before a meal. 02/15/21 05/16/21  Agastya Meister, Ines Bloomer, MD  losartan (COZAAR) 50 MG tablet Take 1 tablet (50 mg total) by mouth daily. 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   Clotting disorder ([place]) 05/17/2020   Skin cancer, basal cell  11/18/2019   Uncircumcised male 03/11/2018   Coronary artery disease involving native coronary artery of native heart with angina pectoris ([place]) 11/04/2017   Abnormal findings on diagnostic imaging of cardiovascular system 11/04/2017   Agatston coronary artery calcium score between 100 and 199 09/22/2017   Family history of early CAD 09/11/2017   Mild aortic stenosis by prior echocardiogram 01/74/9449   Metabolic syndrome 67/59/1638   Hypogonadism in male 08/14/2016   BMI 31.0-31.9,adult 03/13/2016   Hypertension associated with diabetes ([place]) 08/29/2015   Diverticulosis of colon without hemorrhage 08/01/2015   Statin intolerance 08/01/2015   Basal cell  carcinoma of nose 08/23/2014   Rosacea 08/23/2014   Seasonal allergies 01/26/2013   Type 2 diabetes mellitus with hyperglycemia, without long-term current use of insulin ([place]) 07/09/2012   Hyperlipidemia associated with type 2 diabetes mellitus ([place]) 07/09/2012   Erectile dysfunction 07/09/2012    Past Medical History:  Diagnosis Date   Allergy    Basal cell carcinoma 01/2013   Nasal and R forearm.  [facility]   CAD S/P percutaneous coronary angioplasty    2/19 PCI/DESx1 to mLAD, FFR 0.7 -[place] DES 2.75 mm x 18 mm   Cataract    Diabetes mellitus without complication (HCC)    on PO Meds   Essential hypertension    Family history of colon cancer    Family history of malignant neoplasm of breast    Heart murmur    Hyperlipidemia    statin intolerant -- myalgias, fatigue (tried at least 4)   Polyposis of colon    Rosacea     Past Surgical History:  Procedure Laterality Date   basal cell removal     nose and wrist    BELPHAROPTOSIS REPAIR     CORONARY CT ANGIOGRAM  09/2017   Coronary Calcium Score: 111. Coronary calcification noted in the LEFT MAIN (LM) and prox LAD.  LM < 50% calcified stenosis.  Mid LAD 50-75% plaque noted CT FFR --> positive at 0.80.  Recommend CARDIAC CATHETERIZATION   CORONARY STENT INTERVENTION N/A 11/12/2017   Procedure: CORONARY STENT INTERVENTION;  Surgeon: Leonie Man, MD;  Location: [place] [facility];  Service: Cardiovascular:  DES PCI:  STENT SIERRA 2.75 X 18 MM - Post intervention, there is a 0% residual stenosis.   ETT/GXT: Graded Exercise Tolerance Test  08/2015    Exercised for 9:01 min --> blunted blood pressure response no EKG changes.  No chest pain.  Low Risk   EYE SURGERY     Cataract surgery   eyelid surgery     INTRAVASCULAR PRESSURE WIRE/FFR STUDY N/A 11/12/2017   Procedure: INTRAVASCULAR PRESSURE WIRE/FFR STUDY;  Surgeon: Leonie Man, MD;  Location: [place] [facility];  Service: Cardiovascular;  Laterality: LAD -FFR  0.76   LEFT HEART CATH AND CORONARY ANGIOGRAPHY N/A 11/12/2017   Procedure: LEFT HEART CATH AND CORONARY ANGIOGRAPHY;  Surgeon: Leonie Man, MD;  Location: [facility]: 70% P-M LAD (FFR 0.76).  40% mid LAD, 50% ostial ramus.  EF 55-60%.  Mildly elevated LVEDP.   SHOULDER OPEN ROTATOR CUFF REPAIR  1983   TRANSTHORACIC ECHOCARDIOGRAM  08/2017   EF 55-60%.  Mild aortic stenosis (mean gradient 11 mmH.)  GR 1 DD.  Mild LA dilation.    Social History   Socioeconomic History   Marital status: Married    Spouse name: [name]   Number of children: 4   Years of education: 20   Highest education level: Not on  file  Occupational History   Occupation: bus Engineer, manufacturing systems: [employer]  Tobacco Use   Smoking status: Former    Packs/day: 1.00    Years: 15.00    Pack years: 15.00    Types: Cigarettes    Quit date: 07/09/1982    Years since quitting: 38.9   Smokeless tobacco: Never  Substance and Sexual Activity   Alcohol use: No   Drug use: No   Sexual activity: Yes    Partners: Female  Other Topics Concern   Not on file  Social History Narrative   Marital status: married x 62 years; happily married.   Lives: with wife       Children:  4 children; 4 grandchildren. Adult children all live locally.          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      Tobacco:  In past; smoked x 15 years; quit 35 years ago.      Alcohol: none      ADLs: independent with all ADLs.  Does not use assistant device with ambulation.      Living Will:  Has living will; desires FULL CODE; no prolonged measures.      Doctoral degree in Biblical Studies.          Diet: eats twice daily, eggs,sausage,and fruits for breakfast; chicken (fried or broil), occassional pizza, salads, steak and pork chops.        Exercise: cardiac rehab, transition to [facility] - 45 minutes walks and resistance 5x/week   Social  Determinants of Health   Financial Resource Strain: Not on file  Food Insecurity: Not on file  Transportation Needs: Not on file  Physical Activity: Not on file  Stress: Not on file  Social Connections: Not on file  Intimate Partner Violence: Not on file    Family History  Problem Relation Age of Onset   Leukemia Mother 19       d. 13   Stroke Father 15       as a complication of CABG   Colon cancer Father 48   Coronary artery disease Father 33       - Sx was DOE - MV CAD   Peripheral Artery Disease Father        presumptive   Diabetes Sister    Sudden Cardiac Death Sister 26       presumed MI   Heart attack Brother 81       During throat surgery   Congestive Heart Failure Brother        Died @ 64 - ? CHF / ICM   Breast cancer Maternal Grandmother        dx in her 21s   Stroke Maternal Grandfather    Diabetes Brother 67       on lots of meds   Diabetes Brother    Diabetes Paternal Grandmother    Rheumatic fever Brother        childhood RF --> Valvular Dz - valve Sgx,  died young   Stomach cancer Neg Hx    Esophageal cancer Neg Hx      Review of Systems  Constitutional: Negative.  Negative for chills and fever.  HENT: Negative.  Negative for congestion and sore throat.   Respiratory: Negative.  Negative for cough and shortness of breath.   Cardiovascular: Negative.  Negative for chest pain and palpitations.  Gastrointestinal:  Negative  for abdominal pain, nausea and vomiting.  Genitourinary: Negative.  Negative for dysuria and hematuria.  Skin: Negative.  Negative for rash.  Neurological:  Negative for dizziness and headaches.  All other systems reviewed and are negative.   Physical Exam Vitals reviewed.  Constitutional:      Appearance: Normal appearance.  HENT:     Head: Normocephalic.  Eyes:     Extraocular Movements: Extraocular movements intact.     Pupils: Pupils are equal, round, and reactive to light.  Cardiovascular:     Rate and Rhythm: Normal  rate and regular rhythm.     Pulses: Normal pulses.     Heart sounds: Murmur (Systolic 3/6 murmur) heard.  Pulmonary:     Effort: Pulmonary effort is normal.     Breath sounds: Normal breath sounds.  Musculoskeletal:     Cervical back: Normal range of motion.  Skin:    General: Skin is warm and dry.     Capillary Refill: Capillary refill takes less than 2 seconds.  Neurological:     General: No focal deficit present.     Mental Status: He is alert and oriented to person, place, and time.  Psychiatric:        Mood and Affect: Mood normal.        Behavior: Behavior normal.    Results for orders placed or performed in visit on 05/24/21 (from the past 24 hour(s))  POCT glycosylated hemoglobin (Hb A1C)     Status: Abnormal   Collection Time: 05/24/21  8:17 AM  Result Value Ref Range   Hemoglobin A1C 6.9 (A) 4.0 - 5.6 %   HbA1c POC (<> result, manual entry)     HbA1c, POC (prediabetic range)     HbA1c, POC (controlled diabetic range)      ASSESSMENT & PLAN: Hypertension associated with diabetes ([place]) Well-controlled hypertension.  Continue losartan 50 and metoprolol succinate 25 mg daily. BP Readings from Last 3 Encounters:  05/24/21 126/78  02/15/21 122/60  11/16/20 133/72  Well-controlled diabetes with hemoglobin A1c of 6.9, better than before. Continue metformin 500 mg twice a day and glipizide 5 mg twice a day. Diet and nutrition discussed. Follow-up in 6 months.   Hyperlipidemia associated with type 2 diabetes mellitus ([place]) Diet and nutrition discussed.  Patient has history of coronary artery disease and atherosclerosis of aorta.  Intolerant to statins and also Repatha. Presently on Zetia 10 mg daily.  Kanin was seen today for diabetes.  Diagnoses and all orders for this visit:  Hypertension associated with diabetes ([place]) -     POCT glycosylated hemoglobin (Hb A1C)  Statin myopathy  Statin intolerance  Hyperlipidemia associated with type 2 diabetes mellitus  (HCC)  Coronary artery disease involving native coronary artery of native heart with angina pectoris ([place])  Atherosclerosis of aorta [facility])  Patient Instructions  Health Maintenance After Age 86 After age 26, you are at a higher risk for certain long-term diseases and infections as well as injuries from falls. Falls are a major cause of broken bones and head injuries in people who are older than age 37. Getting regular preventive care can help to keep you healthy and well. Preventive care includes getting regular testing and making lifestyle changes as recommended by your health care provider. Talk with your health care provider about: Which screenings and tests you should have. A screening is a test that checks for a disease when you have no symptoms. A diet and exercise plan that is right for you.  What should I know about screenings and tests to prevent falls? Screening and testing are the best ways to find a health problem early. Early diagnosis and treatment give you the best chance of managing medical conditions that are common after age 54. Certain conditions and lifestyle choices may make you more likely to have a fall. Your health care provider may recommend: Regular vision checks. Poor vision and conditions such as cataracts can make you more likely to have a fall. If you wear glasses, make sure to get your prescription updated if your vision changes. Medicine review. Work with your health care provider to regularly review all of the medicines you are taking, including over-the-counter medicines. Ask your health care provider about any side effects that may make you more likely to have a fall. Tell your health care provider if any medicines that you take make you feel dizzy or sleepy. Osteoporosis screening. Osteoporosis is a condition that causes the bones to get weaker. This can make the bones weak and cause them to break more easily. Blood pressure screening. Blood pressure changes and  medicines to control blood pressure can make you feel dizzy. Strength and balance checks. Your health care provider may recommend certain tests to check your strength and balance while standing, walking, or changing positions. Foot health exam. Foot pain and numbness, as well as not wearing proper footwear, can make you more likely to have a fall. Depression screening. You may be more likely to have a fall if you have a fear of falling, feel emotionally low, or feel unable to do activities that you used to do. Alcohol use screening. Using too much alcohol can affect your balance and may make you more likely to have a fall. What actions can I take to lower my risk of falls? General instructions Talk with your health care provider about your risks for falling. Tell your health care provider if: You fall. Be sure to tell your health care provider about all falls, even ones that seem minor. You feel dizzy, sleepy, or off-balance. Take over-the-counter and prescription medicines only as told by your health care provider. These include any supplements. Eat a healthy diet and maintain a healthy weight. A healthy diet includes low-fat dairy products, low-fat (lean) meats, and fiber from whole grains, beans, and lots of fruits and vegetables. Home safety Remove any tripping hazards, such as rugs, cords, and clutter. Install safety equipment such as grab bars in bathrooms and safety rails on stairs. Keep rooms and walkways well-lit. Activity  Follow a regular exercise program to stay fit. This will help you maintain your balance. Ask your health care provider what types of exercise are appropriate for you. If you need a cane or walker, use it as recommended by your health care provider. Wear supportive shoes that have nonskid soles. Lifestyle Do not drink alcohol if your health care provider tells you not to drink. If you drink alcohol, limit how much you have: 0-1 drink a day for women. 0-2 drinks a  day for men. Be aware of how much alcohol is in your drink. In the [place] one drink equals one typical bottle of beer (12 oz), one-half glass of wine (5 oz), or one shot of hard liquor (1 oz). Do not use any products that contain nicotine or tobacco, such as cigarettes and e-cigarettes. If you need help quitting, ask your health care provider. Summary Having a healthy lifestyle and getting preventive care can help to protect your health and wellness  after age 5. Screening and testing are the best way to find a health problem early and help you avoid having a fall. Early diagnosis and treatment give you the best chance for managing medical conditions that are more common for people who are older than age 99. Falls are a major cause of broken bones and head injuries in people who are older than age 51. Take precautions to prevent a fall at home. Work with your health care provider to learn what changes you can make to improve your health and wellness and to prevent falls. This information is not intended to replace advice given to you by your health care provider. Make sure you discuss any questions you have with your health care provider. Document Revised: 11/17/2020 Document Reviewed: 08/25/2020 Elsevier Patient Education  2022 [place], MD [facility]

## 2021-05-24 NOTE — Assessment & Plan Note (Signed)
Well-controlled hypertension.  Continue losartan 50 and metoprolol succinate 25 mg daily. BP Readings from Last 3 Encounters:  05/24/21 126/78  02/15/21 122/60  11/16/20 133/72  Well-controlled diabetes with hemoglobin A1c of 6.9, better than before. Continue metformin 500 mg twice a day and glipizide 5 mg twice a day. Diet and nutrition discussed. Follow-up in 6 months.

## 2021-05-24 NOTE — Patient Instructions (Signed)
Health Maintenance After Age 73 After age 73, you are at a higher risk for certain long-term diseases and infections as well as injuries from falls. Falls are a major cause of broken bones and head injuries in people who are older than age 73. Getting regular preventive care can help to keep you healthy and well. Preventive care includes getting regular testing and making lifestyle changes as recommended by your health care provider. Talk with your health care provider about: Which screenings and tests you should have. A screening is a test that checks for a disease when you have no symptoms. A diet and exercise plan that is right for you. What should I know about screenings and tests to prevent falls? Screening and testing are the best ways to find a health problem early. Early diagnosis and treatment give you the best chance of managing medical conditions that are common after age 73. Certain conditions and lifestyle choices may make you more likely to have a fall. Your health care provider may recommend: Regular vision checks. Poor vision and conditions such as cataracts can make you more likely to have a fall. If you wear glasses, make sure to get your prescription updated if your vision changes. Medicine review. Work with your health care provider to regularly review all of the medicines you are taking, including over-the-counter medicines. Ask your health care provider about any side effects that may make you more likely to have a fall. Tell your health care provider if any medicines that you take make you feel dizzy or sleepy. Osteoporosis screening. Osteoporosis is a condition that causes the bones to get weaker. This can make the bones weak and cause them to break more easily. Blood pressure screening. Blood pressure changes and medicines to control blood pressure can make you feel dizzy. Strength and balance checks. Your health care provider may recommend certain tests to check your strength and  balance while standing, walking, or changing positions. Foot health exam. Foot pain and numbness, as well as not wearing proper footwear, can make you more likely to have a fall. Depression screening. You may be more likely to have a fall if you have a fear of falling, feel emotionally low, or feel unable to do activities that you used to do. Alcohol use screening. Using too much alcohol can affect your balance and may make you more likely to have a fall. What actions can I take to lower my risk of falls? General instructions Talk with your health care provider about your risks for falling. Tell your health care provider if: You fall. Be sure to tell your health care provider about all falls, even ones that seem minor. You feel dizzy, sleepy, or off-balance. Take over-the-counter and prescription medicines only as told by your health care provider. These include any supplements. Eat a healthy diet and maintain a healthy weight. A healthy diet includes low-fat dairy products, low-fat (lean) meats, and fiber from whole grains, beans, and lots of fruits and vegetables. Home safety Remove any tripping hazards, such as rugs, cords, and clutter. Install safety equipment such as grab bars in bathrooms and safety rails on stairs. Keep rooms and walkways well-lit. Activity  Follow a regular exercise program to stay fit. This will help you maintain your balance. Ask your health care provider what types of exercise are appropriate for you. If you need a cane or walker, use it as recommended by your health care provider. Wear supportive shoes that have nonskid soles. Lifestyle Do not   drink alcohol if your health care provider tells you not to drink. If you drink alcohol, limit how much you have: 0-1 drink a day for women. 0-2 drinks a day for men. Be aware of how much alcohol is in your drink. In the [place] one drink equals one typical bottle of beer (12 oz), one-half glass of wine (5 oz), or one shot of  hard liquor (1 oz). Do not use any products that contain nicotine or tobacco, such as cigarettes and e-cigarettes. If you need help quitting, ask your health care provider. Summary Having a healthy lifestyle and getting preventive care can help to protect your health and wellness after age 73. Screening and testing are the best way to find a health problem early and help you avoid having a fall. Early diagnosis and treatment give you the best chance for managing medical conditions that are more common for people who are older than age 73. Falls are a major cause of broken bones and head injuries in people who are older than age 73. Take precautions to prevent a fall at home. Work with your health care provider to learn what changes you can make to improve your health and wellness and to prevent falls. This information is not intended to replace advice given to you by your health care provider. Make sure you discuss any questions you have with your health care provider. Document Revised: 11/17/2020 Document Reviewed: 08/25/2020 Elsevier Patient Education  2022 [employer] Inc[employer]  

## 2021-06-10 ENCOUNTER — Ambulatory Visit (INDEPENDENT_AMBULATORY_CARE_PROVIDER_SITE_OTHER): Payer: Medicare Other

## 2021-06-10 DIAGNOSIS — Z0000 Encounter for general adult medical examination without abnormal findings: Secondary | ICD-10-CM | POA: Diagnosis not present

## 2021-06-10 NOTE — Patient Instructions (Signed)
Health Maintenance, Male Adopting a healthy lifestyle and getting preventive care are important in promoting health and wellness. Ask your health care provider about: The right schedule for you to have regular tests and exams. Things you can do on your own to prevent diseases and keep yourself healthy. What should I know about diet, weight, and exercise? Eat a healthy diet  Eat a diet that includes plenty of vegetables, fruits, low-fat dairy products, and lean protein. Do not eat a lot of foods that are high in solid fats, added sugars, or sodium. Maintain a healthy weight Body mass index (BMI) is a measurement that can be used to identify possible weight problems. It estimates body fat based on height and weight. Your health care provider can help determine your BMI and help you achieve or maintain a healthy weight. Get regular exercise Get regular exercise. This is one of the most important things you can do for your health. Most adults should: Exercise for at least 150 minutes each week. The exercise should increase your heart rate and make you sweat (moderate-intensity exercise). Do strengthening exercises at least twice a week. This is in addition to the moderate-intensity exercise. Spend less time sitting. Even light physical activity can be beneficial. Watch cholesterol and blood lipids Have your blood tested for lipids and cholesterol at 73 years of age, then have this test every 5 years. You may need to have your cholesterol levels checked more often if: Your lipid or cholesterol levels are high. You are older than 73 years of age. You are at high risk for heart disease. What should I know about cancer screening? Many types of cancers can be detected early and may often be prevented. Depending on your health history and family history, you may need to have cancer screening at various ages. This may include screening for: Colorectal cancer. Prostate cancer. Skin cancer. Lung  cancer. What should I know about heart disease, diabetes, and high blood pressure? Blood pressure and heart disease High blood pressure causes heart disease and increases the risk of stroke. This is more likely to develop in people who have high blood pressure readings, are of African descent, or are overweight. Talk with your health care provider about your target blood pressure readings. Have your blood pressure checked: Every 3-5 years if you are 18-39 years of age. Every year if you are 40 years old or older. If you are between the ages of 65 and 75 and are a current or former smoker, ask your health care provider if you should have a one-time screening for abdominal aortic aneurysm (AAA). Diabetes Have regular diabetes screenings. This checks your fasting blood sugar level. Have the screening done: Once every three years after age 45 if you are at a normal weight and have a low risk for diabetes. More often and at a younger age if you are overweight or have a high risk for diabetes. What should I know about preventing infection? Hepatitis B If you have a higher risk for hepatitis B, you should be screened for this virus. Talk with your health care provider to find out if you are at risk for hepatitis B infection. Hepatitis C Blood testing is recommended for: Everyone born from 1945 through 1965. Anyone with known risk factors for hepatitis C. Sexually transmitted infections (STIs) You should be screened each year for STIs, including gonorrhea and chlamydia, if: You are sexually active and are younger than 73 years of age. You are older than 73 years   of age and your health care provider tells you that you are at risk for this type of infection. Your sexual activity has changed since you were last screened, and you are at increased risk for chlamydia or gonorrhea. Ask your health care provider if you are at risk. Ask your health care provider about whether you are at high risk for HIV.  Your health care provider may recommend a prescription medicine to help prevent HIV infection. If you choose to take medicine to prevent HIV, you should first get tested for HIV. You should then be tested every 3 months for as long as you are taking the medicine. Follow these instructions at home: Lifestyle Do not use any products that contain nicotine or tobacco, such as cigarettes, e-cigarettes, and chewing tobacco. If you need help quitting, ask your health care provider. Do not use street drugs. Do not share needles. Ask your health care provider for help if you need support or information about quitting drugs. Alcohol use Do not drink alcohol if your health care provider tells you not to drink. If you drink alcohol: Limit how much you have to 0-2 drinks a day. Be aware of how much alcohol is in your drink. In the [place] one drink equals one 12 oz bottle of beer (355 mL), one 5 oz glass of wine (148 mL), or one 1 oz glass of hard liquor (44 mL). General instructions Schedule regular health, dental, and eye exams. Stay current with your vaccines. Tell your health care provider if: You often feel depressed. You have ever been abused or do not feel safe at home. Summary Adopting a healthy lifestyle and getting preventive care are important in promoting health and wellness. Follow your health care provider's instructions about healthy diet, exercising, and getting tested or screened for diseases. Follow your health care provider's instructions on monitoring your cholesterol and blood pressure. This information is not intended to replace advice given to you by your health care provider. Make sure you discuss any questions you have with your health care provider. Document Revised: 11/17/2020 Document Reviewed: 09/02/2018 Elsevier Patient Education  2022 Elsevier Inc.  

## 2021-06-10 NOTE — Progress Notes (Signed)
Subjective:   Gregory Zahi Plaskett. is a 75 y.o. male who presents for an Initial Medicare Annual Wellness Visit.  Review of Systems    I connected with  Gregory Mathews. on 06/12/21 by an audio only telemedicine application and verified that I am speaking with the correct Gregory Mathews using two identifiers.   I discussed the limitations, risks, security and privacy concerns of performing an evaluation and management service by telephone and the availability of in [facility] appointments. I also discussed with the patient that there may be a patient responsible charge related to this service. The patient expressed understanding and verbally consented to this telephonic visit.  Location of Patient: Home Location of Provider: Office  List any persons and their role that are participating in the visit with the patient.  Gregory Mathews and Gregory Mathews, CMA       Objective:    There were no vitals filed for this visit. There is no height or weight on file to calculate BMI.  Advanced Directives 01/21/2020 11/12/2017 08/14/2016 08/22/2014 02/25/2014  Does Patient Have a Medical Advance Directive? No No (No Data) Yes Patient does not have advance directive  Type of Advance Directive - - - Living will -  Copy of Gregory Mathews in Chart? - - - No - copy requested -  Would patient like information on creating a medical advance directive? Yes (ED - Information included in AVS) No - Patient declined - - -    Current Medications (verified) Outpatient Encounter Medications as of 06/10/2021  Medication Sig   blood glucose meter kit and supplies Use to test blood sugar daily. Dx: E11.9   clopidogrel (PLAVIX) 75 MG tablet TAKE 1 TABLET BY MOUTH EVERY DAY   Continuous Blood Gluc Sensor (FREESTYLE LIBRE 2 SENSOR) MISC Apply 1 sensor ever 14 days   ezetimibe (ZETIA) 10 MG tablet Take 1 tablet (10 mg total) by mouth daily.   glipiZIDE (GLUCOTROL) 5 MG tablet Take 1 tablet (5 mg total) by mouth 2  (two) times daily before a meal.   Lancets MISC Use to test blood sugar daily. Dx code :E11.9   losartan (COZAAR) 50 MG tablet Take 1 tablet (50 mg total) by mouth daily.   metFORMIN (GLUCOPHAGE) 500 MG tablet Take 1 tablet (500 mg total) by mouth 2 (two) times daily with a meal.   Multiple Vitamin (MULTIVITAMIN) tablet Take 1 tablet by mouth daily.   ONETOUCH ULTRA test strip USE TO TEST BLOOD SUGAR ONCE DAILY   empagliflozin (JARDIANCE) 10 MG TABS tablet Take 1 tablet (10 mg total) by mouth daily. (Patient not taking: Reported on 06/10/2021)   metoprolol succinate (TOPROL-XL) 25 MG 24 hr tablet Take 1 tablet (25 mg total) by mouth daily. (Patient not taking: Reported on 06/10/2021)   No facility-administered encounter medications on file as of 06/10/2021.    Allergies (verified) Januvia [sitagliptin], Lisinopril, and Statins   History: Past Medical History:  Diagnosis Date   Allergy    Basal cell carcinoma 01/2013   Nasal and R forearm.  [facility]   CAD S/P percutaneous coronary angioplasty    2/19 PCI/DESx1 to mLAD, FFR 0.7 -Sierra DES 2.75 mm x 18 mm   Cataract    Diabetes mellitus without complication (HCC)    on PO Meds   Essential hypertension    Family history of colon cancer    Family history of malignant neoplasm of breast    Heart murmur    Hyperlipidemia  statin intolerant -- myalgias, fatigue (tried at least 4)   Polyposis of colon    Rosacea    Past Surgical History:  Procedure Laterality Date   basal cell removal     nose and wrist    BELPHAROPTOSIS REPAIR     CORONARY CT ANGIOGRAM  09/2017   Coronary Calcium Score: 111. Coronary calcification noted in the LEFT MAIN (LM) and prox LAD.  LM < 50% calcified stenosis.  Mid LAD 50-75% plaque noted CT FFR --> positive at 0.80.  Recommend CARDIAC CATHETERIZATION   CORONARY STENT INTERVENTION N/A 11/12/2017   Procedure: CORONARY STENT INTERVENTION;  Surgeon: Gregory Man, MD;  Location: [facility];  Service: Cardiovascular:  DES PCI:  STENT SIERRA 2.75 X 18 MM - Post intervention, there is a 0% residual stenosis.   ETT/GXT: Graded Exercise Tolerance Test  08/2015    Exercised for 9:01 min --> blunted blood pressure response no EKG changes.  No chest pain.  Low Risk   EYE SURGERY     Cataract surgery   eyelid surgery     INTRAVASCULAR PRESSURE WIRE/FFR STUDY N/A 11/12/2017   Procedure: INTRAVASCULAR PRESSURE WIRE/FFR STUDY;  Surgeon: Gregory Man, MD;  Location: [facility];  Service: Cardiovascular;  Laterality: LAD -FFR 0.76   LEFT HEART CATH AND CORONARY ANGIOGRAPHY N/A 11/12/2017   Procedure: LEFT HEART CATH AND CORONARY ANGIOGRAPHY;  Surgeon: Gregory Man, MD;  Location: [facility]: 70% P-M LAD (FFR 0.76).  40% mid LAD, 50% ostial ramus.  EF 55-60%.  Mildly elevated LVEDP.   SHOULDER OPEN ROTATOR CUFF REPAIR  1983   TRANSTHORACIC ECHOCARDIOGRAM  08/2017   EF 55-60%.  Mild aortic stenosis (mean gradient 11 mmH.)  GR 1 DD.  Mild LA dilation.   Family History  Problem Relation Age of Onset   Leukemia Mother 66       d. 8   Stroke Father 78       as a complication of CABG   Colon cancer Father 74   Coronary artery disease Father 71       - Sx was DOE - MV CAD   Peripheral Artery Disease Father        presumptive   Diabetes Sister    Sudden Cardiac Death Sister 70       presumed MI   Heart attack Brother 81       During throat surgery   Congestive Heart Failure Brother        Died @ 5 - ? CHF / ICM   Breast cancer Maternal Grandmother        dx in her 59s   Stroke Maternal Grandfather    Diabetes Brother 45       on lots of meds   Diabetes Brother    Diabetes Paternal Grandmother    Rheumatic fever Brother        childhood RF --> Valvular Dz - valve Sgx,  died young   Stomach cancer Neg Hx    Esophageal cancer Neg Hx    Social History   Socioeconomic History   Marital status: Married    Spouse name: Gregory Mathews   Number of children: 4    Years of education: 20   Highest education level: Not on file  Occupational History   Occupation: bus Engineer, manufacturing systems: [employer]  Tobacco Use   Smoking status: Former    Packs/day: 1.00  Years: 15.00    Pack years: 15.00    Types: Cigarettes    Quit date: 07/09/1982    Years since quitting: 38.9   Smokeless tobacco: Never  Substance and Sexual Activity   Alcohol use: No   Drug use: No   Sexual activity: Yes    Partners: Female  Other Topics Concern   Not on file  Social History Narrative   Marital status: married x 48 years; happily married.   Lives: with wife       Children:  4 children; 4 grandchildren. Adult children all live locally.          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      Tobacco:  In past; smoked x 15 years; quit 35 years ago.      Alcohol: none      ADLs: independent with all ADLs.  Does not use assistant device with ambulation.      Living Will:  Has living will; desires FULL CODE; no prolonged measures.      Doctoral degree in Biblical Studies.          Diet: eats twice daily, eggs,sausage,and fruits for breakfast; chicken (fried or broil), occassional pizza, salads, steak and pork chops.        Exercise: cardiac rehab, transition to [facility] - 45 minutes walks and resistance 5x/week   Social Determinants of Health   Financial Resource Strain: Not on file  Food Insecurity: Not on file  Transportation Needs: Not on file  Physical Activity: Not on file  Stress: Not on file  Social Connections: Not on file    Tobacco Counseling Counseling given: Not Answered   Clinical Intake:  Pre-visit preparation completed: Yes  Pain : No/denies pain     Nutritional Risks: None Diabetes: Yes     Diabetic?Yes  Interpreter Needed?: No      Activities of Daily Living In your present state of health, do you have any difficulty performing the  following activities: 06/10/2021  Hearing? N  Vision? N  Difficulty concentrating or making decisions? N  Walking or climbing stairs? N  Dressing or bathing? N  Doing errands, shopping? N  Some recent data might be hidden    Patient Care Team: Gregory Pollen, MD as PCP - General (Internal Medicine) Gregory Man, MD as PCP - Cardiology (Cardiology)  Indicate any recent Medical Services you may have received from other than [facility] providers in the past year (date may be approximate).     Assessment:   This is a routine wellness examination for July.  Hearing/Vision screen No results found.  Dietary issues and exercise activities discussed:     Goals Addressed   None   Depression Screen PHQ 2/9 Scores 06/10/2021 02/15/2021 11/16/2020 07/24/2020 05/17/2020 01/21/2020 12/13/2019  PHQ - 2 Score 0 0 0 0 0 0 0  PHQ- 9 Score 0 - - - - - -    Fall Risk Fall Risk  06/10/2021 02/15/2021 11/16/2020 07/24/2020 01/21/2020  Falls in the past year? 0 0 0 0 0  Number falls in past yr: 0 0 - 0 0  Injury with Fall? 0 0 - 0 0  Risk for fall due to : No Fall Risks - - - -  Follow up Falls evaluation completed - Falls evaluation completed Falls evaluation completed Falls evaluation completed;Education provided    FALL RISK PREVENTION PERTAINING TO THE HOME:  Any stairs in or  around the home? No  If so, are there any without handrails? No  Home free of loose throw rugs in walkways, pet beds, electrical cords, etc? yes Adequate lighting in your home to reduce risk of falls? Yes   ASSISTIVE DEVICES UTILIZED TO PREVENT FALLS:  Life alert? No  Use of a cane, walker or w/c? No  Grab bars in the bathroom? No  Shower chair or bench in shower? No  Elevated toilet seat or a handicapped toilet? Yes   TIMED UP AND GO:  Was the test performed? No .  Length of time to ambulate 10 feet: n/a sec.     Cognitive Function:     6CIT Screen 06/10/2021 01/21/2020  What Year? 0 points 0 points   What month? 0 points 0 points  What time? 0 points 0 points  Count back from 20 0 points 0 points  Months in reverse 0 points 0 points  Repeat phrase 2 points 0 points  Total Score 2 0    Immunizations Immunization History  Administered Date(s) Administered   Fluad Quad(high Dose 65+) 07/08/2019, 07/13/2020, 05/24/2021   Influenza Split 07/09/2012   Influenza, High Dose Seasonal PF 06/12/2018   Influenza,inj,Quad PF,6+ Mos 07/20/2013, 07/19/2014, 08/01/2015, 08/14/2016, 09/10/2017   Influenza,trivalent, recombinat, inj, PF 06/13/2011   Pneumococcal Conjugate-13 08/22/2014   Pneumococcal Polysaccharide-23 07/09/2012, 03/11/2018   Tdap 06/13/2011, 08/01/2015   Zoster Recombinat (Shingrix) 05/24/2021   Zoster, Live 09/23/2005, 02/10/2012    TDAP status: Up to date  Flu Vaccine status: Up to date  Pneumococcal vaccine status: Due, Education has been provided regarding the importance of this vaccine. Advised may receive this vaccine at local pharmacy or Health Dept. Aware to provide a copy of the vaccination record if obtained from local pharmacy or Health Dept. Verbalized acceptance and understanding.  Covid-19 vaccine status: Information provided on how to obtain vaccines.   Qualifies for Shingles Vaccine? Yes   Zostavax completed No   Shingrix Completed?: Yes  Screening Tests Health Maintenance  Topic Date Due   COVID-19 Vaccine (1) 06/26/2021 (Originally 07/15/1946)   FOOT EXAM  02/15/2022 (Originally 11/17/2020)   Zoster Vaccines- Shingrix (2 of 2) 07/19/2021   COLONOSCOPY (Pts 45-20yr Insurance coverage will need to be confirmed)  11/13/2021   HEMOGLOBIN A1C  11/21/2021   OPHTHALMOLOGY EXAM  12/13/2021   TETANUS/TDAP  07/31/2025   INFLUENZA VACCINE  Completed   Hepatitis C Screening  Completed   HPV VACCINES  Aged Out    Health Maintenance  There are no preventive care reminders to display for this patient.   Colorectal cancer screening: Type of screening:  Colonoscopy. Completed 2/21/202. Repeat every 1 years  Lung Cancer Screening: (Low Dose CT Chest recommended if Age 73-80years, 30 pack-year currently smoking OR have quit w/in 15years.) does qualify.   Lung Cancer Screening Referral: n/a  Additional Screening:  Hepatitis C Screening: does qualify; Completed 03/13/2016  Vision Screening: Recommended annual ophthalmology exams for early detection of glaucoma and other disorders of the eye. Is the patient up to date with their annual eye exam?  Yes  Who is the provider or what is the name of the office in which the patient attends annual eye exams? [facility] If pt is not established with a provider, would they like to be referred to a provider to establish care? No .   Dental Screening: Recommended annual dental exams for proper oral hygiene  Community Resource Referral / Chronic Care Management: CRR required this visit?  No   CCM required this visit?  No      Plan:     I have personally reviewed and noted the following in the patient's chart:   Medical and social history Use of alcohol, tobacco or illicit drugs  Current medications and supplements including opioid prescriptions. Patient is not currently taking opioid prescriptions. Functional ability and status Nutritional status Physical activity Advanced directives List of other physicians Hospitalizations, surgeries, and ER visits in previous 12 months Vitals Screenings to include cognitive, depression, and falls Referrals and appointments  In addition, I have reviewed and discussed with patient certain preventive protocols, quality metrics, and best practice recommendations. A written personalized care plan for preventive services as well as general preventive health recommendations were provided to patient.     Gregory Mathews, CMA   06/12/2021   Nurse Notes: Non Face to Face 40 min visit   Mr. Senn , Thank you for taking time to come for your  Medicare Wellness Visit. I appreciate your ongoing commitment to your health goals. Please review the following plan we discussed and let me know if I can assist you in the future.   These are the goals we discussed:  Goals      Patient Stated     Patient would like to get his medication down.  Glipizide  Keep his cholesterol were it is.          This is a list of the screening recommended for you and due dates:  Health Maintenance  Topic Date Due   COVID-19 Vaccine (1) 06/26/2021*   Complete foot exam   02/15/2022*   Zoster (Shingles) Vaccine (2 of 2) 07/19/2021   Colon Cancer Screening  11/13/2021   Hemoglobin A1C  11/21/2021   Eye exam for diabetics  12/13/2021   Tetanus Vaccine  07/31/2025   Flu Shot  Completed   Hepatitis C Screening: USPSTF Recommendation to screen - Ages 18-79 yo.  Completed   HPV Vaccine  Aged Out  *Topic was postponed. The date shown is not the original due date.

## 2021-07-23 ENCOUNTER — Telehealth: Payer: Self-pay | Admitting: Emergency Medicine

## 2021-07-23 NOTE — Telephone Encounter (Signed)
Called and left vm  °

## 2021-07-23 NOTE — Telephone Encounter (Signed)
Pt. Called on 10.29.2022 and states she has a cough with sinus pain that started 2-3 days ago and is getting worse, rates pain 5/10, has taken Tylenol without relief, headache, runny nose, denies other symptoms, no known exp to covid in last 14 days, drinking fluids and voiding.    Pt. Was advised to see pcp within 24 hours. Attempted to contact pt. To schedule appt. Please assist pt. If he returns call

## 2021-07-25 ENCOUNTER — Other Ambulatory Visit: Payer: Self-pay

## 2021-07-25 ENCOUNTER — Telehealth (INDEPENDENT_AMBULATORY_CARE_PROVIDER_SITE_OTHER): Payer: Medicare Other | Admitting: Internal Medicine

## 2021-07-25 ENCOUNTER — Encounter: Payer: Self-pay | Admitting: Internal Medicine

## 2021-07-25 DIAGNOSIS — J019 Acute sinusitis, unspecified: Secondary | ICD-10-CM | POA: Diagnosis not present

## 2021-07-25 MED ORDER — HYDROCOD POLST-CPM POLST ER 10-8 MG/5ML PO SUER: 5.0000 mL | Freq: Two times a day (BID) | ORAL | 0 refills | Status: DC | PRN

## 2021-07-25 MED ORDER — AMOXICILLIN-POT CLAVULANATE 875-125 MG PO TABS: 1.0000 | ORAL_TABLET | Freq: Two times a day (BID) | ORAL | 0 refills | Status: DC

## 2021-07-25 NOTE — Progress Notes (Signed)
Virtual Visit via Video Note  I connected with Gregory Mathews. on 07/25/21 at  3:20 PM EDT by a video enabled telemedicine application and verified that I am speaking with the correct person using two identifiers.   I discussed the limitations of evaluation and management by telemedicine and the availability of in person appointments. The patient expressed understanding and agreed to proceed.  Present for the visit:  Myself, Dr Billey Gosling, [facility].  The patient is currently at home and I am in the office.    No referring provider.    History of Present Illness: He is here for an acute visit for cold symptoms.  His symptoms started about one week ago.   He is experiencing nasal congestion, sinus pressure, sore throat and cough.  He has had some chills.  The cough is getting worse-it has become more productive.  It hurts in his chest when he tries to get the sputum up.  He also states headaches, mild body aches.  He denies any shortness of breath or wheezing.  He has tried taking tylenol  He has a touch of asthma.  He denies sick contacts.  He has a history of sinus infections, but he has not had one in a while.    Review of Systems  Constitutional:  Positive for chills. Negative for fever.  HENT:  Positive for congestion, sinus pain and sore throat.   Respiratory:  Positive for cough and sputum production. Negative for shortness of breath and wheezing.   Gastrointestinal:  Negative for diarrhea and nausea.  Musculoskeletal:  Positive for myalgias (mild).  Neurological:  Positive for headaches. Negative for dizziness.    Social History   Socioeconomic History   Marital status: Married    Spouse name: [name]   Number of children: 4   Years of education: 20   Highest education level: Not on file  Occupational History   Occupation: bus Engineer, manufacturing systems: [employer]  Tobacco Use   Smoking status: Former    Packs/day: 1.00    Years: 15.00     Pack years: 15.00    Types: Cigarettes    Quit date: 07/09/1982    Years since quitting: 39.0   Smokeless tobacco: Never  Substance and Sexual Activity   Alcohol use: No   Drug use: No   Sexual activity: Yes    Partners: Female  Other Topics Concern   Not on file  Social History Narrative   Marital status: married x 70 years; happily married.   Lives: with wife       Children:  4 children; 4 grandchildren. Adult children all live locally.          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      Tobacco:  In past; smoked x 15 years; quit 35 years ago.      Alcohol: none      ADLs: independent with all ADLs.  Does not use assistant device with ambulation.      Living Will:  Has living will; desires FULL CODE; no prolonged measures.      Doctoral degree in Biblical Studies.          Diet: eats twice daily, eggs,sausage,and fruits for breakfast; chicken (fried or broil), occassional pizza, salads, steak and pork chops.        Exercise: cardiac rehab, transition to [facility] - 45 minutes walks and resistance 5x/week  Social Determinants of Health   Financial Resource Strain: Not on file  Food Insecurity: Not on file  Transportation Needs: Not on file  Physical Activity: Not on file  Stress: Not on file  Social Connections: Not on file     Observations/Objective: Appears well in NAD Breathing normally Skin appears warm and dry  Assessment and Plan:  Acute sinus infection: Acute This matches his history of sinus infections and he feels he is low risk for other causes Likely bacterial  Start Augmentin 875-125 mg BID x 10 day Tussionex cough syrup 5 mL every 12 hours as needed otc cold medications Rest, fluid Call if no improvement    Follow Up Instructions:    I discussed the assessment and treatment plan with the patient. The patient was provided an opportunity to ask questions and all were  answered. The patient agreed with the plan and demonstrated an understanding of the instructions.   The patient was advised to call back or seek an in-person evaluation if the symptoms worsen or if the condition fails to improve as anticipated.    Binnie Rail, MD

## 2021-07-30 ENCOUNTER — Other Ambulatory Visit: Payer: Self-pay | Admitting: Emergency Medicine

## 2021-07-30 DIAGNOSIS — Z955 Presence of coronary angioplasty implant and graft: Secondary | ICD-10-CM

## 2021-08-13 ENCOUNTER — Encounter: Payer: Self-pay | Admitting: Emergency Medicine

## 2021-08-13 ENCOUNTER — Ambulatory Visit (INDEPENDENT_AMBULATORY_CARE_PROVIDER_SITE_OTHER): Payer: Medicare Other | Admitting: Emergency Medicine

## 2021-08-13 ENCOUNTER — Other Ambulatory Visit: Payer: Self-pay

## 2021-08-13 VITALS — BP 130/70 | HR 60 | Temp 98.0°F | Ht 70.0 in | Wt 213.0 lb

## 2021-08-13 DIAGNOSIS — E1169 Type 2 diabetes mellitus with other specified complication: Secondary | ICD-10-CM

## 2021-08-13 DIAGNOSIS — Z1329 Encounter for screening for other suspected endocrine disorder: Secondary | ICD-10-CM | POA: Diagnosis not present

## 2021-08-13 DIAGNOSIS — Z130 Encounter for screening for diseases of the blood and blood-forming organs and certain disorders involving the immune mechanism: Secondary | ICD-10-CM | POA: Diagnosis not present

## 2021-08-13 DIAGNOSIS — Z13220 Encounter for screening for lipoid disorders: Secondary | ICD-10-CM

## 2021-08-13 DIAGNOSIS — Z125 Encounter for screening for malignant neoplasm of prostate: Secondary | ICD-10-CM

## 2021-08-13 DIAGNOSIS — T466X5A Adverse effect of antihyperlipidemic and antiarteriosclerotic drugs, initial encounter: Secondary | ICD-10-CM

## 2021-08-13 DIAGNOSIS — Z23 Encounter for immunization: Secondary | ICD-10-CM

## 2021-08-13 DIAGNOSIS — I152 Hypertension secondary to endocrine disorders: Secondary | ICD-10-CM | POA: Diagnosis not present

## 2021-08-13 DIAGNOSIS — E785 Hyperlipidemia, unspecified: Secondary | ICD-10-CM

## 2021-08-13 DIAGNOSIS — G720 Drug-induced myopathy: Secondary | ICD-10-CM | POA: Diagnosis not present

## 2021-08-13 DIAGNOSIS — Z0000 Encounter for general adult medical examination without abnormal findings: Secondary | ICD-10-CM | POA: Diagnosis not present

## 2021-08-13 DIAGNOSIS — E1159 Type 2 diabetes mellitus with other circulatory complications: Secondary | ICD-10-CM

## 2021-08-13 DIAGNOSIS — Z13228 Encounter for screening for other metabolic disorders: Secondary | ICD-10-CM

## 2021-08-13 LAB — LIPID PANEL
Cholesterol: 175 mg/dL (ref 0–200)
HDL: 40.2 mg/dL (ref >39.00)
LDL Cholesterol: 106 mg/dL — ABNORMAL HIGH (ref 0–99)
NonHDL: 135.12
Total CHOL/HDL Ratio: 4
Triglycerides: 148 mg/dL (ref 0.0–149.0)
VLDL: 29.6 mg/dL (ref 0.0–40.0)

## 2021-08-13 LAB — CBC WITH DIFFERENTIAL/PLATELET
Basophils Absolute: 0.1 10*3/uL (ref 0.0–0.1)
Basophils Relative: 1.1 % (ref 0.0–3.0)
Eosinophils Absolute: 0.3 10*3/uL (ref 0.0–0.7)
Eosinophils Relative: 3.4 % (ref 0.0–5.0)
HCT: 39 % (ref 39.0–52.0)
Hemoglobin: 13.3 g/dL (ref 13.0–17.0)
Lymphocytes Relative: 27.9 % (ref 12.0–46.0)
Lymphs Abs: 2.1 10*3/uL (ref 0.7–4.0)
MCHC: 34.1 g/dL (ref 30.0–36.0)
MCV: 91.2 fl (ref 78.0–100.0)
Monocytes Absolute: 0.4 10*3/uL (ref 0.1–1.0)
Monocytes Relative: 5.8 % (ref 3.0–12.0)
Neutro Abs: 4.7 10*3/uL (ref 1.4–7.7)
Neutrophils Relative %: 61.8 % (ref 43.0–77.0)
Platelets: 205 10*3/uL (ref 150.0–400.0)
RBC: 4.28 Mil/uL (ref 4.22–5.81)
RDW: 13.5 % (ref 11.5–15.5)
WBC: 7.7 10*3/uL (ref 4.0–10.5)

## 2021-08-13 LAB — COMPREHENSIVE METABOLIC PANEL
ALT: 29 U/L (ref 0–53)
AST: 26 U/L (ref 0–37)
Albumin: 4.7 g/dL (ref 3.5–5.2)
Alkaline Phosphatase: 54 U/L (ref 39–117)
BUN: 21 mg/dL (ref 6–23)
CO2: 28 mEq/L (ref 19–32)
Calcium: 10.3 mg/dL (ref 8.4–10.5)
Chloride: 102 mEq/L (ref 96–112)
Creatinine, Ser: 0.92 mg/dL (ref 0.40–1.50)
GFR: 82.48 mL/min (ref >60.00)
Glucose, Bld: 111 mg/dL — ABNORMAL HIGH (ref 70–99)
Potassium: 4.3 mEq/L (ref 3.5–5.1)
Sodium: 138 mEq/L (ref 135–145)
Total Bilirubin: 0.7 mg/dL (ref 0.2–1.2)
Total Protein: 7.4 g/dL (ref 6.0–8.3)

## 2021-08-13 LAB — HEMOGLOBIN A1C: Hgb A1c MFr Bld: 7 % — ABNORMAL HIGH (ref 4.6–6.5)

## 2021-08-13 LAB — PSA: PSA: 0.65 ng/mL (ref 0.10–4.00)

## 2021-08-13 NOTE — Patient Instructions (Signed)

## 2021-08-13 NOTE — Progress Notes (Signed)
Lab Results  Component Value Date   HGBA1C 6.9 (A) 05/24/2021   Gregory Mathews. 75 y.o.   Chief Complaint  Patient presents with   Annual Exam    HISTORY OF PRESENT ILLNESS: This is a 75 y.o. male here for annual exam. Doing well.  Has no complaints or medical concerns today. History of diabetes.  Blood glucose in the morning between 80 and 100. History of hypertension.  Doing well. History of dyslipidemia with statin intolerance, on Zetia. History of coronary artery disease.  No anginal episodes. Most recent stress test earlier this year was normal.  Reviewed with patient.  HPI   Prior to Admission medications   Medication Sig Start Date End Date Taking? Authorizing Provider  blood glucose meter kit and supplies Use to test blood sugar daily. Dx: E11.9 10/11/15  Yes Tereasa Coop, PA-C  chlorpheniramine-HYDROcodone (TUSSIONEX PENNKINETIC ER) 10-8 MG/5ML SUER Take 5 mLs by mouth every 12 (twelve) hours as needed for cough. 07/25/21  Yes Burns, Claudina Lick, MD  clopidogrel (PLAVIX) 75 MG tablet TAKE 1 TABLET BY MOUTH EVERY DAY 07/30/21  Yes Sunday Klos, Ines Bloomer, MD  ezetimibe (ZETIA) 10 MG tablet Take 1 tablet (10 mg total) by mouth daily. 02/15/21  Yes Horald Pollen, MD  Lancets MISC Use to test blood sugar daily. Dx code :E11.9 07/08/19  Yes Horald Pollen, MD  metFORMIN (GLUCOPHAGE) 500 MG tablet Take 1 tablet (500 mg total) by mouth 2 (two) times daily with a meal. 02/15/21  Yes Arianie Couse, Ines Bloomer, MD  metoprolol succinate (TOPROL-XL) 25 MG 24 hr tablet Take 1 tablet (25 mg total) by mouth daily. 11/16/20  Yes Abriella Filkins, Ines Bloomer, MD  Multiple Vitamin (MULTIVITAMIN) tablet Take 1 tablet by mouth daily.   Yes [provider]  [facility] ULTRA test strip USE TO TEST BLOOD SUGAR ONCE DAILY 09/14/20  Yes Leotta Weingarten, Ines Bloomer, MD  amoxicillin-clavulanate (AUGMENTIN) 875-125 MG tablet Take 1 tablet by mouth 2 (two) times daily. Patient not taking: Reported  on 08/13/2021 07/25/21   Binnie Rail, MD  Continuous Blood Gluc Sensor (FREESTYLE LIBRE 2 SENSOR) MISC Apply 1 sensor ever 14 days Patient not taking: Reported on 08/13/2021 02/15/21   Horald Pollen, MD  empagliflozin (JARDIANCE) 10 MG TABS tablet Take 1 tablet (10 mg total) by mouth daily. Patient not taking: Reported on 08/13/2021 11/16/20   Horald Pollen, MD  glipiZIDE (GLUCOTROL) 5 MG tablet Take 1 tablet (5 mg total) by mouth 2 (two) times daily before a meal. 02/15/21 06/10/21  Oakley Orban, Ines Bloomer, MD  losartan (COZAAR) 50 MG tablet Take 1 tablet (50 mg total) by mouth daily. 02/15/21 06/10/21  Horald Pollen, MD    Allergies  Allergen Reactions   Januvia [Sitagliptin] Other (See Comments)    Gi upset and kidney problems   Lisinopril Cough   Statins Other (See Comments)    Severe leg pain & fatigue -- tried at least 4 (even low dose)    Patient Active Problem List   Diagnosis Date Noted   Atherosclerosis of aorta ([place]) 02/15/2021   Genetic testing 01/01/2021   Family history of colon cancer    Family history of malignant neoplasm of breast    Polyposis of colon    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   Clotting disorder ([place]) 05/17/2020   Skin cancer, basal cell 11/18/2019   Uncircumcised male 03/11/2018   Coronary artery disease involving native coronary artery of native heart with angina pectoris ([place]) 11/04/2017  Abnormal findings on diagnostic imaging of cardiovascular system 11/04/2017   Agatston coronary artery calcium score between 100 and 199 09/22/2017   Family history of early CAD 09/11/2017   Mild aortic stenosis by prior echocardiogram 16/06/9603   Metabolic syndrome 54/05/8118   Hypogonadism in male 08/14/2016   BMI 31.0-31.9,adult 03/13/2016   Hypertension associated with diabetes ([place]) 08/29/2015   Diverticulosis of colon without hemorrhage 08/01/2015   Statin intolerance 08/01/2015   Basal cell carcinoma of nose 08/23/2014   Rosacea  08/23/2014   Seasonal allergies 01/26/2013   Type 2 diabetes mellitus with hyperglycemia, without long-term current use of insulin ([place]) 07/09/2012   Hyperlipidemia associated with type 2 diabetes mellitus ([place]) 07/09/2012   Erectile dysfunction 07/09/2012    Past Medical History:  Diagnosis Date   Allergy    Basal cell carcinoma 01/2013   Nasal and R forearm.  [facility]   CAD S/P percutaneous coronary angioplasty    2/19 PCI/DESx1 to mLAD, FFR 0.7 -[place] DES 2.75 mm x 18 mm   Cataract    Diabetes mellitus without complication (HCC)    on PO Meds   Essential hypertension    Family history of colon cancer    Family history of malignant neoplasm of breast    Heart murmur    Hyperlipidemia    statin intolerant -- myalgias, fatigue (tried at least 4)   Polyposis of colon    Rosacea     Past Surgical History:  Procedure Laterality Date   basal cell removal     nose and wrist    BELPHAROPTOSIS REPAIR     CORONARY CT ANGIOGRAM  09/2017   Coronary Calcium Score: 111. Coronary calcification noted in the LEFT MAIN (LM) and prox LAD.  LM < 50% calcified stenosis.  Mid LAD 50-75% plaque noted CT FFR --> positive at 0.80.  Recommend CARDIAC CATHETERIZATION   CORONARY STENT INTERVENTION N/A 11/12/2017   Procedure: CORONARY STENT INTERVENTION;  Surgeon: Leonie Man, MD;  Location: [place];  Service: Cardiovascular:  DES PCI:  STENT SIERRA 2.75 X 18 MM - Post intervention, there is a 0% residual stenosis.   ETT/GXT: Graded Exercise Tolerance Test  08/2015    Exercised for 9:01 min --> blunted blood pressure response no EKG changes.  No chest pain.  Low Risk   EYE SURGERY     Cataract surgery   eyelid surgery     INTRAVASCULAR PRESSURE WIRE/FFR STUDY N/A 11/12/2017   Procedure: INTRAVASCULAR PRESSURE WIRE/FFR STUDY;  Surgeon: Leonie Man, MD;  Location: [facility];  Service: Cardiovascular;  Laterality: LAD -FFR 0.76   LEFT HEART CATH AND CORONARY  ANGIOGRAPHY N/A 11/12/2017   Procedure: LEFT HEART CATH AND CORONARY ANGIOGRAPHY;  Surgeon: Leonie Man, MD;  Location: [facility]: 70% P-M LAD (FFR 0.76).  40% mid LAD, 50% ostial ramus.  EF 55-60%.  Mildly elevated LVEDP.   SHOULDER OPEN ROTATOR CUFF REPAIR  1983   TRANSTHORACIC ECHOCARDIOGRAM  08/2017   EF 55-60%.  Mild aortic stenosis (mean gradient 11 mmH.)  GR 1 DD.  Mild LA dilation.    Social History   Socioeconomic History   Marital status: Married    Spouse name: [name]   Number of children: 4   Years of education: 20   Highest education level: Not on file  Occupational History   Occupation: bus Engineer, manufacturing systems: [employer]  Tobacco Use   Smoking status: Former  Packs/day: 1.00    Years: 15.00    Pack years: 15.00    Types: Cigarettes    Quit date: 07/09/1982    Years since quitting: 39.1   Smokeless tobacco: Never  Substance and Sexual Activity   Alcohol use: No   Drug use: No   Sexual activity: Yes    Partners: Female  Other Topics Concern   Not on file  Social History Narrative   Marital status: married x 81 years; happily married.   Lives: with wife       Children:  4 children; 4 grandchildren. Adult children all live locally.          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      Tobacco:  In past; smoked x 15 years; quit 35 years ago.      Alcohol: none      ADLs: independent with all ADLs.  Does not use assistant device with ambulation.      Living Will:  Has living will; desires FULL CODE; no prolonged measures.      Doctoral degree in Biblical Studies.          Diet: eats twice daily, eggs,sausage,and fruits for breakfast; chicken (fried or broil), occassional pizza, salads, steak and pork chops.        Exercise: cardiac rehab, transition to [facility] - 45 minutes walks and resistance 5x/week   Social Determinants of Health   Financial  Resource Strain: Not on file  Food Insecurity: Not on file  Transportation Needs: Not on file  Physical Activity: Not on file  Stress: Not on file  Social Connections: Not on file  Intimate Partner Violence: Not on file    Family History  Problem Relation Age of Onset   Leukemia Mother 26       d. 40   Stroke Father 75       as a complication of CABG   Colon cancer Father 32   Coronary artery disease Father 40       - Sx was DOE - MV CAD   Peripheral Artery Disease Father        presumptive   Diabetes Sister    Sudden Cardiac Death Sister 64       presumed MI   Heart attack Brother 81       During throat surgery   Congestive Heart Failure Brother        Died @ 55 - ? CHF / ICM   Breast cancer Maternal Grandmother        dx in her 107s   Stroke Maternal Grandfather    Diabetes Brother 1       on lots of meds   Diabetes Brother    Diabetes Paternal Grandmother    Rheumatic fever Brother        childhood RF --> Valvular Dz - valve Sgx,  died young   Stomach cancer Neg Hx    Esophageal cancer Neg Hx      Review of Systems  Constitutional: Negative.  Negative for chills and fever.  HENT: Negative.  Negative for congestion and sore throat.   Respiratory: Negative.  Negative for cough and shortness of breath.   Cardiovascular: Negative.  Negative for chest pain and palpitations.  Gastrointestinal: Negative.  Negative for abdominal pain, blood in stool, diarrhea, melena, nausea and vomiting.  Genitourinary: Negative.  Negative for dysuria and hematuria.  Skin: Negative.  Negative for  rash.  Neurological:  Positive for dizziness (Positional and getting better.  May be related to recent viral illness). Negative for headaches.  All other systems reviewed and are negative.   Physical Exam HENT:     Head: Normocephalic.     Right Ear: Tympanic membrane, ear canal and external ear normal.     Left Ear: Tympanic membrane, ear canal and external ear normal.  Cardiovascular:      Rate and Rhythm: Normal rate and regular rhythm.     Heart sounds: Murmur (Systolic 3 out of 6 best heard at mitral area) heard.  Pulmonary:     Effort: Pulmonary effort is normal.     Breath sounds: Normal breath sounds.  Abdominal:     General: There is no distension.     Palpations: Abdomen is soft.     Tenderness: There is no abdominal tenderness.  Musculoskeletal:        General: Normal range of motion.     Cervical back: Normal range of motion and neck supple. No tenderness.  Lymphadenopathy:     Cervical: No cervical adenopathy.  Skin:    General: Skin is warm and dry.     Capillary Refill: Capillary refill takes less than 2 seconds.  Neurological:     General: No focal deficit present.     Mental Status: He is oriented to person, place, and time.  Psychiatric:        Mood and Affect: Mood normal.        Behavior: Behavior normal.     ASSESSMENT & PLAN: Problem List Items Addressed This Visit       Cardiovascular and Mediastinum   Hypertension associated with diabetes ([place]) (Chronic)   Relevant Orders   Comprehensive metabolic panel   Hemoglobin A1c     Endocrine   Hyperlipidemia associated with type 2 diabetes mellitus (HCC) (Chronic)   Relevant Orders   Lipid panel     Musculoskeletal and Integument   Statin myopathy (Chronic)   Other Visit Diagnoses     Routine general medical examination at a health care facility    -  Primary   Need for shingles vaccine       Relevant Orders   Varicella-zoster vaccine IM (Shingrix) (Completed)   Prostate cancer screening       Relevant Orders   PSA(Must document that pt has been informed of limitations of PSA testing.)   Screening for deficiency anemia       Relevant Orders   CBC with Differential   Screening for lipoid disorders       Screening for endocrine, metabolic and immunity disorder            Modifiable risk factors discussed with patient. Anticipatory guidance according to age provided. The  following topics were also discussed: Social Determinants of Health Smoking.  Non-smoker Diet and nutrition Diabetes management Benefits of exercise.  Exercises regularly at the [facility]. Cancer screening and review of most recent colonoscopy PSA done today.  Minimal urinary symptoms. Vaccinations recommendations.  Getting second Shingrix vaccine today Cardiovascular chronic conditions discussed Hypertension management Dyslipidemia management Mental health including depression and anxiety Fall and accident prevention  Patient Instructions  Health Maintenance, Male Adopting a healthy lifestyle and getting preventive care are important in promoting health and wellness. Ask your health care provider about: The right schedule for you to have regular tests and exams. Things you can do on your own to prevent diseases and keep yourself healthy. What should I  know about diet, weight, and exercise? Eat a healthy diet  Eat a diet that includes plenty of vegetables, fruits, low-fat dairy products, and lean protein. Do not eat a lot of foods that are high in solid fats, added sugars, or sodium. Maintain a healthy weight Body mass index (BMI) is a measurement that can be used to identify possible weight problems. It estimates body fat based on height and weight. Your health care provider can help determine your BMI and help you achieve or maintain a healthy weight. Get regular exercise Get regular exercise. This is one of the most important things you can do for your health. Most adults should: Exercise for at least 150 minutes each week. The exercise should increase your heart rate and make you sweat (moderate-intensity exercise). Do strengthening exercises at least twice a week. This is in addition to the moderate-intensity exercise. Spend less time sitting. Even light physical activity can be beneficial. Watch cholesterol and blood lipids Have your blood tested for lipids and cholesterol at 73  years of age, then have this test every 5 years. You may need to have your cholesterol levels checked more often if: Your lipid or cholesterol levels are high. You are older than 73 years of age. You are at high risk for heart disease. What should I know about cancer screening? Many types of cancers can be detected early and may often be prevented. Depending on your health history and family history, you may need to have cancer screening at various ages. This may include screening for: Colorectal cancer. Prostate cancer. Skin cancer. Lung cancer. What should I know about heart disease, diabetes, and high blood pressure? Blood pressure and heart disease High blood pressure causes heart disease and increases the risk of stroke. This is more likely to develop in people who have high blood pressure readings or are overweight. Talk with your health care provider about your target blood pressure readings. Have your blood pressure checked: Every 3-5 years if you are 48-62 years of age. Every year if you are 71 years old or older. If you are between the ages of 82 and 29 and are a current or former smoker, ask your health care provider if you should have a one-time screening for abdominal aortic aneurysm (AAA). Diabetes Have regular diabetes screenings. This checks your fasting blood sugar level. Have the screening done: Once every three years after age 73 if you are at a normal weight and have a low risk for diabetes. More often and at a younger age if you are overweight or have a high risk for diabetes. What should I know about preventing infection? Hepatitis B If you have a higher risk for hepatitis B, you should be screened for this virus. Talk with your health care provider to find out if you are at risk for hepatitis B infection. Hepatitis C Blood testing is recommended for: Everyone born from [birth year] through 1963. Anyone with known risk factors for hepatitis C. Sexually transmitted  infections (STIs) You should be screened each year for STIs, including gonorrhea and chlamydia, if: You are sexually active and are younger than 73 years of age. You are older than 73 years of age and your health care provider tells you that you are at risk for this type of infection. Your sexual activity has changed since you were last screened, and you are at increased risk for chlamydia or gonorrhea. Ask your health care provider if you are at risk. Ask your health care provider about  whether you are at high risk for HIV. Your health care provider may recommend a prescription medicine to help prevent HIV infection. If you choose to take medicine to prevent HIV, you should first get tested for HIV. You should then be tested every 3 months for as long as you are taking the medicine. Follow these instructions at home: Alcohol use Do not drink alcohol if your health care provider tells you not to drink. If you drink alcohol: Limit how much you have to 0-2 drinks a day. Know how much alcohol is in your drink. In the [place] one drink equals one 12 oz bottle of beer (355 mL), one 5 oz glass of wine (148 mL), or one 1 oz glass of hard liquor (44 mL). Lifestyle Do not use any products that contain nicotine or tobacco. These products include cigarettes, chewing tobacco, and vaping devices, such as e-cigarettes. If you need help quitting, ask your health care provider. Do not use street drugs. Do not share needles. Ask your health care provider for help if you need support or information about quitting drugs. General instructions Schedule regular health, dental, and eye exams. Stay current with your vaccines. Tell your health care provider if: You often feel depressed. You have ever been abused or do not feel safe at home. Summary Adopting a healthy lifestyle and getting preventive care are important in promoting health and wellness. Follow your health care provider's instructions about healthy  diet, exercising, and getting tested or screened for diseases. Follow your health care provider's instructions on monitoring your cholesterol and blood pressure. This information is not intended to replace advice given to you by your health care provider. Make sure you discuss any questions you have with your health care provider. Document Revised: 01/29/2021 Document Reviewed: 01/29/2021 Elsevier Patient Education  2022 [place], MD [facility]

## 2021-09-03 ENCOUNTER — Ambulatory Visit (INDEPENDENT_AMBULATORY_CARE_PROVIDER_SITE_OTHER): Payer: Medicare Other | Admitting: Physician Assistant

## 2021-09-03 ENCOUNTER — Encounter: Payer: Self-pay | Admitting: Physician Assistant

## 2021-09-03 VITALS — BP 130/74 | HR 68 | Ht 70.0 in | Wt 216.0 lb

## 2021-09-03 DIAGNOSIS — Z7901 Long term (current) use of anticoagulants: Secondary | ICD-10-CM

## 2021-09-03 DIAGNOSIS — Z800 Family history of malignant neoplasm of digestive organs: Secondary | ICD-10-CM

## 2021-09-03 DIAGNOSIS — Z86010 Personal history of colonic polyps: Secondary | ICD-10-CM | POA: Diagnosis not present

## 2021-09-03 MED ORDER — NA SULFATE-K SULFATE-MG SULF 17.5-3.13-1.6 GM/177ML PO SOLN: 1.0000 | Freq: Once | ORAL | 0 refills | Status: AC

## 2021-09-03 NOTE — Progress Notes (Signed)
Chief Complaint: Discuss colonoscopy  HPI:    Gregory Mathews is a 75 year old male with a past medical history of CAD on Plavix, diabetes, family history of colon cancer and multiple others, who returns to clinic today to discuss colonoscopy.    11/13/2020 colonoscopy with nonbleeding internal hemorrhoids, diverticulosis in the sigmoid colon, one 4 mm polyp in the rectum, 3 1-2 mm polyps in the rectum, 1 3 mm polyp in the sigmoid colon, 1 3 mm polyp at the splenic flexure, 1 3 mm polyp in the transverse colon, 5 1-2 mm polyps in the ascending colon otherwise normal.  Pathology showed tubular adenomas and patient was referred to genetic counseling given the number of polyps removed and his family history.  Repeat was recommended in 1 year.    Today, the patient presents to clinic and tells me that he wants to go ahead and schedule his next colonoscopy.  He has very specific dates that his wife is available since she is a Pharmacist, hospital.  Tells me in general he has been doing well.  He has been having some increase in loose stool but believes this is because he was changed to Metformin and has noticed that if he delays taking this medicine until after eating this seems to work better for him.    Denies fever, chills, abdominal pain, weight loss, blood in his stool or symptoms that awaken him from sleep.  Past Medical History:  Diagnosis Date   Allergy    Basal cell carcinoma 01/2013   Nasal and R forearm.  [facility]   CAD S/P percutaneous coronary angioplasty    2/19 PCI/DESx1 to mLAD, FFR 0.7 -Sierra DES 2.75 mm x 18 mm   Cataract    Diabetes mellitus without complication (HCC)    on PO Meds   Essential hypertension    Family history of colon cancer    Family history of malignant neoplasm of breast    Heart murmur    Hyperlipidemia    statin intolerant -- myalgias, fatigue (tried at least 4)   Polyposis of colon    Rosacea     Past Surgical History:  Procedure Laterality Date    basal cell removal     nose and wrist    BELPHAROPTOSIS REPAIR     CORONARY CT ANGIOGRAM  09/2017   Coronary Calcium Score: 111. Coronary calcification noted in the LEFT MAIN (LM) and prox LAD.  LM < 50% calcified stenosis.  Mid LAD 50-75% plaque noted CT FFR --> positive at 0.80.  Recommend CARDIAC CATHETERIZATION   CORONARY STENT INTERVENTION N/A 11/12/2017   Procedure: CORONARY STENT INTERVENTION;  Surgeon: Leonie Man, MD;  Location: [place] [facility];  Service: Cardiovascular:  DES PCI:  STENT SIERRA 2.75 X 18 MM - Post intervention, there is a 0% residual stenosis.   ETT/GXT: Graded Exercise Tolerance Test  08/2015    Exercised for 9:01 min --> blunted blood pressure response no EKG changes.  No chest pain.  Low Risk   EYE SURGERY     Cataract surgery   eyelid surgery     INTRAVASCULAR PRESSURE WIRE/FFR STUDY N/A 11/12/2017   Procedure: INTRAVASCULAR PRESSURE WIRE/FFR STUDY;  Surgeon: Leonie Man, MD;  Location: [place] [facility];  Service: Cardiovascular;  Laterality: LAD -FFR 0.76   LEFT HEART CATH AND CORONARY ANGIOGRAPHY N/A 11/12/2017   Procedure: LEFT HEART CATH AND CORONARY ANGIOGRAPHY;  Surgeon: Leonie Man, MD;  Location: [place] [facility]: 70%  P-M LAD (FFR 0.76).  40% mid LAD, 50% ostial ramus.  EF 55-60%.  Mildly elevated LVEDP.   SHOULDER OPEN ROTATOR CUFF REPAIR  1983   TRANSTHORACIC ECHOCARDIOGRAM  08/2017   EF 55-60%.  Mild aortic stenosis (mean gradient 11 mmH.)  GR 1 DD.  Mild LA dilation.    Current Outpatient Medications  Medication Sig Dispense Refill   blood glucose meter kit and supplies Use to test blood sugar daily. Dx: E11.9 1 each 0   clopidogrel (PLAVIX) 75 MG tablet TAKE 1 TABLET BY MOUTH EVERY DAY 90 tablet 1   Continuous Blood Gluc Sensor (FREESTYLE LIBRE 2 SENSOR) MISC Apply 1 sensor ever 14 days 6 each 1   ezetimibe (ZETIA) 10 MG tablet Take 1 tablet (10 mg total) by mouth daily. 90 tablet 3   glipiZIDE (GLUCOTROL) 5 MG tablet Take 1  tablet (5 mg total) by mouth 2 (two) times daily before a meal. 180 tablet 3   Lancets MISC Use to test blood sugar daily. Dx code :E11.9 100 each 3   losartan (COZAAR) 50 MG tablet Take 1 tablet (50 mg total) by mouth daily. 90 tablet 3   metFORMIN (GLUCOPHAGE) 500 MG tablet Take 1 tablet (500 mg total) by mouth 2 (two) times daily with a meal. 180 tablet 3   metoprolol succinate (TOPROL-XL) 25 MG 24 hr tablet Take 1 tablet (25 mg total) by mouth daily. 90 tablet 3   Multiple Vitamin (MULTIVITAMIN) tablet Take 1 tablet by mouth daily.     ONETOUCH ULTRA test strip USE TO TEST BLOOD SUGAR ONCE DAILY 100 strip 11   No current facility-administered medications for this visit.    Allergies as of 09/03/2021 - Review Complete 09/03/2021  Allergen Reaction Noted   Januvia [sitagliptin] Other (See Comments) 10/01/2018   Lisinopril Cough 09/11/2017   Statins Other (See Comments) 08/30/2016    Family History  Problem Relation Age of Onset   Leukemia Mother 6       d. 60   Stroke Father 32       as a complication of CABG   Colon cancer Father 62   Coronary artery disease Father 49       - Sx was DOE - MV CAD   Peripheral Artery Disease Father        presumptive   Diabetes Sister    Sudden Cardiac Death Sister 73       presumed MI   Heart attack Brother 81       During throat surgery   Congestive Heart Failure Brother        Died @ 48 - ? CHF / ICM   Breast cancer Maternal Grandmother        dx in her 48s   Stroke Maternal Grandfather    Diabetes Brother 45       on lots of meds   Diabetes Brother    Diabetes Paternal Grandmother    Rheumatic fever Brother        childhood RF --> Valvular Dz - valve Sgx,  died young   Stomach cancer Neg Hx    Esophageal cancer Neg Hx     Social History   Socioeconomic History   Marital status: Married    Spouse name: Gregory Mathews   Number of children: 4   Years of education: 20   Highest education level: Not on file  Occupational History    Occupation: bus Engineer, manufacturing systems: [employer]  Tobacco Use   Smoking status: Former    Packs/day: 1.00    Years: 15.00    Pack years: 15.00    Types: Cigarettes    Quit date: 07/09/1982    Years since quitting: 39.1   Smokeless tobacco: Never  Substance and Sexual Activity   Alcohol use: No   Drug use: No   Sexual activity: Yes    Partners: Female  Other Topics Concern   Not on file  Social History Narrative   Marital status: married x 36 years; happily married.   Lives: with wife       Children:  4 children; 4 grandchildren. Adult children all live locally.          Employment: retired from bus transportation; PRN driving now activity bus. [employer].  Chicken farming.      Tobacco:  In past; smoked x 15 years; quit 35 years ago.      Alcohol: none      ADLs: independent with all ADLs.  Does not use assistant device with ambulation.      Living Will:  Has living will; desires FULL CODE; no prolonged measures.      Doctoral degree in Biblical Studies.          Diet: eats twice daily, eggs,sausage,and fruits for breakfast; chicken (fried or broil), occassional pizza, salads, steak and pork chops.        Exercise: cardiac rehab, transition to [facility] - 45 minutes walks and resistance 5x/week   Social Determinants of Health   Financial Resource Strain: Not on file  Food Insecurity: Not on file  Transportation Needs: Not on file  Physical Activity: Not on file  Stress: Not on file  Social Connections: Not on file  Intimate Partner Violence: Not on file    Review of Systems:    Constitutional: No weight loss, fever or chills Skin: No rash  Cardiovascular: No chest pain  Respiratory: No SOB   Physical Exam:  Vital signs: BP 130/74   Pulse 68   Ht '5\' 10"'  (1.778 m)   Wt 216 lb (98 kg)   BMI 30.99 kg/m   Constitutional:   Pleasant Caucasian male appears to be in NAD, Well developed, Well nourished, alert and cooperative Respiratory:  Respirations even and unlabored. Lungs clear to auscultation bilaterally.   No wheezes, crackles, or rhonchi.  Cardiovascular: Normal S1, S2. No MRG. Regular rate and rhythm. No peripheral edema, cyanosis or pallor.  Gastrointestinal:  Soft, nondistended, nontender. No rebound or guarding. Normal bowel sounds. No appreciable masses or hepatomegaly. Rectal:  Not performed.  Psychiatric: Oriented to person, place and time. Demonstrates good judgement and reason without abnormal affect or behaviors.  RELEVANT LABS AND IMAGING: CBC    Component Value Date/Time   WBC 7.7 08/13/2021 0847   RBC 4.28 08/13/2021 0847   HGB 13.3 08/13/2021 0847   HGB 13.3 11/18/2019 1013   HCT 39.0 08/13/2021 0847   HCT 39.2 11/18/2019 1013   PLT 205.0 08/13/2021 0847   PLT 210 11/18/2019 1013   MCV 91.2 08/13/2021 0847   MCV 92 11/18/2019 1013   MCH 31.2 11/18/2019 1013   MCH 30.6 08/14/2016 0846   MCHC 34.1 08/13/2021 0847   RDW 13.5 08/13/2021 0847   RDW 13.0 11/18/2019 1013   LYMPHSABS 2.1 08/13/2021 0847   LYMPHSABS 2.5 11/18/2019 1013   MONOABS 0.4 08/13/2021 0847   EOSABS 0.3 08/13/2021 0847   EOSABS 0.3 11/18/2019 1013   BASOSABS 0.1 08/13/2021 0847  BASOSABS 0.1 11/18/2019 1013    CMP     Component Value Date/Time   NA 138 08/13/2021 0847   NA 138 11/16/2020 0857   K 4.3 08/13/2021 0847   CL 102 08/13/2021 0847   CO2 28 08/13/2021 0847   GLUCOSE 111 (H) 08/13/2021 0847   BUN 21 08/13/2021 0847   BUN 16 11/16/2020 0857   CREATININE 0.92 08/13/2021 0847   CREATININE 0.79 08/14/2016 0846   CALCIUM 10.3 08/13/2021 0847   PROT 7.4 08/13/2021 0847   PROT 7.2 11/16/2020 0857   ALBUMIN 4.7 08/13/2021 0847   ALBUMIN 4.6 11/16/2020 0857   AST 26 08/13/2021 0847   ALT 29 08/13/2021 0847   ALKPHOS 54 08/13/2021 0847   BILITOT 0.7 08/13/2021 0847   BILITOT 0.6 11/16/2020 0857   GFRNONAA 85 11/16/2020 0857   GFRNONAA 81 08/01/2015 1457   GFRAA 98 11/16/2020 0857   GFRAA >89 08/01/2015  1457    Assessment: 1.  Family history of colon cancer: In his father in his 83s 2.  Personal history of adenomatous polyps: Last colonoscopy in February 2022 with recommendations to repeat in 1 year given quantity of polyps, patient did see genetic counselor 3.  Chronic anticoagulation for CAD: On Plavix  Plan: 1.  Scheduled patient for repeat colonoscopy in the [place].  This is scheduled with Dr. [name] given the patient only had 2 days that this procedure could be done.  Did provide the patient a detailed list of risks for the procedure and he agrees to proceed.  Patient will continue to follow with Dr. Tarri Glenn his primary GI physician after time of procedure. Patient is appropriate for endoscopic procedure(s) in the ambulatory ([place]) setting.  2.  Patient advised to hold his Plavix for 5 days prior to time of procedure.  We will communicate with his prescribing physician to ensure this is acceptable for him. 3.  Patient to follow in clinic per recommendations after time of procedure.  Ellouise Newer, PA-C [facility] 09/03/2021, 9:50 AM  Cc: Horald Pollen, *

## 2021-09-03 NOTE — Progress Notes (Signed)
Reviewed and agree with management plans. Genetic testing negative. Appreciate Dr. [name]' availability with performing his surveillance colonoscopy given his schedule limitations.   Deshay Blumenfeld L. Tarri Glenn, MD, MPH

## 2021-09-03 NOTE — Patient Instructions (Signed)
You will be contacted by our office prior to your procedure for directions on holding your Plavix.  If you do not hear from our office 1 week prior to your scheduled procedure, please call [phone] to discuss.    You have been scheduled for a colonoscopy. Please follow written instructions given to you at your visit today.  Please pick up your prep supplies at the pharmacy within the next 1-3 days. If you use inhalers (even only as needed), please bring them with you on the day of your procedure.  If you are age 73 or older, your body mass index should be between 23-30. Your Body mass index is 30.99 kg/m. If this is out of the aforementioned range listed, please consider follow up with your Primary Care Provider.  If you are age 50 or younger, your body mass index should be between 19-25. Your Body mass index is 30.99 kg/m. If this is out of the aformentioned range listed, please consider follow up with your Primary Care Provider.   ________________________________________________________  The [facility] providers would like to encourage you to use [facility] to communicate with providers for non-urgent requests or questions.  Due to long hold times on the telephone, sending your provider a message by [facility] may be a faster and more efficient way to get a response.  Please allow 48 business hours for a response.  Please remember that this is for non-urgent requests.  _______________________________________________________

## 2021-09-04 NOTE — Progress Notes (Signed)
Glad I could be of service. I reviewed your note, thanks.  Wilfrid Lund, MD

## 2021-09-13 ENCOUNTER — Telehealth: Payer: Self-pay | Admitting: *Deleted

## 2021-09-13 NOTE — Telephone Encounter (Signed)
° °  Gregory Mathews. [birth year] [id number]  Dear Dr. Mitchel Honour:  We have scheduled the above named patient for a(n) colonoscopy procedure. Our records show that (s)he is on anticoagulation therapy.  Please advise as to whether the patient may come off their therapy of Plavix 5 days prior to their procedure which is scheduled for Monday 10/08/21.  Please route your response to Caryl Asp, [place] or fax response to [phone].  Sincerely,   Caryl Asp, [facility]

## 2021-09-19 NOTE — Telephone Encounter (Signed)
Okay for patient to stop Plavix for 5 days prior to procedure.  Thanks.

## 2021-09-19 NOTE — Telephone Encounter (Signed)
I spoke with Mr Gregory Mathews and he verbalized understanding to hold his Plavix for 5 days prior to his procedure 10/08/21.

## 2021-09-21 ENCOUNTER — Ambulatory Visit: Payer: Medicare Other | Admitting: Podiatry

## 2021-09-21 ENCOUNTER — Other Ambulatory Visit: Payer: Self-pay

## 2021-09-21 DIAGNOSIS — L309 Dermatitis, unspecified: Secondary | ICD-10-CM

## 2021-09-21 MED ORDER — CLOTRIMAZOLE-BETAMETHASONE 1-0.05 % EX CREA: TOPICAL_CREAM | Freq: Two times a day (BID) | CUTANEOUS | Status: AC

## 2021-09-21 NOTE — Progress Notes (Signed)
Subjective:   Patient ID: Gregory Mathews., male   DOB: 75 y.o.   MRN: [id number]   HPI Patient presents with 2 small patches on the top of both feet which been present for around 3 months itchy and can get irritated at times.  Has tried Neosporin and cortisone without current relief.  Patient does not smoke and is active   Review of Systems  All other systems reviewed and are negative.      Objective:  Physical Exam Vitals and nursing note reviewed.  Constitutional:      Appearance: He is well-developed.  Pulmonary:     Effort: Pulmonary effort is normal.  Musculoskeletal:        General: Normal range of motion.  Skin:    General: Skin is warm.  Neurological:     Mental Status: He is alert.    Neurovascular status intact muscle strength found to be adequate range of motion found to be adequate.  Dorsum of both feet I found her to be small areas of redness with irritation measuring about 2 cm x 2 cm localized no proximal edema erythema drainage noted no indications of pathology associated with diabetes     Assessment:  Appears to be more of a dermatitis or a possible low-grade fungal infection with possibility of some kind of allergic reaction     Plan:  H&P reviewed condition and I have recommended utilizing a cream that has combination antifungal and steroid.  This was written for him Lotrisone and he will be seen back if symptoms persist with all education given today

## 2021-09-27 ENCOUNTER — Telehealth: Payer: Self-pay | Admitting: *Deleted

## 2021-09-27 NOTE — Telephone Encounter (Unsigned)
Patient is calling for status of medication (Lotrisone cream),not at pharmacy.Please advise. Resent prescription.

## 2021-10-02 ENCOUNTER — Telehealth (INDEPENDENT_AMBULATORY_CARE_PROVIDER_SITE_OTHER): Payer: Medicare Other | Admitting: Family Medicine

## 2021-10-02 ENCOUNTER — Encounter: Payer: Self-pay | Admitting: Family Medicine

## 2021-10-02 ENCOUNTER — Other Ambulatory Visit: Payer: Self-pay

## 2021-10-02 DIAGNOSIS — R0981 Nasal congestion: Secondary | ICD-10-CM

## 2021-10-02 MED ORDER — PROMETHAZINE-DM 6.25-15 MG/5ML PO SYRP: 5.0000 mL | ORAL_SOLUTION | Freq: Four times a day (QID) | ORAL | 0 refills | Status: DC | PRN

## 2021-10-02 MED ORDER — AMOXICILLIN-POT CLAVULANATE 875-125 MG PO TABS: 1.0000 | ORAL_TABLET | Freq: Two times a day (BID) | ORAL | 0 refills | Status: DC

## 2021-10-02 NOTE — Progress Notes (Signed)
Virtual Visit via Video Note  I connected with Gregory Mathews  on 10/02/21 at  4:00 PM EST by a video enabled telemedicine application and verified that I am speaking with the correct person using two identifiers.  Location patient:  Location provider:work or home office Persons participating in the virtual visit: patient, provider  I discussed the limitations and requested verbal permission for telemedicine visit. The patient expressed understanding and agreed to proceed.   HPI:  Acute telemedicine visit for cough and congestion: -Onset: 2 days ago, has a hx of sinus issues and uses nasal spray, but worse the last few days -he is requesting Augmentin as feels he has a sinus infection and reports this is what they gave him in the past for this -took a covid test that negative -Symptoms include: nasal sinus congestion, sore throat, cough, pnd -Denies:fever, NVD, body aches, CP, SOB -wife was sick about a week ago, he went to a funeral the day before he got sick -Pertinent past medical history: see below -Pertinent medication allergies: Allergies  Allergen Reactions   Januvia [Sitagliptin] Other (See Comments)    Gi upset and kidney problems   Lisinopril Cough   Statins Other (See Comments)    Severe leg pain & fatigue -- tried at least 4 (even low dose)  -COVID-19 vaccine status:  Immunization History  Administered Date(s) Administered   Fluad Quad(high Dose 65+) 07/08/2019, 07/13/2020, 05/24/2021   Influenza Split 07/09/2012   Influenza, High Dose Seasonal PF 06/12/2018   Influenza,inj,Quad PF,6+ Mos 07/20/2013, 07/19/2014, 08/01/2015, 08/14/2016, 09/10/2017   Influenza,trivalent, recombinat, inj, PF 06/13/2011   Pneumococcal Conjugate-13 08/22/2014   Pneumococcal Polysaccharide-23 07/09/2012, 03/11/2018   Tdap 06/13/2011, 08/01/2015   Zoster Recombinat (Shingrix) 05/24/2021, 08/13/2021   Zoster, Live 09/23/2005, 02/10/2012     ROS: See pertinent positives and negatives per  HPI.  Past Medical History:  Diagnosis Date   Allergy    Basal cell carcinoma 01/2013   Nasal and R forearm.  [facility]   CAD S/P percutaneous coronary angioplasty    2/19 PCI/DESx1 to mLAD, FFR 0.7 -Sierra DES 2.75 mm x 18 mm   Cataract    Diabetes mellitus without complication (HCC)    on PO Meds   Essential hypertension    Family history of colon cancer    Family history of malignant neoplasm of breast    Heart murmur    Hyperlipidemia    statin intolerant -- myalgias, fatigue (tried at least 4)   Polyposis of colon    Rosacea     Past Surgical History:  Procedure Laterality Date   basal cell removal     nose and wrist    BELPHAROPTOSIS REPAIR     CORONARY CT ANGIOGRAM  09/2017   Coronary Calcium Score: 111. Coronary calcification noted in the LEFT MAIN (LM) and prox LAD.  LM < 50% calcified stenosis.  Mid LAD 50-75% plaque noted CT FFR --> positive at 0.80.  Recommend CARDIAC CATHETERIZATION   CORONARY STENT INTERVENTION N/A 11/12/2017   Procedure: CORONARY STENT INTERVENTION;  Surgeon: Leonie Man, MD;  Location: [place] [facility];  Service: Cardiovascular:  DES PCI:  STENT SIERRA 2.75 X 18 MM - Post intervention, there is a 0% residual stenosis.   ETT/GXT: Graded Exercise Tolerance Test  08/2015    Exercised for 9:01 min --> blunted blood pressure response no EKG changes.  No chest pain.  Low Risk   EYE SURGERY     Cataract surgery   eyelid surgery  INTRAVASCULAR PRESSURE WIRE/FFR STUDY N/A 11/12/2017   Procedure: INTRAVASCULAR PRESSURE WIRE/FFR STUDY;  Surgeon: Leonie Man, MD;  Location: [place] [facility];  Service: Cardiovascular;  Laterality: LAD -FFR 0.76   LEFT HEART CATH AND CORONARY ANGIOGRAPHY N/A 11/12/2017   Procedure: LEFT HEART CATH AND CORONARY ANGIOGRAPHY;  Surgeon: Leonie Man, MD;  Location: [facility]: 70% P-M LAD (FFR 0.76).  40% mid LAD, 50% ostial ramus.  EF 55-60%.  Mildly elevated LVEDP.   SHOULDER OPEN ROTATOR  CUFF REPAIR  1983   TRANSTHORACIC ECHOCARDIOGRAM  08/2017   EF 55-60%.  Mild aortic stenosis (mean gradient 11 mmH.)  GR 1 DD.  Mild LA dilation.     Current Outpatient Medications:    amoxicillin-clavulanate (AUGMENTIN) 875-125 MG tablet, Take 1 tablet by mouth 2 (two) times daily., Disp: 20 tablet, Rfl: 0   blood glucose meter kit and supplies, Use to test blood sugar daily. Dx: E11.9, Disp: 1 each, Rfl: 0   clopidogrel (PLAVIX) 75 MG tablet, TAKE 1 TABLET BY MOUTH EVERY DAY, Disp: 90 tablet, Rfl: 1   ezetimibe (ZETIA) 10 MG tablet, Take 1 tablet (10 mg total) by mouth daily., Disp: 90 tablet, Rfl: 3   Lancets MISC, Use to test blood sugar daily. Dx code :E11.9, Disp: 100 each, Rfl: 3   metFORMIN (GLUCOPHAGE) 500 MG tablet, Take 1 tablet (500 mg total) by mouth 2 (two) times daily with a meal., Disp: 180 tablet, Rfl: 3   metoprolol succinate (TOPROL-XL) 25 MG 24 hr tablet, Take 1 tablet (25 mg total) by mouth daily., Disp: 90 tablet, Rfl: 3   Multiple Vitamin (MULTIVITAMIN) tablet, Take 1 tablet by mouth daily., Disp: , Rfl:    ONETOUCH ULTRA test strip, USE TO TEST BLOOD SUGAR ONCE DAILY, Disp: 100 strip, Rfl: 11   promethazine-dextromethorphan (PROMETHAZINE-DM) 6.25-15 MG/5ML syrup, Take 5 mLs by mouth 4 (four) times daily as needed for cough., Disp: 118 mL, Rfl: 0   glipiZIDE (GLUCOTROL) 5 MG tablet, Take 1 tablet (5 mg total) by mouth 2 (two) times daily before a meal., Disp: 180 tablet, Rfl: 3   losartan (COZAAR) 50 MG tablet, Take 1 tablet (50 mg total) by mouth daily., Disp: 90 tablet, Rfl: 3  Current Facility-Administered Medications:    clotrimazole-betamethasone (LOTRISONE) cream, , Topical, BID, Regal, Tamala Fothergill, DPM  EXAM:  VITALS per patient if applicable:  GENERAL: alert, oriented, appears well and in no acute distress  HEENT: atraumatic, conjunttiva clear, no obvious abnormalities on inspection of external nose and ears  NECK: normal movements of the head and  neck  LUNGS: on inspection no signs of respiratory distress, breathing rate appears normal, no obvious gross SOB, gasping or wheezing  CV: no obvious cyanosis  MS: moves all visible extremities without noticeable abnormality  PSYCH/NEURO: pleasant and cooperative, no obvious depression or anxiety, speech and thought processing grossly intact  ASSESSMENT AND PLAN:  Discussed the following assessment and plan:  Nasal sinus congestion  -we discussed possible serious and likely etiologies, options for evaluation and workup, limitations of telemedicine visit vs in person visit, treatment, treatment risks and precautions. Pt is agreeable to treatment via telemedicine at this moment. Query [facility], covid with false neg early testing vs other. He is concerned for a bacterial sinusitis and is adamant about taking augmentin. Explained viral vs bacterial pathogens, presentation, abx risks. He has opted to try cough medication and nasal saline and delayed abx if worsening or not improving as expected. Also advise  retesting for covid and advised can contact [place] pharmacy if positive and desires antiviral.  Advised to seek prompt virtual visit or in person care if worsening, new symptoms arise, or if is not improving with treatment as expected per our conversation of expected course.  I discussed the assessment and treatment plan with the patient. The patient was provided an opportunity to ask questions and all were answered. The patient agreed with the plan and demonstrated an understanding of the instructions.     Lucretia Kern, DO

## 2021-10-02 NOTE — Patient Instructions (Signed)
-  nasal saline twice daily  -cough medication per below as needed  -viral sinus infections usually last 1-2 weeks  -I sent the medication(s) we discussed to your pharmacy: Meds ordered this encounter  Medications   promethazine-dextromethorphan (PROMETHAZINE-DM) 6.25-15 MG/5ML syrup    Sig: Take 5 mLs by mouth 4 (four) times daily as needed for cough.    Dispense:  118 mL    Refill:  0   amoxicillin-clavulanate (AUGMENTIN) 875-125 MG tablet    Sig: Take 1 tablet by mouth 2 (two) times daily.    Dispense:  20 tablet    Refill:  0   Can take the antibiotic (Augmentin) if worsening or not improving as expected.   Retest for covid. If positive, within fist 5 days of symptoms and you desire an antiviral please contact a [facility] pharmacy or schedule a follow up video visit.   I hope you are feeling better soon!  Seek in person care promptly if your symptoms worsen, new concerns arise or you are not improving with treatment.  It was nice to meet you today. I help [facility] out with telemedicine visits on Tuesdays and Thursdays and am happy to help if you need a virtual follow up visit on those days. Otherwise, if you have any concerns or questions following this visit please schedule a follow up visit with your Primary Care office or seek care at a local urgent care clinic to avoid delays in care

## 2021-10-03 ENCOUNTER — Other Ambulatory Visit: Payer: Self-pay | Admitting: Emergency Medicine

## 2021-10-03 ENCOUNTER — Telehealth: Payer: Self-pay | Admitting: Podiatry

## 2021-10-03 ENCOUNTER — Telehealth: Payer: Self-pay | Admitting: Emergency Medicine

## 2021-10-03 ENCOUNTER — Telehealth: Payer: Self-pay

## 2021-10-03 DIAGNOSIS — R051 Acute cough: Secondary | ICD-10-CM

## 2021-10-03 DIAGNOSIS — E1165 Type 2 diabetes mellitus with hyperglycemia: Secondary | ICD-10-CM

## 2021-10-03 MED ORDER — HYDROCODONE BIT-HOMATROP MBR 5-1.5 MG/5ML PO SOLN: 5.0000 mL | Freq: Every evening | ORAL | 0 refills | Status: DC | PRN

## 2021-10-03 NOTE — Telephone Encounter (Signed)
Connected to [facility] 1.10.2023.   Caller states he was prescribe medications today from his virtual visit today. Caller states he believes he was given the wrong medications from what the told doctor told him he would be receiving.  Please call pt to confirm prescribed medications.

## 2021-10-03 NOTE — Telephone Encounter (Signed)
Patient had a virtual appointment with Dr. Colin Benton yesterday for cough and congestion. He states that he prefers to have the Hycodan cough syrup over promethazine. He states that the other medication dose not help his cough and he would like to get some sleep. They have it in stock at his pharmacy on record [facility], [address],

## 2021-10-03 NOTE — Telephone Encounter (Signed)
Pharmacy said medication did not transmit clotrimazole-betamethasone (LOTRISONE) cream    Please resend to [facility]  - [address]

## 2021-10-03 NOTE — Telephone Encounter (Signed)
Prescription for Hycodan sent to pharmacy requested.  Thanks.

## 2021-10-03 NOTE — Telephone Encounter (Signed)
Patient calling to check on medication he states was prescribed at yesterday's vv w/ Dr. [name]  Patient states he was told he would be prescribed hydrocodone cough syrup  Advised patient his message has been routed to his provider and to give provider time to respond

## 2021-10-04 ENCOUNTER — Other Ambulatory Visit: Payer: Self-pay

## 2021-10-04 ENCOUNTER — Telehealth: Payer: Self-pay

## 2021-10-04 ENCOUNTER — Other Ambulatory Visit: Payer: Self-pay | Admitting: Podiatry

## 2021-10-04 DIAGNOSIS — L309 Dermatitis, unspecified: Secondary | ICD-10-CM

## 2021-10-04 MED ORDER — CLOTRIMAZOLE-BETAMETHASONE 1-0.05 % EX CREA: TOPICAL_CREAM | Freq: Two times a day (BID) | CUTANEOUS | Status: AC

## 2021-10-04 NOTE — Progress Notes (Signed)
Patient is requesting a refill of the following medications: Requested Prescriptions   Pending Prescriptions Disp Refills   chlorpheniramine-HYDROcodone (TUSSIONEX PENNKINETIC ER) 10-8 MG/5ML SUER 115 mL 0    Sig: Take 5 mLs by mouth every 12 (twelve) hours as needed for cough.    Date of patient request: 10/03/21 Last office visit: 10/03/21 Date of last refill: 08/14/21 Last refill amount:  Follow up time period per chart:   Called and spoke with patient he states that the cough syrup sent incorrect and the above medication is what he prefers. Apologized for the miscommunication.

## 2021-10-04 NOTE — Telephone Encounter (Signed)
sent 

## 2021-10-04 NOTE — Telephone Encounter (Addendum)
Pt calling in for a urology referral for prostate issues

## 2021-10-05 ENCOUNTER — Other Ambulatory Visit: Payer: Self-pay | Admitting: Emergency Medicine

## 2021-10-05 DIAGNOSIS — N429 Disorder of prostate, unspecified: Secondary | ICD-10-CM

## 2021-10-05 MED ORDER — HYDROCOD POLST-CPM POLST ER 10-8 MG/5ML PO SUER: 5.0000 mL | Freq: Two times a day (BID) | ORAL | 0 refills | Status: DC | PRN

## 2021-10-05 NOTE — Telephone Encounter (Signed)
Referral to urology sent today.

## 2021-10-08 ENCOUNTER — Other Ambulatory Visit: Payer: Self-pay

## 2021-10-08 ENCOUNTER — Encounter: Payer: Self-pay | Admitting: Gastroenterology

## 2021-10-08 ENCOUNTER — Ambulatory Visit (AMBULATORY_SURGERY_CENTER): Payer: Medicare Other | Admitting: Gastroenterology

## 2021-10-08 VITALS — BP 111/57 | HR 64 | Temp 97.5°F | Resp 11 | Ht 70.0 in | Wt 216.0 lb

## 2021-10-08 DIAGNOSIS — D122 Benign neoplasm of ascending colon: Secondary | ICD-10-CM | POA: Diagnosis not present

## 2021-10-08 DIAGNOSIS — E119 Type 2 diabetes mellitus without complications: Secondary | ICD-10-CM | POA: Diagnosis not present

## 2021-10-08 DIAGNOSIS — Z86010 Personal history of colonic polyps: Secondary | ICD-10-CM

## 2021-10-08 DIAGNOSIS — I10 Essential (primary) hypertension: Secondary | ICD-10-CM | POA: Diagnosis not present

## 2021-10-08 MED ORDER — SODIUM CHLORIDE 0.9 % IV SOLN: 500.0000 mL | Freq: Once | INTRAVENOUS | Status: DC

## 2021-10-08 NOTE — Op Note (Signed)
[facility] Patient Name: Gregory Mathews Procedure Date: 10/08/2021 2:41 PM MRN: [id number] Endoscopist: Melstone. [name] , MD Age: 76 Referring MD:  Date of Birth: 1946-09-18 Gender: Male Account #: [id number] Procedure:                Colonoscopy Indications:              Surveillance: History of numerous (> 10) adenomas                            on last colonoscopy (< 3 yrs - Feb 2022 - Dr.                            Tarri Glenn) Medicines:                Monitored Anesthesia Care Procedure:                Pre-Anesthesia Assessment:                           - Prior to the procedure, a History and Physical                            was performed, and patient medications and                            allergies were reviewed. The patient's tolerance of                            previous anesthesia was also reviewed. The risks                            and benefits of the procedure and the sedation                            options and risks were discussed with the patient.                            All questions were answered, and informed consent                            was obtained. Prior Anticoagulants: The patient has                            taken Plavix (clopidogrel), last dose was 5 days                            prior to procedure. ASA Grade Assessment: III - A                            patient with severe systemic disease. After                            reviewing the risks and benefits, the patient was  deemed in satisfactory condition to undergo the                            procedure.                           After obtaining informed consent, the colonoscope                            was passed under direct vision. Throughout the                            procedure, the patient's blood pressure, pulse, and                            oxygen saturations were monitored continuously. The                            Olympus  CF-HQ190L [id number]) Colonoscope was                            introduced through the anus and advanced to the the                            cecum, identified by appendiceal orifice and                            ileocecal valve. The colonoscopy was performed                            without difficulty. The patient tolerated the                            procedure well. The quality of the bowel                            preparation was excellent. The ileocecal valve,                            appendiceal orifice, and rectum were photographed.                            The bowel preparation used was SUPREP. Scope In: 2:50:08 PM Scope Out: 3:07:55 PM Scope Withdrawal Time: 0 hours 13 minutes 55 seconds  Total Procedure Duration: 0 hours 17 minutes 47 seconds  Findings:                 The perianal and digital rectal examinations were                            normal.                           Repeat examination of right colon under NBI  performed.                           Two sessile polyps were found in the ascending                            colon. The polyps were diminutive in size. These                            polyps were removed with a cold snare. Resection                            and retrieval were complete.                           Internal hemorrhoids were found.                           The exam was otherwise without abnormality on                            direct and retroflexion views. Complications:            No immediate complications. Estimated Blood Loss:     Estimated blood loss was minimal. Impression:               - Two diminutive polyps in the ascending colon,                            removed with a cold snare. Resected and retrieved.                           - Internal hemorrhoids.                           - The examination was otherwise normal on direct                            and retroflexion  views. Recommendation:           - Patient has a contact number available for                            emergencies. The signs and symptoms of potential                            delayed complications were discussed with the                            patient. Return to normal activities tomorrow.                            Written discharge instructions were provided to the                            patient.                           -  Resume previous diet.                           - Resume Plavix (clopidogrel) at prior dose                            tomorrow.                           - Await pathology results.                           - Repeat colonoscopy is recommended for                            surveillance. The colonoscopy date will be                            determined after pathology results from today's                            exam become available for review. Jory Tanguma L. [name], MD 10/08/2021 3:14:47 PM This report has been signed electronically.

## 2021-10-08 NOTE — Progress Notes (Signed)
Report to PACU, RN, vss, BBS= Clear.  

## 2021-10-08 NOTE — Patient Instructions (Signed)
Read all of the handouts given to you by your recovery room nurse.  You may resume your plavix tomorrow at the prior dose.  YOU HAD AN ENDOSCOPIC PROCEDURE TODAY AT [facility]:   Refer to the procedure report that was given to you for any specific questions about what was found during the examination.  If the procedure report does not answer your questions, please call your gastroenterologist to clarify.  If you requested that your care partner not be given the details of your procedure findings, then the procedure report has been included in a sealed envelope for you to review at your convenience later.  YOU SHOULD EXPECT: Some feelings of bloating in the abdomen. Passage of more gas than usual.  Walking can help get rid of the air that was put into your GI tract during the procedure and reduce the bloating. If you had a lower endoscopy (such as a colonoscopy or flexible sigmoidoscopy) you may notice spotting of blood in your stool or on the toilet paper. If you underwent a bowel prep for your procedure, you may not have a normal bowel movement for a few days.  Please Note:  You might notice some irritation and congestion in your nose or some drainage.  This is from the oxygen used during your procedure.  There is no need for concern and it should clear up in a day or so.  SYMPTOMS TO REPORT IMMEDIATELY:  Following lower endoscopy (colonoscopy or flexible sigmoidoscopy):  Excessive amounts of blood in the stool  Significant tenderness or worsening of abdominal pains  Swelling of the abdomen that is new, acute  Fever of 100F or higher   For urgent or emergent issues, a gastroenterologist can be reached at any hour by calling [phone]. Do not use MyChart messaging for urgent concerns.    DIET:  We do recommend a small meal at first, but then you may proceed to your regular diet.  Drink plenty of fluids but you should avoid alcoholic beverages for 24 hours.  ACTIVITY:   You should plan to take it easy for the rest of today and you should NOT DRIVE or use heavy machinery until tomorrow (because of the sedation medicines used during the test).    FOLLOW UP: Our staff will call the number listed on your records 48-72 hours following your procedure to check on you and address any questions or concerns that you may have regarding the information given to you following your procedure. If we do not reach you, we will leave a message.  We will attempt to reach you two times.  During this call, we will ask if you have developed any symptoms of COVID 19. If you develop any symptoms (ie: fever, flu-like symptoms, shortness of breath, cough etc.) before then, please call [phone].  If you test positive for Covid 19 in the 2 weeks post procedure, please call and report this information to [place].    If any biopsies were taken you will be contacted by phone or by letter within the next 1-3 weeks.  Please call us at [phone] if you have not heard about the biopsies in 3 weeks.    SIGNATURES/CONFIDENTIALITY: You and/or your care partner have signed paperwork which will be entered into your electronic medical record.  These signatures attest to the fact that that the information above on your After Visit Summary has been reviewed and is understood.  Full responsibility of the confidentiality of this discharge information  lies with you and/or your care-partner.

## 2021-10-08 NOTE — Progress Notes (Signed)
Called to room to assist during endoscopic procedure.  Patient ID and intended procedure confirmed with present staff. Received instructions for my participation in the procedure from the performing physician.  

## 2021-10-08 NOTE — Progress Notes (Signed)
History and Physical:  This patient presents for endoscopic testing for: Encounter Diagnosis  Name Primary?   History of colon polyps Yes    At least 10 TAs Feb 2022 Father had CRC On plavix Patient denies chronic abdominal pain, rectal bleeding, constipation or diarrhea. Clinic note from 09/03/21  ROS: Patient denies chest pain or cough   Past Medical History: Past Medical History:  Diagnosis Date   Allergy    Basal cell carcinoma 01/2013   Nasal and R forearm.  [facility]   CAD S/P percutaneous coronary angioplasty    2/19 PCI/DESx1 to mLAD, FFR 0.7 -[facility] DES 2.75 mm x 18 mm   Cataract    Diabetes mellitus without complication (HCC)    on PO Meds   Essential hypertension    Family history of colon cancer    Family history of malignant neoplasm of breast    Heart murmur    Hyperlipidemia    statin intolerant -- myalgias, fatigue (tried at least 4)   Polyposis of colon    Rosacea      Past Surgical History: Past Surgical History:  Procedure Laterality Date   basal cell removal     nose and wrist    BELPHAROPTOSIS REPAIR     CORONARY CT ANGIOGRAM  09/2017   Coronary Calcium Score: 111. Coronary calcification noted in the LEFT MAIN (LM) and prox LAD.  LM < 50% calcified stenosis.  Mid LAD 50-75% plaque noted CT FFR --> positive at 0.80.  Recommend CARDIAC CATHETERIZATION   CORONARY STENT INTERVENTION N/A 11/12/2017   Procedure: CORONARY STENT INTERVENTION;  Surgeon: Leonie Man, MD;  Location: [facility];  Service: Cardiovascular:  DES PCI:  STENT SIERRA 2.75 X 18 MM - Post intervention, there is a 0% residual stenosis.   ETT/GXT: Graded Exercise Tolerance Test  08/2015    Exercised for 9:01 min --> blunted blood pressure response no EKG changes.  No chest pain.  Low Risk   EYE SURGERY     Cataract surgery   eyelid surgery     INTRAVASCULAR PRESSURE WIRE/FFR STUDY N/A 11/12/2017   Procedure: INTRAVASCULAR PRESSURE WIRE/FFR STUDY;  Surgeon:  Leonie Man, MD;  Location: [facility];  Service: Cardiovascular;  Laterality: LAD -FFR 0.76   LEFT HEART CATH AND CORONARY ANGIOGRAPHY N/A 11/12/2017   Procedure: LEFT HEART CATH AND CORONARY ANGIOGRAPHY;  Surgeon: Leonie Man, MD;  Location: [facility]: 70% P-M LAD (FFR 0.76).  40% mid LAD, 50% ostial ramus.  EF 55-60%.  Mildly elevated LVEDP.   SHOULDER OPEN ROTATOR CUFF REPAIR  1983   TRANSTHORACIC ECHOCARDIOGRAM  08/2017   EF 55-60%.  Mild aortic stenosis (mean gradient 11 mmH.)  GR 1 DD.  Mild LA dilation.    Allergies: Allergies  Allergen Reactions   Januvia [Sitagliptin] Other (See Comments)    Gi upset and kidney problems   Lisinopril Cough   Statins Other (See Comments)    Severe leg pain & fatigue -- tried at least 4 (even low dose)    Outpatient Meds: Current Outpatient Medications  Medication Sig Dispense Refill   clopidogrel (PLAVIX) 75 MG tablet TAKE 1 TABLET BY MOUTH EVERY DAY 90 tablet 1   ezetimibe (ZETIA) 10 MG tablet Take 1 tablet (10 mg total) by mouth daily. 90 tablet 3   glipiZIDE (GLUCOTROL) 5 MG tablet Take 1 tablet (5 mg total) by mouth 2 (two) times daily before a meal. 180 tablet 3  Lancets MISC Use to test blood sugar daily. Dx code :E11.9 100 each 3   losartan (COZAAR) 50 MG tablet Take 1 tablet (50 mg total) by mouth daily. 90 tablet 3   metFORMIN (GLUCOPHAGE) 500 MG tablet Take 1 tablet (500 mg total) by mouth 2 (two) times daily with a meal. 180 tablet 3   metoprolol succinate (TOPROL-XL) 25 MG 24 hr tablet Take 1 tablet (25 mg total) by mouth daily. 90 tablet 3   Multiple Vitamin (MULTIVITAMIN) tablet Take 1 tablet by mouth daily.     ONETOUCH ULTRA test strip USE TO TEST BLOOD SUGAR ONCE DAILY 100 strip 11   blood glucose meter kit and supplies Use to test blood sugar daily. Dx: E11.9 1 each 0   chlorpheniramine-HYDROcodone (TUSSIONEX PENNKINETIC ER) 10-8 MG/5ML SUER Take 5 mLs by mouth every 12 (twelve) hours as needed for  cough. 115 mL 0   Current Facility-Administered Medications  Medication Dose Route Frequency Provider Last Rate Last Admin   0.9 %  sodium chloride infusion  500 mL Intravenous Once Danis, Kirke Corin, MD       clotrimazole-betamethasone (LOTRISONE) cream   Topical BID Wallene Huh, DPM       clotrimazole-betamethasone (LOTRISONE) cream   Topical BID Wallene Huh, DPM          ___________________________________________________________________ Objective   Exam:  BP (!) 132/59    Pulse 69    Temp (!) 97.5 F (36.4 C)    Ht '5\' 10"'  (1.778 m)    Wt 216 lb (98 kg)    SpO2 99%    BMI 30.99 kg/m   CV: RRR without murmur, S1/S2 Resp: clear to auscultation bilaterally, normal RR and effort noted GI: soft, no tenderness, with active bowel sounds.   Assessment: Encounter Diagnosis  Name Primary?   History of colon polyps Yes     Plan: Colonoscopy  The benefits and risks of the planned procedure were described in detail with the patient or (when appropriate) their health care proxy.  Risks were outlined as including, but not limited to, bleeding, infection, perforation, adverse medication reaction leading to cardiac or pulmonary decompensation, pancreatitis (if ERCP).  The limitation of incomplete mucosal visualization was also discussed.  No guarantees or warranties were given.    The patient is appropriate for an endoscopic procedure in the ambulatory setting.   - Wilfrid Lund, MD

## 2021-10-09 NOTE — Telephone Encounter (Signed)
Called patient to inquire if he has a specific urologist he to prefers to be   Patient states he does not know of a urologist within [facility], patient states he will see any urologist as long as they "use mychart"  *see below*

## 2021-10-09 NOTE — Telephone Encounter (Signed)
Please find out who would that be. Thanks.

## 2021-10-09 NOTE — Telephone Encounter (Signed)
Patient states he does not want a referral to [facility]   Patient requesting to be referred to a urologist within the [facility]

## 2021-10-10 ENCOUNTER — Telehealth: Payer: Self-pay

## 2021-10-10 NOTE — Telephone Encounter (Signed)
°  Follow up Call-  Call back number 10/08/2021 11/13/2020  Post procedure Call Back phone  # [phone]  Permission to leave phone message Yes Yes  Some recent data might be hidden     Patient questions:  Do you have a fever, pain , or abdominal swelling? No. Pain Score  0 *  Have you tolerated food without any problems? Yes.    Have you been able to return to your normal activities? Yes.    Do you have any questions about your discharge instructions: Diet   No. Medications  No. Follow up visit  No.  Do you have questions or concerns about your Care? No.  Actions: * If pain score is 4 or above: No action needed, pain <4.

## 2021-10-17 ENCOUNTER — Encounter: Payer: Self-pay | Admitting: Gastroenterology

## 2021-10-17 NOTE — Progress Notes (Signed)
Letter created. Surveillance in 5 years if clinically indicated at that time. I reviewed his genetic counseling results from last year that were negative.

## 2021-10-18 NOTE — Progress Notes (Signed)
Cardiology Clinic Note   Patient Name: Gregory Mathews. Date of Encounter: 10/19/2021  Primary Care Provider:  Horald Pollen, MD Primary Cardiologist:  Gregory Hew, MD  Patient Profile   76 year old male with history of CAD, status post PCI of the LAD due to abnormal coronary CTA and strong family history of CAD, mild aortic valve stenosis, hyperlipidemia on Praluent due to intolerance of statins related to myalgia pain, and hypertension.  Past Medical History    Past Medical History:  Diagnosis Date   Allergy    Basal cell carcinoma 01/2013   Nasal and R forearm.  [facility]   CAD S/P percutaneous coronary angioplasty    2/19 PCI/DESx1 to mLAD, FFR 0.7 -Sierra DES 2.75 mm x 18 mm   Cataract    Diabetes mellitus without complication (HCC)    on PO Meds   Essential hypertension    Family history of colon cancer    Family history of malignant neoplasm of breast    Heart murmur    Hyperlipidemia    statin intolerant -- myalgias, fatigue (tried at least 4)   Polyposis of colon    Rosacea    Past Surgical History:  Procedure Laterality Date   basal cell removal     nose and wrist    BELPHAROPTOSIS REPAIR     CORONARY CT ANGIOGRAM  09/2017   Coronary Calcium Score: 111. Coronary calcification noted in the LEFT MAIN (LM) and prox LAD.  LM < 50% calcified stenosis.  Mid LAD 50-75% plaque noted CT FFR --> positive at 0.80.  Recommend CARDIAC CATHETERIZATION   CORONARY STENT INTERVENTION N/A 11/12/2017   Procedure: CORONARY STENT INTERVENTION;  Surgeon: Gregory Man, MD;  Location: [facility];  Service: Cardiovascular:  DES PCI:  STENT SIERRA 2.75 X 18 MM - Post intervention, there is a 0% residual stenosis.   ETT/GXT: Graded Exercise Tolerance Test  08/2015    Exercised for 9:01 min --> blunted blood pressure response no EKG changes.  No chest pain.  Low Risk   EYE SURGERY     Cataract surgery   eyelid surgery     INTRAVASCULAR PRESSURE  WIRE/FFR STUDY N/A 11/12/2017   Procedure: INTRAVASCULAR PRESSURE WIRE/FFR STUDY;  Surgeon: Gregory Man, MD;  Location: [facility];  Service: Cardiovascular;  Laterality: LAD -FFR 0.76   LEFT HEART CATH AND CORONARY ANGIOGRAPHY N/A 11/12/2017   Procedure: LEFT HEART CATH AND CORONARY ANGIOGRAPHY;  Surgeon: Gregory Man, MD;  [facility]: [facility]: 70% P-M LAD (FFR 0.76).  40% mid LAD, 50% ostial ramus.  EF 55-60%.  Mildly elevated LVEDP.   SHOULDER OPEN ROTATOR CUFF REPAIR  1983   TRANSTHORACIC ECHOCARDIOGRAM  08/2017   EF 55-60%.  Mild aortic stenosis (mean gradient 11 mmH.)  GR 1 DD.  Mild LA dilation.    Allergies  Allergies  Allergen Reactions   Januvia [Sitagliptin] Other (See Comments)    Gi upset and kidney problems   Lisinopril Cough   Statins Other (See Comments)    Severe leg pain & fatigue -- tried at least 4 (even low dose)    History of Present Illness    [name] presents for ongoing assessment and management of coronary artery disease, with history of PCI and stenting to the LAD on 11/12/2017, hypertension, and hyperlipidemia intolerant of statins on Praluent.  He is here for annual follow-up having been seen last by Dr. Ellyn Mathews on 08/31/2020.  He was scheduled  for a GXT stress test on that office visit, which was completed on 12/12/2020 which was low risk.  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  He is one of 11 children with "over half of them dying from or having heart disease and strokes."  He is very proactive about his health and follow ups. He is medically compliant. Most recent labs were complete in 11/22. LDL was 106.  He did not tolerate Praluent injections due to myalgia pain. He is only on Zetia now. He would like to discuss some alternatives.    He is physically active, walking a mile outside each day and then goes to the [facility] and  walks on the treadmill for 45 minutes along with the machines. He does not have chest discomfort or shortness of breath out of proportion to his exercise.   He also inquires about a urologist recommendation within the [facility] system. His PCP referred him to [facility], but it has been over a month and has not heard from them.    He is also concerned about circulation issues and issues related to memory.  He is asking for medication which may help increase circulation and avoid potential CVA.  Home Medications    Current Outpatient Medications  Medication Sig Dispense Refill   blood glucose meter kit and supplies Use to test blood sugar daily. Dx: E11.9 1 each 0   clopidogrel (PLAVIX) 75 MG tablet TAKE 1 TABLET BY MOUTH EVERY DAY 90 tablet 1   ezetimibe (ZETIA) 10 MG tablet Take 1 tablet (10 mg total) by mouth daily. 90 tablet 3   glipiZIDE (GLUCOTROL) 5 MG tablet Take 1 tablet (5 mg total) by mouth 2 (two) times daily before a meal. 180 tablet 3   Lancets MISC Use to test blood sugar daily. Dx code :E11.9 100 each 3   losartan (COZAAR) 50 MG tablet Take 1 tablet (50 mg total) by mouth daily. 90 tablet 3   metFORMIN (GLUCOPHAGE) 500 MG tablet Take 1 tablet (500 mg total) by mouth 2 (two) times daily with a meal. 180 tablet 3   metoprolol succinate (TOPROL-XL) 25 MG 24 hr tablet Take 1 tablet (25 mg total) by mouth daily. 90 tablet 3   Multiple Vitamin (MULTIVITAMIN) tablet Take 1 tablet by mouth daily.     ONETOUCH ULTRA test strip USE TO TEST BLOOD SUGAR ONCE DAILY 100 strip 11   Current Facility-Administered Medications  Medication Dose Route Frequency Provider Last Rate Last Admin   clotrimazole-betamethasone (LOTRISONE) cream   Topical BID Regal, Tamala Fothergill, DPM       clotrimazole-betamethasone (LOTRISONE) cream   Topical BID Wallene Huh, DPM         Family History    Family History  Problem Relation Age of Onset   Leukemia Mother 79       d. 82   Stroke Father 28       as  a complication of CABG   Colon cancer Father 25   Coronary artery disease Father 17       - Sx was DOE - MV CAD   Peripheral Artery Disease Father        presumptive   Diabetes Sister    Sudden Cardiac Death Sister 74       presumed MI   Heart attack Brother 77  During throat surgery   Congestive Heart Failure Brother        Died @ 25 - ? CHF / ICM   Breast cancer Maternal Grandmother        dx in her 32s   Stroke Maternal Grandfather    Diabetes Brother 16       on lots of meds   Diabetes Brother    Diabetes Paternal Grandmother    Rheumatic fever Brother        childhood RF --> Valvular Dz - valve Sgx,  died young   Stomach cancer Neg Hx    Esophageal cancer Neg Hx    He indicated that his mother is deceased. He indicated that his father is deceased. He indicated that two of his three sisters are alive. He indicated that six of his eight brothers are alive. He indicated that his maternal grandmother is deceased. He indicated that his maternal grandfather is deceased. He indicated that his paternal grandmother is deceased. He indicated that his paternal grandfather is deceased. He indicated that both of his daughters are alive. He indicated that both of his sons are alive. He indicated that his maternal aunt is alive. He indicated that his maternal uncle is deceased. He indicated that his paternal aunt is deceased. He indicated that his paternal uncle is deceased. He indicated that the status of his neg hx is unknown.  Social History    Social History   Socioeconomic History   Marital status: Married    Spouse name: [name]   Number of children: 4   Years of education: 20   Highest education level: Not on file  Occupational History   Occupation: bus Engineer, manufacturing systems: [employer]  Tobacco Use   Smoking status: Former    Packs/day: 1.00    Years: 15.00    Pack years: 15.00    Types: Cigarettes    Quit date: 07/09/1982    Years since  quitting: 39.3   Smokeless tobacco: Never  Substance and Sexual Activity   Alcohol use: No   Drug use: No   Sexual activity: Yes    Partners: Female  Other Topics Concern   Not on file  Social History Narrative   Marital status: married x 69 years; happily married.   Lives: with wife       Children:  4 children; 4 grandchildren. Adult children all live locally.          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      Tobacco:  In past; smoked x 15 years; quit 35 years ago.      Alcohol: none      ADLs: independent with all ADLs.  Does not use assistant device with ambulation.      Living Will:  Has living will; desires FULL CODE; no prolonged measures.      Doctoral degree in Biblical Studies.          Diet: eats twice daily, eggs,sausage,and fruits for breakfast; chicken (fried or broil), occassional pizza, salads, steak and pork chops.        Exercise: cardiac rehab, transition to [facility] - 45 minutes walks and resistance 5x/week   Social Determinants of Health   Financial Resource Strain: Not on file  Food Insecurity: Not on file  Transportation Needs: Not on file  Physical Activity: Not on file  Stress: Not on file  Social Connections: Not on file  Intimate Partner Violence: Not on file     Review of Systems    General:  No chills, fever, night sweats or weight changes.  Cardiovascular:  No chest pain, dyspnea on exertion, edema, orthopnea, palpitations, paroxysmal nocturnal dyspnea. Dermatological: No rash, lesions/masses Respiratory: No cough, dyspnea Urologic: No hematuria, dysuria Abdominal:   No nausea, vomiting, diarrhea, bright red blood per rectum, melena, or hematemesis Neurologic:  No visual changes, wkns, changes in mental status. All other systems reviewed and are otherwise negative except as noted above.    Physical Exam    VS:  BP 134/72    Pulse 63    Ht _0  (1.753 m)    Wt 216 lb  9.6 oz (98.2 kg)    SpO2 96%    BMI 31.99 kg/m  , BMI Body mass index is 31.99 kg/m.     GEN: Well nourished, well developed, in no acute distress. HEENT: normal. Neck: Supple, no JVD positive for bilateral carotid bruits, no masses, or masses or thyromegaly. Cardiac: RRR, 1/6 systolic murmurs, heard best at the left sternal border, radiation to carotid arteries bilaterally rubs, or gallops. No clubbing, cyanosis, edema.  Radials/DP/PT 2+ and equal bilaterally.  Respiratory:  Respirations regular and unlabored, clear to auscultation bilaterally. GI: Soft, nontender, nondistended, BS + x 4. MS: no deformity or atrophy. Skin: warm and dry, no rash.  No wounds or sores on feet bilaterally Neuro:  Strength and sensation are intact. Psych: Normal affect.  Accessory Clinical Findings    ECG personally reviewed by me today-normal sinus rhythm, left axis deviation noted heart rate of 63 bpm.  No acute changes  Lab Results  Component Value Date   WBC 7.7 08/13/2021   HGB 13.3 08/13/2021   HCT 39.0 08/13/2021   MCV 91.2 08/13/2021   PLT 205.0 08/13/2021   Lab Results  Component Value Date   CREATININE 0.92 08/13/2021   BUN 21 08/13/2021   NA 138 08/13/2021   K 4.3 08/13/2021   CL 102 08/13/2021   CO2 28 08/13/2021   Lab Results  Component Value Date   ALT 29 08/13/2021   AST 26 08/13/2021   ALKPHOS 54 08/13/2021   BILITOT 0.7 08/13/2021   Lab Results  Component Value Date   CHOL 175 08/13/2021   HDL 40.20 08/13/2021   LDLCALC 106 (H) 08/13/2021   TRIG 148.0 08/13/2021   CHOLHDL 4 08/13/2021    Lab Results  Component Value Date   HGBA1C 7.0 (H) 08/13/2021    Review of Prior Studies:  GXT stress test: 12/12/2020 Upsloping ST segment depression ST segment depression of 1 mm was noted during stress, and returning to baseline after 1-5 minutes of recovery. Blood pressure demonstrated a normal response to exercise. Negative, adequate stress test.  Echocardiogram:  10/19/2019 1. Left ventricular ejection fraction, by visual estimation, is 60 to  65%. The left ventricle has normal function. There is no left ventricular  hypertrophy.   2. The left ventricle has no regional wall motion abnormalities.   3. Normal GLS -18.3.   4. Global right ventricle has normal systolic function.The right  ventricular size is normal. No increase in right ventricular wall  thickness.   5. Left atrial size was mildly dilated.   6. Right atrial size was normal.   7. The mitral valve is normal in structure. Trivial mitral valve  regurgitation. No evidence of mitral stenosis.   8. The tricuspid valve is normal in structure.   9. The tricuspid  valve is normal in structure. Tricuspid valve  regurgitation is not demonstrated.  10. The aortic valve is tricuspid. Aortic valve regurgitation is not  visualized. Mild aortic valve stenosis.  11. Pulmonic regurgitation is mild.  12. The pulmonic valve was grossly normal. Pulmonic valve regurgitation is  mild.  13. Normal pulmonary artery systolic pressure.  14. The inferior vena cava is normal in size with greater than 50%  respiratory variability, suggesting right atrial pressure of 3 mmHg.   Left Heart Cath 11/12/2017 Prox LAD lesion is 70% stenosed. FFR 0.76: CULPRIT LESION A drug-eluting stent was successfully placed using a STENT SIERRA 2.75 X 18 MM. Post intervention, there is a 0% residual stenosis. Mid LAD lesion is 40% stenosed well beyond the stent. Ost Ramus lesion is 50% stenosed. The left ventricular systolic function is normal. The left ventricular ejection fraction is 55-65% by visual estimate. LV end diastolic pressure is mildly elevated.   As an expected from coronary CTA, FFR positive proximal LAD lesion (although it did not appear angiographically significant, was FFR positive) treated with a DES stent Normal LVEF with mildly elevated EDP.     Plan: Same day discharge after bed rest. DAPT for minimum 6  months, can stop aspirin at 6 months. We will try to start rosuvastatin at 1 tab every other day.  We will need to reassess in follow-up. Discharge on low-dose beta-blocker  Diagnostic Dominance: Right Intervention       Assessment & Plan   1.  Coronary artery disease: He is without complaints at this time, but does report that he did not have any cardiac complaints in the past and his disease was found on a CTA due to strong family history of heart disease.  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  This will be ordered.  Continue current medication regimen and secondary prevention.  2.  Hypercholesterolemia: Most recent labs reveal LDL of 106.  He is intolerant of statins, remains on Zetia, stopped Praluent due to myalgia pain.  As his LDL is not at goal he is given directions on a low cholesterol diet and I will refer to Dr. Debara Pickett for further recommendations concerning management.  The patient is very happy to be seen by Dr. Debara Pickett.  I have advised him to continue his exercise regimen as this is helpful in keeping his cholesterol under control.  His HDL was 40.   3.  Bilateral carotid bruits: Likely result of aortic valve murmur, but it has been approximately 3 years since his carotids were checked.  With strong family history of CVA will recheck carotid ultrasound.   4.  Hypertension: Currently well controlled.  Continue losartan 50 mg daily along with metoprolol.  We will add a low-dose isosorbide mononitrate 15 mg to help with "circulation" although he is not having any cardiac symptoms at this time.  He is worried about brain health and memory loss.  I did suggest OTC medications but he preferred to have a prescription.  I did warn him that he may notice a drop in his blood pressure if this is the case he needs to call us so we can adjust medications, and also that it may cause a low-grade headache.  If he does not tolerate it he has to stop.  I  am using isosorbide cautiously.  He did request Viagra, but this is a as needed medication and I have explained that to him.  5.  Need for referral to urology: He states he needs a circumcision. The patient had been referred to [facility], but it has been over a month and he has not heard from them.  I have given him a list of urologists within the [facility] system, as he is requesting that the urologist be in the same health system.  He will discuss this with his PCP for a different referral so that he can use MyChart.  6.  Non-insulin-dependent diabetes: Remains on metformin and followed by PCP.  Current medicines are reviewed at length with the patient today.  I have spent 40 minutes  dedicated to the care of this patient on the date of this encounter to include pre-visit review of records, assessment, management and diagnostic testing,with shared decision making.   Signed, Phill Myron. West Pugh, ANP, AACC   10/19/2021 8:03 AM    [facility] [place] [address] Office [phone] Fax [phone]  Notice: This dictation was prepared with Dragon dictation along with smaller phrase technology. Any transcriptional errors that result from this process are unintentional and may not be corrected upon review.

## 2021-10-19 ENCOUNTER — Ambulatory Visit (INDEPENDENT_AMBULATORY_CARE_PROVIDER_SITE_OTHER): Payer: Medicare Other | Admitting: Adult Health

## 2021-10-19 ENCOUNTER — Encounter: Payer: Self-pay | Admitting: Adult Health

## 2021-10-19 ENCOUNTER — Other Ambulatory Visit: Payer: Self-pay

## 2021-10-19 ENCOUNTER — Ambulatory Visit (HOSPITAL_COMMUNITY)
Admission: RE | Admit: 2021-10-19 | Discharge: 2021-10-19 | Disposition: A | Discharge status: Home / Self Care | Payer: Medicare Other | Source: Ambulatory Visit | Attending: Adult Health | Admitting: Adult Health

## 2021-10-19 VITALS — BP 134/72 | HR 63 | Ht 69.0 in | Wt 216.6 lb

## 2021-10-19 DIAGNOSIS — I10 Essential (primary) hypertension: Secondary | ICD-10-CM | POA: Diagnosis not present

## 2021-10-19 DIAGNOSIS — I350 Nonrheumatic aortic (valve) stenosis: Secondary | ICD-10-CM

## 2021-10-19 DIAGNOSIS — R0989 Other specified symptoms and signs involving the circulatory and respiratory systems: Secondary | ICD-10-CM | POA: Diagnosis not present

## 2021-10-19 DIAGNOSIS — I25119 Atherosclerotic heart disease of native coronary artery with unspecified angina pectoris: Secondary | ICD-10-CM

## 2021-10-19 DIAGNOSIS — E1165 Type 2 diabetes mellitus with hyperglycemia: Secondary | ICD-10-CM

## 2021-10-19 DIAGNOSIS — Z789 Other specified health status: Secondary | ICD-10-CM

## 2021-10-19 DIAGNOSIS — E7800 Pure hypercholesterolemia, unspecified: Secondary | ICD-10-CM | POA: Diagnosis not present

## 2021-10-19 MED ORDER — ISOSORBIDE MONONITRATE ER 30 MG PO TB24: 15.0000 mg | ORAL_TABLET | Freq: Every day | ORAL | 1 refills | Status: DC

## 2021-10-19 NOTE — Addendum Note (Signed)
Addended by: Merri Ray A on: 10/19/2021 11:35 AM   Modules accepted: Orders

## 2021-10-19 NOTE — Addendum Note (Signed)
Addended by: Lendon Colonel on: 10/19/2021 11:37 AM   Modules accepted: Orders

## 2021-10-19 NOTE — Patient Instructions (Signed)
Medication Instructions:  Start Isosorbide Mononitrate 30 mg ( Take 0.5 Tablet Daily 15mg  ). *If you need a refill on your cardiac medications before your next appointment, please call your pharmacy*   Lab Work: No Labs If you have labs (blood work) drawn today and your tests are completely normal, you will receive your results only by: [place] (if you have MyChart) OR A paper copy in the mail If you have any lab test that is abnormal or we need to change your treatment, we will call you to review the results.   Testing/Procedures: [place], [address]. Your physician has requested that you have a lexiscan myoview. For further information please visit [email or web address]. Please follow instruction sheet, as given.   [place] has requested that you have a carotid duplex. This test is an ultrasound of the carotid arteries in your neck. It looks at blood flow through these arteries that supply the brain with blood. Allow one hour for this exam. There are no restrictions or special instructions.   Follow-Up: At [facility], you and your health needs are our priority.  As part of our continuing mission to provide you with exceptional heart care, we have created designated Provider Care Teams.  These Care Teams include your primary Cardiologist (physician) and Advanced Practice Providers (APPs -  Physician Assistants and Nurse Practitioners) who all work together to provide you with the care you need, when you need it.  We recommend signing up for the patient portal called "MyChart".  Sign up information is provided on this After Visit Summary.  MyChart is used to connect with patients for Virtual Visits (Telemedicine).  Patients are able to view lab/test results, encounter notes, upcoming appointments, etc.  Non-urgent messages can be sent to your provider as well.   To learn more about what you can do with MyChart, go to  [email or web address].    Your next appointment:   1 year(s)  The format for your next appointment:   In Person  Provider:   Glenetta Hew, MD

## 2021-10-23 ENCOUNTER — Encounter: Payer: Self-pay | Admitting: Cardiology

## 2021-10-23 NOTE — Telephone Encounter (Signed)
error 

## 2021-10-25 ENCOUNTER — Telehealth (HOSPITAL_COMMUNITY): Payer: Self-pay | Admitting: *Deleted

## 2021-10-25 NOTE — Telephone Encounter (Signed)
Close encounter 

## 2021-10-26 ENCOUNTER — Ambulatory Visit (HOSPITAL_COMMUNITY)
Admission: RE | Admit: 2021-10-26 | Discharge: 2021-10-26 | Disposition: A | Discharge status: Home / Self Care | Payer: Medicare Other | Source: Ambulatory Visit | Attending: Cardiology | Admitting: Cardiology

## 2021-10-26 ENCOUNTER — Other Ambulatory Visit: Payer: Self-pay

## 2021-10-26 DIAGNOSIS — I25119 Atherosclerotic heart disease of native coronary artery with unspecified angina pectoris: Secondary | ICD-10-CM | POA: Insufficient documentation

## 2021-10-26 LAB — MYOCARDIAL PERFUSION IMAGING
Angina Index: 0
Base ST Depression (mm): 0 mm
Duke Treadmill Score: 9
Estimated workload: 10.1
Exercise duration (min): 9 min
Exercise duration (sec): 0 s
LV dias vol: 114 mL (ref 62–150)
LV sys vol: 51 mL
MPHR: 147 {beats}/min
Nuc Stress EF: 55 %
Peak HR: 134 {beats}/min
Percent HR: 91 %
Rest HR: 56 {beats}/min
Rest Nuclear Isotope Dose: 10.9 mCi
SDS: 0
SRS: 1
SSS: 1
ST Depression (mm): 0 mm
Stress Nuclear Isotope Dose: 32.9 mCi
TID: 0.94

## 2021-10-26 MED ORDER — TECHNETIUM TC 99M TETROFOSMIN IV KIT
32.9000 | PACK | Freq: Once | INTRAVENOUS | Status: AC | PRN
  Administered 2021-10-26: 32.9 via INTRAVENOUS
  Filled 2021-10-26: qty 33

## 2021-10-26 MED ORDER — TECHNETIUM TC 99M TETROFOSMIN IV KIT
10.9000 | PACK | Freq: Once | INTRAVENOUS | Status: AC | PRN
  Administered 2021-10-26: 10.9 via INTRAVENOUS
  Filled 2021-10-26: qty 11

## 2021-10-29 ENCOUNTER — Telehealth: Payer: Self-pay

## 2021-10-29 NOTE — Telephone Encounter (Addendum)
Called patient regarding results. Left message.----- Message from Lendon Colonel, NP sent at 10/26/2021  4:54 PM EST ----- Stress test is low risk, good report. No changes in his medication regimen. Continue to exercise as he has been doing.

## 2021-10-30 ENCOUNTER — Telehealth: Payer: Self-pay

## 2021-10-30 NOTE — Telephone Encounter (Addendum)
Called patient regarding results. Patient had understanding of results.----- Message from Lendon Colonel, NP sent at 10/26/2021  4:54 PM EST ----- Stress test is low risk, good report. No changes in his medication regimen. Continue to exercise as he has been doing.

## 2021-10-31 ENCOUNTER — Other Ambulatory Visit: Payer: Self-pay | Admitting: Podiatry

## 2021-10-31 ENCOUNTER — Telehealth: Payer: Self-pay

## 2021-10-31 DIAGNOSIS — L309 Dermatitis, unspecified: Secondary | ICD-10-CM

## 2021-10-31 MED ORDER — CLOTRIMAZOLE-BETAMETHASONE 1-0.05 % EX CREA: TOPICAL_CREAM | Freq: Two times a day (BID) | CUTANEOUS | Status: AC

## 2021-10-31 NOTE — Telephone Encounter (Signed)
I tried to send in again. I have tried a number of times

## 2021-10-31 NOTE — Telephone Encounter (Signed)
Patient called the office stating medication clotrimazole-betamethasone (LOTRISONE) cream was not sent to the pharmacy.   Please advise

## 2021-11-01 NOTE — Telephone Encounter (Signed)
I called the patient this morning to inform him , unfortunately the call didn't go through , will try again at a later time

## 2021-11-02 ENCOUNTER — Telehealth: Payer: Self-pay

## 2021-11-02 ENCOUNTER — Encounter: Payer: Self-pay | Admitting: Adult Health

## 2021-11-02 NOTE — Telephone Encounter (Addendum)
Called patient regarding results. Patient had understanding of results. Patient requested DOT letter. Advised Jory Sims and mailed out list of urologist in the area per patient request.---- Message from Lendon Colonel, NP sent at 10/26/2021  4:54 PM EST ----- Stress test is low risk, good report. No changes in his medication regimen. Continue to exercise as he has been doing.

## 2021-11-02 NOTE — Telephone Encounter (Signed)
Called patient to advise DOT Letter ready for pick up at [facility] [employer].

## 2021-11-19 ENCOUNTER — Ambulatory Visit: Payer: Medicare Other | Admitting: Emergency Medicine

## 2021-11-21 DIAGNOSIS — H26491 Other secondary cataract, right eye: Secondary | ICD-10-CM | POA: Diagnosis not present

## 2021-11-21 DIAGNOSIS — H524 Presbyopia: Secondary | ICD-10-CM | POA: Diagnosis not present

## 2021-11-21 DIAGNOSIS — H04123 Dry eye syndrome of bilateral lacrimal glands: Secondary | ICD-10-CM | POA: Diagnosis not present

## 2021-11-21 DIAGNOSIS — E113291 Type 2 diabetes mellitus with mild nonproliferative diabetic retinopathy without macular edema, right eye: Secondary | ICD-10-CM | POA: Diagnosis not present

## 2021-11-21 LAB — HM DIABETES EYE EXAM

## 2021-11-26 DIAGNOSIS — R3914 Feeling of incomplete bladder emptying: Secondary | ICD-10-CM | POA: Diagnosis not present

## 2021-11-26 DIAGNOSIS — N481 Balanitis: Secondary | ICD-10-CM | POA: Diagnosis not present

## 2021-12-19 ENCOUNTER — Encounter: Payer: Self-pay | Admitting: Internal Medicine

## 2021-12-27 NOTE — Telephone Encounter (Signed)
Error message

## 2022-01-06 ENCOUNTER — Other Ambulatory Visit: Payer: Self-pay | Admitting: Emergency Medicine

## 2022-01-06 DIAGNOSIS — I2510 Atherosclerotic heart disease of native coronary artery without angina pectoris: Secondary | ICD-10-CM

## 2022-01-13 ENCOUNTER — Ambulatory Visit (HOSPITAL_COMMUNITY)
Admission: EM | Admit: 2022-01-13 | Discharge: 2022-01-13 | Disposition: A | Discharge status: Home / Self Care | Payer: Medicare Other | Attending: Nurse Practitioner | Admitting: Nurse Practitioner

## 2022-01-13 ENCOUNTER — Other Ambulatory Visit: Payer: Self-pay | Admitting: Adult Health

## 2022-01-13 DIAGNOSIS — R059 Cough, unspecified: Secondary | ICD-10-CM | POA: Insufficient documentation

## 2022-01-13 DIAGNOSIS — R0982 Postnasal drip: Secondary | ICD-10-CM | POA: Diagnosis not present

## 2022-01-13 DIAGNOSIS — J029 Acute pharyngitis, unspecified: Secondary | ICD-10-CM | POA: Diagnosis not present

## 2022-01-13 DIAGNOSIS — J069 Acute upper respiratory infection, unspecified: Secondary | ICD-10-CM | POA: Diagnosis not present

## 2022-01-13 DIAGNOSIS — Z20822 Contact with and (suspected) exposure to covid-19: Secondary | ICD-10-CM | POA: Insufficient documentation

## 2022-01-13 LAB — POCT RAPID STREP A, ED / UC: Streptococcus, Group A Screen (Direct): NEGATIVE

## 2022-01-13 MED ORDER — CHLORASEPTIC GARGLE 1.4 % MT LIQD: 1.0000 | OROMUCOSAL | 0 refills | Status: DC | PRN

## 2022-01-13 NOTE — ED Triage Notes (Signed)
C/o sore throat, sinus pressure and cough that started today. ?

## 2022-01-13 NOTE — ED Provider Notes (Signed)
?Julian ? ? ? ?CSN: [id number] ?Arrival date & time: 01/13/22  1339 ? ? ?  ? ?History   ?Chief Complaint ?Chief Complaint  ?Patient presents with  ? Headache  ? Cough  ? ? ?HPI ?Gregory Mathews. is a 76 y.o. male.  ? ?Patient presents for cough, sore throat, sinus pain that started yesterday.  He denies fevers, body aches/chills, wheezing, chest tightness, chest pain, chest congestion.  He does have some nasal congestion and runny nose as well as postnasal drip and headache.  Denies any tooth pain, ear pain or pressure, sneezing, abdominal pain, nausea/vomiting, diarrhea, and change in appetite.  He denies any new rash on skin.  He reports whenever he gets a cold, " it goes straight to my sinuses."  He is also wondering if he may have strep throat.  He is requesting a pill " to knock it out."  He has taken Mucinex, Tylenol, and cough syrup without much help. ? ? ? ? ? ?Past Medical History:  ?Diagnosis Date  ? Allergy   ? Basal cell carcinoma 01/2013  ? Nasal and R forearm.  [facility]  ? CAD S/P percutaneous coronary angioplasty   ? 2/19 PCI/DESx1 to mLAD, FFR 0.7 -[place] DES 2.75 mm x 18 mm  ? Cataract   ? Diabetes mellitus without complication ([employer])   ? on PO Meds  ? Essential hypertension   ? Family history of colon cancer   ? Family history of malignant neoplasm of breast   ? Heart murmur   ? Hyperlipidemia   ? statin intolerant -- myalgias, fatigue (tried at least 4)  ? Polyposis of colon   ? Rosacea   ? ? ?Patient Active Problem List  ? Diagnosis Date Noted  ? Atherosclerosis of aorta ([place]) 02/15/2021  ? Genetic testing 01/01/2021  ? Family history of colon cancer   ? Family history of malignant neoplasm of breast   ? Polyposis of colon   ? 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 Clotting disorder ([place]) 05/17/2020  ? Skin cancer, basal cell 11/18/2019  ? Uncircumcised male 03/11/2018  ? Coronary artery disease involving native coronary artery of native heart with angina pectoris ([place])  11/04/2017  ? Abnormal findings on diagnostic imaging of cardiovascular system 11/04/2017  ? [place] coronary artery calcium score between 100 and 199 09/22/2017  ? Family history of early CAD 09/11/2017  ? Mild aortic stenosis by prior echocardiogram 09/11/2017  ? Metabolic syndrome 35/59/7416  ? Hypogonadism in male 08/14/2016  ? BMI 31.0-31.9,adult 03/13/2016  ? Hypertension associated with diabetes ([place]) 08/29/2015  ? Diverticulosis of colon without hemorrhage 08/01/2015  ? Statin intolerance 08/01/2015  ? Basal cell carcinoma of nose 08/23/2014  ? Rosacea 08/23/2014  ? Seasonal allergies 01/26/2013  ? Type 2 diabetes mellitus with hyperglycemia, without long-term current use of insulin ([place]) 07/09/2012  ? Hyperlipidemia associated with type 2 diabetes mellitus ([place]) 07/09/2012  ? Erectile dysfunction 07/09/2012  ? ? ?Past Surgical History:  ?Procedure Laterality Date  ? basal cell removal    ? nose and wrist   ? BELPHAROPTOSIS REPAIR    ? CORONARY CT ANGIOGRAM  09/2017  ? Coronary Calcium Score: 111. Coronary calcification noted in the LEFT MAIN (LM) and prox LAD.  LM < 50% calcified stenosis.  Mid LAD 50-75% plaque noted CT FFR --> positive at 0.80.  Recommend CARDIAC CATHETERIZATION  ? CORONARY STENT INTERVENTION N/A 11/12/2017  ? Procedure: CORONARY STENT INTERVENTION;  Surgeon: Leonie Man, MD;  Location: [facility];  Service: Cardiovascular:  DES PCI:  STENT [place] 2.75 X 18 MM - Post intervention, there is a 0% residual stenosis.  ? ETT/GXT: Graded Exercise Tolerance Test  08/2015  ?  Exercised for 9:01 min --> blunted blood pressure response no EKG changes.  No chest pain.  Low Risk  ? EYE SURGERY    ? Cataract surgery  ? eyelid surgery    ? INTRAVASCULAR PRESSURE WIRE/FFR STUDY N/A 11/12/2017  ? Procedure: INTRAVASCULAR PRESSURE WIRE/FFR STUDY;  Surgeon: Leonie Man, MD;  Location: [facility];  Service: Cardiovascular;  Laterality: LAD -FFR 0.76  ? LEFT HEART CATH AND CORONARY  ANGIOGRAPHY N/A 11/12/2017  ? Procedure: LEFT HEART CATH AND CORONARY ANGIOGRAPHY;  Surgeon: Leonie Man, MD;  Location: [facility] IN[facility]: 70% P-M LAD (FFR 0.76).  40% mid LAD, 50% ostial ramus.  EF 55-60%.  Mildly elevated LVEDP.  ? Drain  ? TRANSTHORACIC ECHOCARDIOGRAM  08/2017  ? EF 55-60%.  Mild aortic stenosis (mean gradient 11 mmH.)  GR 1 DD.  Mild LA dilation.  ? ? ? ? ? ?Home Medications   ? ?Prior to Admission medications   ?Medication Sig Start Date End Date Taking? Authorizing Provider  ?phenol (CHLORASEPTIC GARGLE) 1.4 % LIQD Use as directed 1 spray in the mouth or throat as needed for throat irritation / pain. 01/13/22  Yes Noemi Chapel A, NP  ?blood glucose meter kit and supplies Use to test blood sugar daily. Dx: E11.9 10/11/15   Tereasa Coop, PA-C  ?clopidogrel (PLAVIX) 75 MG tablet TAKE 1 TABLET BY MOUTH EVERY DAY 07/30/21   Horald Pollen, MD  ?ezetimibe (ZETIA) 10 MG tablet Take 1 tablet (10 mg total) by mouth daily. 02/15/21   Horald Pollen, MD  ?glipiZIDE (GLUCOTROL) 5 MG tablet Take 1 tablet (5 mg total) by mouth 2 (two) times daily before a meal. 02/15/21 10/19/21  Sagardia, Ines Bloomer, MD  ?isosorbide mononitrate (IMDUR) 30 MG 24 hr tablet Take 0.5 tablets (15 mg total) by mouth daily. 10/19/21 01/17/22  Lendon Colonel, NP  ?Lancets MISC Use to test blood sugar daily. Dx code :E11.9 07/08/19   Horald Pollen, MD  ?losartan (COZAAR) 50 MG tablet Take 1 tablet (50 mg total) by mouth daily. 02/15/21 10/19/21  Horald Pollen, MD  ?metFORMIN (GLUCOPHAGE) 500 MG tablet Take 1 tablet (500 mg total) by mouth 2 (two) times daily with a meal. 02/15/21   Sagardia, Ines Bloomer, MD  ?metoprolol succinate (TOPROL-XL) 25 MG 24 hr tablet TAKE 1 TABLET (25 MG TOTAL) BY MOUTH DAILY. 01/06/22   Horald Pollen, MD  ?Multiple Vitamin (MULTIVITAMIN) tablet Take 1 tablet by mouth daily.    [provider]  ?Donald Siva test  strip USE TO TEST BLOOD SUGAR ONCE DAILY 10/04/21   Horald Pollen, MD  ? ? ?Family History ?Family History  ?Problem Relation Age of Onset  ? Leukemia Mother 62  ?     d. 69  ? Stroke Father 36  ?     as a complication of CABG  ? Colon cancer Father 71  ? Coronary artery disease Father 24  ?     - Sx was DOE - MV CAD  ? Peripheral Artery Disease Father   ?     presumptive  ? Diabetes Sister   ? Sudden Cardiac Death Sister 53  ?  presumed MI  ? Heart attack Brother 61  ?     During throat surgery  ? Congestive Heart Failure Brother   ?     Died @ 35 - ? CHF / ICM  ? Breast cancer Maternal Grandmother   ?     dx in her 74s  ? Stroke Maternal Grandfather   ? Diabetes Brother 22  ?     on lots of meds  ? Diabetes Brother   ? Diabetes Paternal Grandmother   ? Rheumatic fever Brother   ?     childhood RF --> Valvular Dz - valve Sgx,  died young  ? Stomach cancer Neg Hx   ? Esophageal cancer Neg Hx   ? ? ?Social History ?Social History  ? ?Tobacco Use  ? Smoking status: Former  ?  Packs/day: 1.00  ?  Years: 15.00  ?  Pack years: 15.00  ?  Types: Cigarettes  ?  Quit date: 07/09/1982  ?  Years since quitting: 39.5  ? Smokeless tobacco: Never  ?Substance Use Topics  ? Alcohol use: No  ? Drug use: No  ? ? ? ?Allergies   ?Januvia [sitagliptin], Lisinopril, and Statins ? ? ?Review of Systems ?Review of Systems ?Per HPI ? ?Physical Exam ?Triage Vital Signs ?ED Triage Vitals [01/13/22 1454]  ?Enc Vitals Group  ?   BP (!) 142/64  ?   Pulse Rate 85  ?   Resp 16  ?   Temp 98.9 ?F (37.2 ?C)  ?   Temp Source Oral  ?   SpO2 100 %  ?   Weight   ?   Height   ?   Head Circumference   ?   Peak Flow   ?   Pain Score 0  ?   Pain Loc   ?   Pain Edu?   ?   Excl. in [place]?   ? ?No data found. ? ?Updated Vital Signs ?BP (!) 142/64 (BP Location: Left Arm)   Pulse 85   Temp 98.9 ?F (37.2 ?C) (Oral)   Resp 16   SpO2 100%  ? ?Visual Acuity ?Right Eye Distance:   ?Left Eye Distance:   ?Bilateral Distance:   ? ?Right Eye Near:   ?Left Eye  Near:    ?Bilateral Near:    ? ?Physical Exam ?Vitals and nursing note reviewed.  ?Constitutional:   ?   General: He is not in acute distress. ?   Appearance: Normal appearance. He is not ill-appearing or toxic-appe

## 2022-01-13 NOTE — Discharge Instructions (Signed)
Your symptoms and exam findings are most consistent with a viral upper respiratory infection. These usually run their course in about 7-10 days.  If your symptoms last longer than 10 days without improvement, please follow up with your primary care provider.  If your symptoms, worsen, please go to the Emergency Room.   ? ?We have tested you today for strep throat and COVID.  You will see the results in Mychart and we will call you with positive results.    Please stay home and isolate until you are aware of the results.   ? ?Some things that can make you feel better are: ?- Increased rest ?- Increasing fluid with water/sugar free electrolytes ?- Acetaminophen as needed for fever/pain.  ?- Salt water gargling, chloraseptic spray and throat lozenges ?- OTC guaifenesin (Mucinex).  ?- Saline sinus flushes or a neti pot.  ?- Humidifying the air. ? ?

## 2022-01-14 ENCOUNTER — Telehealth (HOSPITAL_COMMUNITY): Payer: Self-pay | Admitting: Emergency Medicine

## 2022-01-14 LAB — SARS CORONAVIRUS 2 (TAT 6-24 HRS): SARS Coronavirus 2: NEGATIVE

## 2022-01-14 NOTE — Telephone Encounter (Signed)
Pt called and stated that the chloraseptic spray that was sent to the pharmacy yesterday is not giving any relief. Pt would like to know if anything else can be sent in? Please advise thank you  ?

## 2022-01-16 LAB — CULTURE, GROUP A STREP (THRC)

## 2022-01-24 DIAGNOSIS — N471 Phimosis: Secondary | ICD-10-CM | POA: Diagnosis not present

## 2022-01-24 DIAGNOSIS — N481 Balanitis: Secondary | ICD-10-CM | POA: Diagnosis not present

## 2022-01-24 DIAGNOSIS — R3914 Feeling of incomplete bladder emptying: Secondary | ICD-10-CM | POA: Diagnosis not present

## 2022-01-25 ENCOUNTER — Other Ambulatory Visit: Payer: Self-pay | Admitting: Emergency Medicine

## 2022-01-25 DIAGNOSIS — Z955 Presence of coronary angioplasty implant and graft: Secondary | ICD-10-CM

## 2022-02-12 ENCOUNTER — Ambulatory Visit: Payer: Medicare Other | Admitting: Emergency Medicine

## 2022-02-13 ENCOUNTER — Ambulatory Visit: Payer: Medicare Other | Admitting: Emergency Medicine

## 2022-02-21 ENCOUNTER — Other Ambulatory Visit: Payer: Self-pay | Admitting: Emergency Medicine

## 2022-02-21 DIAGNOSIS — E1169 Type 2 diabetes mellitus with other specified complication: Secondary | ICD-10-CM

## 2022-02-28 ENCOUNTER — Encounter: Payer: Self-pay | Admitting: Pharmacist

## 2022-02-28 DIAGNOSIS — G720 Drug-induced myopathy: Secondary | ICD-10-CM

## 2022-02-28 NOTE — Progress Notes (Signed)
Gregory Mathews)                                            Gregory Mathews                                        Statin Quality Measure Assessment    02/28/2022  Gregory Mathews. 11-21-1945 [id number]  Per review of chart and payor information, patient has a diagnosis of diabetes and cardiovascular disease but is not currently filling a statin prescription.  This places patient into the SUPD (Statin Use In Patients with Diabetes) and SPC (Statin Use in Patients with Cardiovascular Disease) measures for CMS.    Gregory Mathews has documented intolerance to statin medications, patient has been coded for statin exclusion in the past.      Component Value Date/Time   CHOL 175 08/13/2021 0847   CHOL 86 (L) 11/16/2020 0857   TRIG 148.0 08/13/2021 0847   HDL 40.20 08/13/2021 0847   HDL 41 11/16/2020 0857   CHOLHDL 4 08/13/2021 0847   VLDL 29.6 08/13/2021 0847   LDLCALC 106 (H) 08/13/2021 0847   LDLCALC 24 11/16/2020 0857    Please consider ONE of the following recommendations:  Code for past statin intolerance  (required annually)  Provider Requirements: Must associate code during an office visit or telehealth encounter   Drug Induced Myopathy G72.0   Myositis, unspecified M60.9   Myopathy, unspecified G72.9   Rhabdomyolysis  M62.82    Loretha Brasil, PharmD [place] Pharmacist Office: [phone]

## 2022-03-01 ENCOUNTER — Ambulatory Visit: Payer: Medicare Other

## 2022-03-01 ENCOUNTER — Telehealth: Payer: Self-pay

## 2022-03-01 NOTE — Telephone Encounter (Signed)
Left voice message for patient regarding his AWV-S scheduled for 03/01/2022.  According to this patient's chart his last AWV-I was done on 06/10/2021 and will not be covered for today's visit.  Appt was cancelled and will have secretary reschedule for after 06/10/2022.

## 2022-03-04 ENCOUNTER — Ambulatory Visit (INDEPENDENT_AMBULATORY_CARE_PROVIDER_SITE_OTHER): Payer: Medicare Other | Admitting: Emergency Medicine

## 2022-03-04 ENCOUNTER — Encounter: Payer: Self-pay | Admitting: Emergency Medicine

## 2022-03-04 VITALS — BP 138/64 | HR 62 | Temp 97.9°F | Ht 69.0 in | Wt 211.0 lb

## 2022-03-04 DIAGNOSIS — E1159 Type 2 diabetes mellitus with other circulatory complications: Secondary | ICD-10-CM | POA: Diagnosis not present

## 2022-03-04 DIAGNOSIS — E1169 Type 2 diabetes mellitus with other specified complication: Secondary | ICD-10-CM

## 2022-03-04 DIAGNOSIS — I25119 Atherosclerotic heart disease of native coronary artery with unspecified angina pectoris: Secondary | ICD-10-CM | POA: Diagnosis not present

## 2022-03-04 DIAGNOSIS — E785 Hyperlipidemia, unspecified: Secondary | ICD-10-CM

## 2022-03-04 DIAGNOSIS — E1165 Type 2 diabetes mellitus with hyperglycemia: Secondary | ICD-10-CM | POA: Diagnosis not present

## 2022-03-04 DIAGNOSIS — I152 Hypertension secondary to endocrine disorders: Secondary | ICD-10-CM

## 2022-03-04 DIAGNOSIS — I10 Essential (primary) hypertension: Secondary | ICD-10-CM

## 2022-03-04 DIAGNOSIS — I350 Nonrheumatic aortic (valve) stenosis: Secondary | ICD-10-CM

## 2022-03-04 LAB — MICROALBUMIN / CREATININE URINE RATIO
Creatinine,U: 70.5 mg/dL
Microalb Creat Ratio: 1 mg/g (ref 0.0–30.0)
Microalb, Ur: <0.7 mg/dL (ref 0.0–1.9)

## 2022-03-04 LAB — COMPREHENSIVE METABOLIC PANEL WITH GFR
ALT: 22 U/L (ref 0–53)
AST: 19 U/L (ref 0–37)
Albumin: 4.4 g/dL (ref 3.5–5.2)
Alkaline Phosphatase: 57 U/L (ref 39–117)
BUN: 17 mg/dL (ref 6–23)
CO2: 29 meq/L (ref 19–32)
Calcium: 10.2 mg/dL (ref 8.4–10.5)
Chloride: 104 meq/L (ref 96–112)
Creatinine, Ser: 0.85 mg/dL (ref 0.40–1.50)
GFR: 85.82 mL/min
Glucose, Bld: 100 mg/dL — ABNORMAL HIGH (ref 70–99)
Potassium: 4.7 meq/L (ref 3.5–5.1)
Sodium: 141 meq/L (ref 135–145)
Total Bilirubin: 0.6 mg/dL (ref 0.2–1.2)
Total Protein: 7.3 g/dL (ref 6.0–8.3)

## 2022-03-04 LAB — LIPID PANEL
Cholesterol: 152 mg/dL (ref 0–200)
HDL: 39.3 mg/dL (ref >39.00)
LDL Cholesterol: 83 mg/dL (ref 0–99)
NonHDL: 113.06
Total CHOL/HDL Ratio: 4
Triglycerides: 151 mg/dL — ABNORMAL HIGH (ref 0.0–149.0)
VLDL: 30.2 mg/dL (ref 0.0–40.0)

## 2022-03-04 LAB — POCT GLYCOSYLATED HEMOGLOBIN (HGB A1C): Hemoglobin A1C: 6.6 % — AB (ref 4.0–5.6)

## 2022-03-04 MED ORDER — LOSARTAN POTASSIUM 50 MG PO TABS: 50.0000 mg | ORAL_TABLET | Freq: Every day | ORAL | 3 refills | Status: DC

## 2022-03-04 MED ORDER — GLIPIZIDE 5 MG PO TABS: 5.0000 mg | ORAL_TABLET | Freq: Two times a day (BID) | ORAL | 3 refills | Status: DC

## 2022-03-04 NOTE — Patient Instructions (Signed)

## 2022-03-04 NOTE — Assessment & Plan Note (Signed)
Well-controlled hypertension.  Continue metoprolol succinate 25 mg daily and losartan 50 mg daily. BP Readings from Last 3 Encounters:  03/04/22 138/64  01/13/22 (!) 142/64  10/19/21 134/72  Well-controlled diabetes with hemoglobin A1c better than before at 6.6. Patient only taking glipizide 5 mg twice a day.  Not taking metformin due to GI side effects. Diet and nutrition discussed. Cardiovascular risks associated with diabetes and hypertension discussed. Follow-up in 6 months.

## 2022-03-04 NOTE — Assessment & Plan Note (Signed)
Stable.  No anginal episodes. Denies dyspnea on exertion or chest pain on exertion. Continue metoprolol succinate 25 mg daily. Continue Plavix 75 mg daily.

## 2022-03-04 NOTE — Assessment & Plan Note (Signed)
Stable.  Diet and nutrition discussed. Continue Zetia 10 mg daily. Intolerant to statins.

## 2022-03-04 NOTE — Assessment & Plan Note (Signed)
Stable and asymptomatic.  Murmur on exam.

## 2022-03-04 NOTE — Progress Notes (Signed)
Gregory Mathews. 76 y.o.   Chief Complaint  Patient presents with   Follow-up    6 months f/u appt, questions about  Metoprolol and his cholesterol     HISTORY OF PRESENT ILLNESS: This is a 76 y.o. male here for 28-monthfollow-up of diabetes, hypertension, coronary artery disease, and dyslipidemia.  Overall doing well. Question about metoprolol and cholesterol levels. No other complaints or medical concerns today.  HPI   Prior to Admission medications   Medication Sig Start Date End Date Taking? Authorizing Provider  blood glucose meter kit and supplies Use to test blood sugar daily. Dx: E11.9 10/11/15  Yes CTereasa Coop PA-C  clopidogrel (PLAVIX) 75 MG tablet Take 1 tablet (75 mg total) by mouth daily. Annual appt due in Nov must see provider for future refills 01/25/22  Yes Eliese Kerwood, MInes Bloomer MD  ezetimibe (ZETIA) 10 MG tablet TAKE 1 TABLET BY MOUTH EVERY DAY 02/21/22  Yes SHorald Pollen MD  Lancets MISC Use to test blood sugar daily. Dx code :E11.9 07/08/19  Yes Gala Padovano, MInes Bloomer MD  metoprolol succinate (TOPROL-XL) 25 MG 24 hr tablet TAKE 1 TABLET (25 MG TOTAL) BY MOUTH DAILY. 01/06/22  Yes Conn Trombetta, MInes Bloomer MD  Multiple Vitamin (MULTIVITAMIN) tablet Take 1 tablet by mouth daily.   Yes [provider]  [facility] test strip USE TO TEST BLOOD SUGAR ONCE DAILY 10/04/21  Yes La Shehan, MInes Bloomer MD  glipiZIDE (GLUCOTROL) 5 MG tablet Take 1 tablet (5 mg total) by mouth 2 (two) times daily before a meal. 03/04/22 02/27/23  SHorald Pollen MD  isosorbide mononitrate (IMDUR) 30 MG 24 hr tablet TAKE 1/2 OF A TABLET (15 MG TOTAL) BY MOUTH DAILY Patient not taking: Reported on 03/04/2022 01/14/22   HLeonie Man MD  losartan (COZAAR) 50 MG tablet Take 1 tablet (50 mg total) by mouth daily. 03/04/22 02/27/23  SHorald Pollen MD  metFORMIN (GLUCOPHAGE) 500 MG tablet Take 1 tablet (500 mg total) by mouth 2 (two) times daily with a meal. Patient not  taking: Reported on 03/04/2022 02/15/21   SHorald Pollen MD  phenol (CHLORASEPTIC GARGLE) 1.4 % LIQD Use as directed 1 spray in the mouth or throat as needed for throat irritation / pain. Patient not taking: Reported on 03/04/2022 01/13/22   MEulogio Bear NP    Allergies  Allergen Reactions   Januvia [Sitagliptin] Other (See Comments)    Gi upset and kidney problems   Lisinopril Cough   Statins Other (See Comments)    Severe leg pain & fatigue -- tried at least 4 (even low dose)    Patient Active Problem List   Diagnosis Date Noted   Atherosclerosis of aorta ([place] 02/15/2021   Genetic testing 01/01/2021   Family history of colon cancer    Family history of malignant neoplasm of breast    Polyposis of colon    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   Clotting disorder ([place] 05/17/2020   Skin cancer, basal cell 11/18/2019   Uncircumcised male 03/11/2018   Coronary artery disease involving native coronary artery of native heart with angina pectoris ([place] 11/04/2017   Abnormal findings on diagnostic imaging of cardiovascular system 11/04/2017   Agatston coronary artery calcium score between 100 and 199 09/22/2017   Family history of early CAD 09/11/2017   Mild aortic stenosis by prior echocardiogram 134/74/2595  Metabolic syndrome 163/87/5643  Hypogonadism in male 08/14/2016   BMI 31.0-31.9,adult 03/13/2016   Hypertension associated with diabetes (  [place]) 08/29/2015   Diverticulosis of colon without hemorrhage 08/01/2015   Statin intolerance 08/01/2015   Basal cell carcinoma of nose 08/23/2014   Rosacea 08/23/2014   Seasonal allergies 01/26/2013   Type 2 diabetes mellitus with hyperglycemia, without long-term current use of insulin ([place]) 07/09/2012   Hyperlipidemia associated with type 2 diabetes mellitus ([place]) 07/09/2012   Erectile dysfunction 07/09/2012    Past Medical History:  Diagnosis Date   Allergy    Basal cell carcinoma 01/2013   Nasal and R forearm.  [facility]  Dermatology   CAD S/P percutaneous coronary angioplasty    2/19 PCI/DESx1 to mLAD, FFR 0.7 -Sierra DES 2.75 mm x 18 mm   Cataract    Diabetes mellitus without complication (HCC)    on PO Meds   Essential hypertension    Family history of colon cancer    Family history of malignant neoplasm of breast    Heart murmur    Hyperlipidemia    statin intolerant -- myalgias, fatigue (tried at least 4)   Polyposis of colon    Rosacea     Past Surgical History:  Procedure Laterality Date   basal cell removal     nose and wrist    BELPHAROPTOSIS REPAIR     CORONARY CT ANGIOGRAM  09/2017   Coronary Calcium Score: 111. Coronary calcification noted in the LEFT MAIN (LM) and prox LAD.  LM < 50% calcified stenosis.  Mid LAD 50-75% plaque noted CT FFR --> positive at 0.80.  Recommend CARDIAC CATHETERIZATION   CORONARY STENT INTERVENTION N/A 11/12/2017   Procedure: CORONARY STENT INTERVENTION;  Surgeon: Leonie Man, MD;  Location: [place] [facility];  Service: Cardiovascular:  DES PCI:  STENT SIERRA 2.75 X 18 MM - Post intervention, there is a 0% residual stenosis.   ETT/GXT: Graded Exercise Tolerance Test  08/2015    Exercised for 9:01 min --> blunted blood pressure response no EKG changes.  No chest pain.  Low Risk   EYE SURGERY     Cataract surgery   eyelid surgery     INTRAVASCULAR PRESSURE WIRE/FFR STUDY N/A 11/12/2017   Procedure: INTRAVASCULAR PRESSURE WIRE/FFR STUDY;  Surgeon: Leonie Man, MD;  Location: [place] [facility];  Service: Cardiovascular;  Laterality: LAD -FFR 0.76   LEFT HEART CATH AND CORONARY ANGIOGRAPHY N/A 11/12/2017   Procedure: LEFT HEART CATH AND CORONARY ANGIOGRAPHY;  Surgeon: Leonie Man, MD;  [facility]: [facility]: 70% P-M LAD (FFR 0.76).  40% mid LAD, 50% ostial ramus.  EF 55-60%.  Mildly elevated LVEDP.   SHOULDER OPEN ROTATOR CUFF REPAIR  1983   TRANSTHORACIC ECHOCARDIOGRAM  08/2017   EF 55-60%.  Mild aortic stenosis (mean gradient 11 mmH.)  GR 1  DD.  Mild LA dilation.    Social History   Socioeconomic History   Marital status: Married    Spouse name: [name]   Number of children: 4   Years of education: 20   Highest education level: Not on file  Occupational History   Occupation: bus Engineer, manufacturing systems: [employer]  Tobacco Use   Smoking status: Former    Packs/day: 1.00    Years: 15.00    Total pack years: 15.00    Types: Cigarettes    Quit date: 07/09/1982    Years since quitting: 39.6   Smokeless tobacco: Never  Substance and Sexual Activity   Alcohol use: No   Drug use: No   Sexual activity: Yes  Partners: Female  Other Topics Concern   Not on file  Social History Narrative   Marital status: married x 33 years; happily married.   Lives: with wife       Children:  4 children; 4 grandchildren. Adult children all live locally.          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      Tobacco:  In past; smoked x 15 years; quit 35 years ago.      Alcohol: none      ADLs: independent with all ADLs.  Does not use assistant device with ambulation.      Living Will:  Has living will; desires FULL CODE; no prolonged measures.      Doctoral degree in Biblical Studies.          Diet: eats twice daily, eggs,sausage,and fruits for breakfast; chicken (fried or broil), occassional pizza, salads, steak and pork chops.        Exercise: cardiac rehab, transition to [facility] - 45 minutes walks and resistance 5x/week   Social Determinants of Health   Financial Resource Strain: Not on file  Food Insecurity: Not on file  Transportation Needs: Not on file  Physical Activity: Not on file  Stress: Not on file  Social Connections: Not on file  Intimate Partner Violence: Not on file    Family History  Problem Relation Age of Onset   Leukemia Mother 67       d. 24   Stroke Father 18       as a complication of CABG   Colon cancer  Father 39   Coronary artery disease Father 73       - Sx was DOE - MV CAD   Peripheral Artery Disease Father        presumptive   Diabetes Sister    Sudden Cardiac Death Sister 41       presumed MI   Heart attack Brother 81       During throat surgery   Congestive Heart Failure Brother        Died @ 21 - ? CHF / ICM   Breast cancer Maternal Grandmother        dx in her 72s   Stroke Maternal Grandfather    Diabetes Brother 67       on lots of meds   Diabetes Brother    Diabetes Paternal Grandmother    Rheumatic fever Brother        childhood RF --> Valvular Dz - valve Sgx,  died young   Stomach cancer Neg Hx    Esophageal cancer Neg Hx      Review of Systems  Constitutional: Negative.  Negative for chills and fever.  HENT: Negative.  Negative for congestion and sore throat.   Respiratory: Negative.  Negative for cough and shortness of breath.   Cardiovascular: Negative.  Negative for chest pain and palpitations.  Gastrointestinal:  Negative for abdominal pain, diarrhea, nausea and vomiting.  Genitourinary: Negative.  Negative for dysuria.  Skin: Negative.  Negative for rash.  Neurological:  Negative for dizziness and headaches.  All other systems reviewed and are negative.  Today's Vitals   03/04/22 0808  BP: 138/64  Pulse: 62  Temp: 97.9 F (36.6 C)  TempSrc: Oral  SpO2: 98%  Weight: 211 lb (95.7 kg)  Height: '5\' 9"'  (1.753 m)   Body mass index is 31.16 kg/m.   Physical Exam  Vitals reviewed.  Constitutional:      Appearance: Normal appearance.  HENT:     Head: Normocephalic.  Eyes:     Extraocular Movements: Extraocular movements intact.     Pupils: Pupils are equal, round, and reactive to light.  Cardiovascular:     Rate and Rhythm: Normal rate and regular rhythm.     Heart sounds: Murmur (Systolic 3/6) heard.  Pulmonary:     Effort: Pulmonary effort is normal.     Breath sounds: Normal breath sounds.  Musculoskeletal:     Cervical back: No  tenderness.     Right lower leg: No edema.     Left lower leg: No edema.  Lymphadenopathy:     Cervical: No cervical adenopathy.  Skin:    General: Skin is warm and dry.     Capillary Refill: Capillary refill takes less than 2 seconds.  Neurological:     General: No focal deficit present.     Mental Status: He is alert and oriented to person, place, and time.  Psychiatric:        Mood and Affect: Mood normal.        Behavior: Behavior normal.    Results for orders placed or performed in visit on 03/04/22 (from the past 24 hour(s))  POCT HgB A1C     Status: Abnormal   Collection Time: 03/04/22  8:51 AM  Result Value Ref Range   Hemoglobin A1C 6.6 (A) 4.0 - 5.6 %   HbA1c POC (<> result, manual entry)     HbA1c, POC (prediabetic range)     HbA1c, POC (controlled diabetic range)       ASSESSMENT & PLAN: A total of 47 minutes was spent with the patient and counseling/coordination of care regarding preparing for this visit, review of most recent office visit notes, review of multiple chronic medical problems under management, review of all medications, review of most recent blood work results including today's hemoglobin A1c, education on nutrition, cardiovascular risks associated with hypertension and diabetes, prognosis, documentation, and need for follow-up.  Problem List Items Addressed This Visit       Cardiovascular and Mediastinum   Hypertension associated with diabetes ([place]) - Primary (Chronic)    Well-controlled hypertension.  Continue metoprolol succinate 25 mg daily and losartan 50 mg daily. BP Readings from Last 3 Encounters:  03/04/22 138/64  01/13/22 (!) 142/64  10/19/21 134/72  Well-controlled diabetes with hemoglobin A1c better than before at 6.6. Patient only taking glipizide 5 mg twice a day.  Not taking metformin due to GI side effects. Diet and nutrition discussed. Cardiovascular risks associated with diabetes and hypertension discussed. Follow-up in 6  months.       Relevant Medications   glipiZIDE (GLUCOTROL) 5 MG tablet   losartan (COZAAR) 50 MG tablet   Other Relevant Orders   Comprehensive metabolic panel   Urine Microalbumin w/creat. ratio   Mild aortic stenosis by prior echocardiogram (Chronic)    Stable and asymptomatic.  Murmur on exam.      Relevant Medications   losartan (COZAAR) 50 MG tablet   Coronary artery disease involving native coronary artery of native heart with angina pectoris (HCC)    Stable.  No anginal episodes. Denies dyspnea on exertion or chest pain on exertion. Continue metoprolol succinate 25 mg daily. Continue Plavix 75 mg daily.      Relevant Medications   losartan (COZAAR) 50 MG tablet     Endocrine   Type 2 diabetes mellitus with hyperglycemia, without  long-term current use of insulin (HCC) (Chronic)   Relevant Medications   glipiZIDE (GLUCOTROL) 5 MG tablet   losartan (COZAAR) 50 MG tablet   Other Relevant Orders   POCT HgB A1C   Hyperlipidemia associated with type 2 diabetes mellitus ([place]) (Chronic)    Stable.  Diet and nutrition discussed. Continue Zetia 10 mg daily. Intolerant to statins.      Relevant Medications   glipiZIDE (GLUCOTROL) 5 MG tablet   losartan (COZAAR) 50 MG tablet   Other Relevant Orders   Lipid panel   Patient Instructions  Diabetes Mellitus and Nutrition, Adult When you have diabetes, or diabetes mellitus, it is very important to have healthy eating habits because your blood sugar (glucose) levels are greatly affected by what you eat and drink. Eating healthy foods in the right amounts, at about the same times every day, can help you: Manage your blood glucose. Lower your risk of heart disease. Improve your blood pressure. Reach or maintain a healthy weight. What can affect my meal plan? Every person with diabetes is different, and each person has different needs for a meal plan. Your health care provider may recommend that you work with a dietitian to make  a meal plan that is best for you. Your meal plan may vary depending on factors such as: The calories you need. The medicines you take. Your weight. Your blood glucose, blood pressure, and cholesterol levels. Your activity level. Other health conditions you have, such as heart or kidney disease. How do carbohydrates affect me? Carbohydrates, also called carbs, affect your blood glucose level more than any other type of food. Eating carbs raises the amount of glucose in your blood. It is important to know how many carbs you can safely have in each meal. This is different for every person. Your dietitian can help you calculate how many carbs you should have at each meal and for each snack. How does alcohol affect me? Alcohol can cause a decrease in blood glucose (hypoglycemia), especially if you use insulin or take certain diabetes medicines by mouth. Hypoglycemia can be a life-threatening condition. Symptoms of hypoglycemia, such as sleepiness, dizziness, and confusion, are similar to symptoms of having too much alcohol. Do not drink alcohol if: Your health care provider tells you not to drink. You are pregnant, may be pregnant, or are planning to become pregnant. If you drink alcohol: Limit how much you have to: 0-1 drink a day for women. 0-2 drinks a day for men. Know how much alcohol is in your drink. In the [place] one drink equals one 12 oz bottle of beer (355 mL), one 5 oz glass of wine (148 mL), or one 1 oz glass of hard liquor (44 mL). Keep yourself hydrated with water, diet soda, or unsweetened iced tea. Keep in mind that regular soda, juice, and other mixers may contain a lot of sugar and must be counted as carbs. What are tips for following this plan?  Reading food labels Start by checking the serving size on the Nutrition Facts label of packaged foods and drinks. The number of calories and the amount of carbs, fats, and other nutrients listed on the label are based on one serving of  the item. Many items contain more than one serving per package. Check the total grams (g) of carbs in one serving. Check the number of grams of saturated fats and trans fats in one serving. Choose foods that have a low amount or none of these fats. Check the  number of milligrams (mg) of salt (sodium) in one serving. Most people should limit total sodium intake to less than 2,300 mg per day. Always check the nutrition information of foods labeled as "low-fat" or "nonfat." These foods may be higher in added sugar or refined carbs and should be avoided. Talk to your dietitian to identify your daily goals for nutrients listed on the label. Shopping Avoid buying canned, pre-made, or processed foods. These foods tend to be high in fat, sodium, and added sugar. Shop around the outside edge of the grocery store. This is where you will most often find fresh fruits and vegetables, bulk grains, fresh meats, and fresh dairy products. Cooking Use low-heat cooking methods, such as baking, instead of high-heat cooking methods, such as deep frying. Cook using healthy oils, such as olive, canola, or sunflower oil. Avoid cooking with butter, cream, or high-fat meats. Meal planning Eat meals and snacks regularly, preferably at the same times every day. Avoid going long periods of time without eating. Eat foods that are high in fiber, such as fresh fruits, vegetables, beans, and whole grains. Eat 4-6 oz (112-168 g) of lean protein each day, such as lean meat, chicken, fish, eggs, or tofu. One ounce (oz) (28 g) of lean protein is equal to: 1 oz (28 g) of meat, chicken, or fish. 1 egg.  cup (62 g) of tofu. Eat some foods each day that contain healthy fats, such as avocado, nuts, seeds, and fish. What foods should I eat? Fruits Berries. Apples. Oranges. Peaches. Apricots. Plums. Grapes. Mangoes. Papayas. Pomegranates. Kiwi. Cherries. Vegetables Leafy greens, including lettuce, spinach, kale, chard, collard  greens, mustard greens, and cabbage. Beets. Cauliflower. Broccoli. Carrots. Green beans. Tomatoes. Peppers. Onions. Cucumbers. Brussels sprouts. Grains Whole grains, such as whole-wheat or whole-grain bread, crackers, tortillas, cereal, and pasta. Unsweetened oatmeal. Quinoa. Brown or wild rice. Meats and other proteins Seafood. Poultry without skin. Lean cuts of poultry and beef. Tofu. Nuts. Seeds. Dairy Low-fat or fat-free dairy products such as milk, yogurt, and cheese. The items listed above may not be a complete list of foods and beverages you can eat and drink. Contact a dietitian for more information. What foods should I avoid? Fruits Fruits canned with syrup. Vegetables Canned vegetables. Frozen vegetables with butter or cream sauce. Grains Refined white flour and flour products such as bread, pasta, snack foods, and cereals. Avoid all processed foods. Meats and other proteins Fatty cuts of meat. Poultry with skin. Breaded or fried meats. Processed meat. Avoid saturated fats. Dairy Full-fat yogurt, cheese, or milk. Beverages Sweetened drinks, such as soda or iced tea. The items listed above may not be a complete list of foods and beverages you should avoid. Contact a dietitian for more information. Questions to ask a health care provider Do I need to meet with a certified diabetes care and education specialist? Do I need to meet with a dietitian? What number can I call if I have questions? When are the best times to check my blood glucose? Where to find more information: American Diabetes Association: diabetes.org Academy of Nutrition and Dietetics: eatright.[employer] of Diabetes and Digestive and Kidney Diseases: AmenCredit.is Association of Diabetes Care & Education Specialists: diabeteseducator.org Summary It is important to have healthy eating habits because your blood sugar (glucose) levels are greatly affected by what you eat and drink. It is important to  use alcohol carefully. A healthy meal plan will help you manage your blood glucose and lower your risk of heart disease. Your  health care provider may recommend that you work with a dietitian to make a meal plan that is best for you. This information is not intended to replace advice given to you by your health care provider. Make sure you discuss any questions you have with your health care provider. Document Revised: 04/12/2020 Document Reviewed: 04/12/2020 Elsevier Patient Education  [place], MD [facility]

## 2022-06-04 ENCOUNTER — Telehealth: Payer: Self-pay | Admitting: Internal Medicine

## 2022-06-04 ENCOUNTER — Encounter (HOSPITAL_BASED_OUTPATIENT_CLINIC_OR_DEPARTMENT_OTHER): Payer: Self-pay | Admitting: Internal Medicine

## 2022-06-04 ENCOUNTER — Ambulatory Visit (INDEPENDENT_AMBULATORY_CARE_PROVIDER_SITE_OTHER): Payer: Medicare Other | Admitting: Internal Medicine

## 2022-06-04 VITALS — BP 127/72 | HR 58 | Ht 69.0 in | Wt 214.0 lb

## 2022-06-04 DIAGNOSIS — T466X5D Adverse effect of antihyperlipidemic and antiarteriosclerotic drugs, subsequent encounter: Secondary | ICD-10-CM

## 2022-06-04 DIAGNOSIS — G720 Drug-induced myopathy: Secondary | ICD-10-CM

## 2022-06-04 DIAGNOSIS — I350 Nonrheumatic aortic (valve) stenosis: Secondary | ICD-10-CM | POA: Diagnosis not present

## 2022-06-04 DIAGNOSIS — E7800 Pure hypercholesterolemia, unspecified: Secondary | ICD-10-CM

## 2022-06-04 DIAGNOSIS — I2510 Atherosclerotic heart disease of native coronary artery without angina pectoris: Secondary | ICD-10-CM

## 2022-06-04 NOTE — Telephone Encounter (Signed)
Patient has been identified as candidate for Leqvio BI form completed/faxed to service center

## 2022-06-04 NOTE — Patient Instructions (Signed)
Medication Instructions:  Your Leqvio injection is scheduled on _______ at ________. Please arrive at [facility] and enter the hospital through [place] (the [employer]) that's located at [facility] 15 minutes before your scheduled injection time. Walk in to the registration desk and let them know that you are scheduled for an injection in the infusion center. They will register you and take you to your appointment.   If you will need patient assistance, the injections will be administered by a health care provider at a [facility] off [address].   Marion Downer is a subcutaneous injection that will lower your LDL cholesterol by 50%. If this is your first injection, your next injection will be due in 3 months. If this is NOT your first injection, your next injection will be due in 6 months. If you have not heard from [facility] to schedule your next appointment within 2 weeks of your next anticipated injection, please call [facility] to schedule this at:                [phone] - if you are seen at the [facility] office               [phone] - if you are seen at the [facility] office  *If you need a refill on your cardiac medications before your next appointment, please call your pharmacy*   Lab Work: FASTING lab work in 5-6 months  -- complete about 1 week before next visit  If you have labs (blood work) drawn today and your tests are completely normal, you will receive your results only by: [place] (if you have [place]) OR A paper copy in the mail If you have any lab test that is abnormal or we need to change your treatment, we will call you to review the results.   Testing/Procedures: NONE   Follow-Up: At [facility], you and your health needs are our priority.  As part of our continuing mission to provide you with exceptional heart care, we have created designated Provider Care Teams.  These Care  Teams include your primary Cardiologist (physician) and Advanced Practice Providers (APPs -  Physician Assistants and Nurse Practitioners) who all work together to provide you with the care you need, when you need it.  We recommend signing up for the patient portal called "MyChart".  Sign up information is provided on this After Visit Summary.  MyChart is used to connect with patients for Virtual Visits (Telemedicine).  Patients are able to view lab/test results, encounter notes, upcoming appointments, etc.  Non-urgent messages can be sent to your provider as well.   To learn more about what you can do with MyChart, go to [email or web address].    Your next appointment:    5-6 months with Dr. Debara Pickett -- lipid clinic

## 2022-06-06 NOTE — Progress Notes (Signed)
LIPID CLINIC CONSULT NOTE  Chief Complaint:  Evaluation and management of dyslipidemia  Primary Care Physician: Horald Pollen, MD  Primary Cardiologist:  Glenetta Hew, MD  HPI:  Gregory Limerick Del Overfelt. is a 76 y.o. male who is being seen today for the evaluation of lipidemia at the request of Horald Pollen, *.  This is a pleasant 76 year old male patient of Dr. Ellyn Hack with a history of atherosclerotic cardiovascular disease and dyslipidemia.  Recent lipid showed total cholesterol 152, triglycerides 151, HDL 34 and LDL 83, down from 106.  He has had a previous calcium score which was 111, 64th percentile for age and sex matched controls.  Previously tried 4 different statins and Repatha, most recently, all of which caused muscle aches.  PMHx:  Past Medical History:  Diagnosis Date   Allergy    Basal cell carcinoma 01/2013   Nasal and R forearm.  [facility]   CAD S/P percutaneous coronary angioplasty    2/19 PCI/DESx1 to mLAD, FFR 0.7 -Sierra DES 2.75 mm x 18 mm   Cataract    Diabetes mellitus without complication (HCC)    on PO Meds   Essential hypertension    Family history of colon cancer    Family history of malignant neoplasm of breast    Heart murmur    Hyperlipidemia    statin intolerant -- myalgias, fatigue (tried at least 4)   Polyposis of colon    Rosacea     Past Surgical History:  Procedure Laterality Date   basal cell removal     nose and wrist    BELPHAROPTOSIS REPAIR     CORONARY CT ANGIOGRAM  09/2017   Coronary Calcium Score: 111. Coronary calcification noted in the LEFT MAIN (LM) and prox LAD.  LM < 50% calcified stenosis.  Mid LAD 50-75% plaque noted CT FFR --> positive at 0.80.  Recommend CARDIAC CATHETERIZATION   CORONARY STENT INTERVENTION N/A 11/12/2017   Procedure: CORONARY STENT INTERVENTION;  Surgeon: Leonie Man, MD;  Location: [place] [facility];  Service: Cardiovascular:  DES PCI:  STENT SIERRA 2.75 X 18 MM - Post  intervention, there is a 0% residual stenosis.   ETT/GXT: Graded Exercise Tolerance Test  08/2015    Exercised for 9:01 min --> blunted blood pressure response no EKG changes.  No chest pain.  Low Risk   EYE SURGERY     Cataract surgery   eyelid surgery     INTRAVASCULAR PRESSURE WIRE/FFR STUDY N/A 11/12/2017   Procedure: INTRAVASCULAR PRESSURE WIRE/FFR STUDY;  Surgeon: Leonie Man, MD;  Location: [place] [facility];  Service: Cardiovascular;  Laterality: LAD -FFR 0.76   LEFT HEART CATH AND CORONARY ANGIOGRAPHY N/A 11/12/2017   Procedure: LEFT HEART CATH AND CORONARY ANGIOGRAPHY;  Surgeon: Leonie Man, MD;  Location: [facility]: 70% P-M LAD (FFR 0.76).  40% mid LAD, 50% ostial ramus.  EF 55-60%.  Mildly elevated LVEDP.   SHOULDER OPEN ROTATOR CUFF REPAIR  1983   TRANSTHORACIC ECHOCARDIOGRAM  08/2017   EF 55-60%.  Mild aortic stenosis (mean gradient 11 mmH.)  GR 1 DD.  Mild LA dilation.    FAMHx:  Family History  Problem Relation Age of Onset   Leukemia Mother 16       d. 46   Stroke Father 64       as a complication of CABG   Colon cancer Father 25   Coronary artery disease Father 44       -  Sx was DOE - MV CAD   Peripheral Artery Disease Father        presumptive   Diabetes Sister    Sudden Cardiac Death Sister 19       presumed MI   Heart attack Brother 93       During throat surgery   Congestive Heart Failure Brother        Died @ 12 - ? CHF / ICM   Breast cancer Maternal Grandmother        dx in her 41s   Stroke Maternal Grandfather    Diabetes Brother 71       on lots of meds   Diabetes Brother    Diabetes Paternal Grandmother    Rheumatic fever Brother        childhood RF --> Valvular Dz - valve Sgx,  died young   Stomach cancer Neg Hx    Esophageal cancer Neg Hx     SOCHx:   reports that he quit smoking about 39 years ago. His smoking use included cigarettes. He has a 15.00 pack-year smoking history. He has never used smokeless tobacco. He  reports that he does not drink alcohol and does not use drugs.  ALLERGIES:  Allergies  Allergen Reactions   Januvia [Sitagliptin] Other (See Comments)    Gi upset and kidney problems   Lisinopril Cough   Repatha [Evolocumab] Other (See Comments)    Myalgias    Statins Other (See Comments)    Severe leg pain & fatigue -- tried at least 4 (even low dose)    ROS: Pertinent items noted in HPI and remainder of comprehensive ROS otherwise negative.  HOME MEDS: Current Outpatient Medications on File Prior to Visit  Medication Sig Dispense Refill   blood glucose meter kit and supplies Use to test blood sugar daily. Dx: E11.9 1 each 0   clopidogrel (PLAVIX) 75 MG tablet Take 1 tablet (75 mg total) by mouth daily. Annual appt due in Nov must see provider for future refills 90 tablet 2   ezetimibe (ZETIA) 10 MG tablet TAKE 1 TABLET BY MOUTH EVERY DAY 90 tablet 1   glipiZIDE (GLUCOTROL) 5 MG tablet Take 1 tablet (5 mg total) by mouth 2 (two) times daily before a meal. 180 tablet 3   Lancets MISC Use to test blood sugar daily. Dx code :E11.9 100 each 3   losartan (COZAAR) 50 MG tablet Take 1 tablet (50 mg total) by mouth daily. 90 tablet 3   metoprolol succinate (TOPROL-XL) 25 MG 24 hr tablet TAKE 1 TABLET (25 MG TOTAL) BY MOUTH DAILY. 90 tablet 3   Multiple Vitamin (MULTIVITAMIN) tablet Take 1 tablet by mouth daily.     ONETOUCH ULTRA test strip USE TO TEST BLOOD SUGAR ONCE DAILY 100 strip 11   phenol (CHLORASEPTIC GARGLE) 1.4 % LIQD Use as directed 1 spray in the mouth or throat as needed for throat irritation / pain. 20 mL 0   No current facility-administered medications on file prior to visit.    LABS/IMAGING: No results found for this or any previous visit (from the past 48 hour(s)). No results found.  LIPID PANEL:    Component Value Date/Time   CHOL 152 03/04/2022 0852   CHOL 86 (L) 11/16/2020 0857   TRIG 151.0 (H) 03/04/2022 0852   HDL 39.30 03/04/2022 0852   HDL 41 11/16/2020  0857   CHOLHDL 4 03/04/2022 0852   VLDL 30.2 03/04/2022 0852   LDLCALC 83 03/04/2022 7371  [place] 24 11/16/2020 0857    WEIGHTS: Wt Readings from Last 3 Encounters:  06/04/22 214 lb (97.1 kg)  03/04/22 211 lb (95.7 kg)  10/26/21 216 lb (98 kg)    VITALS: BP 127/72 (BP Location: Left Arm, Patient Position: Sitting, Cuff Size: Large)   Pulse (!) 58   Ht _0  (1.753 m)   Wt 214 lb (97.1 kg)   SpO2 97%   BMI 31.60 kg/m   EXAM: For  EKG: Deferred  ASSESSMENT: Mixed dyslipidemia, goal LDL <70 Hypertension Myalgia on statins Myalgia on Repatha  PLAN: 1.   Gregory Mathews needs significant lipid-lowering and cannot tolerate statins or Repatha per his report.  Will therefore pursue Leqvio which is very well-tolerated although may be cost prohibitive.  We will contact him after prior authorization.  Plan follow-up with me in about 6 months.  Thanks for the kind referral.  Pixie Casino, MD, FACC, [place] Director of the [facility] Lipid Disorders &  Cardiovascular Risk Reduction Clinic Diplomate of the American Board of Clinical Lipidology Attending Cardiologist  Direct Dial: [phone]  Fax: [phone]  Website:  [email or web address] 06/06/2022, 9:10 PM

## 2022-06-07 ENCOUNTER — Telehealth: Payer: Self-pay | Admitting: Emergency Medicine

## 2022-06-07 NOTE — Telephone Encounter (Signed)
Left message for patient to call back to schedule Medicare Annual Wellness Visit   Last AWV  06/10/21  Please schedule at anytime with [facility] [place] if patient calls the office back.     Any questions, please call me at [phone]

## 2022-06-11 ENCOUNTER — Encounter: Payer: Self-pay | Admitting: Internal Medicine

## 2022-06-11 DIAGNOSIS — I2510 Atherosclerotic heart disease of native coronary artery without angina pectoris: Secondary | ICD-10-CM | POA: Insufficient documentation

## 2022-06-11 NOTE — Telephone Encounter (Signed)
Patient updated via MyChat message Referral to [facility] infusion clinic ordered

## 2022-06-11 NOTE — Addendum Note (Signed)
Addended by: Fidel Levy on: 06/11/2022 02:08 PM   Modules accepted: Orders

## 2022-06-11 NOTE — Telephone Encounter (Signed)
Patient has been identified as candidate for Leqvio  Benefits investigation enrollment completed on 06/05/22  Benefits investigation report notes the following:  Type of insurance: medicare part B and BCBS state health plan PPO (commerical)  OOP Max: $3600 / Met: $313.80  Deductible: n/a  Co-insurance: 20%  PA required: Y  PA phone number: n/a  Benefits Summary Details:  Primary: [employer] medicare advantage HMO POS plan 3 - leqvio covered at 80% until OOP max is met. Once this is met, leqvio is covered at 100% Secondary: [employer] of [place] health plan - leqvio is covered at 100%

## 2022-06-12 ENCOUNTER — Other Ambulatory Visit: Payer: Self-pay | Admitting: Emergency Medicine

## 2022-06-12 DIAGNOSIS — E1169 Type 2 diabetes mellitus with other specified complication: Secondary | ICD-10-CM

## 2022-06-14 ENCOUNTER — Telehealth: Payer: Self-pay | Admitting: Pharmacy Technician

## 2022-06-14 NOTE — Telephone Encounter (Signed)
Auth Submission: PENDING Payer: [place] / BCBS SECONDARY Medication & CPT/J Code(s) submitted: Leqvio (Inclisiran) [phone] Route of submission (phone, fax, portal): [employer]HC: Educational psychologist Phone [phone] Fax # [phone] Auth type: Buy/Bill Units/visits requested: '284MG'$  X 3 [place] (primary) APPROVED: 06/13/22 - 06/14/23 PA: [id number]  [place] (secondary): PENDING  Approval from:  to  at [place]

## 2022-06-17 ENCOUNTER — Other Ambulatory Visit: Payer: Self-pay

## 2022-06-24 ENCOUNTER — Ambulatory Visit (INDEPENDENT_AMBULATORY_CARE_PROVIDER_SITE_OTHER): Payer: Medicare Other | Admitting: Family Medicine

## 2022-06-24 ENCOUNTER — Ambulatory Visit (INDEPENDENT_AMBULATORY_CARE_PROVIDER_SITE_OTHER): Payer: Medicare Other

## 2022-06-24 ENCOUNTER — Encounter: Payer: Self-pay | Admitting: Family Medicine

## 2022-06-24 VITALS — Ht 69.0 in | Wt 210.0 lb

## 2022-06-24 VITALS — BP 124/72 | HR 79 | Temp 98.5°F | Ht 69.0 in | Wt 213.5 lb

## 2022-06-24 DIAGNOSIS — R051 Acute cough: Secondary | ICD-10-CM

## 2022-06-24 DIAGNOSIS — J0140 Acute pansinusitis, unspecified: Secondary | ICD-10-CM

## 2022-06-24 DIAGNOSIS — J029 Acute pharyngitis, unspecified: Secondary | ICD-10-CM | POA: Diagnosis not present

## 2022-06-24 DIAGNOSIS — R21 Rash and other nonspecific skin eruption: Secondary | ICD-10-CM

## 2022-06-24 DIAGNOSIS — Z0000 Encounter for general adult medical examination without abnormal findings: Secondary | ICD-10-CM | POA: Diagnosis not present

## 2022-06-24 LAB — POC COVID19 BINAXNOW: SARS Coronavirus 2 Ag: NEGATIVE

## 2022-06-24 MED ORDER — AMOXICILLIN 875 MG PO TABS: 875.0000 mg | ORAL_TABLET | Freq: Two times a day (BID) | ORAL | 0 refills | Status: AC

## 2022-06-24 MED ORDER — HYDROCOD POLI-CHLORPHE POLI ER 10-8 MG/5ML PO SUER: 5.0000 mL | Freq: Every evening | ORAL | 0 refills | Status: DC | PRN

## 2022-06-24 MED ORDER — CLOTRIMAZOLE-BETAMETHASONE 1-0.05 % EX CREA: 1.0000 | TOPICAL_CREAM | Freq: Every day | CUTANEOUS | 0 refills | Status: DC

## 2022-06-24 NOTE — Patient Instructions (Addendum)
Take the antibiotic as prescribed.  You may take a steroid nasal spray such as Flonase or Nasacort as well as plain Mucinex.  Stay well-hydrated.  Use salt water gargles and Tylenol for sore throat.  You may also want to try Chloraseptic or cough drops.  Use the topical Lotrisone prescription for the rash and irritation of your groin.  Follow-up if this is getting worse or not improving.

## 2022-06-24 NOTE — Progress Notes (Signed)
Subjective:   Gregory Mathews. is a 76 y.o. male who presents for Medicare Annual/Subsequent preventive examination.   Virtual Visit via Telephone Note  I connected with  Gregory Mathews. on 06/24/22 at  8:45 AM EDT by telephone and verified that I am speaking with the correct person using two identifiers.  Location: Patient: Home Provider: [facility]  Persons participating in the virtual visit: patient/Nurse Health Advisor   I discussed the limitations, risks, security and privacy concerns of performing an evaluation and management service by telephone and the availability of in person appointments. The patient expressed understanding and agreed to proceed.  Interactive audio and video telecommunications were attempted between this nurse and patient, however failed, due to patient having technical difficulties OR patient did not have access to video capability.  We continued and completed visit with audio only.  Some vital signs may be absent or patient reported.   Daphane Shepherd, LPN  Review of Systems     Cardiac Risk Factors include: advanced age (>91mn, >>7women);diabetes mellitus;dyslipidemia;hypertension;male gender     Objective:    Today's Vitals   06/24/22 0849  Weight: 210 lb (95.3 kg)  Height: _0  (1.753 m)   Body mass index is 31.01 kg/m.     01/21/2020   11:10 AM 11/12/2017    9:41 AM 08/22/2014    2:23 PM 02/25/2014   12:46 PM  Advanced Directives  Does Patient Have a Medical Advance Directive? No No Yes Patient does not have advance directive  Type of Advance Directive   Living will   [place] of [place]?   No - copy requested   Would patient like information on creating a medical advance directive? Yes (ED - Information included in AVS) No - Patient declined      Current Medications (verified) Outpatient Encounter Medications as of 06/24/2022  Medication Sig   blood glucose meter kit and supplies Use to test blood  sugar daily. Dx: E11.9   clopidogrel (PLAVIX) 75 MG tablet Take 1 tablet (75 mg total) by mouth daily. Annual appt due in Nov must see provider for future refills   ezetimibe (ZETIA) 10 MG tablet TAKE 1 TABLET BY MOUTH EVERY DAY   glipiZIDE (GLUCOTROL) 5 MG tablet Take 1 tablet (5 mg total) by mouth 2 (two) times daily before a meal.   Lancets MISC Use to test blood sugar daily. Dx code :E11.9   losartan (COZAAR) 50 MG tablet Take 1 tablet (50 mg total) by mouth daily.   metoprolol succinate (TOPROL-XL) 25 MG 24 hr tablet TAKE 1 TABLET (25 MG TOTAL) BY MOUTH DAILY.   Multiple Vitamin (MULTIVITAMIN) tablet Take 1 tablet by mouth daily.   ONETOUCH ULTRA test strip USE TO TEST BLOOD SUGAR ONCE DAILY   phenol (CHLORASEPTIC GARGLE) 1.4 % LIQD Use as directed 1 spray in the mouth or throat as needed for throat irritation / pain.   No facility-administered encounter medications on file as of 06/24/2022.    Allergies (verified) Januvia [sitagliptin], Lisinopril, Repatha [evolocumab], and Statins   History: Past Medical History:  Diagnosis Date   Allergy    Basal cell carcinoma 01/2013   [facility]al [facility]   CAD S/P percutaneous coronary angioplasty    2/19 PCI/DESx1 to mLAD, FFR 0.7 -Sierra DES 2.75 mm x 18 mm   Cataract    Diabetes mellitus without complication (HCC)    on PO Meds   Essential hypertension  Family history of colon cancer    Family history of malignant neoplasm of breast    Heart murmur    Hyperlipidemia    statin intolerant -- myalgias, fatigue (tried at least 4)   Polyposis of colon    Rosacea    Past Surgical History:  Procedure Laterality Date   basal cell removal     nose and wrist    BELPHAROPTOSIS REPAIR     CORONARY CT ANGIOGRAM  09/2017   Coronary Calcium Score: 111. Coronary calcification noted in the LEFT MAIN (LM) and prox LAD.  LM < 50% calcified stenosis.  Mid LAD 50-75% plaque noted CT FFR --> positive at 0.80.  Recommend  CARDIAC CATHETERIZATION   CORONARY STENT INTERVENTION N/A 11/12/2017   Procedure: CORONARY STENT INTERVENTION;  Surgeon: Leonie Man, MD;  Location: [facility];  Service: Cardiovascular:  DES PCI:  STENT SIERRA 2.75 X 18 MM - Post intervention, there is a 0% residual stenosis.   ETT/GXT: Graded Exercise Tolerance Test  08/2015    Exercised for 9:01 min --> blunted blood pressure response no EKG changes.  No chest pain.  Low Risk   EYE SURGERY     Cataract surgery   eyelid surgery     INTRAVASCULAR PRESSURE WIRE/FFR STUDY N/A 11/12/2017   Procedure: INTRAVASCULAR PRESSURE WIRE/FFR STUDY;  Surgeon: Leonie Man, MD;  Location: [facility];  Service: Cardiovascular;  Laterality: LAD -FFR 0.76   LEFT HEART CATH AND CORONARY ANGIOGRAPHY N/A 11/12/2017   Procedure: LEFT HEART CATH AND CORONARY ANGIOGRAPHY;  Surgeon: Leonie Man, MD;  Location: [facility]: 70% P-M LAD (FFR 0.76).  40% mid LAD, 50% ostial ramus.  EF 55-60%.  Mildly elevated LVEDP.   SHOULDER OPEN ROTATOR CUFF REPAIR  1983   TRANSTHORACIC ECHOCARDIOGRAM  08/2017   EF 55-60%.  Mild aortic stenosis (mean gradient 11 mmH.)  GR 1 DD.  Mild LA dilation.   Family History  Problem Relation Age of Onset   Leukemia Mother 59       d. 77   Stroke Father 35       as a complication of CABG   Colon cancer Father 19   Coronary artery disease Father 72       - Sx was DOE - MV CAD   Peripheral Artery Disease Father        presumptive   Diabetes Sister    Sudden Cardiac Death Sister 64       presumed MI   Heart attack Brother 81       During throat surgery   Congestive Heart Failure Brother        Died @ 51 - ? CHF / ICM   Breast cancer Maternal Grandmother        dx in her 61s   Stroke Maternal Grandfather    Diabetes Brother 69       on lots of meds   Diabetes Brother    Diabetes Paternal Grandmother    Rheumatic fever Brother        childhood RF --> Valvular Dz - valve Sgx,  died young   Stomach  cancer Neg Hx    Esophageal cancer Neg Hx    Social History   Socioeconomic History   Marital status: Married    Spouse name: [name]   Number of children: 4   Years of education: 20   Highest education level: Not on file  Occupational History  Occupation: bus Engineer, manufacturing systems: [employer]  Tobacco Use   Smoking status: Former    Packs/day: 1.00    Years: 15.00    Total pack years: 15.00    Types: Cigarettes    Quit date: 07/09/1982    Years since quitting: 39.9   Smokeless tobacco: Never  Substance and Sexual Activity   Alcohol use: No   Drug use: No   Sexual activity: Yes    Partners: Female  Other Topics Concern   Not on file  Social History Narrative   Marital status: married x 55 years; happily married.   Lives: with wife       Children:  4 children; 4 grandchildren. Adult children all live locally.          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      Tobacco:  In past; smoked x 15 years; quit 35 years ago.      Alcohol: none      ADLs: independent with all ADLs.  Does not use assistant device with ambulation.      Living Will:  Has living will; desires FULL CODE; no prolonged measures.      Doctoral degree in Biblical Studies.          Diet: eats twice daily, eggs,sausage,and fruits for breakfast; chicken (fried or broil), occassional pizza, salads, steak and pork chops.        Exercise: cardiac rehab, transition to [facility] - 45 minutes walks and resistance 5x/week   Social Determinants of Health   Financial Resource Strain: Low Risk  (06/24/2022)   Overall Financial Resource Strain (CARDIA)    Difficulty of Paying Living Expenses: Not hard at all  Food Insecurity: No Food Insecurity (06/24/2022)   Hunger Vital Sign    Worried About Running Out of Food in the Last Year: Never true    Ran Out of Food in the Last Year: Never true  Transportation Needs: No  Transportation Needs (06/24/2022)   PRAPARE - Hydrologist (Medical): No    Lack of Transportation (Non-Medical): No  Physical Activity: Sufficiently Active (06/24/2022)   Exercise Vital Sign    Days of Exercise per Week: 5 days    Minutes of Exercise per Session: 60 min  Stress: No Stress Concern Present (06/24/2022)   [place]    Feeling of Stress : Not at all  Social Connections: [place] (06/24/2022)   Social Connection and Isolation Panel [NHANES]    Frequency of Communication with Friends and Family: More than three times a week    Frequency of Social Gatherings with Friends and Family: More than three times a week    Attends Religious Services: More than 4 times per year    Active Member of [employer] or Organizations: Yes    Attends Music therapist: More than 4 times per year    Marital Status: Married    Tobacco Counseling Counseling given: Not Answered   Clinical Intake:  Pre-visit preparation completed: Yes  Pain : No/denies pain     Nutritional Risks: None Diabetes: No  How often do you need to have someone help you when you read instructions, pamphlets, or other written materials from your doctor or pharmacy?: 1 - Never  Diabetic?Yes  Nutrition Risk Assessment:  Has the patient had any N/V/D within the last 2 months?  No  Does the patient have any non-healing wounds?  No  Has the patient had any unintentional weight loss or weight gain?  No   Diabetes:  Is the patient diabetic?  Yes  If diabetic, was a CBG obtained today?  No  Did the patient bring in their glucometer from home?  No  How often do you monitor your CBG's? 2 x dy .   Financial Strains and Diabetes Management:  Are you having any financial strains with the device, your supplies or your medication? No .  Does the patient want to be seen by Chronic Care Management for  management of their diabetes?  No  Would the patient like to be referred to a Nutritionist or for Diabetic Management?  No   Diabetic Exams:  Diabetic Eye Exam: Completed 08/2021 Diabetic Foot Exam: Overdue, Pt has been advised about the importance in completing this exam. Pt is scheduled for diabetic foot exam on next office visit .   Interpreter Needed?: No  Information entered by :: Jadene Pierini, LPN   Activities of Daily Living    06/24/2022    8:52 AM  In your present state of health, do you have any difficulty performing the following activities:  Hearing? 0  Vision? 0  Difficulty concentrating or making decisions? 0  Walking or climbing stairs? 0  Dressing or bathing? 0  Doing errands, shopping? 0  Preparing Food and eating ? N  Using the Toilet? N  In the past six months, have you accidently leaked urine? N  Do you have problems with loss of bowel control? N  Managing your Medications? N  Managing your Finances? N  Housekeeping or managing your Housekeeping? N    Patient Care Team: Horald Pollen, MD as PCP - General (Internal Medicine) Leonie Man, MD as PCP - Cardiology (Cardiology)  Indicate any recent Medical Services you may have received from other than [facility] providers in the past year (date may be approximate).     Assessment:   This is a routine wellness examination for Gregory Mathews.  Hearing/Vision screen Vision Screening - Comments:: Annual eye exams   Dietary issues and exercise activities discussed: Current Exercise Habits: Home exercise routine, Type of exercise: walking, Time (Minutes): 60, Frequency (Times/Week): 5, Weekly Exercise (Minutes/Week): 300, Exercise limited by: None identified   Goals Addressed             This Visit's Progress    DIET - INCREASE WATER INTAKE         Depression Screen    06/24/2022    8:52 AM 06/24/2022    8:51 AM 08/13/2021    8:14 AM 06/10/2021    8:40 AM 02/15/2021    3:30 PM 11/16/2020    8:26  AM 07/24/2020    2:27 PM  PHQ 2/9 Scores  PHQ - 2 Score 0 0 0 0 0 0 0  PHQ- 9 Score    0       Fall Risk    06/24/2022    8:50 AM 08/13/2021    8:14 AM 06/10/2021    8:39 AM 02/15/2021    3:30 PM 11/16/2020    8:26 AM  Fall Risk   Falls in the past year? 0 0 0 0 0  Number falls in past yr: 0 0 0 0   Injury with Fall? 0 0 0 0   Risk for fall due to : No Fall Risks  No Fall Risks    Follow up Falls prevention  discussed  Falls evaluation completed  Falls evaluation completed    FALL RISK PREVENTION PERTAINING TO THE HOME:  Any stairs in or around the home? Yes  If so, are there any without handrails? No  Home free of loose throw rugs in walkways, pet beds, electrical cords, etc? Yes  Adequate lighting in your home to reduce risk of falls? Yes   ASSISTIVE DEVICES UTILIZED TO PREVENT FALLS:  Life alert? No  Use of a cane, walker or w/c? No  Grab bars in the bathroom? No  Shower chair or bench in shower? No  Elevated toilet seat or a handicapped toilet? No          06/24/2022    8:53 AM 06/10/2021    8:40 AM 01/21/2020   11:10 AM  6CIT Screen  What Year? 0 points 0 points 0 points  What month? 0 points 0 points 0 points  What time? 0 points 0 points 0 points  Count back from 20 0 points 0 points 0 points  Months in reverse 0 points 0 points 0 points  Repeat phrase 0 points 2 points 0 points  Total Score 0 points 2 points 0 points    Immunizations Immunization History  Administered Date(s) Administered   Fluad Quad(high Dose 65+) 07/08/2019, 07/13/2020, 05/24/2021   Influenza Split 07/09/2012   Influenza, High Dose Seasonal PF 06/12/2018   Influenza,inj,Quad PF,6+ Mos 07/20/2013, 07/19/2014, 08/01/2015, 08/14/2016, 09/10/2017   Influenza,trivalent, recombinat, inj, PF 06/13/2011   Pneumococcal Conjugate-13 08/22/2014   Pneumococcal Polysaccharide-23 07/09/2012, 03/11/2018   Tdap 06/13/2011, 08/01/2015   Zoster Recombinat (Shingrix) 05/24/2021, 08/13/2021   Zoster,  Live 09/23/2005, 02/10/2012    TDAP status: Up to date  Flu Vaccine status: Due, Education has been provided regarding the importance of this vaccine. Advised may receive this vaccine at local pharmacy or Health Dept. Aware to provide a copy of the vaccination record if obtained from local pharmacy or Health Dept. Verbalized acceptance and understanding.  Pneumococcal vaccine status: Up to date  Covid-19 vaccine status: Completed vaccines  Qualifies for Shingles Vaccine? Yes   Zostavax completed No   Shingrix Completed?: Yes  Screening Tests Health Maintenance  Topic Date Due   COVID-19 Vaccine (1) Never done   FOOT EXAM  11/17/2020   INFLUENZA VACCINE  04/23/2022   HEMOGLOBIN A1C  09/03/2022   COLONOSCOPY (Pts 45-45yr Insurance coverage will need to be confirmed)  10/08/2022   OPHTHALMOLOGY EXAM  11/22/2022   Diabetic kidney evaluation - GFR measurement  03/05/2023   Diabetic kidney evaluation - Urine ACR  03/05/2023   TETANUS/TDAP  07/31/2025   Pneumonia Vaccine 76 Years old  Completed   Hepatitis C Screening  Completed   Zoster Vaccines- Shingrix  Completed   HPV VACCINES  Aged Out    Health Maintenance  Health Maintenance Due  Topic Date Due   COVID-19 Vaccine (1) Never done   FOOT EXAM  11/17/2020   INFLUENZA VACCINE  04/23/2022    Colorectal cancer screening: Type of screening: Colonoscopy. Completed 10/08/2021. Repeat every 1 years  Lung Cancer Screening: (Low Dose CT Chest recommended if Age 74-80years, 30 pack-year currently smoking OR have quit w/in 15years.) does not qualify.   Lung Cancer Screening Referral: n/a  Additional Screening:  Hepatitis C Screening: does not qualify;   Vision Screening: Recommended annual ophthalmology exams for early detection of glaucoma and other disorders of the eye. Is the patient up to date with their annual eye exam?  Yes  Who  is the provider or what is the name of the office in which the patient attends annual eye  exams? Dr.Tanner  If pt is not established with a provider, would they like to be referred to a provider to establish care? No .   Dental Screening: Recommended annual dental exams for proper oral hygiene  Community Resource Referral / Chronic Care Management: CRR required this visit?  No   CCM required this visit?  No      Plan:     I have personally reviewed and noted the following in the patient's chart:   Medical and social history Use of alcohol, tobacco or illicit drugs  Current medications and supplements including opioid prescriptions. Patient is not currently taking opioid prescriptions. Functional ability and status Nutritional status Physical activity Advanced directives List of other physicians Hospitalizations, surgeries, and ER visits in previous 12 months Vitals Screenings to include cognitive, depression, and falls Referrals and appointments  In addition, I have reviewed and discussed with patient certain preventive protocols, quality metrics, and best practice recommendations. A written personalized care plan for preventive services as well as general preventive health recommendations were provided to patient.     Daphane Shepherd, LPN   [id number]   Nurse Notes: Due Flu Vaccine

## 2022-06-24 NOTE — Assessment & Plan Note (Signed)
Amoxicillin prescribed. Counseling on symptomatic management. Follow up if worsening or not back to baseline after completing the antibiotic.

## 2022-06-24 NOTE — Assessment & Plan Note (Signed)
Suspect fungal. No sign of bacterial infection at this point. Topical Lotrisone prescribed. Recommend keeping the area clean and dry. Avoid sitting in wet swim trunks.  Follow up if not improving.

## 2022-06-24 NOTE — Patient Instructions (Signed)
Gregory Mathews , Thank you for taking time to come for your Medicare Wellness Visit. I appreciate your ongoing commitment to your health goals. Please review the following plan we discussed and let me know if I can assist you in the future.   These are the goals we discussed:  Goals      DIET - INCREASE WATER INTAKE     Patient Stated     Patient would like to get his medication down.  Glipizide  Keep his cholesterol were it is.          This is a list of the screening recommended for you and due dates:  Health Maintenance  Topic Date Due   COVID-19 Vaccine (1) Never done   Complete foot exam   11/17/2020   Flu Shot  04/23/2022   Hemoglobin A1C  09/03/2022   Colon Cancer Screening  10/08/2022   Eye exam for diabetics  11/22/2022   Yearly kidney function blood test for diabetes  03/05/2023   Yearly kidney health urinalysis for diabetes  03/05/2023   Tetanus Vaccine  07/31/2025   Pneumonia Vaccine  Completed   Hepatitis C Screening: USPSTF Recommendation to screen - Ages 74-79 yo.  Completed   Zoster (Shingles) Vaccine  Completed   HPV Vaccine  Aged Out    Advanced directives: Please bring a copy of your health care power of attorney and living will to the office to be added to your chart at your convenience.   Conditions/risks identified: Aim for 30 minutes of exercise or brisk walking, 6-8 glasses of water, and 5 servings of fruits and vegetables each day.   Next appointment: Follow up in one year for your annual wellness visit.   Preventive Care 74 Years and Older, Male  Preventive care refers to lifestyle choices and visits with your health care provider that can promote health and wellness. What does preventive care include? A yearly physical exam. This is also called an annual well check. Dental exams once or twice a year. Routine eye exams. Ask your health care provider how often you should have your eyes checked. Personal lifestyle choices, including: Daily care of  your teeth and gums. Regular physical activity. Eating a healthy diet. Avoiding tobacco and drug use. Limiting alcohol use. Practicing safe sex. Taking low doses of aspirin every day. Taking vitamin and mineral supplements as recommended by your health care provider. What happens during an annual well check? The services and screenings done by your health care provider during your annual well check will depend on your age, overall health, lifestyle risk factors, and family history of disease. Counseling  Your health care provider may ask you questions about your: Alcohol use. Tobacco use. Drug use. Emotional well-being. Home and relationship well-being. Sexual activity. Eating habits. History of falls. Memory and ability to understand (cognition). Work and work Statistician. Screening  You may have the following tests or measurements: Height, weight, and BMI. Blood pressure. Lipid and cholesterol levels. These may be checked every 5 years, or more frequently if you are over 67 years old. Skin check. Lung cancer screening. You may have this screening every year starting at age 17 if you have a 30-pack-year history of smoking and currently smoke or have quit within the past 15 years. Fecal occult blood test (FOBT) of the stool. You may have this test every year starting at age 70. Flexible sigmoidoscopy or colonoscopy. You may have a sigmoidoscopy every 5 years or a colonoscopy every 10 years starting  at age 81. Prostate cancer screening. Recommendations will vary depending on your family history and other risks. Hepatitis C blood test. Hepatitis B blood test. Sexually transmitted disease (STD) testing. Diabetes screening. This is done by checking your blood sugar (glucose) after you have not eaten for a while (fasting). You may have this done every 1-3 years. Abdominal aortic aneurysm (AAA) screening. You may need this if you are a current or former smoker. Osteoporosis. You may be  screened starting at age 14 if you are at high risk. Talk with your health care provider about your test results, treatment options, and if necessary, the need for more tests. Vaccines  Your health care provider may recommend certain vaccines, such as: Influenza vaccine. This is recommended every year. Tetanus, diphtheria, and acellular pertussis (Tdap, Td) vaccine. You may need a Td booster every 10 years. Zoster vaccine. You may need this after age 33. Pneumococcal 13-valent conjugate (PCV13) vaccine. One dose is recommended after age 69. Pneumococcal polysaccharide (PPSV23) vaccine. One dose is recommended after age 16. Talk to your health care provider about which screenings and vaccines you need and how often you need them. This information is not intended to replace advice given to you by your health care provider. Make sure you discuss any questions you have with your health care provider. Document Released: 10/06/2015 Document Revised: 05/29/2016 Document Reviewed: 07/11/2015 Elsevier Interactive Patient Education  2017 [place] Prevention in the Home Falls can cause injuries. They can happen to people of all ages. There are many things you can do to make your home safe and to help prevent falls. What can I do on the outside of my home? Regularly fix the edges of walkways and driveways and fix any cracks. Remove anything that might make you trip as you walk through a door, such as a raised step or threshold. Trim any bushes or trees on the path to your home. Use bright outdoor lighting. Clear any walking paths of anything that might make someone trip, such as rocks or tools. Regularly check to see if handrails are loose or broken. Make sure that both sides of any steps have handrails. Any raised decks and porches should have guardrails on the edges. Have any leaves, snow, or ice cleared regularly. Use sand or salt on walking paths during winter. Clean up any spills in  your garage right away. This includes oil or grease spills. What can I do in the bathroom? Use night lights. Install grab bars by the toilet and in the tub and shower. Do not use towel bars as grab bars. Use non-skid mats or decals in the tub or shower. If you need to sit down in the shower, use a plastic, non-slip stool. Keep the floor dry. Clean up any water that spills on the floor as soon as it happens. Remove soap buildup in the tub or shower regularly. Attach bath mats securely with double-sided non-slip rug tape. Do not have throw rugs and other things on the floor that can make you trip. What can I do in the bedroom? Use night lights. Make sure that you have a light by your bed that is easy to reach. Do not use any sheets or blankets that are too big for your bed. They should not hang down onto the floor. Have a firm chair that has side arms. You can use this for support while you get dressed. Do not have throw rugs and other things on the floor that can  make you trip. What can I do in the kitchen? Clean up any spills right away. Avoid walking on wet floors. Keep items that you use a lot in easy-to-reach places. If you need to reach something above you, use a strong step stool that has a grab bar. Keep electrical cords out of the way. Do not use floor polish or wax that makes floors slippery. If you must use wax, use non-skid floor wax. Do not have throw rugs and other things on the floor that can make you trip. What can I do with my stairs? Do not leave any items on the stairs. Make sure that there are handrails on both sides of the stairs and use them. Fix handrails that are broken or loose. Make sure that handrails are as long as the stairways. Check any carpeting to make sure that it is firmly attached to the stairs. Fix any carpet that is loose or worn. Avoid having throw rugs at the top or bottom of the stairs. If you do have throw rugs, attach them to the floor with carpet  tape. Make sure that you have a light switch at the top of the stairs and the bottom of the stairs. If you do not have them, ask someone to add them for you. What else can I do to help prevent falls? Wear shoes that: Do not have high heels. Have rubber bottoms. Are comfortable and fit you well. Are closed at the toe. Do not wear sandals. If you use a stepladder: Make sure that it is fully opened. Do not climb a closed stepladder. Make sure that both sides of the stepladder are locked into place. Ask someone to hold it for you, if possible. Clearly mark and make sure that you can see: Any grab bars or handrails. First and last steps. Where the edge of each step is. Use tools that help you move around (mobility aids) if they are needed. These include: Canes. Walkers. Scooters. Crutches. Turn on the lights when you go into a dark area. Replace any light bulbs as soon as they burn out. Set up your furniture so you have a clear path. Avoid moving your furniture around. If any of your floors are uneven, fix them. If there are any pets around you, be aware of where they are. Review your medicines with your doctor. Some medicines can make you feel dizzy. This can increase your chance of falling. Ask your doctor what other things that you can do to help prevent falls. This information is not intended to replace advice given to you by your health care provider. Make sure you discuss any questions you have with your health care provider. Document Released: 07/06/2009 Document Revised: 02/15/2016 Document Reviewed: 10/14/2014 Elsevier Interactive Patient Education  2017 [employer].

## 2022-06-24 NOTE — Progress Notes (Signed)
Subjective:     Patient ID: Gregory Roller., male    DOB: 06-20-1946, 76 y.o.   MRN: [id number]  Chief Complaint  Patient presents with   Sinus Problem    And sore throat been getting worse since Friday  Expel some mucus dark yellowish in colour     Sinus Problem   Patient is in today for a 4 day hx of sore throat, sinus pain, low grade fever, purulent nasal drainage and cough.   Denies fever, chills, dizziness, chest pain, palpitations, shortness of breath, abdominal pain, N/V/D, urinary symptoms, LE edema.   He also c/o a rash of left groin x 1 wk after swimming and sitting in wet swim trunks. Area is pruritic. He has been using hydrogen peroxide and Gold Bold.   Health Maintenance Due  Topic Date Due   COVID-19 Vaccine (1) Never done   FOOT EXAM  11/17/2020   INFLUENZA VACCINE  04/23/2022    Past Medical History:  Diagnosis Date   Allergy    Basal cell carcinoma 01/2013   Nasal and R forearm.  [facility]   CAD S/P percutaneous coronary angioplasty    2/19 PCI/DESx1 to mLAD, FFR 0.7 -Sierra DES 2.75 mm x 18 mm   Cataract    Diabetes mellitus without complication (HCC)    on PO Meds   Essential hypertension    Family history of colon cancer    Family history of malignant neoplasm of breast    Heart murmur    Hyperlipidemia    statin intolerant -- myalgias, fatigue (tried at least 4)   Polyposis of colon    Rosacea     Past Surgical History:  Procedure Laterality Date   basal cell removal     nose and wrist    BELPHAROPTOSIS REPAIR     CORONARY CT ANGIOGRAM  09/2017   Coronary Calcium Score: 111. Coronary calcification noted in the LEFT MAIN (LM) and prox LAD.  LM < 50% calcified stenosis.  Mid LAD 50-75% plaque noted CT FFR --> positive at 0.80.  Recommend CARDIAC CATHETERIZATION   CORONARY STENT INTERVENTION N/A 11/12/2017   Procedure: CORONARY STENT INTERVENTION;  Surgeon: Leonie Man, MD;  Location: [place] [facility];  Service:  Cardiovascular:  DES PCI:  STENT SIERRA 2.75 X 18 MM - Post intervention, there is a 0% residual stenosis.   ETT/GXT: Graded Exercise Tolerance Test  08/2015    Exercised for 9:01 min --> blunted blood pressure response no EKG changes.  No chest pain.  Low Risk   EYE SURGERY     Cataract surgery   eyelid surgery     INTRAVASCULAR PRESSURE WIRE/FFR STUDY N/A 11/12/2017   Procedure: INTRAVASCULAR PRESSURE WIRE/FFR STUDY;  Surgeon: Leonie Man, MD;  Location: [place] [facility];  Service: Cardiovascular;  Laterality: LAD -FFR 0.76   LEFT HEART CATH AND CORONARY ANGIOGRAPHY N/A 11/12/2017   Procedure: LEFT HEART CATH AND CORONARY ANGIOGRAPHY;  Surgeon: Leonie Man, MD;  Location: [facility]: 70% P-M LAD (FFR 0.76).  40% mid LAD, 50% ostial ramus.  EF 55-60%.  Mildly elevated LVEDP.   SHOULDER OPEN ROTATOR CUFF REPAIR  1983   TRANSTHORACIC ECHOCARDIOGRAM  08/2017   EF 55-60%.  Mild aortic stenosis (mean gradient 11 mmH.)  GR 1 DD.  Mild LA dilation.    Family History  Problem Relation Age of Onset   Leukemia Mother 6       d. 29  Stroke Father 77       as a complication of CABG   Colon cancer Father 13   Coronary artery disease Father 65       - Sx was DOE - MV CAD   Peripheral Artery Disease Father        presumptive   Diabetes Sister    Sudden Cardiac Death Sister 68       presumed MI   Heart attack Brother 67       During throat surgery   Congestive Heart Failure Brother        Died @ 56 - ? CHF / ICM   Breast cancer Maternal Grandmother        dx in her 5s   Stroke Maternal Grandfather    Diabetes Brother 72       on lots of meds   Diabetes Brother    Diabetes Paternal Grandmother    Rheumatic fever Brother        childhood RF --> Valvular Dz - valve Sgx,  died young   Stomach cancer Neg Hx    Esophageal cancer Neg Hx     Social History   Socioeconomic History   Marital status: Married    Spouse name: Gregory Mathews   Number of children: 4   Years of  education: 20   Highest education level: Not on file  Occupational History   Occupation: bus Engineer, manufacturing systems: [place]  Tobacco Use   Smoking status: Former    Packs/day: 1.00    Years: 15.00    Total pack years: 15.00    Types: Cigarettes    Quit date: 07/09/1982    Years since quitting: 39.9   Smokeless tobacco: Never  Substance and Sexual Activity   Alcohol use: No   Drug use: No   Sexual activity: Yes    Partners: Female  Other Topics Concern   Not on file  Social History Narrative   Marital status: married x 61 years; happily married.   Lives: with wife       Children:  4 children; 4 grandchildren. Adult children all live locally.          Employment: retired from bus transportation; PRN driving now activity bus. [employer].  Chicken farming.      Tobacco:  In past; smoked x 15 years; quit 35 years ago.      Alcohol: none      ADLs: independent with all ADLs.  Does not use assistant device with ambulation.      Living Will:  Has living will; desires FULL CODE; no prolonged measures.      Doctoral degree in Biblical Studies.          Diet: eats twice daily, eggs,sausage,and fruits for breakfast; chicken (fried or broil), occassional pizza, salads, steak and pork chops.        Exercise: cardiac rehab, transition to [facility] - 45 minutes walks and resistance 5x/week   Social Determinants of Health   Financial Resource Strain: Low Risk  (06/24/2022)   Overall Financial Resource Strain (CARDIA)    Difficulty of Paying Living Expenses: Not hard at all  Food Insecurity: No Food Insecurity (06/24/2022)   Hunger Vital Sign    Worried About Running Out of Food in the Last Year: Never true    Ran Out of Food in the Last Year: Never true  Transportation Needs: No Transportation Needs (06/24/2022)   PRAPARE - Transportation  Lack of Transportation (Medical): No    Lack of Transportation (Non-Medical): No  Physical Activity: Sufficiently  Active (06/24/2022)   Exercise Vital Sign    Days of Exercise per Week: 5 days    Minutes of Exercise per Session: 60 min  Stress: No Stress Concern Present (06/24/2022)   [place]    Feeling of Stress : Not at all  Social Connections: [place] (06/24/2022)   Social Connection and Isolation Panel [NHANES]    Frequency of Communication with Friends and Family: More than three times a week    Frequency of Social Gatherings with Friends and Family: More than three times a week    Attends Religious Services: More than 4 times per year    Active Member of [employer] or Organizations: Yes    Attends Music therapist: More than 4 times per year    Marital Status: Married  Human resources officer Violence: Not At Risk (06/24/2022)   Humiliation, Afraid, Rape, and Kick questionnaire    Fear of Current or Ex-Partner: No    Emotionally Abused: No    Physically Abused: No    Sexually Abused: No    Outpatient Medications Prior to Visit  Medication Sig Dispense Refill   blood glucose meter kit and supplies Use to test blood sugar daily. Dx: E11.9 1 each 0   clopidogrel (PLAVIX) 75 MG tablet Take 1 tablet (75 mg total) by mouth daily. Annual appt due in Nov must see provider for future refills 90 tablet 2   ezetimibe (ZETIA) 10 MG tablet TAKE 1 TABLET BY MOUTH EVERY DAY 90 tablet 1   glipiZIDE (GLUCOTROL) 5 MG tablet Take 1 tablet (5 mg total) by mouth 2 (two) times daily before a meal. 180 tablet 3   Lancets MISC Use to test blood sugar daily. Dx code :E11.9 100 each 3   losartan (COZAAR) 50 MG tablet Take 1 tablet (50 mg total) by mouth daily. 90 tablet 3   metoprolol succinate (TOPROL-XL) 25 MG 24 hr tablet TAKE 1 TABLET (25 MG TOTAL) BY MOUTH DAILY. 90 tablet 3   Multiple Vitamin (MULTIVITAMIN) tablet Take 1 tablet by mouth daily.     ONETOUCH ULTRA test strip USE TO TEST BLOOD SUGAR ONCE DAILY 100 strip 11   phenol  (CHLORASEPTIC GARGLE) 1.4 % LIQD Use as directed 1 spray in the mouth or throat as needed for throat irritation / pain. (Patient not taking: Reported on 06/24/2022) 20 mL 0   No facility-administered medications prior to visit.    Allergies  Allergen Reactions   Januvia [Sitagliptin] Other (See Comments)    Gi upset and kidney problems   Lisinopril Cough   Repatha [Evolocumab] Other (See Comments)    Myalgias    Statins Other (See Comments)    Severe leg pain & fatigue -- tried at least 4 (even low dose)    ROS     Objective:    Physical Exam Constitutional:      General: He is not in acute distress.    Appearance: He is not ill-appearing.  HENT:     Right Ear: Tympanic membrane and ear canal normal.     Left Ear: Tympanic membrane and ear canal normal.     Nose: Congestion present.     Right Sinus: Maxillary sinus tenderness present.     Left Sinus: Maxillary sinus tenderness present.     Mouth/Throat:     Mouth: Mucous membranes are  moist.     Pharynx: Posterior oropharyngeal erythema present. No oropharyngeal exudate or uvula swelling.  Eyes:     Conjunctiva/sclera: Conjunctivae normal.  Cardiovascular:     Rate and Rhythm: Normal rate and regular rhythm.  Pulmonary:     Effort: Pulmonary effort is normal.     Breath sounds: Normal breath sounds.  Musculoskeletal:     Cervical back: Normal range of motion and neck supple. No tenderness.  Lymphadenopathy:     Cervical: No cervical adenopathy.  Skin:    General: Skin is warm and dry.     Findings: Rash present.          Comments: Pruritic beefy red rash to fold of left groin. No maceration or sign of secondary bacterial infection   Neurological:     General: No focal deficit present.     Mental Status: He is alert and oriented to person, place, and time.  Psychiatric:        Mood and Affect: Mood normal.        Behavior: Behavior normal.     BP 124/72   Pulse 79   Temp 98.5 F (36.9 C) (Oral)   Ht '5\' 9"'   (1.753 m)   Wt 213 lb 8 oz (96.8 kg)   SpO2 96%   BMI 31.53 kg/m  Wt Readings from Last 3 Encounters:  06/24/22 213 lb 8 oz (96.8 kg)  06/24/22 210 lb (95.3 kg)  06/04/22 214 lb (97.1 kg)       Assessment & Plan:   Problem List Items Addressed This Visit       Respiratory   Acute non-recurrent pansinusitis - Primary    Amoxicillin prescribed. Counseling on symptomatic management. Follow up if worsening or not back to baseline after completing the antibiotic.       Relevant Medications   chlorpheniramine-HYDROcodone (TUSSIONEX) 10-8 MG/5ML   amoxicillin (AMOXIL) 875 MG tablet     Musculoskeletal and Integument   Rash of groin    Suspect fungal. No sign of bacterial infection at this point. Topical Lotrisone prescribed. Recommend keeping the area clean and dry. Avoid sitting in wet swim trunks.  Follow up if not improving.       Relevant Medications   clotrimazole-betamethasone (LOTRISONE) cream   Other Visit Diagnoses     Sore throat       Relevant Orders   POC COVID-19 (Completed)   Acute pharyngitis, unspecified etiology       Acute cough       Relevant Medications   chlorpheniramine-HYDROcodone (TUSSIONEX) 10-8 MG/5ML      Tussionex prescribed per patient request.  He has been prescribed this in the past by his PCP and it works well.  Reviewed PDMP.  Counseling on using caution due to the medication being sedating and recommend taking it at bedtime.  I am having Gregory Magnone Jr. start on chlorpheniramine-HYDROcodone, amoxicillin, and clotrimazole-betamethasone. I am also having him maintain his blood glucose meter kit and supplies, Lancets, multivitamin, OneTouch Ultra, metoprolol succinate, Chloraseptic Gargle, clopidogrel, ezetimibe, glipiZIDE, and losartan.  Meds ordered this encounter  Medications   chlorpheniramine-HYDROcodone (TUSSIONEX) 10-8 MG/5ML    Sig: Take 5 mLs by mouth at bedtime as needed for cough.    Dispense:  115 mL    Refill:  0     Order Specific Question:   Supervising Provider    Answer:   Pricilla Holm A [[id number]]   amoxicillin (AMOXIL) 875 MG tablet    Sig: Take 1  tablet (875 mg total) by mouth 2 (two) times daily for 10 days.    Dispense:  20 tablet    Refill:  0    Order Specific Question:   Supervising Provider    Answer:   Pricilla Holm A [[id number]]   clotrimazole-betamethasone (LOTRISONE) cream    Sig: Apply 1 Application topically daily.    Dispense:  30 g    Refill:  0    Order Specific Question:   Supervising Provider    Answer:   Pricilla Holm A [[id number]]

## 2022-07-16 NOTE — Telephone Encounter (Signed)
RN spoke with patient on 10/3 and he declined treatment due to possible side effects.  Patient was advise to reach out to his provider. Thanks [name]

## 2022-08-26 ENCOUNTER — Other Ambulatory Visit: Payer: Self-pay | Admitting: Emergency Medicine

## 2022-08-26 DIAGNOSIS — E785 Hyperlipidemia, unspecified: Secondary | ICD-10-CM

## 2022-09-05 ENCOUNTER — Ambulatory Visit: Payer: Medicare Other | Admitting: Emergency Medicine

## 2022-09-09 ENCOUNTER — Ambulatory Visit (INDEPENDENT_AMBULATORY_CARE_PROVIDER_SITE_OTHER): Payer: Medicare Other | Admitting: Emergency Medicine

## 2022-09-09 ENCOUNTER — Encounter: Payer: Self-pay | Admitting: Emergency Medicine

## 2022-09-09 VITALS — BP 124/76 | HR 62 | Temp 98.2°F | Ht 69.0 in | Wt 211.4 lb

## 2022-09-09 DIAGNOSIS — I25119 Atherosclerotic heart disease of native coronary artery with unspecified angina pectoris: Secondary | ICD-10-CM | POA: Diagnosis not present

## 2022-09-09 DIAGNOSIS — Z23 Encounter for immunization: Secondary | ICD-10-CM

## 2022-09-09 DIAGNOSIS — E785 Hyperlipidemia, unspecified: Secondary | ICD-10-CM

## 2022-09-09 DIAGNOSIS — E1169 Type 2 diabetes mellitus with other specified complication: Secondary | ICD-10-CM | POA: Diagnosis not present

## 2022-09-09 DIAGNOSIS — R21 Rash and other nonspecific skin eruption: Secondary | ICD-10-CM | POA: Diagnosis not present

## 2022-09-09 DIAGNOSIS — I152 Hypertension secondary to endocrine disorders: Secondary | ICD-10-CM | POA: Diagnosis not present

## 2022-09-09 DIAGNOSIS — E1159 Type 2 diabetes mellitus with other circulatory complications: Secondary | ICD-10-CM

## 2022-09-09 LAB — COMPREHENSIVE METABOLIC PANEL
ALT: 21 U/L (ref 0–53)
AST: 20 U/L (ref 0–37)
Albumin: 4.6 g/dL (ref 3.5–5.2)
Alkaline Phosphatase: 57 U/L (ref 39–117)
BUN: 18 mg/dL (ref 6–23)
CO2: 26 mEq/L (ref 19–32)
Calcium: 10.5 mg/dL (ref 8.4–10.5)
Chloride: 104 mEq/L (ref 96–112)
Creatinine, Ser: 0.9 mg/dL (ref 0.40–1.50)
GFR: 84.05 mL/min (ref >60.00)
Glucose, Bld: 97 mg/dL (ref 70–99)
Potassium: 4 mEq/L (ref 3.5–5.1)
Sodium: 141 mEq/L (ref 135–145)
Total Bilirubin: 0.6 mg/dL (ref 0.2–1.2)
Total Protein: 7.9 g/dL (ref 6.0–8.3)

## 2022-09-09 LAB — LIPID PANEL
Cholesterol: 160 mg/dL (ref 0–200)
HDL: 40.9 mg/dL (ref >39.00)
LDL Cholesterol: 93 mg/dL (ref 0–99)
NonHDL: 119.04
Total CHOL/HDL Ratio: 4
Triglycerides: 130 mg/dL (ref 0.0–149.0)
VLDL: 26 mg/dL (ref 0.0–40.0)

## 2022-09-09 LAB — POCT GLYCOSYLATED HEMOGLOBIN (HGB A1C): Hemoglobin A1C: 6.9 % — AB (ref 4.0–5.6)

## 2022-09-09 MED ORDER — CLOTRIMAZOLE-BETAMETHASONE 1-0.05 % EX CREA: 1.0000 | TOPICAL_CREAM | Freq: Every day | CUTANEOUS | 0 refills | Status: DC

## 2022-09-09 NOTE — Progress Notes (Signed)
Gregory Mathews. 76 y.o.   Chief Complaint  Patient presents with   Follow-up    60mth f/u appt, no concerns     HISTORY OF PRESENT ILLNESS: This is a 76y.o. male here for 639-monthollow-up of hypertension and diabetes. Overall doing well.  Has no complaints or medical concerns today.  HPI   Prior to Admission medications   Medication Sig Start Date End Date Taking? Authorizing Provider  blood glucose meter kit and supplies Use to test blood sugar daily. Dx: E11.9 10/11/15  Yes ClTereasa CoopPA-C  clopidogrel (PLAVIX) 75 MG tablet Take 1 tablet (75 mg total) by mouth daily. Annual appt due in Nov must see provider for future refills 01/25/22  Yes Evalyn Shultis, MiInes BloomerMD  clotrimazole-betamethasone (LOTRISONE) cream Apply 1 Application topically daily. 06/24/22  Yes Henson, Vickie L, NP-C  glipiZIDE (GLUCOTROL) 5 MG tablet Take 1 tablet (5 mg total) by mouth 2 (two) times daily before a meal. 03/04/22 02/27/23 Yes Joline Encalada, MiInes BloomerMD  Lancets MISC Use to test blood sugar daily. Dx code :E11.9 07/08/19  Yes Tyianna Menefee, MiInes BloomerMD  losartan (COZAAR) 50 MG tablet Take 1 tablet (50 mg total) by mouth daily. 03/04/22 02/27/23 Yes Grayden Burley, MiInes BloomerMD  metoprolol succinate (TOPROL-XL) 25 MG 24 hr tablet TAKE 1 TABLET (25 MG TOTAL) BY MOUTH DAILY. 01/06/22  Yes Meghin Thivierge, MiInes BloomerMD  Multiple Vitamin (MULTIVITAMIN) tablet Take 1 tablet by mouth daily.   Yes [provider]  ONTenstrikeest strip USE TO TEST BLOOD SUGAR ONCE DAILY 10/04/21  Yes SaFremontMiInes BloomerMD    Allergies  Allergen Reactions   Januvia [Sitagliptin] Other (See Comments)    Gi upset and kidney problems   Lisinopril Cough   Repatha [Evolocumab] Other (See Comments)    Myalgias    Statins Other (See Comments)    Severe leg pain & fatigue -- tried at least 4 (even low dose)    Patient Active Problem List   Diagnosis Date Noted   Acute non-recurrent pansinusitis 06/24/2022   Rash of  groin 06/24/2022   CAD (coronary artery disease) 06/11/2022   Atherosclerosis of aorta (HCL[place]05/26/2022   Genetic testing 01/01/2021   Family history of colon cancer    Family history of malignant neoplasm of breast    Polyposis of colon    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   Clotting disorder (HCCataio08/25/2021   Skin cancer, basal cell 11/18/2019   Uncircumcised male 03/11/2018   Coronary artery disease involving native coronary artery of native heart with angina pectoris (HCNipinnawasee02/08/2018   Abnormal findings on diagnostic imaging of cardiovascular system 11/04/2017   Agatston coronary artery calcium score between 100 and 199 09/22/2017   Family history of early CAD 09/11/2017   Mild aortic stenosis by prior echocardiogram 1252/84/1324 Metabolic syndrome 1240/06/2724 Hypogonadism in male 08/14/2016   BMI 31.0-31.9,adult 03/13/2016   Hypertension associated with diabetes (HC[facility] Mound12/02/2015   Diverticulosis of colon without hemorrhage 08/01/2015   Statin intolerance 08/01/2015   Basal cell carcinoma of nose 08/23/2014   Rosacea 08/23/2014   Seasonal allergies 01/26/2013   Type 2 diabetes mellitus with hyperglycemia, without long-term current use of insulin (HCUnion Springs10/17/2013   Hyperlipidemia associated with type 2 diabetes mellitus (HCJewett City10/17/2013   Erectile dysfunction 07/09/2012    Past Medical History:  Diagnosis Date   Allergy    Basal cell carcinoma 01/2013   Nasal and R forearm.  [facility]  CAD S/P percutaneous coronary angioplasty    2/19 PCI/DESx1 to mLAD, FFR 0.7 -[place] DES 2.75 mm x 18 mm   Cataract    Diabetes mellitus without complication (HCC)    on PO Meds   Essential hypertension    Family history of colon cancer    Family history of malignant neoplasm of breast    Heart murmur    Hyperlipidemia    statin intolerant -- myalgias, fatigue (tried at least 4)   Polyposis of colon    Rosacea     Past Surgical History:  Procedure Laterality Date   basal  cell removal     nose and wrist    BELPHAROPTOSIS REPAIR     CORONARY CT ANGIOGRAM  09/2017   Coronary Calcium Score: 111. Coronary calcification noted in the LEFT MAIN (LM) and prox LAD.  LM < 50% calcified stenosis.  Mid LAD 50-75% plaque noted CT FFR --> positive at 0.80.  Recommend CARDIAC CATHETERIZATION   CORONARY STENT INTERVENTION N/A 11/12/2017   Procedure: CORONARY STENT INTERVENTION;  Surgeon: Leonie Man, MD;  Location: [place] [facility];  Service: Cardiovascular:  DES PCI:  STENT SIERRA 2.75 X 18 MM - Post intervention, there is a 0% residual stenosis.   ETT/GXT: Graded Exercise Tolerance Test  08/2015    Exercised for 9:01 min --> blunted blood pressure response no EKG changes.  No chest pain.  Low Risk   EYE SURGERY     Cataract surgery   eyelid surgery     INTRAVASCULAR PRESSURE WIRE/FFR STUDY N/A 11/12/2017   Procedure: INTRAVASCULAR PRESSURE WIRE/FFR STUDY;  Surgeon: Leonie Man, MD;  Location: [place] [facility];  Service: Cardiovascular;  Laterality: LAD -FFR 0.76   LEFT HEART CATH AND CORONARY ANGIOGRAPHY N/A 11/12/2017   Procedure: LEFT HEART CATH AND CORONARY ANGIOGRAPHY;  Surgeon: Leonie Man, MD;  [facility]: [facility]: 70% P-M LAD (FFR 0.76).  40% mid LAD, 50% ostial ramus.  EF 55-60%.  Mildly elevated LVEDP.   SHOULDER OPEN ROTATOR CUFF REPAIR  1983   TRANSTHORACIC ECHOCARDIOGRAM  08/2017   EF 55-60%.  Mild aortic stenosis (mean gradient 11 mmH.)  GR 1 DD.  Mild LA dilation.    Social History   Socioeconomic History   Marital status: Married    Spouse name: [name]   Number of children: 4   Years of education: 20   Highest education level: Not on file  Occupational History   Occupation: bus Engineer, manufacturing systems: [employer]  Tobacco Use   Smoking status: Former    Packs/day: 1.00    Years: 15.00    Total pack years: 15.00    Types: Cigarettes    Quit date: 07/09/1982    Years since quitting: 40.1   Smokeless  tobacco: Never  Substance and Sexual Activity   Alcohol use: No   Drug use: No   Sexual activity: Yes    Partners: Female  Other Topics Concern   Not on file  Social History Narrative   Marital status: married x 47 years; happily married.   Lives: with wife       Children:  4 children; 4 grandchildren. Adult children all live locally.          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  Chicken farming.      Tobacco:  In past; smoked x 15 years; quit 35 years ago.  Alcohol: none      ADLs: independent with all ADLs.  Does not use assistant device with ambulation.      Living Will:  Has living will; desires FULL CODE; no prolonged measures.      Doctoral degree in Biblical Studies.          Diet: eats twice daily, eggs,sausage,and fruits for breakfast; chicken (fried or broil), occassional pizza, salads, steak and pork chops.        Exercise: cardiac rehab, transition to [facility] - 45 minutes walks and resistance 5x/week   Social Determinants of Health   Financial Resource Strain: Low Risk  (06/24/2022)   Overall Financial Resource Strain (CARDIA)    Difficulty of Paying Living Expenses: Not hard at all  Food Insecurity: No Food Insecurity (06/24/2022)   Hunger Vital Sign    Worried About Running Out of Food in the Last Year: Never true    Ran Out of Food in the Last Year: Never true  Transportation Needs: No Transportation Needs (06/24/2022)   PRAPARE - Hydrologist (Medical): No    Lack of Transportation (Non-Medical): No  Physical Activity: Sufficiently Active (06/24/2022)   Exercise Vital Sign    Days of Exercise per Week: 5 days    Minutes of Exercise per Session: 60 min  Stress: No Stress Concern Present (06/24/2022)   [place]    Feeling of Stress : Not at all  Social Connections: [place] (06/24/2022)   Social  Connection and Isolation Panel [NHANES]    Frequency of Communication with Friends and Family: More than three times a week    Frequency of Social Gatherings with Friends and Family: More than three times a week    Attends Religious Services: More than 4 times per year    Active Member of [employer] or Organizations: Yes    Attends Music therapist: More than 4 times per year    Marital Status: Married  Human resources officer Violence: Not At Risk (06/24/2022)   Humiliation, Afraid, Rape, and Kick questionnaire    Fear of Current or Ex-Partner: No    Emotionally Abused: No    Physically Abused: No    Sexually Abused: No    Family History  Problem Relation Age of Onset   Leukemia Mother 33       d. 6   Stroke Father 27       as a complication of CABG   Colon cancer Father 40   Coronary artery disease Father 19       - Sx was DOE - MV CAD   Peripheral Artery Disease Father        presumptive   Diabetes Sister    Sudden Cardiac Death Sister 59       presumed MI   Heart attack Brother 81       During throat surgery   Congestive Heart Failure Brother        Died @ 35 - ? CHF / ICM   Breast cancer Maternal Grandmother        dx in her 60s   Stroke Maternal Grandfather    Diabetes Brother 27       on lots of meds   Diabetes Brother    Diabetes Paternal Grandmother    Rheumatic fever Brother        childhood RF --> Valvular Dz - valve Sgx,  died young   Stomach  cancer Neg Hx    Esophageal cancer Neg Hx      Review of Systems  Constitutional: Negative.  Negative for chills and fever.  HENT: Negative.  Negative for congestion and sore throat.   Respiratory: Negative.  Negative for cough and shortness of breath.   Cardiovascular: Negative.  Negative for chest pain and palpitations.  Gastrointestinal: Negative.  Negative for abdominal pain, nausea and vomiting.  Skin: Negative.  Negative for rash.  Neurological: Negative.  Negative for dizziness and headaches.  All other  systems reviewed and are negative.  Today's Vitals   09/09/22 0823  BP: 124/76  Pulse: 62  Temp: 98.2 F (36.8 C)  TempSrc: Oral  SpO2: 93%  Weight: 211 lb 6 oz (95.9 kg)  Height: _0  (1.753 m)   Body mass index is 31.21 kg/m. Wt Readings from Last 3 Encounters:  09/09/22 211 lb 6 oz (95.9 kg)  06/24/22 213 lb 8 oz (96.8 kg)  06/24/22 210 lb (95.3 kg)     Physical Exam Vitals reviewed.  Constitutional:      Appearance: Normal appearance.  HENT:     Head: Normocephalic.  Eyes:     Extraocular Movements: Extraocular movements intact.  Cardiovascular:     Rate and Rhythm: Normal rate.  Pulmonary:     Effort: Pulmonary effort is normal.  Skin:    General: Skin is warm and dry.  Neurological:     Mental Status: He is alert and oriented to person, place, and time.  Psychiatric:        Mood and Affect: Mood normal.        Behavior: Behavior normal.    Results for orders placed or performed in visit on 09/09/22 (from the past 24 hour(s))  POCT HgB A1C     Status: Abnormal   Collection Time: 09/09/22  9:28 AM  Result Value Ref Range   Hemoglobin A1C 6.9 (A) 4.0 - 5.6 %   HbA1c POC (<> result, manual entry)     HbA1c, POC (prediabetic range)     HbA1c, POC (controlled diabetic range)    Comprehensive metabolic panel     Status: None   Collection Time: 09/09/22  9:35 AM  Result Value Ref Range   Sodium 141 135 - 145 mEq/L   Potassium 4.0 3.5 - 5.1 mEq/L   Chloride 104 96 - 112 mEq/L   CO2 26 19 - 32 mEq/L   Glucose, Bld 97 70 - 99 mg/dL   BUN 18 6 - 23 mg/dL   Creatinine, Ser 0.90 0.40 - 1.50 mg/dL   Total Bilirubin 0.6 0.2 - 1.2 mg/dL   Alkaline Phosphatase 57 39 - 117 U/L   AST 20 0 - 37 U/L   ALT 21 0 - 53 U/L   Total Protein 7.9 6.0 - 8.3 g/dL   Albumin 4.6 3.5 - 5.2 g/dL   GFR 84.05 >60.00 mL/min   Calcium 10.5 8.4 - 10.5 mg/dL  Lipid panel     Status: None   Collection Time: 09/09/22  9:35 AM  Result Value Ref Range   Cholesterol 160 0 - 200 mg/dL    Triglycerides 130.0 0.0 - 149.0 mg/dL   HDL 40.90 >39.00 mg/dL   VLDL 26.0 0.0 - 40.0 mg/dL   LDL Cholesterol 93 0 - 99 mg/dL   Total CHOL/HDL Ratio 4    NonHDL 119.04      ASSESSMENT & PLAN: A total of 43 minutes was spent with the patient and counseling/coordination  of care regarding preparing for this visit, review of most recent office visit notes, review of most recent blood work results including interpretation of today's hemoglobin A1c, review of multiple chronic medical conditions under management, review of all medications, education on nutrition, prognosis, documentation, need for any follow-up.  Problem List Items Addressed This Visit       Cardiovascular and Mediastinum   Hypertension associated with diabetes ([place]) - Primary (Chronic)    Well-controlled hypertension. Continue losartan 50 mg daily and metoprolol succinate 25 mg daily. Well-controlled diabetes with hemoglobin A1c of 6.9. Continue glipizide 5 mg twice a day Cardiovascular risks associated with hypertension and diabetes discussed. Diet and nutrition discussed. Follow-up in 6 months.      Relevant Orders   POCT HgB A1C (Completed)   Comprehensive metabolic panel (Completed)   Coronary artery disease involving native coronary artery of native heart with angina pectoris ([place])    Stable.  No recent anginal episodes. Continue Plavix 75 mg daily.        Endocrine   Hyperlipidemia associated with type 2 diabetes mellitus (HCC) (Chronic)    Stable.  Diet and nutrition discussed. Intolerant to statins.      Relevant Orders   Lipid panel (Completed)     Musculoskeletal and Integument   RESOLVED: Rash of groin   Relevant Medications   clotrimazole-betamethasone (LOTRISONE) cream   Other Visit Diagnoses     Need for vaccination       Relevant Orders   Flu Vaccine QUAD High Dose(Fluad) (Completed)      Patient Instructions  Health Maintenance After Age 25 After age 70, you are at a higher risk  for certain long-term diseases and infections as well as injuries from falls. Falls are a major cause of broken bones and head injuries in people who are older than age 59. Getting regular preventive care can help to keep you healthy and well. Preventive care includes getting regular testing and making lifestyle changes as recommended by your health care provider. Talk with your health care provider about: Which screenings and tests you should have. A screening is a test that checks for a disease when you have no symptoms. A diet and exercise plan that is right for you. What should I know about screenings and tests to prevent falls? Screening and testing are the best ways to find a health problem early. Early diagnosis and treatment give you the best chance of managing medical conditions that are common after age 51. Certain conditions and lifestyle choices may make you more likely to have a fall. Your health care provider may recommend: Regular vision checks. Poor vision and conditions such as cataracts can make you more likely to have a fall. If you wear glasses, make sure to get your prescription updated if your vision changes. Medicine review. Work with your health care provider to regularly review all of the medicines you are taking, including over-the-counter medicines. Ask your health care provider about any side effects that may make you more likely to have a fall. Tell your health care provider if any medicines that you take make you feel dizzy or sleepy. Strength and balance checks. Your health care provider may recommend certain tests to check your strength and balance while standing, walking, or changing positions. Foot health exam. Foot pain and numbness, as well as not wearing proper footwear, can make you more likely to have a fall. Screenings, including: Osteoporosis screening. Osteoporosis is a condition that causes the bones to get weaker  and break more easily. Blood pressure screening.  Blood pressure changes and medicines to control blood pressure can make you feel dizzy. Depression screening. You may be more likely to have a fall if you have a fear of falling, feel depressed, or feel unable to do activities that you used to do. Alcohol use screening. Using too much alcohol can affect your balance and may make you more likely to have a fall. Follow these instructions at home: Lifestyle Do not drink alcohol if: Your health care provider tells you not to drink. If you drink alcohol: Limit how much you have to: 0-1 drink a day for women. 0-2 drinks a day for men. Know how much alcohol is in your drink. In the [place] one drink equals one 12 oz bottle of beer (355 mL), one 5 oz glass of wine (148 mL), or one 1 oz glass of hard liquor (44 mL). Do not use any products that contain nicotine or tobacco. These products include cigarettes, chewing tobacco, and vaping devices, such as e-cigarettes. If you need help quitting, ask your health care provider. Activity  Follow a regular exercise program to stay fit. This will help you maintain your balance. Ask your health care provider what types of exercise are appropriate for you. If you need a cane or walker, use it as recommended by your health care provider. Wear supportive shoes that have nonskid soles. Safety  Remove any tripping hazards, such as rugs, cords, and clutter. Install safety equipment such as grab bars in bathrooms and safety rails on stairs. Keep rooms and walkways well-lit. General instructions Talk with your health care provider about your risks for falling. Tell your health care provider if: You fall. Be sure to tell your health care provider about all falls, even ones that seem minor. You feel dizzy, tiredness (fatigue), or off-balance. Take over-the-counter and prescription medicines only as told by your health care provider. These include supplements. Eat a healthy diet and maintain a healthy weight. A healthy  diet includes low-fat dairy products, low-fat (lean) meats, and fiber from whole grains, beans, and lots of fruits and vegetables. Stay current with your vaccines. Schedule regular health, dental, and eye exams. Summary Having a healthy lifestyle and getting preventive care can help to protect your health and wellness after age 42. Screening and testing are the best way to find a health problem early and help you avoid having a fall. Early diagnosis and treatment give you the best chance for managing medical conditions that are more common for people who are older than age 58. Falls are a major cause of broken bones and head injuries in people who are older than age 57. Take precautions to prevent a fall at home. Work with your health care provider to learn what changes you can make to improve your health and wellness and to prevent falls. This information is not intended to replace advice given to you by your health care provider. Make sure you discuss any questions you have with your health care provider. Document Revised: 01/29/2021 Document Reviewed: 01/29/2021 Elsevier Patient Education  [place], MD [facility]

## 2022-09-09 NOTE — Assessment & Plan Note (Signed)
Stable.  Diet and nutrition discussed. Intolerant to statins.

## 2022-09-09 NOTE — Assessment & Plan Note (Signed)
Well-controlled hypertension. Continue losartan 50 mg daily and metoprolol succinate 25 mg daily. Well-controlled diabetes with hemoglobin A1c of 6.9. Continue glipizide 5 mg twice a day Cardiovascular risks associated with hypertension and diabetes discussed. Diet and nutrition discussed. Follow-up in 6 months.

## 2022-09-09 NOTE — Patient Instructions (Signed)
Health Maintenance After Age 74 After age 74, you are at a higher risk for certain long-term diseases and infections as well as injuries from falls. Falls are a major cause of broken bones and head injuries in people who are older than age 74. Getting regular preventive care can help to keep you healthy and well. Preventive care includes getting regular testing and making lifestyle changes as recommended by your health care provider. Talk with your health care provider about: Which screenings and tests you should have. A screening is a test that checks for a disease when you have no symptoms. A diet and exercise plan that is right for you. What should I know about screenings and tests to prevent falls? Screening and testing are the best ways to find a health problem early. Early diagnosis and treatment give you the best chance of managing medical conditions that are common after age 74. Certain conditions and lifestyle choices may make you more likely to have a fall. Your health care provider may recommend: Regular vision checks. Poor vision and conditions such as cataracts can make you more likely to have a fall. If you wear glasses, make sure to get your prescription updated if your vision changes. Medicine review. Work with your health care provider to regularly review all of the medicines you are taking, including over-the-counter medicines. Ask your health care provider about any side effects that may make you more likely to have a fall. Tell your health care provider if any medicines that you take make you feel dizzy or sleepy. Strength and balance checks. Your health care provider may recommend certain tests to check your strength and balance while standing, walking, or changing positions. Foot health exam. Foot pain and numbness, as well as not wearing proper footwear, can make you more likely to have a fall. Screenings, including: Osteoporosis screening. Osteoporosis is a condition that causes  the bones to get weaker and break more easily. Blood pressure screening. Blood pressure changes and medicines to control blood pressure can make you feel dizzy. Depression screening. You may be more likely to have a fall if you have a fear of falling, feel depressed, or feel unable to do activities that you used to do. Alcohol use screening. Using too much alcohol can affect your balance and may make you more likely to have a fall. Follow these instructions at home: Lifestyle Do not drink alcohol if: Your health care provider tells you not to drink. If you drink alcohol: Limit how much you have to: 0-1 drink a day for women. 0-2 drinks a day for men. Know how much alcohol is in your drink. In the [place] one drink equals one 12 oz bottle of beer (355 mL), one 5 oz glass of wine (148 mL), or one 1 oz glass of hard liquor (44 mL). Do not use any products that contain nicotine or tobacco. These products include cigarettes, chewing tobacco, and vaping devices, such as e-cigarettes. If you need help quitting, ask your health care provider. Activity  Follow a regular exercise program to stay fit. This will help you maintain your balance. Ask your health care provider what types of exercise are appropriate for you. If you need a cane or walker, use it as recommended by your health care provider. Wear supportive shoes that have nonskid soles. Safety  Remove any tripping hazards, such as rugs, cords, and clutter. Install safety equipment such as grab bars in bathrooms and safety rails on stairs. Keep rooms and walkways   well-lit. General instructions Talk with your health care provider about your risks for falling. Tell your health care provider if: You fall. Be sure to tell your health care provider about all falls, even ones that seem minor. You feel dizzy, tiredness (fatigue), or off-balance. Take over-the-counter and prescription medicines only as told by your health care provider. These include  supplements. Eat a healthy diet and maintain a healthy weight. A healthy diet includes low-fat dairy products, low-fat (lean) meats, and fiber from whole grains, beans, and lots of fruits and vegetables. Stay current with your vaccines. Schedule regular health, dental, and eye exams. Summary Having a healthy lifestyle and getting preventive care can help to protect your health and wellness after age 74. Screening and testing are the best way to find a health problem early and help you avoid having a fall. Early diagnosis and treatment give you the best chance for managing medical conditions that are more common for people who are older than age 74. Falls are a major cause of broken bones and head injuries in people who are older than age 74. Take precautions to prevent a fall at home. Work with your health care provider to learn what changes you can make to improve your health and wellness and to prevent falls. This information is not intended to replace advice given to you by your health care provider. Make sure you discuss any questions you have with your health care provider. Document Revised: 01/29/2021 Document Reviewed: 01/29/2021 Elsevier Patient Education  2023 Elsevier Inc.  

## 2022-09-09 NOTE — Assessment & Plan Note (Signed)
Stable.  No recent anginal episodes. Continue Plavix 75 mg daily.

## 2022-09-10 ENCOUNTER — Telehealth: Payer: Self-pay | Admitting: Gastroenterology

## 2022-09-10 NOTE — Telephone Encounter (Signed)
Based on 09/2021 path results patient will be due for recall colon in 09/2026. Pt will be 81 at that time. I sent patient a MyChart message with information regarding his recall date as requested.

## 2022-09-10 NOTE — Telephone Encounter (Signed)
Inbound call from patient stating that he was told he needed to have a repeat colonoscopy in a year. Patient had his last procedure with Dr. [name] on 10/08/2021 and has a recall in for 09/2026. Patient is seeking advice if he needs to wait until 2028 or if he needs to go ahead and schedule. Patient is requesting a Mychart message to be sent with answer.

## 2022-10-18 ENCOUNTER — Other Ambulatory Visit: Payer: Self-pay | Admitting: Emergency Medicine

## 2022-10-18 DIAGNOSIS — Z955 Presence of coronary angioplasty implant and graft: Secondary | ICD-10-CM

## 2022-10-21 ENCOUNTER — Ambulatory Visit: Payer: Medicare Other | Admitting: Nurse Practitioner

## 2022-10-21 NOTE — Progress Notes (Deleted)
Office Visit    Patient Name: Gregory Mathews. Date of Encounter: 10/21/2022  Primary Care Provider:  Horald Pollen, MD Primary Cardiologist:  Glenetta Hew, MD  Chief Complaint    77 year old male with a history of CAD s/p DES-LAD in 2019, mild aortic stenosis, mild carotid artery stenosis, hypertension, hyperlipidemia, and type 2 diabetes who presents for follow-up related to CAD.  Past Medical History    Past Medical History:  Diagnosis Date   Allergy    Basal cell carcinoma 01/2013   Nasal and R forearm.  [facility]   CAD S/P percutaneous coronary angioplasty    2/19 PCI/DESx1 to mLAD, FFR 0.7 -Sierra DES 2.75 mm x 18 mm   Cataract    Diabetes mellitus without complication (HCC)    on PO Meds   Essential hypertension    Family history of colon cancer    Family history of malignant neoplasm of breast    Heart murmur    Hyperlipidemia    statin intolerant -- myalgias, fatigue (tried at least 4)   Polyposis of colon    Rosacea    Past Surgical History:  Procedure Laterality Date   basal cell removal     nose and wrist    BELPHAROPTOSIS REPAIR     CORONARY CT ANGIOGRAM  09/2017   Coronary Calcium Score: 111. Coronary calcification noted in the LEFT MAIN (LM) and prox LAD.  LM < 50% calcified stenosis.  Mid LAD 50-75% plaque noted CT FFR --> positive at 0.80.  Recommend CARDIAC CATHETERIZATION   CORONARY STENT INTERVENTION N/A 11/12/2017   Procedure: CORONARY STENT INTERVENTION;  Surgeon: Leonie Man, MD;  Location: [place] [facility];  Service: Cardiovascular:  DES PCI:  STENT SIERRA 2.75 X 18 MM - Post intervention, there is a 0% residual stenosis.   ETT/GXT: Graded Exercise Tolerance Test  08/2015    Exercised for 9:01 min --> blunted blood pressure response no EKG changes.  No chest pain.  Low Risk   EYE SURGERY     Cataract surgery   eyelid surgery     INTRAVASCULAR PRESSURE WIRE/FFR STUDY N/A 11/12/2017   Procedure: INTRAVASCULAR  PRESSURE WIRE/FFR STUDY;  Surgeon: Leonie Man, MD;  Location: [place] [facility];  Service: Cardiovascular;  Laterality: LAD -FFR 0.76   LEFT HEART CATH AND CORONARY ANGIOGRAPHY N/A 11/12/2017   Procedure: LEFT HEART CATH AND CORONARY ANGIOGRAPHY;  Surgeon: Leonie Man, MD;  Location: [facility]: 70% P-M LAD (FFR 0.76).  40% mid LAD, 50% ostial ramus.  EF 55-60%.  Mildly elevated LVEDP.   SHOULDER OPEN ROTATOR CUFF REPAIR  1983   TRANSTHORACIC ECHOCARDIOGRAM  08/2017   EF 55-60%.  Mild aortic stenosis (mean gradient 11 mmH.)  GR 1 DD.  Mild LA dilation.    Allergies  Allergies  Allergen Reactions   Januvia [Sitagliptin] Other (See Comments)    Gi upset and kidney problems   Lisinopril Cough   Repatha [Evolocumab] Other (See Comments)    Myalgias    Statins Other (See Comments)    Severe leg pain & fatigue -- tried at least 4 (even low dose)     Labs/Other Studies Reviewed    The following studies were reviewed today: ETT 2021/12/03:   Findings are consistent with no prior ischemia. The study is low risk.   No ST deviation was noted.   LV perfusion is equivocal. There is no evidence of ischemia. There is no evidence of infarction.  Left ventricular function is normal. Nuclear stress EF: 55 %. The left ventricular ejection fraction is normal (55-65%). End diastolic cavity size is normal. End systolic cavity size is normal.   Prior study not available for comparison.   PVCs increased from stage 3 until recovery, with frequent PVCs and intermittent PVC couplets. There is baseline mild decrease in perfusion of the inferior wall and basal-mid inferoseptal and inferolatera. This improves with stress. Wall motion is normal. No clear attentuation artifact in the area. Not consistent with ischemia.  Carotid Duplex 10/19/2021: Summary:  Right Carotid: Velocities in the right ICA are consistent with a 1-39%  stenosis.   Left Carotid: Velocities in the left ICA are consistent  with a 1-39%  stenosis.   Vertebrals: Bilateral vertebral arteries demonstrate antegrade flow.  Subclavians: Normal flow hemodynamics were seen in bilateral subclavian arteries.   Echo 10/19/2019: IMPRESSIONS    1. Left ventricular ejection fraction, by visual estimation, is 60 to  65%. The left ventricle has normal function. There is no left ventricular  hypertrophy.   2. The left ventricle has no regional wall motion abnormalities.   3. Normal GLS -18.3.   4. Global right ventricle has normal systolic function.The right  ventricular size is normal. No increase in right ventricular wall  thickness.   5. Left atrial size was mildly dilated.   6. Right atrial size was normal.   7. The mitral valve is normal in structure. Trivial mitral valve  regurgitation. No evidence of mitral stenosis.   8. The tricuspid valve is normal in structure.   9. The tricuspid valve is normal in structure. Tricuspid valve  regurgitation is not demonstrated.  10. The aortic valve is tricuspid. Aortic valve regurgitation is not  visualized. Mild aortic valve stenosis.  11. Pulmonic regurgitation is mild.  12. The pulmonic valve was grossly normal. Pulmonic valve regurgitation is  mild.  13. Normal pulmonary artery systolic pressure.  14. The inferior vena cava is normal in size with greater than 50%  respiratory variability, suggesting right atrial pressure of 3 mmHg.   LHC 11/12/2017: Prox LAD lesion is 70% stenosed. FFR 0.76: CULPRIT LESION A drug-eluting stent was successfully placed using a STENT SIERRA 2.75 X 18 MM. Post intervention, there is a 0% residual stenosis. Mid LAD lesion is 40% stenosed well beyond the stent. Ost Ramus lesion is 50% stenosed. The left ventricular systolic function is normal. The left ventricular ejection fraction is 55-65% by visual estimate. LV end diastolic pressure is mildly elevated.   As an expected from coronary CTA, FFR positive proximal LAD lesion (although it  did not appear angiographically significant, was FFR positive) treated with a DES stent Normal LVEF with mildly elevated EDP.   Plan: Same day discharge after bed rest. DAPT for minimum 6 months, can stop aspirin at 6 months. We will try to start rosuvastatin at 1 tab every other day.  We will need to reassess in follow-up. Discharge on low-dose beta-blocker  Recent Labs: 09/09/2022: ALT 21; BUN 18; Creatinine, Ser 0.90; Potassium 4.0; Sodium 141  Recent Lipid Panel    Component Value Date/Time   CHOL 160 09/09/2022 0935   CHOL 86 (L) 11/16/2020 0857   TRIG 130.0 09/09/2022 0935   HDL 40.90 09/09/2022 0935   HDL 41 11/16/2020 0857   CHOLHDL 4 09/09/2022 0935   VLDL 26.0 09/09/2022 0935   LDLCALC 93 09/09/2022 0935   LDLCALC 24 11/16/2020 0857    History of Present Illness  77 year old male with the above past medical history including CAD s/p DES-LAD in 2019, mild aortic stenosis, mild carotid artery stenosis, hypertension, hyperlipidemia, and type 2 diabetes.   He has a strong family history of CAD with abnormal coronary CTA in 2019.  He underwent cardiac catheterization and PCI-LAD in 2019.  Echocardiogram in 09/2019 showed EF 60 to 65%, normal LV function, normal RV systolic function, mild aortic stenosis.  He is intolerant to statins.  He did not tolerate Praluent.  He was seen in the office on 10/19/2021 and was stable from a cardiac standpoint.  He denies symptoms concerning for angina.  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  He was referred to Dr. Debara Pickett for management of hyperlipidemia.  He was seen in the lipid clinic on 06/04/2022.  He was started on Leqvio.  He presents today for follow-up.  Since his last visit  CAD: Mild aortic stenosis: Mild carotid artery stenosis: Hypertension: Hyperlipidemia: Type 2 diabetes: Disposition:  Home Medications    Current Outpatient Medications  Medication Sig Dispense Refill   blood glucose meter kit and supplies Use to test blood  sugar daily. Dx: E11.9 1 each 0   clopidogrel (PLAVIX) 75 MG tablet Take 1 tablet (75 mg total) by mouth daily. 90 tablet 1   clotrimazole-betamethasone (LOTRISONE) cream Apply 1 Application topically daily. 30 g 0   glipiZIDE (GLUCOTROL) 5 MG tablet Take 1 tablet (5 mg total) by mouth 2 (two) times daily before a meal. 180 tablet 3   Lancets MISC Use to test blood sugar daily. Dx code :E11.9 100 each 3   losartan (COZAAR) 50 MG tablet Take 1 tablet (50 mg total) by mouth daily. 90 tablet 3   metoprolol succinate (TOPROL-XL) 25 MG 24 hr tablet TAKE 1 TABLET (25 MG TOTAL) BY MOUTH DAILY. 90 tablet 3   Multiple Vitamin (MULTIVITAMIN) tablet Take 1 tablet by mouth daily.     ONETOUCH ULTRA test strip USE TO TEST BLOOD SUGAR ONCE DAILY 100 strip 11   No current facility-administered medications for this visit.     Review of Systems    ***.  All other systems reviewed and are otherwise negative except as noted above.    Physical Exam    VS:  There were no vitals taken for this visit. , BMI There is no height or weight on file to calculate BMI.     GEN: Well nourished, well developed, in no acute distress. HEENT: normal. Neck: Supple, no JVD, carotid bruits, or masses. Cardiac: RRR, no murmurs, rubs, or gallops. No clubbing, cyanosis, edema.  Radials/DP/PT 2+ and equal bilaterally.  Respiratory:  Respirations regular and unlabored, clear to auscultation bilaterally. GI: Soft, nontender, nondistended, BS + x 4. MS: no deformity or atrophy. Skin: warm and dry, no rash. Neuro:  Strength and sensation are intact. Psych: Normal affect.  Accessory Clinical Findings    ECG personally reviewed by me today - *** - no acute changes.   Lab Results  Component Value Date   WBC 7.7 08/13/2021   HGB 13.3 08/13/2021   HCT 39.0 08/13/2021   MCV 91.2 08/13/2021   PLT 205.0 08/13/2021   Lab Results  Component Value Date   CREATININE 0.90 09/09/2022   BUN 18 09/09/2022   NA 141 09/09/2022   K  4.0 09/09/2022   CL 104 09/09/2022   CO2 26 09/09/2022   Lab Results  Component Value Date   ALT 21 09/09/2022   AST 20 09/09/2022  ALKPHOS 57 09/09/2022   BILITOT 0.6 09/09/2022   Lab Results  Component Value Date   CHOL 160 09/09/2022   HDL 40.90 09/09/2022   LDLCALC 93 09/09/2022   TRIG 130.0 09/09/2022   CHOLHDL 4 09/09/2022    Lab Results  Component Value Date   HGBA1C 6.9 (A) 09/09/2022    Assessment & Plan    1.  ***  No BP recorded.  {Refresh Note OR Click here to enter BP  :1}***   Lenna Sciara, NP 10/21/2022, 8:50 AM

## 2022-10-30 DIAGNOSIS — D485 Neoplasm of uncertain behavior of skin: Secondary | ICD-10-CM | POA: Diagnosis not present

## 2022-10-30 DIAGNOSIS — C44612 Basal cell carcinoma of skin of right upper limb, including shoulder: Secondary | ICD-10-CM | POA: Diagnosis not present

## 2022-11-04 DIAGNOSIS — N481 Balanitis: Secondary | ICD-10-CM | POA: Diagnosis not present

## 2022-11-04 DIAGNOSIS — R3914 Feeling of incomplete bladder emptying: Secondary | ICD-10-CM | POA: Diagnosis not present

## 2022-11-05 DIAGNOSIS — D485 Neoplasm of uncertain behavior of skin: Secondary | ICD-10-CM | POA: Diagnosis not present

## 2022-11-05 DIAGNOSIS — C44612 Basal cell carcinoma of skin of right upper limb, including shoulder: Secondary | ICD-10-CM | POA: Diagnosis not present

## 2022-11-06 ENCOUNTER — Telehealth: Payer: Self-pay

## 2022-11-06 ENCOUNTER — Telehealth: Payer: Self-pay | Admitting: Cardiology

## 2022-11-06 NOTE — Telephone Encounter (Signed)
  Patient Consent for Virtual Visit         Gregory Mathews. has provided verbal consent on 11/06/2022 for a virtual visit (video or telephone).   CONSENT FOR VIRTUAL VISIT FOR:  Gregory Mathews.  By participating in this virtual visit I agree to the following:  I hereby voluntarily request, consent and authorize [facility] and its employed or contracted physicians, physician assistants, nurse practitioners or other licensed health care professionals (the Practitioner), to provide me with telemedicine health care services (the "Services") as deemed necessary by the treating Practitioner. I acknowledge and consent to receive the Services by the Practitioner via telemedicine. I understand that the telemedicine visit will involve communicating with the Practitioner through live audiovisual communication technology and the disclosure of certain medical information by electronic transmission. I acknowledge that I have been given the opportunity to request an in-person assessment or other available alternative prior to the telemedicine visit and am voluntarily participating in the telemedicine visit.  I understand that I have the right to withhold or withdraw my consent to the use of telemedicine in the course of my care at any time, without affecting my right to future care or treatment, and that the Practitioner or I may terminate the telemedicine visit at any time. I understand that I have the right to inspect all information obtained and/or recorded in the course of the telemedicine visit and may receive copies of available information for a reasonable fee.  I understand that some of the potential risks of receiving the Services via telemedicine include:  Delay or interruption in medical evaluation due to technological equipment failure or disruption; Information transmitted may not be sufficient (e.g. poor resolution of images) to allow for appropriate medical decision making by the  Practitioner; and/or  In rare instances, security protocols could fail, causing a breach of personal health information.  Furthermore, I acknowledge that it is my responsibility to provide information about my medical history, conditions and care that is complete and accurate to the best of my ability. I acknowledge that Practitioner's advice, recommendations, and/or decision may be based on factors not within their control, such as incomplete or inaccurate data provided by me or distortions of diagnostic images or specimens that may result from electronic transmissions. I understand that the practice of medicine is not an exact science and that Practitioner makes no warranties or guarantees regarding treatment outcomes. I acknowledge that a copy of this consent can be made available to me via my patient portal ([facility]), or I can request a printed copy by calling the office of [facility].    I understand that my insurance will be billed for this visit.   I have read or had this consent read to me. I understand the contents of this consent, which adequately explains the benefits and risks of the Services being provided via telemedicine.  I have been provided ample opportunity to ask questions regarding this consent and the Services and have had my questions answered to my satisfaction. I give my informed consent for the services to be provided through the use of telemedicine in my medical care

## 2022-11-06 NOTE — Telephone Encounter (Signed)
Spoke with the patient who is agreeable with telehealth appointment.   Med list reviewed and consent given. Patient voiced understanding of process and what to expect for telehealth appt.

## 2022-11-06 NOTE — Telephone Encounter (Signed)
     Pre-operative Risk Assessment    Patient Name: Gregory Mathews.  DOB: Dec 12, 1945 MRN: [id number]      Request for Surgical Clearance    Procedure:   circumcision   Date of Surgery:  Clearance TBD                                 Surgeon:  Dr. Linna Hoff  Surgeon's Group or Practice Name:  [facility]  Phone number:  [phone] Fax number:  [phone]   Type of Clearance Requested:   - Medical  - Pharmacy:  Hold Clopidogrel (Plavix) 1 week prior    Type of Anesthesia:  General    Additional requests/questions:    Signed, Selinda Orion   11/06/2022, 3:45 PM

## 2022-11-06 NOTE — Telephone Encounter (Signed)
   Name: Gregory Mathews.  DOB: Jul 17, 1946  MRN: [id number]  Primary Cardiologist: Glenetta Hew, MD   Preoperative team, please contact this patient and set up a phone call appointment for further preoperative risk assessment. Please obtain consent and complete medication review. Thank you for your help.  I confirm that guidance regarding antiplatelet and oral anticoagulation therapy has been completed and, if necessary, noted below.  Per Dr. Ellyn Hack patient is okay to hold Plavix 5 to 7 days and restart when hemostasis is achieved.   [name], [name], NP 11/06/2022, 4:01 PM [place]

## 2022-11-18 ENCOUNTER — Ambulatory Visit: Payer: Medicare Other

## 2022-11-18 ENCOUNTER — Encounter: Payer: Self-pay | Admitting: Physician Assistant

## 2022-11-18 NOTE — Progress Notes (Signed)
Patient on for clearance phone visit today. However, last cardiology OV was 09/2021 so > 1 year ago. The 05/2022 visit was lipid management only, not CAD or assessment of general cardiac status. Have requested callback team in original phone note to arrange in person OV for clearance -has appt 3/6 in the office, may suffice if procedure date scheduled after.

## 2022-11-18 NOTE — Telephone Encounter (Signed)
Pt has been made aware that the tele appt for today has been cancelled due to he is needing an in office appt as he is due to see his cardiologist. I d/w the pt that I did s/w Selita at Dr. Linna Hoff office to be sure the surgery was not urgent matter. Per Selita, surgery is not urgent. Pt is fine to cancel tele appt today and keep his in office appt 11/27/22.   I will cancel the tele appt.

## 2022-11-18 NOTE — Telephone Encounter (Signed)
   Name: Gloria Delamater.  DOB: 21-Nov-1945  MRN: [id number]  Primary Cardiologist: Glenetta Hew, MD  Chart reviewed as part of pre-operative protocol coverage, was on virtual schedule for today. Patient was last seen by general cardiology team 10/19/21. The 05/2022 visit was for lipid management only, no assessment of CAD done at that time. Given >1 year since last comprehensive cardiology visit, he will require a follow-up in-office visit in order to better assess preoperative cardiovascular risk. He has an appointment scheduled 3/6 with Diona Browner NP. If procedure date is after this, would be OK to keep appt. Thank you!  Pre-op covering staff: - Please schedule appointment and call patient to inform them. If patient already had an upcoming appointment within acceptable timeframe, please add "pre-op clearance" to the appointment notes so provider is aware. - Please contact requesting surgeon's office via preferred method (i.e, phone, fax) to inform them of need for appointment prior to surgery.  Jaquelyn Bitter already reviewed Plavix as below.   Charlie Pitter, PA-C  11/18/2022, 7:09 AM

## 2022-11-19 ENCOUNTER — Ambulatory Visit (HOSPITAL_BASED_OUTPATIENT_CLINIC_OR_DEPARTMENT_OTHER): Payer: Medicare Other | Admitting: Internal Medicine

## 2022-11-26 ENCOUNTER — Encounter: Payer: Self-pay | Admitting: Pharmacist

## 2022-11-26 NOTE — Progress Notes (Signed)
[place] [facility]) [place] Team Statin Quality Measure Assessment  11/26/2022  [name] Apr 17, 1946 [id number]  Per review of chart and payor information, [name] has a diagnosis of diabetes and cardiovascular disease but is not currently filling a statin prescription.  This places patient into the Statin Use In Patients with Diabetes (SUPD) and Statin Use in Patients with Cardiovascular Disease (SPC) measures for CMS.    Patient has documented trials of statins with reported sever leg pain, but no corresponding CPT codes that would exclude patient from SUPD and SPC measures. 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  This would remove the patient from the measure for 2024.   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  Curlene Labrum, PharmD [facility] Pharmacist Office: [phone]

## 2022-11-27 ENCOUNTER — Encounter: Payer: Self-pay | Admitting: Nurse Practitioner

## 2022-11-27 ENCOUNTER — Ambulatory Visit: Payer: Medicare Other | Attending: Nurse Practitioner | Admitting: Nurse Practitioner

## 2022-11-27 VITALS — BP 128/72 | HR 65 | Ht 69.0 in | Wt 211.0 lb

## 2022-11-27 DIAGNOSIS — E119 Type 2 diabetes mellitus without complications: Secondary | ICD-10-CM | POA: Diagnosis not present

## 2022-11-27 DIAGNOSIS — I350 Nonrheumatic aortic (valve) stenosis: Secondary | ICD-10-CM

## 2022-11-27 DIAGNOSIS — I10 Essential (primary) hypertension: Secondary | ICD-10-CM | POA: Diagnosis not present

## 2022-11-27 DIAGNOSIS — G720 Drug-induced myopathy: Secondary | ICD-10-CM | POA: Diagnosis not present

## 2022-11-27 DIAGNOSIS — E7800 Pure hypercholesterolemia, unspecified: Secondary | ICD-10-CM | POA: Diagnosis not present

## 2022-11-27 DIAGNOSIS — I2510 Atherosclerotic heart disease of native coronary artery without angina pectoris: Secondary | ICD-10-CM | POA: Diagnosis not present

## 2022-11-27 DIAGNOSIS — T466X5A Adverse effect of antihyperlipidemic and antiarteriosclerotic drugs, initial encounter: Secondary | ICD-10-CM | POA: Diagnosis not present

## 2022-11-27 DIAGNOSIS — Z955 Presence of coronary angioplasty implant and graft: Secondary | ICD-10-CM | POA: Diagnosis not present

## 2022-11-27 DIAGNOSIS — E1165 Type 2 diabetes mellitus with hyperglycemia: Secondary | ICD-10-CM

## 2022-11-27 DIAGNOSIS — Z01810 Encounter for preprocedural cardiovascular examination: Secondary | ICD-10-CM

## 2022-11-27 DIAGNOSIS — H43812 Vitreous degeneration, left eye: Secondary | ICD-10-CM | POA: Diagnosis not present

## 2022-11-27 DIAGNOSIS — H04123 Dry eye syndrome of bilateral lacrimal glands: Secondary | ICD-10-CM | POA: Diagnosis not present

## 2022-11-27 DIAGNOSIS — R0989 Other specified symptoms and signs involving the circulatory and respiratory systems: Secondary | ICD-10-CM | POA: Diagnosis not present

## 2022-11-27 DIAGNOSIS — E785 Hyperlipidemia, unspecified: Secondary | ICD-10-CM | POA: Diagnosis not present

## 2022-11-27 DIAGNOSIS — H26491 Other secondary cataract, right eye: Secondary | ICD-10-CM | POA: Diagnosis not present

## 2022-11-27 LAB — HM DIABETES EYE EXAM

## 2022-11-27 MED ORDER — METOPROLOL SUCCINATE ER 25 MG PO TB24: 25.0000 mg | ORAL_TABLET | Freq: Every day | ORAL | 3 refills | Status: DC

## 2022-11-27 MED ORDER — CLOPIDOGREL BISULFATE 75 MG PO TABS: 75.0000 mg | ORAL_TABLET | Freq: Every day | ORAL | 3 refills | Status: DC

## 2022-11-27 NOTE — Patient Instructions (Signed)
Medication Instructions:  Your physician recommends that you continue on your current medications as directed. Please refer to the Current Medication list given to you today.  *If you need a refill on your cardiac medications before your next appointment, please call your pharmacy*   Lab Work: Complete lab work previously ordered. LPA, NMR If you have labs (blood work) drawn today and your tests are completely normal, you will receive your results only by: MyChart Message (if you have MyChart) OR A paper copy in the mail If you have any lab test that is abnormal or we need to change your treatment, we will call you to review the results.   Testing/Procedures: Your physician has requested that you have an echocardiogram. Echocardiography is a painless test that uses sound waves to create images of your heart. It provides your doctor with information about the size and shape of your heart and how well your heart's chambers and valves are working. This procedure takes approximately one hour. There are no restrictions for this procedure. Please do NOT wear cologne, perfume, aftershave, or lotions (deodorant is allowed). Please arrive 15 minutes prior to your appointment time.  Your physician has requested that you have en exercise stress myoview. For further information please visit [email or web address]. Please follow instruction sheet, as given.        Follow-Up: At [facility], you and your health needs are our priority.  As part of our continuing mission to provide you with exceptional heart care, we have created designated Provider Care Teams.  These Care Teams include your primary Cardiologist (physician) and Advanced Practice Providers (APPs -  Physician Assistants and Nurse Practitioners) who all work together to provide you with the care you need, when you need it.  We recommend signing up for the patient portal called "MyChart".  Sign up information is provided on this  After Visit Summary.  MyChart is used to connect with patients for Virtual Visits (Telemedicine).  Patients are able to view lab/test results, encounter notes, upcoming appointments, etc.  Non-urgent messages can be sent to your provider as well.   To learn more about what you can do with MyChart, go to [email or web address].    Your next appointment:   1-3 month(s) (Dr. Geralyn Corwin Lipid apt) November 2024 (Dr.Harding or Raquel Sarna) Provider:   Glenetta Hew, MD     Other Instructions How to Prepare for Your Myoview Test (stress test):  1.Please do not take these medications before your test:  (please note if this is an exercise test pt should hold beta blocker prior) 2.Your remaining medications may be taken with water. 3.Nothing to eat or drink, except water, 4 hours prior to arrival time.  NO caffeine/decaffeinated products, or chocolate 12 hours prior to arrival. 4.Ladies, please do not wear dresses.  Skirts or pants are approprate, please wear a short sleeve shirt. 5.NO perfume, cologne or lotion 6.Wear comfortable walking shoes.  NO HEELS! 7.Total time is 3 to 4 hours; you may want to bring reading material for the waiting time. 8.Please report to [facility] for your test  What to expect after you arrive:  Once you arrive and check in for your appointment an IV will be started in your arm.  Then the Technoligist will inject a small amount of radioactive tracer.  There will be a 1 hour waiting period after this injection.  A series of pictures will be taken of your heart following this waiting period.  You will be prepped for the stress portion  of the test.  During the stress portion of your test you will either walk on a treadmill or receive a small, safe amount of radioactive tracer injected in your IV.  After the stress portion, there is a short rest period during which time your heart and blood pressure will be monitored.  After the short rest period the Technologist will begin your  second set of pictures.  Your doctor will inform you of your test results within 7-10 business days.  In preparation for your appointment, medication and supplies will be purchased.  Appointment availability is limited, so if you need to cancel or reschedule please call the office at [phone] 24 hours in advance to avoid a cancellation fee of $100.00

## 2022-11-27 NOTE — Progress Notes (Signed)
Office Visit    Patient Name: Gregory Mathews. Date of Encounter: 11/27/2022  Primary Care Provider:  Horald Pollen, MD Primary Cardiologist:  Glenetta Hew, MD  Chief Complaint    77 year old male with a history of CAD s/p DES-LAD in 2019, mild aortic stenosis, hypertension, hyperlipidemia and type 2 diabetes who presents for follow-up related to CAD and for preoperative cardiac evaluation.  Past Medical History    Past Medical History:  Diagnosis Date   Allergy    Basal cell carcinoma 01/2013   Nasal and R forearm.  [facility]   CAD S/P percutaneous coronary angioplasty    2/19 PCI/DESx1 to mLAD, FFR 0.7 -Sierra DES 2.75 mm x 18 mm   Cataract    Diabetes mellitus without complication (HCC)    on PO Meds   Essential hypertension    Family history of colon cancer    Family history of malignant neoplasm of breast    Heart murmur    Hyperlipidemia    statin intolerant -- myalgias, fatigue (tried at least 4)   Polyposis of colon    Rosacea    Past Surgical History:  Procedure Laterality Date   basal cell removal     nose and wrist    BELPHAROPTOSIS REPAIR     CORONARY CT ANGIOGRAM  09/2017   Coronary Calcium Score: 111. Coronary calcification noted in the LEFT MAIN (LM) and prox LAD.  LM < 50% calcified stenosis.  Mid LAD 50-75% plaque noted CT FFR --> positive at 0.80.  Recommend CARDIAC CATHETERIZATION   CORONARY STENT INTERVENTION N/A 11/12/2017   Procedure: CORONARY STENT INTERVENTION;  Surgeon: Leonie Man, MD;  Location: [place] CV LAB;  Service: Cardiovascular:  DES PCI:  STENT SIERRA 2.75 X 18 MM - Post intervention, there is a 0% residual stenosis.   ETT/GXT: Graded Exercise Tolerance Test  08/2015    Exercised for 9:01 min --> blunted blood pressure response no EKG changes.  No chest pain.  Low Risk   EYE SURGERY     Cataract surgery   eyelid surgery     INTRAVASCULAR PRESSURE WIRE/FFR STUDY N/A 11/12/2017   Procedure: INTRAVASCULAR  PRESSURE WIRE/FFR STUDY;  Surgeon: Leonie Man, MD;  Location: [place] CV [facility];  Service: Cardiovascular;  Laterality: LAD -FFR 0.76   LEFT HEART CATH AND CORONARY ANGIOGRAPHY N/A 11/12/2017   Procedure: LEFT HEART CATH AND CORONARY ANGIOGRAPHY;  Surgeon: Leonie Man, MD;  Location: [facility] IN[facility]: 70% P-M LAD (FFR 0.76).  40% mid LAD, 50% ostial ramus.  EF 55-60%.  Mildly elevated LVEDP.   SHOULDER OPEN ROTATOR CUFF REPAIR  1983   TRANSTHORACIC ECHOCARDIOGRAM  08/2017   EF 55-60%.  Mild aortic stenosis (mean gradient 11 mmH.)  GR 1 DD.  Mild LA dilation.    Allergies  Allergies  Allergen Reactions   Januvia [Sitagliptin] Other (See Comments)    Gi upset and kidney problems   Lisinopril Cough   Repatha [Evolocumab] Other (See Comments)    Myalgias    Statins Other (See Comments)    Severe leg pain & fatigue -- tried at least 4 (even low dose)     Labs/Other Studies Reviewed    The following studies were reviewed today: Exercise myoview 10/2021:     Findings are consistent with no prior ischemia. The study is low risk.   No ST deviation was noted.   LV perfusion is equivocal. There is no evidence of ischemia. There is  no evidence of infarction.   Left ventricular function is normal. Nuclear stress EF: 55 %. The left ventricular ejection fraction is normal (55-65%). End diastolic cavity size is normal. End systolic cavity size is normal.   Prior study not available for comparison.   PVCs increased from stage 3 until recovery, with frequent PVCs and intermittent PVC couplets. There is baseline mild decrease in perfusion of the inferior wall and basal-mid inferoseptal and inferolatera. This improves with stress. Wall motion is normal. No clear attentuation artifact in the area. Not consistent with ischemia.   GXT stress test: 12/12/2020 Upsloping ST segment depression ST segment depression of 1 mm was noted during stress, and returning to baseline after 1-5 minutes of  recovery. Blood pressure demonstrated a normal response to exercise. Negative, adequate stress test.   Echocardiogram: 10/19/2019 1. Left ventricular ejection fraction, by visual estimation, is 60 to  65%. The left ventricle has normal function. There is no left ventricular  hypertrophy.   2. The left ventricle has no regional wall motion abnormalities.   3. Normal GLS -18.3.   4. Global right ventricle has normal systolic function.The right  ventricular size is normal. No increase in right ventricular wall  thickness.   5. Left atrial size was mildly dilated.   6. Right atrial size was normal.   7. The mitral valve is normal in structure. Trivial mitral valve  regurgitation. No evidence of mitral stenosis.   8. The tricuspid valve is normal in structure.   9. The tricuspid valve is normal in structure. Tricuspid valve  regurgitation is not demonstrated.  10. The aortic valve is tricuspid. Aortic valve regurgitation is not  visualized. Mild aortic valve stenosis.  11. Pulmonic regurgitation is mild.  12. The pulmonic valve was grossly normal. Pulmonic valve regurgitation is  mild.  13. Normal pulmonary artery systolic pressure.  14. The inferior vena cava is normal in size with greater than 50%  respiratory variability, suggesting right atrial pressure of 3 mmHg.    Left Heart Cath 11/12/2017 Prox LAD lesion is 70% stenosed. FFR 0.76: CULPRIT LESION A drug-eluting stent was successfully placed using a STENT SIERRA 2.75 X 18 MM. Post intervention, there is a 0% residual stenosis. Mid LAD lesion is 40% stenosed well beyond the stent. Ost Ramus lesion is 50% stenosed. The left ventricular systolic function is normal. The left ventricular ejection fraction is 55-65% by visual estimate. LV end diastolic pressure is mildly elevated.   As an expected from coronary CTA, FFR positive proximal LAD lesion (although it did not appear angiographically significant, was FFR positive) treated  with a DES stent Normal LVEF with mildly elevated EDP.     Plan: Same day discharge after bed rest. DAPT for minimum 6 months, can stop aspirin at 6 months. We will try to start rosuvastatin at 1 tab every other day.  We will need to reassess in follow-up. Discharge on low-dose beta-blocker    Recent Labs: 09/09/2022: ALT 21; BUN 18; Creatinine, Ser 0.90; Potassium 4.0; Sodium 141  Recent Lipid Panel    Component Value Date/Time   CHOL 160 09/09/2022 0935   CHOL 86 (L) 11/16/2020 0857   TRIG 130.0 09/09/2022 0935   HDL 40.90 09/09/2022 0935   HDL 41 11/16/2020 0857   CHOLHDL 4 09/09/2022 0935   VLDL 26.0 09/09/2022 0935   LDLCALC 93 09/09/2022 0935   LDLCALC 24 11/16/2020 0857    History of Present Illness    77 year old male with the above past  medical history including CAD s/p DES-LAD in 2019, mild aortic stenosis, hypertension, hyperlipidemia and type 2 diabetes.  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  He has a history of statin intolerance.  Additionally, he did not tolerate Praluent or Repatha. He was last seen in the office on 10/19/2021 and was stable from a cardiac standpoint.  He denied symptoms concerning for angina.  Repeat exercise Myoview in 10/2021 for DOT clearance was negative for ischemia.  He was noted to have bilateral carotid bruits.  Carotid ultrasound showed no evidence of carotid artery stenosis.  He saw Dr. Debara Pickett for lipid clinic on 06/04/2022.  He was prescribed Leqvio, but never started taking the medication.  He presents today for follow-up and for preoperative cardiac evaluation for circumcision with Dr. Linna Hoff of [facility].  Since last visit stable from a cardiac standpoint.  He denies symptoms concerning for angina.  He continues to exercise daily.  He notes that he will likely postpone his circumcision surgery until next fall/winter.  He also notes that he is due for repeat exercise Myoview for DOT clearance purposes.  Overall, he reports  feeling well.  Home Medications    Current Outpatient Medications  Medication Sig Dispense Refill   blood glucose meter kit and supplies Use to test blood sugar daily. Dx: E11.9 1 each 0   clotrimazole-betamethasone (LOTRISONE) cream Apply 1 Application topically daily. 30 g 0   ezetimibe (ZETIA) 10 MG tablet Take 10 mg by mouth daily.     glipiZIDE (GLUCOTROL) 5 MG tablet Take 1 tablet (5 mg total) by mouth 2 (two) times daily before a meal. 180 tablet 3   Lancets MISC Use to test blood sugar daily. Dx code :E11.9 100 each 3   losartan (COZAAR) 50 MG tablet Take 1 tablet (50 mg total) by mouth daily. 90 tablet 3   Multiple Vitamin (MULTIVITAMIN) tablet Take 1 tablet by mouth daily.     ONETOUCH ULTRA test strip USE TO TEST BLOOD SUGAR ONCE DAILY 100 strip 11   clopidogrel (PLAVIX) 75 MG tablet Take 1 tablet (75 mg total) by mouth daily. 90 tablet 3   metoprolol succinate (TOPROL-XL) 25 MG 24 hr tablet Take 1 tablet (25 mg total) by mouth daily. 90 tablet 3   No current facility-administered medications for this visit.     Review of Systems    He denies chest pain, palpitations, dyspnea, pnd, orthopnea, n, v, dizziness, syncope, edema, weight gain, or early satiety. All other systems reviewed and are otherwise negative except as noted above.  Physical Exam    VS:  BP 128/72   Pulse 65   Ht '5\' 9"'$  (1.753 m)   Wt 211 lb (95.7 kg)   BMI 31.16 kg/m  GEN: Well nourished, well developed, in no acute distress. HEENT: normal. Neck: Supple, no JVD, carotid bruits, or masses. Cardiac: RRR, no murmurs, rubs, or gallops. No clubbing, cyanosis, edema.  Radials/DP/PT 2+ and equal bilaterally.  Respiratory:  Respirations regular and unlabored, clear to auscultation bilaterally. GI: Soft, nontender, nondistended, BS + x 4. MS: no deformity or atrophy. Skin: warm and dry, no rash. Neuro:  Strength and sensation are intact. Psych: Normal affect.  Accessory Clinical Findings    ECG  personally reviewed by me today - NSR, 65 bpm - no acute changes.   Lab Results  Component Value Date   WBC 7.7 08/13/2021   HGB 13.3 08/13/2021   HCT 39.0 08/13/2021   MCV 91.2 08/13/2021  PLT 205.0 08/13/2021   Lab Results  Component Value Date   CREATININE 0.90 09/09/2022   BUN 18 09/09/2022   NA 141 09/09/2022   K 4.0 09/09/2022   CL 104 09/09/2022   CO2 26 09/09/2022   Lab Results  Component Value Date   ALT 21 09/09/2022   AST 20 09/09/2022   ALKPHOS 57 09/09/2022   BILITOT 0.6 09/09/2022   Lab Results  Component Value Date   CHOL 160 09/09/2022   HDL 40.90 09/09/2022   LDLCALC 93 09/09/2022   TRIG 130.0 09/09/2022   CHOLHDL 4 09/09/2022    Lab Results  Component Value Date   HGBA1C 6.9 (A) 09/09/2022    Assessment & Plan    1. CAD: S/p DES-LAD in 2019. Exercise Myoview in 10/2021 for DOT clearance was negative for ischemia. Stable with no anginal symptoms.  He is due for repeat exercise Myoview for DOT clearance purposes.  Will schedule.  Continue Plavix, losartan, metoprolol, Zetia.  Shared Decision Making/Informed Consent The risks [chest pain, shortness of breath, cardiac arrhythmias, dizziness, blood pressure fluctuations, myocardial infarction, stroke/transient ischemic attack, nausea, vomiting, allergic reaction, radiation exposure, metallic taste sensation and life-threatening complications (estimated to be 1 in 10,000)], benefits (risk stratification, diagnosing coronary artery disease, treatment guidance) and alternatives of a nuclear stress test were discussed in detail with Mr. Renter and he agrees to proceed.  2. Hypertension: BP well controlled. Continue current antihypertensive regimen.   3. Hyperlipidemia: LDL was 93 in 08/2022, above goal.  Has a history of statin intolerance with severe statin myopathy.  He did not tolerate Praluent or Repatha.  He was prescribed Leqvio but never started taking the medication.  Will have him follow-up with Dr.  Debara Pickett in the lipid clinic.  Will check LPA, NMR lipoprofile per Dr. Lysbeth Penner prior orders. Continue Zetia.   4. Carotid bruit: Noted on prior exam. Carotid Dopplers in 09/2020 showed no evidence of carotid artery stenosis.  5. Aortic stenosis: Mild on echo in 2021.  Asymptomatic.  Will repeat echocardiogram for routine monitoring.  6. Type 2 diabetes: A1C was 6.9 in 08/2022.  Monitored and managed per PCP.  7. Preoperative cardiac exam: According to the Revised Cardiac Risk Index (RCRI), his Perioperative Risk of Major Cardiac Event is (%): 0.9. His Functional Capacity in METs is: 8.97 according to the Duke Activity Status Index (DASI). Therefore, based on ACC/AHA guidelines, patient would be at acceptable risk for the planned procedure without further cardiovascular testing.  However, patient states he plans to delay surgery until this fall/winter.  He will likely require updated clearance at that time.  8. Disposition: Follow-up in 1 to 3 months with Dr. Debara Pickett in lipid clinic, follow-up with Dr. Ellyn Hack in 07/2023.     Lenna Sciara, NP 11/27/2022, 10:07 AM

## 2022-11-27 NOTE — Addendum Note (Signed)
Addended by: Diona Browner C on: 11/27/2022 10:09 AM   Modules accepted: Orders

## 2022-11-29 LAB — NMR, LIPOPROFILE
Cholesterol, Total: 166 mg/dL (ref 100–199)
HDL Particle Number: 30.6 umol/L (ref >=30.5)
HDL-C: 42 mg/dL (ref >39)
LDL Particle Number: 1526 nmol/L — ABNORMAL HIGH (ref <1000)
LDL Size: 20.3 nm — ABNORMAL LOW (ref >20.5)
LDL-C (NIH Calc): 102 mg/dL — ABNORMAL HIGH (ref 0–99)
LP-IR Score: 69 — ABNORMAL HIGH (ref <=45)
Small LDL Particle Number: 894 nmol/L — ABNORMAL HIGH (ref <=527)
Triglycerides: 121 mg/dL (ref 0–149)

## 2022-11-29 LAB — LIPOPROTEIN A (LPA): Lipoprotein (a): 16 nmol/L (ref <75.0)

## 2022-11-30 ENCOUNTER — Other Ambulatory Visit: Payer: Self-pay | Admitting: Emergency Medicine

## 2022-11-30 DIAGNOSIS — E1165 Type 2 diabetes mellitus with hyperglycemia: Secondary | ICD-10-CM

## 2022-12-03 ENCOUNTER — Other Ambulatory Visit: Payer: Self-pay | Admitting: Emergency Medicine

## 2022-12-03 DIAGNOSIS — R21 Rash and other nonspecific skin eruption: Secondary | ICD-10-CM

## 2022-12-03 DIAGNOSIS — E1165 Type 2 diabetes mellitus with hyperglycemia: Secondary | ICD-10-CM

## 2022-12-06 ENCOUNTER — Telehealth: Payer: Self-pay | Admitting: Cardiology

## 2022-12-06 NOTE — Telephone Encounter (Signed)
Follow Up:      Gregory Mathews from Dr Elmyra Ricks office called. She is checking on the status of patient's clearance. Please fax this to [phone].

## 2022-12-06 NOTE — Telephone Encounter (Signed)
   Patient Name: Nessiah Pha.  DOB: 1946/04/27 MRN: [id number]  Primary Cardiologist: Glenetta Hew, MD  Chart reviewed as part of pre-operative protocol coverage.  Patient was recently seen in clinic on 11/27/2022.  Patient stated at that time that he did not plan on proceeding with surgery until fall/winter 2024.  Preop Coverage team, please clarify with patient/surgeons office actual surgery date.  Thank you.  Lenna Sciara, NP 12/06/2022, 9:37 AM

## 2022-12-06 NOTE — Telephone Encounter (Signed)
I left a message surgery scheduler to call back and to let us know if they have a surgery date planned. Please see notes from Diona Browner, NP.

## 2022-12-06 NOTE — Telephone Encounter (Signed)
I will send message to pre op app to review if the pt has been cleared. Pt recently was seen by Diona Browner, NP, looks like some cardiac testing was ordered at the appt.

## 2022-12-06 NOTE — Telephone Encounter (Signed)
Selita from Dr. Elmyra Ricks office called back. I stated to her that the pt has stated to Diona Browner, NP when he saw her that he was not having is procedure until the fall/winter time and that it is just a routine follow. I assured Selita that I will be happy to send notes to her for their records as well.

## 2022-12-07 ENCOUNTER — Other Ambulatory Visit: Payer: Self-pay | Admitting: Emergency Medicine

## 2022-12-07 ENCOUNTER — Other Ambulatory Visit: Payer: Self-pay | Admitting: Cardiology

## 2022-12-07 DIAGNOSIS — E1169 Type 2 diabetes mellitus with other specified complication: Secondary | ICD-10-CM

## 2022-12-26 ENCOUNTER — Telehealth (HOSPITAL_COMMUNITY): Payer: Self-pay | Admitting: *Deleted

## 2022-12-26 NOTE — Telephone Encounter (Signed)
Per DPR left detailed instructions for MPI study scheduled on 12/31/22.

## 2022-12-30 ENCOUNTER — Ambulatory Visit (HOSPITAL_COMMUNITY): Payer: Medicare Other | Attending: Nurse Practitioner

## 2022-12-30 DIAGNOSIS — I2510 Atherosclerotic heart disease of native coronary artery without angina pectoris: Secondary | ICD-10-CM | POA: Diagnosis not present

## 2022-12-30 DIAGNOSIS — I350 Nonrheumatic aortic (valve) stenosis: Secondary | ICD-10-CM | POA: Insufficient documentation

## 2022-12-30 LAB — ECHOCARDIOGRAM COMPLETE
AR max vel: 1.15 cm2
AV Area VTI: 1.06 cm2
AV Area mean vel: 1.03 cm2
AV Mean grad: 22 mmHg
AV Peak grad: 35.3 mmHg
Ao pk vel: 2.97 m/s
Area-P 1/2: 2.33 cm2
S' Lateral: 2.8 cm

## 2022-12-31 ENCOUNTER — Ambulatory Visit (HOSPITAL_BASED_OUTPATIENT_CLINIC_OR_DEPARTMENT_OTHER): Payer: Medicare Other

## 2022-12-31 ENCOUNTER — Encounter: Payer: Self-pay | Admitting: Internal Medicine

## 2022-12-31 ENCOUNTER — Ambulatory Visit (INDEPENDENT_AMBULATORY_CARE_PROVIDER_SITE_OTHER): Payer: Medicare Other | Admitting: Internal Medicine

## 2022-12-31 VITALS — BP 128/76 | HR 75 | Temp 98.1°F | Ht 69.0 in | Wt 212.0 lb

## 2022-12-31 DIAGNOSIS — I350 Nonrheumatic aortic (valve) stenosis: Secondary | ICD-10-CM | POA: Diagnosis not present

## 2022-12-31 DIAGNOSIS — M5416 Radiculopathy, lumbar region: Secondary | ICD-10-CM | POA: Insufficient documentation

## 2022-12-31 DIAGNOSIS — E1165 Type 2 diabetes mellitus with hyperglycemia: Secondary | ICD-10-CM

## 2022-12-31 DIAGNOSIS — I2510 Atherosclerotic heart disease of native coronary artery without angina pectoris: Secondary | ICD-10-CM

## 2022-12-31 DIAGNOSIS — J0140 Acute pansinusitis, unspecified: Secondary | ICD-10-CM

## 2022-12-31 LAB — MYOCARDIAL PERFUSION IMAGING
Estimated workload: 10.1
Exercise duration (min): 9 min
Exercise duration (sec): 15 s
LV dias vol: 72 mL (ref 62–150)
LV sys vol: 30 mL
MPHR: 146 {beats}/min
Nuc Stress EF: 59 %
Peak HR: 133 {beats}/min
Percent HR: 91 %
Rest HR: 75 {beats}/min
Rest Nuclear Isotope Dose: 10.2 mCi
SDS: 0
SRS: 0
SSS: 0
ST Depression (mm): 0 mm
Stress Nuclear Isotope Dose: 31.3 mCi
TID: 0.93

## 2022-12-31 MED ORDER — AMOXICILLIN-POT CLAVULANATE 875-125 MG PO TABS: 1.0000 | ORAL_TABLET | Freq: Two times a day (BID) | ORAL | 0 refills | Status: DC

## 2022-12-31 MED ORDER — TECHNETIUM TC 99M TETROFOSMIN IV KIT
31.3000 | PACK | Freq: Once | INTRAVENOUS | Status: AC | PRN
  Administered 2022-12-31: 31.3 via INTRAVENOUS

## 2022-12-31 MED ORDER — TECHNETIUM TC 99M TETROFOSMIN IV KIT
10.2000 | PACK | Freq: Once | INTRAVENOUS | Status: AC | PRN
  Administered 2022-12-31: 10.2 via INTRAVENOUS

## 2022-12-31 MED ORDER — PREDNISONE 20 MG PO TABS: 40.0000 mg | ORAL_TABLET | Freq: Every day | ORAL | 0 refills | Status: AC

## 2022-12-31 NOTE — Patient Instructions (Addendum)
       Medications changes include :   Augmentin for your sinus infection.  Prednisone 40 mg daily with breakfast x 5 days.  Increase the glipizide to 10 mg twice a day if needed if sugars are high.        Return if symptoms worsen or fail to improve.

## 2022-12-31 NOTE — Assessment & Plan Note (Signed)
Acute Concern for bacterial cause Start Augmentin 875-125 mg BID x 10 day otc cold medications Rest, fluid Call if no improvement

## 2022-12-31 NOTE — Assessment & Plan Note (Signed)
Chronic Sugars have been controlled Starting prednisone for back pain - dicussed sugars will go up -- can increase glipizide to 10 mg bid if needed  Monitor sugars at home

## 2022-12-31 NOTE — Progress Notes (Signed)
Subjective:    Patient ID: Gregory Mathews., male    DOB: 1945-10-31, 77 y.o.   MRN: [id number]      HPI [place] is here for  Chief Complaint  Patient presents with   Leg Pain    Sciatica nerve pain in left leg started two weeks ago    Left leg pain - it started one month ago - worse in last 2 weeks.  It hurts when he lifts his leg up.  It starts in his left buttock and goes all the way down the leg.  No N/T.  No weakness in the leg.  No lower back pain.  He has been lifting a lot and carrying things up stairs.     Having sinus headaches.  Has nasal congestion, sinus pressure/pain.   No fever.   Stared 3 weeks ago.  Has h/o sinus infections.  Uses saline solution.  Uses nasocort.      Medications and allergies reviewed with patient and updated if appropriate.  Current Outpatient Medications on File Prior to Visit  Medication Sig Dispense Refill   blood glucose meter kit and supplies Use to test blood sugar daily. Dx: E11.9 1 each 0   clopidogrel (PLAVIX) 75 MG tablet Take 1 tablet (75 mg total) by mouth daily. 90 tablet 3   clotrimazole-betamethasone (LOTRISONE) cream APPLY 1 APPLICATION TOPICALLY DAILY 30 g 0   ezetimibe (ZETIA) 10 MG tablet TAKE 1 TABLET BY MOUTH EVERY DAY 90 tablet 1   glipiZIDE (GLUCOTROL) 5 MG tablet Take 1 tablet (5 mg total) by mouth 2 (two) times daily before a meal. 180 tablet 3   Lancets MISC Use to test blood sugar daily. Dx code :E11.9 100 each 3   losartan (COZAAR) 50 MG tablet Take 1 tablet (50 mg total) by mouth daily. 90 tablet 3   metoprolol succinate (TOPROL-XL) 25 MG 24 hr tablet Take 1 tablet (25 mg total) by mouth daily. 90 tablet 3   Multiple Vitamin (MULTIVITAMIN) tablet Take 1 tablet by mouth daily.     ONETOUCH ULTRA test strip USE TO TEST BLOOD SUGAR ONCE DAILY 100 strip 11   No current facility-administered medications on file prior to visit.    Review of Systems  Constitutional:  Negative for fever.  HENT:  Positive for  congestion. Negative for ear pain, postnasal drip and sore throat.   Respiratory:  Negative for cough, shortness of breath and wheezing.   Musculoskeletal:  Negative for back pain.       Left leg pain  Neurological:  Positive for headaches. Negative for dizziness, weakness, light-headedness and numbness.       Objective:   Vitals:   12/31/22 1443  BP: 128/76  Pulse: 75  Temp: 98.1 F (36.7 C)  SpO2: 96%   BP Readings from Last 3 Encounters:  12/31/22 128/76  11/27/22 128/72  09/09/22 124/76   Wt Readings from Last 3 Encounters:  12/31/22 212 lb (96.2 kg)  12/31/22 211 lb (95.7 kg)  11/27/22 211 lb (95.7 kg)   Body mass index is 31.31 kg/m.    Physical Exam Constitutional:      General: He is not in acute distress.    Appearance: Normal appearance. He is not ill-appearing.  HENT:     Head: Normocephalic.     Right Ear: Tympanic membrane, ear canal and external ear normal. There is no impacted cerumen.     Left Ear: Tympanic membrane, ear canal and external ear normal. There  is no impacted cerumen.     Mouth/Throat:     Mouth: Mucous membranes are moist.     Pharynx: No oropharyngeal exudate or posterior oropharyngeal erythema.  Eyes:     Conjunctiva/sclera: Conjunctivae normal.  Cardiovascular:     Rate and Rhythm: Normal rate and regular rhythm.  Pulmonary:     Effort: Pulmonary effort is normal. No respiratory distress.     Breath sounds: Normal breath sounds. No wheezing or rales.  Musculoskeletal:        General: No tenderness (no lower back tenderness, no leg tenderness).     Cervical back: Neck supple. No tenderness.  Lymphadenopathy:     Cervical: No cervical adenopathy.  Skin:    General: Skin is warm and dry.     Findings: No rash.  Neurological:     Mental Status: He is alert.     Sensory: No sensory deficit.     Motor: No weakness.     Comments: Pos straight leg raise on left            Assessment & Plan:    See Problem List for  Assessment and Plan of chronic medical problems.

## 2022-12-31 NOTE — Assessment & Plan Note (Signed)
Acute Left buttock to foot pain - worse with lifting leg No N/T or weakness, no lower back pain H/o similar symptoms Prednisone 40 mg daily x 5 days Back exercises If no improvement consider referral to sports medicine

## 2023-01-01 ENCOUNTER — Telehealth: Payer: Self-pay

## 2023-01-01 NOTE — Telephone Encounter (Signed)
Lmom to discuss echo results. Waiting on a return call.  

## 2023-01-01 NOTE — Telephone Encounter (Signed)
Spoke to pt. Pt was notified of Echo and stress test results. Pt will continue his current medication and f/u as planned.

## 2023-01-01 NOTE — Telephone Encounter (Signed)
Patient returned call

## 2023-01-16 ENCOUNTER — Ambulatory Visit: Payer: Medicare Other | Admitting: Internal Medicine

## 2023-01-16 ENCOUNTER — Other Ambulatory Visit: Payer: Self-pay | Admitting: Pharmacy Technician

## 2023-01-16 ENCOUNTER — Ambulatory Visit: Payer: Medicare Other | Attending: Internal Medicine | Admitting: Internal Medicine

## 2023-01-16 ENCOUNTER — Encounter: Payer: Self-pay | Admitting: Internal Medicine

## 2023-01-16 VITALS — BP 128/62 | HR 81 | Ht 70.0 in | Wt 206.2 lb

## 2023-01-16 DIAGNOSIS — I2510 Atherosclerotic heart disease of native coronary artery without angina pectoris: Secondary | ICD-10-CM

## 2023-01-16 DIAGNOSIS — T466X5D Adverse effect of antihyperlipidemic and antiarteriosclerotic drugs, subsequent encounter: Secondary | ICD-10-CM | POA: Diagnosis not present

## 2023-01-16 DIAGNOSIS — T466X5A Adverse effect of antihyperlipidemic and antiarteriosclerotic drugs, initial encounter: Secondary | ICD-10-CM

## 2023-01-16 DIAGNOSIS — E7800 Pure hypercholesterolemia, unspecified: Secondary | ICD-10-CM | POA: Diagnosis not present

## 2023-01-16 DIAGNOSIS — G720 Drug-induced myopathy: Secondary | ICD-10-CM | POA: Diagnosis not present

## 2023-01-16 DIAGNOSIS — E785 Hyperlipidemia, unspecified: Secondary | ICD-10-CM | POA: Diagnosis not present

## 2023-01-16 NOTE — Patient Instructions (Addendum)
Medication Instructions:  Your Leqvio injection is scheduled on _______ at ________. Please arrive at [facility] and enter the hospital through Entrance A (the [employer]) that's located at [employer] 15 minutes before your scheduled injection time. Walk in to the registration desk and let them know that you are scheduled for an injection in the infusion center. They will register you and take you to your appointment.    If you will need patient assistance, the injections will be administered by a health care provider at a [facility] off [address]    Wilber Bihari is a subcutaneous injection that will lower your LDL cholesterol by 50%. If this is your first injection, your next injection will be due in 3 months. If this is NOT your first injection, your next injection will be due in 6 months. If you have not heard from [facility] to schedule your next appointment within 2 weeks of your next anticipated injection, please call [facility] to schedule this at:                 #[phone] - if you are seen at the [facility] office               [phone] - if you are seen at the [facility] office   *If you need a refill on your cardiac medications before your next appointment, please call your pharmacy*  Follow-Up: At [facility], you and your health needs are our priority.  As part of our continuing mission to provide you with exceptional heart care, we have created designated Provider Care Teams.  These Care Teams include your primary Cardiologist (physician) and Advanced Practice Providers (APPs -  Physician Assistants and Nurse Practitioners) who all work together to provide you with the care you need, when you need it.  We recommend signing up for the patient portal called "MyChart".  Sign up information is provided on this After Visit Summary.  MyChart is used to connect with patients for Virtual Visits (Telemedicine).   Patients are able to view lab/test results, encounter notes, upcoming appointments, etc.  Non-urgent messages can be sent to your provider as well.   To learn more about what you can do with MyChart, go to [email or web address].    Your next appointment:   Dr. Rennis Golden recommends that you schedule a follow up visit with him the in the LIPID CLINIC in 6 months. Please have fasting blood work about 1 week prior to this visit and he will review the blood work results with you at your appointment.   Other Instructions We will work on getting you scheduled for your Leqvio injection

## 2023-01-16 NOTE — Progress Notes (Signed)
LIPID CLINIC CONSULT NOTE  Chief Complaint:  Follow-up dyslipidemia  Primary Care Physician: Georgina Quint, MD  Primary Cardiologist:  Bryan Lemma, MD  HPI:  Gregory Mathews. is a 77 y.o. male who is being seen today for the evaluation of lipidemia at the request of Georgina Quint, *.  This is a pleasant 77 year old male patient of Dr. Herbie Baltimore with a history of atherosclerotic cardiovascular disease and dyslipidemia.  Recent lipid showed total cholesterol 152, triglycerides 151, HDL 34 and LDL 83, down from 106.  He has had a previous calcium score which was 111, 64th percentile for age and sex matched controls.  Previously tried 4 different statins and Repatha, most recently, all of which caused muscle aches.  01/16/2023  Maker returns today for follow-up.  He had repeat labs and fortunately had a negative LP(a) at 16 nmol/L.  His LDL particle numbers however remain elevated at 1526 with an LDL-C of 102, HDL 42 and triglycerides 121.  He is only on ezetimibe.  We had discussed starting Leqvio and we were able to achieve prior authorization for this.  It would pretty much caused him nothing I believe to get the therapy however when he was contacted to schedule it he declined it because of "concerns for side effects".  I discussed it with him further today.  He does not recall what his concern was at the time.  After talking about it in more detail he now thinks that he would be amenable to getting the injections.  His approval is still active.  PMHx:  Past Medical History:  Diagnosis Date   Allergy    Basal cell carcinoma 01/2013   Nasal and R forearm.  [facility]   CAD S/P percutaneous coronary angioplasty    2/19 PCI/DESx1 to mLAD, FFR 0.7 -Sierra DES 2.75 mm x 18 mm   Cataract    Diabetes mellitus without complication    on PO Meds   Essential hypertension    Family history of colon cancer    Family history of malignant neoplasm of breast     Heart murmur    Hyperlipidemia    statin intolerant -- myalgias, fatigue (tried at least 4)   Polyposis of colon    Rosacea     Past Surgical History:  Procedure Laterality Date   basal cell removal     nose and wrist    BELPHAROPTOSIS REPAIR     CORONARY CT ANGIOGRAM  09/2017   Coronary Calcium Score: 111. Coronary calcification noted in the LEFT MAIN (LM) and prox LAD.  LM < 50% calcified stenosis.  Mid LAD 50-75% plaque noted CT FFR --> positive at 0.80.  Recommend CARDIAC CATHETERIZATION   CORONARY PRESSURE/FFR STUDY N/A 11/12/2017   Procedure: INTRAVASCULAR PRESSURE WIRE/FFR STUDY;  Surgeon: Marykay Lex, MD;  Location: [facility] INVASIVE CV LAB;  Service: Cardiovascular;  Laterality: LAD -FFR 0.76   CORONARY STENT INTERVENTION N/A 11/12/2017   Procedure: CORONARY STENT INTERVENTION;  Surgeon: Marykay Lex, MD;  Location: [facility] INVASIVE CV LAB;  Service: Cardiovascular:  DES PCI:  STENT SIERRA 2.75 X 18 MM - Post intervention, there is a 0% residual stenosis.   ETT/GXT: Graded Exercise Tolerance Test  08/2015    Exercised for 9:01 min --> blunted blood pressure response no EKG changes.  No chest pain.  Low Risk   EYE SURGERY     Cataract surgery   eyelid surgery     LEFT HEART CATH AND CORONARY  ANGIOGRAPHY N/A 11/12/2017   Procedure: LEFT HEART CATH AND CORONARY ANGIOGRAPHY;  Surgeon: Marykay Lex, MD;  Location: [facility] INVASIVE CV LAB: 70% P-M LAD (FFR 0.76).  40% mid LAD, 50% ostial ramus.  EF 55-60%.  Mildly elevated LVEDP.   SHOULDER OPEN ROTATOR CUFF REPAIR  1983   TRANSTHORACIC ECHOCARDIOGRAM  08/2017   EF 55-60%.  Mild aortic stenosis (mean gradient 11 mmH.)  GR 1 DD.  Mild LA dilation.    FAMHx:  Family History  Problem Relation Age of Onset   Leukemia Mother 16       d. 51   Stroke Father 63       as a complication of CABG   Colon cancer Father 63   Coronary artery disease Father 60       - Sx was DOE - MV CAD   Peripheral Artery Disease Father        presumptive    Diabetes Sister    Sudden Cardiac Death Sister 54       presumed MI   Heart attack Brother 64       During throat surgery   Congestive Heart Failure Brother        Died @ 71 - ? CHF / ICM   Breast cancer Maternal Grandmother        dx in her 5s   Stroke Maternal Grandfather    Diabetes Brother 74       on lots of meds   Diabetes Brother    Diabetes Paternal Grandmother    Rheumatic fever Brother        childhood RF --> Valvular Dz - valve Sgx,  died young   Stomach cancer Neg Hx    Esophageal cancer Neg Hx     SOCHx:   reports that he quit smoking about 40 years ago. His smoking use included cigarettes. He has a 15.00 pack-year smoking history. He has never used smokeless tobacco. He reports that he does not drink alcohol and does not use drugs.  ALLERGIES:  Allergies  Allergen Reactions   Januvia [Sitagliptin] Other (See Comments)    Gi upset and kidney problems   Lisinopril Cough   Repatha [Evolocumab] Other (See Comments)    Myalgias    Statins Other (See Comments)    Severe leg pain & fatigue -- tried at least 4 (even low dose)    ROS: Pertinent items noted in HPI and remainder of comprehensive ROS otherwise negative.  HOME MEDS: Current Outpatient Medications on File Prior to Visit  Medication Sig Dispense Refill   amoxicillin-clavulanate (AUGMENTIN) 875-125 MG tablet Take 1 tablet by mouth 2 (two) times daily. 20 tablet 0   blood glucose meter kit and supplies Use to test blood sugar daily. Dx: E11.9 1 each 0   clopidogrel (PLAVIX) 75 MG tablet Take 1 tablet (75 mg total) by mouth daily. 90 tablet 3   clotrimazole-betamethasone (LOTRISONE) cream APPLY 1 APPLICATION TOPICALLY DAILY 30 g 0   ezetimibe (ZETIA) 10 MG tablet TAKE 1 TABLET BY MOUTH EVERY DAY 90 tablet 1   glipiZIDE (GLUCOTROL) 5 MG tablet Take 1 tablet (5 mg total) by mouth 2 (two) times daily before a meal. 180 tablet 3   Lancets MISC Use to test blood sugar daily. Dx code :E11.9 100 each 3    losartan (COZAAR) 50 MG tablet Take 1 tablet (50 mg total) by mouth daily. 90 tablet 3   metoprolol succinate (TOPROL-XL) 25 MG 24 hr tablet Take  1 tablet (25 mg total) by mouth daily. 90 tablet 3   Multiple Vitamin (MULTIVITAMIN) tablet Take 1 tablet by mouth daily.     ONETOUCH ULTRA test strip USE TO TEST BLOOD SUGAR ONCE DAILY 100 strip 11   No current facility-administered medications on file prior to visit.    LABS/IMAGING: No results found for this or any previous visit (from the past 48 hour(s)). No results found.  LIPID PANEL:    Component Value Date/Time   CHOL 160 09/09/2022 0935   CHOL 86 (L) 11/16/2020 0857   TRIG 130.0 09/09/2022 0935   HDL 40.90 09/09/2022 0935   HDL 41 11/16/2020 0857   CHOLHDL 4 09/09/2022 0935   VLDL 26.0 09/09/2022 0935   LDLCALC 93 09/09/2022 0935   LDLCALC 24 11/16/2020 0857    WEIGHTS: Wt Readings from Last 3 Encounters:  01/16/23 206 lb 3.2 oz (93.5 kg)  12/31/22 212 lb (96.2 kg)  12/31/22 211 lb (95.7 kg)    VITALS: BP 128/62 (BP Location: Left Arm, Patient Position: Sitting, Cuff Size: Large)   Pulse 81   Ht 5\' 10"  (1.778 m)   Wt 206 lb 3.2 oz (93.5 kg)   SpO2 97%   BMI 29.59 kg/m   EXAM: Deferred  EKG: Deferred  ASSESSMENT: Mixed dyslipidemia, goal LDL <70 Hypertension Myalgia on statins Myalgia on Repatha  PLAN: 1.   Mr. Rack was approved for Leqvio however declined scheduling the injections because of concerns about side effects.  After describing the medicines more to him and possible side effects and issues today which are minimal, he is now agreeable to that.  We will go ahead and try to get him scheduled since his authorization is still valid and the cost should be minimal to 0 for him.  Will try to see if we can arrange for follow-up lipids in about 4 to 6 months after the 2 initial shots.  Chrystie Nose, MD, [facility], FACP  [place]  [facility]  Medical Director of the Advanced Lipid Disorders &   Cardiovascular Risk Reduction Clinic Diplomate of the American Board of Clinical Lipidology Attending Cardiologist  Direct Dial: [phone]  Fax: [phone]  Website:  [email or web address]  Chrystie Nose 01/16/2023, 2:51 PM

## 2023-01-20 ENCOUNTER — Telehealth: Payer: Self-pay | Admitting: Cardiology

## 2023-01-20 NOTE — Telephone Encounter (Signed)
error 

## 2023-01-22 ENCOUNTER — Ambulatory Visit (INDEPENDENT_AMBULATORY_CARE_PROVIDER_SITE_OTHER): Payer: Medicare Other

## 2023-01-22 VITALS — BP 122/71 | HR 74 | Temp 97.9°F | Resp 16 | Ht 69.0 in | Wt 209.4 lb

## 2023-01-22 DIAGNOSIS — E785 Hyperlipidemia, unspecified: Secondary | ICD-10-CM | POA: Diagnosis not present

## 2023-01-22 DIAGNOSIS — I2510 Atherosclerotic heart disease of native coronary artery without angina pectoris: Secondary | ICD-10-CM

## 2023-01-22 DIAGNOSIS — E1169 Type 2 diabetes mellitus with other specified complication: Secondary | ICD-10-CM

## 2023-01-22 MED ORDER — INCLISIRAN SODIUM 284 MG/1.5ML ~~LOC~~ SOSY
284.0000 mg | PREFILLED_SYRINGE | Freq: Once | SUBCUTANEOUS | Status: AC
  Administered 2023-01-22: 284 mg via SUBCUTANEOUS
  Filled 2023-01-22: qty 1.5

## 2023-01-22 NOTE — Patient Instructions (Signed)
Inclisiran Injection What is this medication? INCLISIRAN (in kli SIR an) treats high cholesterol. It works by decreasing bad cholesterol (such as LDL) in your blood. Changes to diet and exercise are often combined with this medication. This medicine may be used for other purposes; ask your health care provider or pharmacist if you have questions. COMMON BRAND NAME(S): LEQVIO What should I tell my care team before I take this medication? They need to know if you have any of these conditions: An unusual or allergic reaction to inclisiran, other medications, foods, dyes, or preservatives Pregnant or trying to get pregnant Breast-feeding How should I use this medication? This medication is injected under the skin. It is given by your care team in a hospital or clinic setting. Talk to your care team about the use of this medication in children. Special care may be needed. Overdosage: If you think you have taken too much of this medicine contact a poison control center or emergency room at once. NOTE: This medicine is only for you. Do not share this medicine with others. What if I miss a dose? Keep appointments for follow-up doses. It is important not to miss your dose. Call your care team if you are unable to keep an appointment. What may interact with this medication? Interactions are not expected. This list may not describe all possible interactions. Give your health care provider a list of all the medicines, herbs, non-prescription drugs, or dietary supplements you use. Also tell them if you smoke, drink alcohol, or use illegal drugs. Some items may interact with your medicine. What should I watch for while using this medication? Visit your care team for regular checks on your progress. Tell your care team if your symptoms do not start to get better or if they get worse. You may need blood work while you are taking this medication. What side effects may I notice from receiving this  medication? Side effects that you should report to your care team as soon as possible: Allergic reactions--skin rash, itching, hives, swelling of the face, lips, tongue, or throat Side effects that usually do not require medical attention (report these to your care team if they continue or are bothersome): Joint pain Pain, redness, or irritation at injection site This list may not describe all possible side effects. Call your doctor for medical advice about side effects. You may report side effects to FDA at 1-800-FDA-1088. Where should I keep my medication? This medication is given in a hospital or clinic. It will not be stored at home. NOTE: This sheet is a summary. It may not cover all possible information. If you have questions about this medicine, talk to your doctor, pharmacist, or health care provider.  2023 Elsevier/Gold Standard (2020-09-27 00:00:00)  

## 2023-01-22 NOTE — Progress Notes (Signed)
Diagnosis: Hyperlipidemia  Provider:  Chilton Greathouse MD  Procedure: Injection  Leqvio (inclisiran), Dose: 284 mg, Site: subcutaneous, Number of injections: 1  Post Care: Observation period completed  Discharge: Condition: Good, Destination: Home . AVS Provided  Performed by:  Nat Math, RN

## 2023-02-03 ENCOUNTER — Ambulatory Visit (INDEPENDENT_AMBULATORY_CARE_PROVIDER_SITE_OTHER): Payer: Medicare Other | Admitting: Internal Medicine

## 2023-02-03 ENCOUNTER — Encounter: Payer: Self-pay | Admitting: Internal Medicine

## 2023-02-03 ENCOUNTER — Ambulatory Visit (INDEPENDENT_AMBULATORY_CARE_PROVIDER_SITE_OTHER): Payer: Medicare Other | Admitting: Sports Medicine

## 2023-02-03 ENCOUNTER — Ambulatory Visit: Payer: Medicare Other | Admitting: Internal Medicine

## 2023-02-03 VITALS — BP 128/78 | HR 65 | Temp 98.2°F | Ht 69.0 in | Wt 211.0 lb

## 2023-02-03 VITALS — BP 128/78 | HR 47 | Ht 69.0 in | Wt 212.0 lb

## 2023-02-03 DIAGNOSIS — I152 Hypertension secondary to endocrine disorders: Secondary | ICD-10-CM

## 2023-02-03 DIAGNOSIS — G8929 Other chronic pain: Secondary | ICD-10-CM

## 2023-02-03 DIAGNOSIS — M25562 Pain in left knee: Secondary | ICD-10-CM

## 2023-02-03 DIAGNOSIS — Z7984 Long term (current) use of oral hypoglycemic drugs: Secondary | ICD-10-CM

## 2023-02-03 DIAGNOSIS — E1159 Type 2 diabetes mellitus with other circulatory complications: Secondary | ICD-10-CM | POA: Diagnosis not present

## 2023-02-03 NOTE — Progress Notes (Signed)
Subjective:    Patient ID: Gregory Bologna., male    DOB: [birth year], 77 y.o.   MRN: [id number]      HPI Gregory Mathews is here for  Chief Complaint  Patient presents with   Leg Pain    Trouble lifting left leg still     Left leg feels heavy -  it is better than it was.  Symptoms started just over a month ago.  At 1 point he was doing a lot of carrying things upstairs that may have been too heavy and he wonders if that is what caused this.    The pain is localized to the left knee.  If he is sitting and he lifts his leg he feels pain in the knee and it radiates up the leg.  He denies any pain with walking.  Moving around and stretching seems to help.  No swelling.  No N/T.  No lower leg pain.  He has taken Tylenol   Medications and allergies reviewed with patient and updated if appropriate.  Current Outpatient Medications on File Prior to Visit  Medication Sig Dispense Refill   amoxicillin-clavulanate (AUGMENTIN) 875-125 MG tablet Take 1 tablet by mouth 2 (two) times daily. 20 tablet 0   blood glucose meter kit and supplies Use to test blood sugar daily. Dx: E11.9 1 each 0   clopidogrel (PLAVIX) 75 MG tablet Take 1 tablet (75 mg total) by mouth daily. 90 tablet 3   clotrimazole-betamethasone (LOTRISONE) cream APPLY 1 APPLICATION TOPICALLY DAILY 30 g 0   ezetimibe (ZETIA) 10 MG tablet TAKE 1 TABLET BY MOUTH EVERY DAY 90 tablet 1   glipiZIDE (GLUCOTROL) 5 MG tablet Take 1 tablet (5 mg total) by mouth 2 (two) times daily before a meal. 180 tablet 3   Lancets MISC Use to test blood sugar daily. Dx code :E11.9 100 each 3   losartan (COZAAR) 50 MG tablet Take 1 tablet (50 mg total) by mouth daily. 90 tablet 3   metoprolol succinate (TOPROL-XL) 25 MG 24 hr tablet Take 1 tablet (25 mg total) by mouth daily. 90 tablet 3   Multiple Vitamin (MULTIVITAMIN) tablet Take 1 tablet by mouth daily.     ONETOUCH ULTRA test strip USE TO TEST BLOOD SUGAR ONCE DAILY 100 strip 11   No current  facility-administered medications on file prior to visit.    Review of Systems     Objective:   Vitals:   02/03/23 1037  BP: 128/78  Pulse: 65  Temp: 98.2 F (36.8 C)  SpO2: 96%   BP Readings from Last 3 Encounters:  02/03/23 128/78  01/22/23 122/71  01/16/23 128/62   Wt Readings from Last 3 Encounters:  02/03/23 211 lb (95.7 kg)  01/22/23 209 lb 6.4 oz (95 kg)  01/16/23 206 lb 3.2 oz (93.5 kg)   Body mass index is 31.16 kg/m.    Physical Exam    A right knee exam was performed.   SKIN: intact, no bruising or skin discoloration SWELLING: None EFFUSION: No WARMTH: no warmth  TENDERNESS: Mild tenderness with palpation patella tibial tendon and kneecap ROM: full extension, full flexion  GAIT: Normal NEUROLOGICAL EXAM: normal sensation  CALF TENDERNESS: no       Assessment & Plan:    Left knee pain: Acute Possibly related to carrying heavy things up and down stairs several weeks ago Slight improvement with prednisone, but it did not last Pain with lifting leg when sitting and trying to put left foot  up onto her right knee No pain with walking Some tenderness on exam Continue Tylenol Referral to sports medicine for further evaluation  Hypertension: Chronic Blood pressure well-controlled here today Okay to take time-avoid NSAIDs given blood pressure and Plavix Continue losartan 50 mg daily, metoprolol XL 25 mg daily

## 2023-02-03 NOTE — Patient Instructions (Signed)
     Make an appointment with sports medicine for left knee pain.      Medications changes include :   none    A referral was ordered for sports medicine.

## 2023-02-03 NOTE — Progress Notes (Signed)
Aleen Sells D.[facility] [address] [place] [address] Phone: [phone]   Assessment and Plan:     1. Chronic pain of left knee  -Chronic with exacerbation, initial sports medicine visit - Recurrence of left knee pain over the past 1 month after having a weekend of heavy physical activity and lifting.  Suspect flare of osteoarthritis based on age and physical exam - Patient had relief while on prednisone, however pain returned after discontinuing prednisone - Do not recommend NSAID use due to chronic use of Plavix - Continue Tylenol as needed for day-to-day pain relief - Start HEP for knee - Patient elected for intra-articular knee CSI.  CSI may temporarily increase blood glucose in patient with past medical history of DM type II  Procedure: Knee Joint Injection Side: Left Indication: Exacerbation of chronic knee pain  Risks explained and consent was given verbally. The site was cleaned with alcohol prep. A needle was introduced with an anterio-lateral approach. Injection given using 2mL of 1% lidocaine without epinephrine and 1mL of kenalog 40mg /ml. This was well tolerated and resulted in symptomatic relief.  Needle was removed, hemostasis achieved, and post injection instructions were explained.   Pt was advised to call or return to clinic if these symptoms worsen or fail to improve as anticipated.   Pertinent previous records reviewed include left knee x-ray 2021   Follow Up: 3 to 4 weeks for reevaluation.  If no improvement or worsening of symptoms, could consider HA versus Zilretta injection versus treatment for patellar tendinitis versus repeat x-ray   Subjective:   I, Moenique Parris, am serving as a Neurosurgeon for Doctor Richardean Sale  Chief Complaint: left knee pain   HPI:   02/03/23 Patient is a 77 year old male complaining of left knee pain. Patient states that he can't lift his leg , pain starts the knee and goes up, pain  for about a month and a half, no MOI, thinks he over did picking up and moving things, no meds for the pain, no numbness or tingling, no pain when walking, he he interally and externally rotates his knee he has pain,   Relevant Historical Information: DM type II, hypertension, CAD  Additional pertinent review of systems negative.   Current Outpatient Medications:    blood glucose meter kit and supplies, Use to test blood sugar daily. Dx: E11.9, Disp: 1 each, Rfl: 0   clopidogrel (PLAVIX) 75 MG tablet, Take 1 tablet (75 mg total) by mouth daily., Disp: 90 tablet, Rfl: 3   clotrimazole-betamethasone (LOTRISONE) cream, APPLY 1 APPLICATION TOPICALLY DAILY, Disp: 30 g, Rfl: 0   ezetimibe (ZETIA) 10 MG tablet, TAKE 1 TABLET BY MOUTH EVERY DAY, Disp: 90 tablet, Rfl: 1   glipiZIDE (GLUCOTROL) 5 MG tablet, Take 1 tablet (5 mg total) by mouth 2 (two) times daily before a meal., Disp: 180 tablet, Rfl: 3   Lancets MISC, Use to test blood sugar daily. Dx code :E11.9, Disp: 100 each, Rfl: 3   losartan (COZAAR) 50 MG tablet, Take 1 tablet (50 mg total) by mouth daily., Disp: 90 tablet, Rfl: 3   metoprolol succinate (TOPROL-XL) 25 MG 24 hr tablet, Take 1 tablet (25 mg total) by mouth daily., Disp: 90 tablet, Rfl: 3   Multiple Vitamin (MULTIVITAMIN) tablet, Take 1 tablet by mouth daily., Disp: , Rfl:    ONETOUCH ULTRA test strip, USE TO TEST BLOOD SUGAR ONCE DAILY, Disp: 100 strip, Rfl: 11   Objective:  Vitals:   02/03/23 1427  BP: 128/78  Pulse: (!) 47  SpO2: 98%  Weight: 212 lb (96.2 kg)  Height: 5\' 9"  (1.753 m)      Body mass index is 31.31 kg/m.    Physical Exam:    General:  awake, alert oriented, no acute distress nontoxic Skin: no suspicious lesions or rashes Neuro:sensation intact and strength 5/5 with no deficits, no atrophy, normal muscle tone Psych: No signs of anxiety, depression or other mood disorder  Left knee: No swelling No deformity Neg fluid wave, joint milking ROM  Flex 110, Ext 0 TTP patellar tendon, medial joint line NTTP over the quad tendon, medial fem condyle, lat fem condyle, patella,   tibial tuberostiy, fibular head, posterior fossa, pes anserine bursa, gerdy's tubercle,  lateral jt line Neg anterior and posterior drawer Neg lachman Neg sag sign Negative varus stress Negative valgus stress Negative McMurray for palpable pop, though reproduced pain Unable to perform Thessaly due to knee pain  Gait normal    Electronically signed by:  Aleen Sells [facility] 3:07 PM 02/03/23

## 2023-02-03 NOTE — Addendum Note (Signed)
Addended by: Richardean Sale on: 02/03/2023 03:08 PM   Modules accepted: Level of Service

## 2023-02-03 NOTE — Patient Instructions (Addendum)
Recommend elliptical , stationary bike , and water aerobics  Knee HEP  Tylenol [phone] mg 2-3 times a day for pain relief  3-4 week follow up

## 2023-02-07 ENCOUNTER — Other Ambulatory Visit: Payer: Self-pay | Admitting: Emergency Medicine

## 2023-02-07 DIAGNOSIS — R21 Rash and other nonspecific skin eruption: Secondary | ICD-10-CM

## 2023-02-07 DIAGNOSIS — E1159 Type 2 diabetes mellitus with other circulatory complications: Secondary | ICD-10-CM

## 2023-02-07 DIAGNOSIS — E1165 Type 2 diabetes mellitus with hyperglycemia: Secondary | ICD-10-CM

## 2023-02-18 NOTE — Progress Notes (Signed)
Gregory Mathews [facility] [address] [place] [address] Phone: [phone]   Assessment and Plan:     1. Left hip pain -Chronic with exacerbation, initial sports medicine visit - Most consistent with flares of mild osteoarthritis.  Possibly exacerbated by statin medication as patient has experienced similar pain with statin use in the past - X-ray obtained in clinic.  My interpretation: No acute fracture or dislocation.  Mild decrease in joint space, sclerosis of femoral acetabular joint -- Start Tylenol 500 to 1000 mg tablets 2-3 times a day for day-to-day pain relief - Start HEP.  Offered physical therapy which patient declined at this time, though could be considered at future visit - Do not recommend NSAID use with chronic use of Plavix.  Do not recommend regular prednisone use with history of DM type II - DG HIP UNILAT WITH PELVIS 2-3 VIEWS LEFT; Future  2. Chronic pain of left knee - Chronic with exacerbation, subsequent visit - Overall moderate improvement in left knee pain after intra-articular knee CSI performed at previous office visit on 02/03/2023.  Patient has decreased knee pain, though continues to have knee stiffness after prolonged inactivity consistent with osteoarthritis - May use Tylenol as needed for day-to-day pain relief - Continue HEP for knee.  Offered physical therapy which patient declined at this time, though could be considered at future visit - Do not recommend NSAID use with chronic use of Plavix.  Do not recommend regular prednisone use with history of DM type II  Pertinent previous records reviewed include none   Follow Up: 4 to 6 weeks for reevaluation.  Could consider physical therapy if no improvement.  Could consider alternative knee injection versus hip CSI   Subjective:   I, Gregory Mathews, am serving as a Neurosurgeon for Doctor Richardean Sale   Chief Complaint: left knee pain    HPI:     02/03/23 Patient is a 77 year old male complaining of left knee pain. Patient states that he can't lift his leg , pain starts the knee and goes up, pain for about a month and a half, no MOI, thinks he over did picking up and moving things, no meds for the pain, no numbness or tingling, no pain when walking, he he interally and externally rotates his knee he has pain,   02/25/2023 Patient states knee has improved but he is not able to lift his leg , think it is from a shot her received 2 months ago a "statin shot"    Relevant Historical Information: DM type II, hypertension, CAD  Additional pertinent review of systems negative.   Current Outpatient Medications:    blood glucose meter kit and supplies, Use to test blood sugar daily. Dx: E11.9, Disp: 1 each, Rfl: 0   clopidogrel (PLAVIX) 75 MG tablet, Take 1 tablet (75 mg total) by mouth daily., Disp: 90 tablet, Rfl: 3   clotrimazole-betamethasone (LOTRISONE) cream, APPLY 1 APPLICATION TOPICALLY DAILY, Disp: 30 g, Rfl: 0   ezetimibe (ZETIA) 10 MG tablet, Take 1 tablet (10 mg total) by mouth daily., Disp: 90 tablet, Rfl: 1   glipiZIDE (GLUCOTROL) 5 MG tablet, Take 1 tablet (5 mg total) by mouth 2 (two) times daily before a meal., Disp: 180 tablet, Rfl: 3   Lancets MISC, Use to test blood sugar daily. Dx code :E11.9, Disp: 100 each, Rfl: 3   losartan (COZAAR) 50 MG tablet, Take 1 tablet (50 mg total) by mouth daily., Disp: 90 tablet,  Rfl: 3   metoprolol succinate (TOPROL-XL) 25 MG 24 hr tablet, Take 1 tablet (25 mg total) by mouth daily., Disp: 90 tablet, Rfl: 3   Multiple Vitamin (MULTIVITAMIN) tablet, Take 1 tablet by mouth daily., Disp: , Rfl:    ONETOUCH ULTRA test strip, USE TO TEST BLOOD SUGAR ONCE DAILY, Disp: 100 strip, Rfl: 11   Semaglutide,0.25 or 0.5MG /DOS, 2 MG/3ML SOPN, Inject 0.5 mg into the skin once a week., Disp: 3 mL, Rfl: 5   Objective:     Vitals:   02/25/23 0813  BP: 130/82  Pulse: 64  SpO2: 96%  Weight: 208 lb (94.3  kg)  Height: 5\' 9"  (1.753 m)      Body mass index is 30.72 kg/m.    Physical Exam:    General: awake, alert, and oriented no acute distress, nontoxic Skin: no suspicious lesions or rashes Neuro:sensation intact distally with no deficits, normal muscle tone, no atrophy, strength 5/5 in all tested lower ext groups Psych: normal mood and affect, speech clear   Left hip:  No deformity, swelling or wasting ROM Flexion 90, ext 30, IR 30, ER 40 NTTP over the hip flexors, greater trochanter, gluteal musculature, si joint, lumbar spine Negative log roll with FROM Negative FABER Positive FADIR for groin pain Negative Piriformis test Negative trendelenberg Gait normal    Electronically signed by:  Gregory Mathews [facility] 9:20 AM 02/25/23

## 2023-02-24 ENCOUNTER — Encounter: Payer: Self-pay | Admitting: Emergency Medicine

## 2023-02-24 ENCOUNTER — Ambulatory Visit (INDEPENDENT_AMBULATORY_CARE_PROVIDER_SITE_OTHER): Payer: Medicare Other | Admitting: Emergency Medicine

## 2023-02-24 ENCOUNTER — Ambulatory Visit: Payer: Medicare Other | Admitting: Emergency Medicine

## 2023-02-24 VITALS — BP 130/82 | HR 65 | Temp 98.0°F | Ht 69.0 in | Wt 208.1 lb

## 2023-02-24 DIAGNOSIS — E1165 Type 2 diabetes mellitus with hyperglycemia: Secondary | ICD-10-CM | POA: Diagnosis not present

## 2023-02-24 DIAGNOSIS — E785 Hyperlipidemia, unspecified: Secondary | ICD-10-CM

## 2023-02-24 DIAGNOSIS — E1169 Type 2 diabetes mellitus with other specified complication: Secondary | ICD-10-CM

## 2023-02-24 DIAGNOSIS — G720 Drug-induced myopathy: Secondary | ICD-10-CM | POA: Diagnosis not present

## 2023-02-24 DIAGNOSIS — T466X5A Adverse effect of antihyperlipidemic and antiarteriosclerotic drugs, initial encounter: Secondary | ICD-10-CM

## 2023-02-24 DIAGNOSIS — I700 Atherosclerosis of aorta: Secondary | ICD-10-CM

## 2023-02-24 DIAGNOSIS — E1159 Type 2 diabetes mellitus with other circulatory complications: Secondary | ICD-10-CM

## 2023-02-24 DIAGNOSIS — I25119 Atherosclerotic heart disease of native coronary artery with unspecified angina pectoris: Secondary | ICD-10-CM | POA: Diagnosis not present

## 2023-02-24 DIAGNOSIS — I152 Hypertension secondary to endocrine disorders: Secondary | ICD-10-CM | POA: Diagnosis not present

## 2023-02-24 LAB — POCT GLYCOSYLATED HEMOGLOBIN (HGB A1C): Hemoglobin A1C: 7.4 % — AB (ref 4.0–5.6)

## 2023-02-24 MED ORDER — GLIPIZIDE 5 MG PO TABS
5.0000 mg | ORAL_TABLET | Freq: Two times a day (BID) | ORAL | 3 refills | Status: DC
Start: 2023-02-24 — End: 2024-02-02

## 2023-02-24 MED ORDER — EZETIMIBE 10 MG PO TABS: 10.0000 mg | ORAL_TABLET | Freq: Every day | ORAL | 1 refills | Status: DC

## 2023-02-24 MED ORDER — SEMAGLUTIDE(0.25 OR 0.5MG/DOS) 2 MG/3ML ~~LOC~~ SOPN
0.5000 mg | PEN_INJECTOR | SUBCUTANEOUS | 5 refills | Status: DC
Start: 2023-02-24 — End: 2023-12-29

## 2023-02-24 NOTE — Patient Instructions (Signed)
Health Maintenance After Age 75 After age 75, you are at a higher risk for certain long-term diseases and infections as well as injuries from falls. Falls are a major cause of broken bones and head injuries in people who are older than age 75. Getting regular preventive care can help to keep you healthy and well. Preventive care includes getting regular testing and making lifestyle changes as recommended by your health care provider. Talk with your health care provider about: Which screenings and tests you should have. A screening is a test that checks for a disease when you have no symptoms. A diet and exercise plan that is right for you. What should I know about screenings and tests to prevent falls? Screening and testing are the best ways to find a health problem early. Early diagnosis and treatment give you the best chance of managing medical conditions that are common after age 75. Certain conditions and lifestyle choices may make you more likely to have a fall. Your health care provider may recommend: Regular vision checks. Poor vision and conditions such as cataracts can make you more likely to have a fall. If you wear glasses, make sure to get your prescription updated if your vision changes. Medicine review. Work with your health care provider to regularly review all of the medicines you are taking, including over-the-counter medicines. Ask your health care provider about any side effects that may make you more likely to have a fall. Tell your health care provider if any medicines that you take make you feel dizzy or sleepy. Strength and balance checks. Your health care provider may recommend certain tests to check your strength and balance while standing, walking, or changing positions. Foot health exam. Foot pain and numbness, as well as not wearing proper footwear, can make you more likely to have a fall. Screenings, including: Osteoporosis screening. Osteoporosis is a condition that causes  the bones to get weaker and break more easily. Blood pressure screening. Blood pressure changes and medicines to control blood pressure can make you feel dizzy. Depression screening. You may be more likely to have a fall if you have a fear of falling, feel depressed, or feel unable to do activities that you used to do. Alcohol use screening. Using too much alcohol can affect your balance and may make you more likely to have a fall. Follow these instructions at home: Lifestyle Do not drink alcohol if: Your health care provider tells you not to drink. If you drink alcohol: Limit how much you have to: 0-1 drink a day for women. 0-2 drinks a day for men. Know how much alcohol is in your drink. In the [place] one drink equals one 12 oz bottle of beer (355 mL), one 5 oz glass of wine (148 mL), or one 1 oz glass of hard liquor (44 mL). Do not use any products that contain nicotine or tobacco. These products include cigarettes, chewing tobacco, and vaping devices, such as e-cigarettes. If you need help quitting, ask your health care provider. Activity  Follow a regular exercise program to stay fit. This will help you maintain your balance. Ask your health care provider what types of exercise are appropriate for you. If you need a cane or walker, use it as recommended by your health care provider. Wear supportive shoes that have nonskid soles. Safety  Remove any tripping hazards, such as rugs, cords, and clutter. Install safety equipment such as grab bars in bathrooms and safety rails on stairs. Keep rooms and walkways   well-lit. General instructions Talk with your health care provider about your risks for falling. Tell your health care provider if: You fall. Be sure to tell your health care provider about all falls, even ones that seem minor. You feel dizzy, tiredness (fatigue), or off-balance. Take over-the-counter and prescription medicines only as told by your health care provider. These include  supplements. Eat a healthy diet and maintain a healthy weight. A healthy diet includes low-fat dairy products, low-fat (lean) meats, and fiber from whole grains, beans, and lots of fruits and vegetables. Stay current with your vaccines. Schedule regular health, dental, and eye exams. Summary Having a healthy lifestyle and getting preventive care can help to protect your health and wellness after age 75. Screening and testing are the best way to find a health problem early and help you avoid having a fall. Early diagnosis and treatment give you the best chance for managing medical conditions that are more common for people who are older than age 75. Falls are a major cause of broken bones and head injuries in people who are older than age 75. Take precautions to prevent a fall at home. Work with your health care provider to learn what changes you can make to improve your health and wellness and to prevent falls. This information is not intended to replace advice given to you by your health care provider. Make sure you discuss any questions you have with your health care provider. Document Revised: 01/29/2021 Document Reviewed: 01/29/2021 Else[employer] Patient Education  2024 [employer]  

## 2023-02-24 NOTE — Assessment & Plan Note (Signed)
Intolerant to statins Continue Zetia 10 mg Was given 67-month injection for cholesterol control but having musculoskeletal side effects

## 2023-02-24 NOTE — Progress Notes (Signed)
Maclain Teresa Coombs. 77 y.o.   Chief Complaint  Patient presents with   Medical Management of Chronic Issues    F/u appt DM, patient has questions about his diabetic and cholesterol medication    HISTORY OF PRESENT ILLNESS: This is a 77 y.o. male here for 48-month follow-up of chronic medical conditions including diabetes Complaining of lower leg soreness since taking cholesterol medication injection couple months ago Inquiring about Ozempic.  Would like to try it No other complaints or medical concerns today.  HPI   Prior to Admission medications   Medication Sig Start Date End Date Taking? Authorizing Provider  blood glucose meter kit and supplies Use to test blood sugar daily. Dx: E11.9 10/11/15  Yes Ofilia Neas, PA-C  clopidogrel (PLAVIX) 75 MG tablet Take 1 tablet (75 mg total) by mouth daily. 11/27/22  Yes Monge, Petra Kuba, NP  clotrimazole-betamethasone (LOTRISONE) cream APPLY 1 APPLICATION TOPICALLY DAILY 02/07/23  Yes Oslo Huntsman, Eilleen Kempf, MD  glipiZIDE (GLUCOTROL) 5 MG tablet Take 1 tablet (5 mg total) by mouth 2 (two) times daily before a meal. 12/03/22 11/28/23 Yes Tavin Vernet, Eilleen Kempf, MD  Lancets MISC Use to test blood sugar daily. Dx code :E11.9 07/08/19  Yes Yavonne Kiss, Eilleen Kempf, MD  losartan (COZAAR) 50 MG tablet Take 1 tablet (50 mg total) by mouth daily. 03/04/22 02/27/23 Yes Kalee Mcclenathan, Eilleen Kempf, MD  metoprolol succinate (TOPROL-XL) 25 MG 24 hr tablet Take 1 tablet (25 mg total) by mouth daily. 11/27/22  Yes Monge, Petra Kuba, NP  Multiple Vitamin (MULTIVITAMIN) tablet Take 1 tablet by mouth daily.   Yes [provider]  [facility] ULTRA test strip USE TO TEST BLOOD SUGAR ONCE DAILY 02/07/23  Yes Oral Remache, Eilleen Kempf, MD  ezetimibe (ZETIA) 10 MG tablet TAKE 1 TABLET BY MOUTH EVERY DAY Patient not taking: Reported on 02/24/2023 12/08/22   Georgina Quint, MD    Allergies  Allergen Reactions   Januvia [Sitagliptin] Other (See Comments)    Gi upset and kidney  problems   Lisinopril Cough   Repatha [Evolocumab] Other (See Comments)    Myalgias    Statins Other (See Comments)    Severe leg pain & fatigue -- tried at least 4 (even low dose)    Patient Active Problem List   Diagnosis Date Noted   Left lumbar radiculopathy 12/31/2022   Acute non-recurrent pansinusitis 06/24/2022   CAD (coronary artery disease) 06/11/2022   Atherosclerosis of aorta (HCC) 02/15/2021   Family history of colon cancer    Family history of malignant neoplasm of breast    Polyposis of colon    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   Clotting disorder (HCC) 05/17/2020   Skin cancer, basal cell 11/18/2019   Uncircumcised male 03/11/2018   Coronary artery disease involving native coronary artery of native heart with angina pectoris (HCC) 11/04/2017   Abnormal findings on diagnostic imaging of cardiovascular system 11/04/2017   Agatston coronary artery calcium score between 100 and 199 09/22/2017   Family history of early CAD 09/11/2017   Mild aortic stenosis by prior echocardiogram 09/11/2017   Metabolic syndrome 09/11/2017   Hypogonadism in male 08/14/2016   BMI 31.0-31.9,adult 03/13/2016   Hypertension associated with diabetes (HCC) 08/29/2015   Diverticulosis of colon without hemorrhage 08/01/2015   Statin intolerance 08/01/2015   Basal cell carcinoma of nose 08/23/2014   Seasonal allergies 01/26/2013   Type 2 diabetes mellitus with hyperglycemia, without long-term current use of insulin (HCC) 07/09/2012   Hyperlipidemia associated with type 2 diabetes mellitus (  HCC) 07/09/2012   Erectile dysfunction 07/09/2012    Past Medical History:  Diagnosis Date   Allergy    Basal cell carcinoma 01/2013   Nasal and R forearm.  [facility]   CAD S/P percutaneous coronary angioplasty    2/19 PCI/DESx1 to mLAD, FFR 0.7 -Sierra DES 2.75 mm x 18 mm   Cataract    Diabetes mellitus without complication (HCC)    on PO Meds   Essential hypertension    Family history of  colon cancer    Family history of malignant neoplasm of breast    Heart murmur    Hyperlipidemia    statin intolerant -- myalgias, fatigue (tried at least 4)   Polyposis of colon    Rosacea     Past Surgical History:  Procedure Laterality Date   basal cell removal     nose and wrist    BELPHAROPTOSIS REPAIR     CORONARY CT ANGIOGRAM  09/2017   Coronary Calcium Score: 111. Coronary calcification noted in the LEFT MAIN (LM) and prox LAD.  LM < 50% calcified stenosis.  Mid LAD 50-75% plaque noted CT FFR --> positive at 0.80.  Recommend CARDIAC CATHETERIZATION   CORONARY PRESSURE/FFR STUDY N/A 11/12/2017   Procedure: INTRAVASCULAR PRESSURE WIRE/FFR STUDY;  Surgeon: Marykay Lex, MD;  Location: [facility] INVASIVE CV LAB;  Service: Cardiovascular;  Laterality: LAD -FFR 0.76   CORONARY STENT INTERVENTION N/A 11/12/2017   Procedure: CORONARY STENT INTERVENTION;  Surgeon: Marykay Lex, MD;  Location: [facility] INVASIVE CV LAB;  Service: Cardiovascular:  DES PCI:  STENT SIERRA 2.75 X 18 MM - Post intervention, there is a 0% residual stenosis.   ETT/GXT: Graded Exercise Tolerance Test  08/2015    Exercised for 9:01 min --> blunted blood pressure response no EKG changes.  No chest pain.  Low Risk   EYE SURGERY     Cataract surgery   eyelid surgery     LEFT HEART CATH AND CORONARY ANGIOGRAPHY N/A 11/12/2017   Procedure: LEFT HEART CATH AND CORONARY ANGIOGRAPHY;  Surgeon: Marykay Lex, MD;  Location: [facility] INVASIVE CV LAB: 70% P-M LAD (FFR 0.76).  40% mid LAD, 50% ostial ramus.  EF 55-60%.  Mildly elevated LVEDP.   SHOULDER OPEN ROTATOR CUFF REPAIR  1983   TRANSTHORACIC ECHOCARDIOGRAM  08/2017   EF 55-60%.  Mild aortic stenosis (mean gradient 11 mmH.)  GR 1 DD.  Mild LA dilation.    Social History   Socioeconomic History   Marital status: Married    Spouse name: Bonita Quin   Number of children: 4   Years of education: 20   Highest education level: Not on file  Occupational History   Occupation: bus  Programme researcher, broadcasting/film/video: [employer]  Tobacco Use   Smoking status: Former    Packs/day: 1.00    Years: 15.00    Additional pack years: 0.00    Total pack years: 15.00    Types: Cigarettes    Quit date: 07/09/1982    Years since quitting: 40.6   Smokeless tobacco: Never  Substance and Sexual Activity   Alcohol use: No   Drug use: No   Sexual activity: Yes    Partners: Female  Other Topics Concern   Not on file  Social History Narrative   Marital status: married x 46 years; happily married.   Lives: with wife       Children:  4 children; 4 grandchildren. Adult children all live locally.  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      Tobacco:  In past; smoked x 15 years; quit 35 years ago.      Alcohol: none      ADLs: independent with all ADLs.  Does not use assistant device with ambulation.      Living Will:  Has living will; desires FULL CODE; no prolonged measures.      Doctoral degree in Biblical Studies.          Diet: eats twice daily, eggs,sausage,and fruits for breakfast; chicken (fried or broil), occassional pizza, salads, steak and pork chops.        Exercise: cardiac rehab, transition to [facility] - 45 minutes walks and resistance 5x/week   Social Determinants of Health   Financial Resource Strain: Low Risk  (06/24/2022)   Overall Financial Resource Strain (CARDIA)    Difficulty of Paying Living Expenses: Not hard at all  Food Insecurity: No Food Insecurity (06/24/2022)   Hunger Vital Sign    Worried About Running Out of Food in the Last Year: Never true    Ran Out of Food in the Last Year: Never true  Transportation Needs: No Transportation Needs (06/24/2022)   PRAPARE - Administrator, Civil Service (Medical): No    Lack of Transportation (Non-Medical): No  Physical Activity: Sufficiently Active (06/24/2022)   Exercise Vital Sign    Days of Exercise per  Week: 5 days    Minutes of Exercise per Session: 60 min  Stress: No Stress Concern Present (06/24/2022)   [employer] of Occupational Health - Occupational Stress Questionnaire    Feeling of Stress : Not at all  Social Connections: Socially Integrated (06/24/2022)   Social Connection and Isolation Panel [NHANES]    Frequency of Communication with Friends and Family: More than three times a week    Frequency of Social Gatherings with Friends and Family: More than three times a week    Attends Religious Services: More than 4 times per year    Active Member of [employer] or Organizations: Yes    Attends Engineer, structural: More than 4 times per year    Marital Status: Married  Catering manager Violence: Not At Risk (06/24/2022)   Humiliation, Afraid, Rape, and Kick questionnaire    Fear of Current or Ex-Partner: No    Emotionally Abused: No    Physically Abused: No    Sexually Abused: No    Family History  Problem Relation Age of Onset   Leukemia Mother 18       d. 107   Stroke Father 55       as a complication of CABG   Colon cancer Father 67   Coronary artery disease Father 77       - Sx was DOE - MV CAD   Peripheral Artery Disease Father        presumptive   Diabetes Sister    Sudden Cardiac Death Sister 66       presumed MI   Heart attack Brother 14       During throat surgery   Congestive Heart Failure Brother        Died @ 8 - ? CHF / ICM   Breast cancer Maternal Grandmother        dx in her 41s   Stroke Maternal Grandfather    Diabetes Brother 75       on lots of meds  Diabetes Brother    Diabetes Paternal Grandmother    Rheumatic fever Brother        childhood RF --> Valvular Dz - valve Sgx,  died young   Stomach cancer Neg Hx    Esophageal cancer Neg Hx      Review of Systems  Constitutional: Negative.  Negative for chills and fever.  HENT: Negative.  Negative for congestion and sore throat.   Respiratory: Negative.  Negative for cough and  shortness of breath.   Cardiovascular: Negative.  Negative for chest pain and palpitations.  Gastrointestinal:  Negative for abdominal pain, diarrhea, nausea and vomiting.  Genitourinary: Negative.  Negative for dysuria and hematuria.  Skin: Negative.  Negative for rash.  Neurological: Negative.  Negative for dizziness and headaches.  All other systems reviewed and are negative.   Vitals:   02/24/23 0826  BP: 130/82  Pulse: 65  Temp: 98 F (36.7 C)  SpO2: 96%    Physical Exam Vitals reviewed.  Constitutional:      Appearance: Normal appearance.  HENT:     Head: Normocephalic.     Mouth/Throat:     Mouth: Mucous membranes are moist.     Pharynx: Oropharynx is clear.  Eyes:     Extraocular Movements: Extraocular movements intact.     Pupils: Pupils are equal, round, and reactive to light.  Cardiovascular:     Rate and Rhythm: Normal rate and regular rhythm.     Heart sounds: Murmur (Systolic 3/6) heard.  Pulmonary:     Effort: Pulmonary effort is normal.     Breath sounds: Normal breath sounds.  Skin:    General: Skin is warm and dry.     Capillary Refill: Capillary refill takes less than 2 seconds.  Neurological:     General: No focal deficit present.     Mental Status: He is alert and oriented to person, place, and time.  Psychiatric:        Mood and Affect: Mood normal.        Behavior: Behavior normal.    Results for orders placed or performed in visit on 02/24/23 (from the past 24 hour(s))  POCT HgB A1C     Status: Abnormal   Collection Time: 02/24/23  9:44 AM  Result Value Ref Range   Hemoglobin A1C 7.4 (A) 4.0 - 5.6 %   HbA1c POC (<> result, manual entry)     HbA1c, POC (prediabetic range)     HbA1c, POC (controlled diabetic range)       ASSESSMENT & PLAN: A total of 42 minutes was spent with the patient and counseling/coordination of care regarding preparing for this visit, review of most recent office visit notes, review of multiple chronic medical  conditions under management, review of all medications and changes made, review of most recent blood work results including interpretation of today's hemoglobin A1c, education on nutrition, prognosis, documentation, and need for follow-up.  Problem List Items Addressed This Visit       Cardiovascular and Mediastinum   Hypertension associated with diabetes (HCC) - Primary (Chronic)    Well-controlled hypertension Continue losartan 50 mg daily Continue meds prolonged succinate 25 mg daily Cardiovascular risks associated with hypertension discussed Hemoglobin A1c higher than before at 7.3, not at goal Continue glipizide 5 mg twice a day Once to start GLP-1 agonist.  Recommend weekly Ozempic 0.5 mg Diet and nutrition discussed Hypoglycemia precautions discussed Plan is to decrease the glipizide dosage and eventually stopping it Has tried [place] before  but did not like side effects Intolerant to metformin      Relevant Medications   Semaglutide,0.25 or 0.5MG /DOS, 2 MG/3ML SOPN   ezetimibe (ZETIA) 10 MG tablet   glipiZIDE (GLUCOTROL) 5 MG tablet   Other Relevant Orders   POCT HgB A1C (Completed)   Coronary artery disease involving native coronary artery of native heart with angina pectoris (HCC)    Stable.  No recent anginal episodes. Normal recent nuclear stress test Continues metoprolol succinate 25 mg History of stent placement in the past Continues plan awake 75 mg daily      Relevant Medications   ezetimibe (ZETIA) 10 MG tablet   Atherosclerosis of aorta (HCC)    Intolerant to statins Continue Zetia 10 mg Was given 75-month injection for cholesterol control but having musculoskeletal side effects      Relevant Medications   ezetimibe (ZETIA) 10 MG tablet     Endocrine   Type 2 diabetes mellitus with hyperglycemia, without long-term current use of insulin (HCC) (Chronic)   Relevant Medications   Semaglutide,0.25 or 0.5MG /DOS, 2 MG/3ML SOPN   glipiZIDE (GLUCOTROL) 5  MG tablet   Hyperlipidemia associated with type 2 diabetes mellitus (HCC) (Chronic)   Relevant Medications   Semaglutide,0.25 or 0.5MG /DOS, 2 MG/3ML SOPN   ezetimibe (ZETIA) 10 MG tablet   glipiZIDE (GLUCOTROL) 5 MG tablet     Musculoskeletal and Integument   Statin myopathy (Chronic)   Other Visit Diagnoses     Type 2 diabetes mellitus with other specified complication (HCC)       Relevant Medications   Semaglutide,0.25 or 0.5MG /DOS, 2 MG/3ML SOPN   ezetimibe (ZETIA) 10 MG tablet   glipiZIDE (GLUCOTROL) 5 MG tablet      Patient Instructions  Health Maintenance After Age 39 After age 24, you are at a higher risk for certain long-term diseases and infections as well as injuries from falls. Falls are a major cause of broken bones and head injuries in people who are older than age 52. Getting regular preventive care can help to keep you healthy and well. Preventive care includes getting regular testing and making lifestyle changes as recommended by your health care provider. Talk with your health care provider about: Which screenings and tests you should have. A screening is a test that checks for a disease when you have no symptoms. A diet and exercise plan that is right for you. What should I know about screenings and tests to prevent falls? Screening and testing are the best ways to find a health problem early. Early diagnosis and treatment give you the best chance of managing medical conditions that are common after age 76. Certain conditions and lifestyle choices may make you more likely to have a fall. Your health care provider may recommend: Regular vision checks. Poor vision and conditions such as cataracts can make you more likely to have a fall. If you wear glasses, make sure to get your prescription updated if your vision changes. Medicine review. Work with your health care provider to regularly review all of the medicines you are taking, including over-the-counter medicines. Ask  your health care provider about any side effects that may make you more likely to have a fall. Tell your health care provider if any medicines that you take make you feel dizzy or sleepy. Strength and balance checks. Your health care provider may recommend certain tests to check your strength and balance while standing, walking, or changing positions. Foot health exam. Foot pain and numbness, as well  as not wearing proper footwear, can make you more likely to have a fall. Screenings, including: Osteoporosis screening. Osteoporosis is a condition that causes the bones to get weaker and break more easily. Blood pressure screening. Blood pressure changes and medicines to control blood pressure can make you feel dizzy. Depression screening. You may be more likely to have a fall if you have a fear of falling, feel depressed, or feel unable to do activities that you used to do. Alcohol use screening. Using too much alcohol can affect your balance and may make you more likely to have a fall. Follow these instructions at home: Lifestyle Do not drink alcohol if: Your health care provider tells you not to drink. If you drink alcohol: Limit how much you have to: 0-1 drink a day for women. 0-2 drinks a day for men. Know how much alcohol is in your drink. In the [place] one drink equals one 12 oz bottle of beer (355 mL), one 5 oz glass of wine (148 mL), or one 1 oz glass of hard liquor (44 mL). Do not use any products that contain nicotine or tobacco. These products include cigarettes, chewing tobacco, and vaping devices, such as e-cigarettes. If you need help quitting, ask your health care provider. Activity  Follow a regular exercise program to stay fit. This will help you maintain your balance. Ask your health care provider what types of exercise are appropriate for you. If you need a cane or walker, use it as recommended by your health care provider. Wear supportive shoes that have nonskid  soles. Safety  Remove any tripping hazards, such as rugs, cords, and clutter. Install safety equipment such as grab bars in bathrooms and safety rails on stairs. Keep rooms and walkways well-lit. General instructions Talk with your health care provider about your risks for falling. Tell your health care provider if: You fall. Be sure to tell your health care provider about all falls, even ones that seem minor. You feel dizzy, tiredness (fatigue), or off-balance. Take over-the-counter and prescription medicines only as told by your health care provider. These include supplements. Eat a healthy diet and maintain a healthy weight. A healthy diet includes low-fat dairy products, low-fat (lean) meats, and fiber from whole grains, beans, and lots of fruits and vegetables. Stay current with your vaccines. Schedule regular health, dental, and eye exams. Summary Having a healthy lifestyle and getting preventive care can help to protect your health and wellness after age 49. Screening and testing are the best way to find a health problem early and help you avoid having a fall. Early diagnosis and treatment give you the best chance for managing medical conditions that are more common for people who are older than age 23. Falls are a major cause of broken bones and head injuries in people who are older than age 47. Take precautions to prevent a fall at home. Work with your health care provider to learn what changes you can make to improve your health and wellness and to prevent falls. This information is not intended to replace advice given to you by your health care provider. Make sure you discuss any questions you have with your health care provider. Document Revised: 01/29/2021 Document Reviewed: 01/29/2021 Elsevier Patient Education  2024 Elsevier Inc.    Edwina Barth, MD [facility]

## 2023-02-24 NOTE — Assessment & Plan Note (Signed)
Well-controlled hypertension Continue losartan 50 mg daily Continue meds prolonged succinate 25 mg daily Cardiovascular risks associated with hypertension discussed Hemoglobin A1c higher than before at 7.3, not at goal Continue glipizide 5 mg twice a day Once to start GLP-1 agonist.  Recommend weekly Ozempic 0.5 mg Diet and nutrition discussed Hypoglycemia precautions discussed Plan is to decrease the glipizide dosage and eventually stopping it Has tried [place] before but did not like side effects Intolerant to metformin

## 2023-02-24 NOTE — Assessment & Plan Note (Signed)
Stable.  No recent anginal episodes. Normal recent nuclear stress test Continues metoprolol succinate 25 mg History of stent placement in the past Continues plan awake 75 mg daily

## 2023-02-25 ENCOUNTER — Ambulatory Visit (INDEPENDENT_AMBULATORY_CARE_PROVIDER_SITE_OTHER): Payer: Medicare Other | Admitting: Sports Medicine

## 2023-02-25 ENCOUNTER — Ambulatory Visit (INDEPENDENT_AMBULATORY_CARE_PROVIDER_SITE_OTHER): Payer: Medicare Other

## 2023-02-25 VITALS — BP 130/82 | HR 64 | Ht 69.0 in | Wt 208.0 lb

## 2023-02-25 DIAGNOSIS — M25552 Pain in left hip: Secondary | ICD-10-CM

## 2023-02-25 DIAGNOSIS — G8929 Other chronic pain: Secondary | ICD-10-CM | POA: Diagnosis not present

## 2023-02-25 DIAGNOSIS — M25562 Pain in left knee: Secondary | ICD-10-CM

## 2023-02-25 NOTE — Patient Instructions (Signed)
Tylenol [phone] mg 2-3 times a day for pain relief  Voltaren gel over areas of pain  Hip HEP  Call us if you want to start PT  4-6 week follow up

## 2023-02-27 ENCOUNTER — Ambulatory Visit: Payer: Medicare Other | Admitting: Emergency Medicine

## 2023-02-27 ENCOUNTER — Other Ambulatory Visit: Payer: Self-pay

## 2023-03-21 ENCOUNTER — Other Ambulatory Visit: Payer: Self-pay | Admitting: Emergency Medicine

## 2023-03-21 DIAGNOSIS — E1159 Type 2 diabetes mellitus with other circulatory complications: Secondary | ICD-10-CM

## 2023-04-02 DIAGNOSIS — R5383 Other fatigue: Secondary | ICD-10-CM | POA: Diagnosis not present

## 2023-04-03 ENCOUNTER — Ambulatory Visit: Payer: Medicare Other | Admitting: Sports Medicine

## 2023-04-03 DIAGNOSIS — R5381 Other malaise: Secondary | ICD-10-CM | POA: Diagnosis not present

## 2023-04-03 DIAGNOSIS — R5382 Chronic fatigue, unspecified: Secondary | ICD-10-CM | POA: Diagnosis not present

## 2023-04-03 DIAGNOSIS — Z13220 Encounter for screening for lipoid disorders: Secondary | ICD-10-CM | POA: Diagnosis not present

## 2023-04-08 ENCOUNTER — Ambulatory Visit (HOSPITAL_BASED_OUTPATIENT_CLINIC_OR_DEPARTMENT_OTHER): Payer: Medicare Other | Admitting: Internal Medicine

## 2023-04-14 ENCOUNTER — Telehealth: Payer: Self-pay | Admitting: Emergency Medicine

## 2023-04-14 DIAGNOSIS — I2510 Atherosclerotic heart disease of native coronary artery without angina pectoris: Secondary | ICD-10-CM

## 2023-04-14 DIAGNOSIS — I25119 Atherosclerotic heart disease of native coronary artery with unspecified angina pectoris: Secondary | ICD-10-CM

## 2023-04-14 NOTE — Telephone Encounter (Signed)
Okay to order it.  He also sees a cardiologist.

## 2023-04-14 NOTE — Telephone Encounter (Signed)
Pt would like a call back concerning about lab results. Please advise.

## 2023-04-15 ENCOUNTER — Other Ambulatory Visit: Payer: Self-pay | Admitting: *Deleted

## 2023-04-15 DIAGNOSIS — R5383 Other fatigue: Secondary | ICD-10-CM | POA: Diagnosis not present

## 2023-04-15 NOTE — Telephone Encounter (Signed)
Cardiovascular risk assessment

## 2023-04-15 NOTE — Telephone Encounter (Signed)
Please advise in reason for exam

## 2023-04-24 ENCOUNTER — Ambulatory Visit: Payer: Medicare Other

## 2023-04-29 ENCOUNTER — Ambulatory Visit (HOSPITAL_COMMUNITY)
Admission: RE | Admit: 2023-04-29 | Discharge: 2023-04-29 | Disposition: A | Discharge status: Home / Self Care | Payer: Medicare Other | Source: Ambulatory Visit | Attending: Emergency Medicine | Admitting: Emergency Medicine

## 2023-04-29 DIAGNOSIS — I25119 Atherosclerotic heart disease of native coronary artery with unspecified angina pectoris: Secondary | ICD-10-CM | POA: Insufficient documentation

## 2023-04-29 NOTE — Telephone Encounter (Signed)
Waiting on patient reply. Message was read by patient, as noted below  Last read by Sharlyn Bologna. at  6:02 PM on 04/04/2023.

## 2023-05-09 ENCOUNTER — Telehealth: Payer: Self-pay | Admitting: Emergency Medicine

## 2023-05-09 NOTE — Telephone Encounter (Signed)
Patient called and said he has been having diarrhea and other stomach issues since starting Semaglutide,0.25 or 0.5MG /DOS, 2 MG/3ML SOPN. He would like to know what he should do.  Patient would like a call back at [phone].

## 2023-05-09 NOTE — Telephone Encounter (Signed)
MD out of the office sending to DOD.Marland KitchenRaechel Chute

## 2023-05-09 NOTE — Telephone Encounter (Signed)
Called pt inform him of Vickie Henson,NP response. He states he was in the bathroom about 3 hours.. Its on and off. Pt states he up to 0.5mg /ml

## 2023-06-06 ENCOUNTER — Telehealth: Payer: Self-pay | Admitting: Pharmacy Technician

## 2023-06-06 NOTE — Telephone Encounter (Signed)
Auth Submission: APPROVED [place] RENEW[place] Site of care: Site of care: [facility] Payer: [employer] MEDICARE Medication & CPT/J Code(s) submitted: Leqvio (Inclisiran) [phone] Route of submission (phone, fax, portal): PORTAL Phone # Fax # Auth type: Buy/Bill PB Units/visits requested: X2 Reference number: [id number] Approval from: 05/23/23 to 05/22/24

## 2023-06-10 DIAGNOSIS — R5383 Other fatigue: Secondary | ICD-10-CM | POA: Diagnosis not present

## 2023-06-24 ENCOUNTER — Other Ambulatory Visit: Payer: Self-pay | Admitting: Emergency Medicine

## 2023-06-24 ENCOUNTER — Encounter: Payer: Self-pay | Admitting: Emergency Medicine

## 2023-06-24 ENCOUNTER — Ambulatory Visit (INDEPENDENT_AMBULATORY_CARE_PROVIDER_SITE_OTHER): Payer: Medicare Other | Admitting: Emergency Medicine

## 2023-06-24 VITALS — BP 140/72 | HR 61 | Temp 97.7°F | Ht 69.0 in | Wt 210.5 lb

## 2023-06-24 DIAGNOSIS — R399 Unspecified symptoms and signs involving the genitourinary system: Secondary | ICD-10-CM | POA: Diagnosis not present

## 2023-06-24 DIAGNOSIS — E1159 Type 2 diabetes mellitus with other circulatory complications: Secondary | ICD-10-CM

## 2023-06-24 DIAGNOSIS — E785 Hyperlipidemia, unspecified: Secondary | ICD-10-CM

## 2023-06-24 DIAGNOSIS — I152 Hypertension secondary to endocrine disorders: Secondary | ICD-10-CM

## 2023-06-24 DIAGNOSIS — I700 Atherosclerosis of aorta: Secondary | ICD-10-CM | POA: Diagnosis not present

## 2023-06-24 DIAGNOSIS — M25551 Pain in right hip: Secondary | ICD-10-CM | POA: Diagnosis not present

## 2023-06-24 DIAGNOSIS — Z7984 Long term (current) use of oral hypoglycemic drugs: Secondary | ICD-10-CM

## 2023-06-24 DIAGNOSIS — Z23 Encounter for immunization: Secondary | ICD-10-CM

## 2023-06-24 DIAGNOSIS — I25119 Atherosclerotic heart disease of native coronary artery with unspecified angina pectoris: Secondary | ICD-10-CM | POA: Diagnosis not present

## 2023-06-24 DIAGNOSIS — E1169 Type 2 diabetes mellitus with other specified complication: Secondary | ICD-10-CM | POA: Diagnosis not present

## 2023-06-24 DIAGNOSIS — M25552 Pain in left hip: Secondary | ICD-10-CM | POA: Diagnosis not present

## 2023-06-24 LAB — POCT GLYCOSYLATED HEMOGLOBIN (HGB A1C): Hemoglobin A1C: 6.6 % — AB (ref 4.0–5.6)

## 2023-06-24 LAB — LIPID PANEL
Cholesterol: 136 mg/dL (ref 0–200)
HDL: 46.1 mg/dL (ref >39.00)
LDL Cholesterol: 64 mg/dL (ref 0–99)
NonHDL: 89.64
Total CHOL/HDL Ratio: 3
Triglycerides: 127 mg/dL (ref 0.0–149.0)
VLDL: 25.4 mg/dL (ref 0.0–40.0)

## 2023-06-24 LAB — COMPREHENSIVE METABOLIC PANEL
ALT: 24 U/L (ref 0–53)
AST: 23 U/L (ref 0–37)
Albumin: 4.6 g/dL (ref 3.5–5.2)
Alkaline Phosphatase: 59 U/L (ref 39–117)
BUN: 20 mg/dL (ref 6–23)
CO2: 27 meq/L (ref 19–32)
Calcium: 10.3 mg/dL (ref 8.4–10.5)
Chloride: 104 meq/L (ref 96–112)
Creatinine, Ser: 0.85 mg/dL (ref 0.40–1.50)
GFR: 85.04 mL/min (ref >60.00)
Glucose, Bld: 121 mg/dL — ABNORMAL HIGH (ref 70–99)
Potassium: 4.4 meq/L (ref 3.5–5.1)
Sodium: 139 meq/L (ref 135–145)
Total Bilirubin: 0.6 mg/dL (ref 0.2–1.2)
Total Protein: 7.2 g/dL (ref 6.0–8.3)

## 2023-06-24 LAB — CBC WITH DIFFERENTIAL/PLATELET
Basophils Absolute: 0.1 10*3/uL (ref 0.0–0.1)
Basophils Relative: 1 % (ref 0.0–3.0)
Eosinophils Absolute: 0.2 10*3/uL (ref 0.0–0.7)
Eosinophils Relative: 2.3 % (ref 0.0–5.0)
HCT: 40.9 % (ref 39.0–52.0)
Hemoglobin: 13.5 g/dL (ref 13.0–17.0)
Lymphocytes Relative: 27 % (ref 12.0–46.0)
Lymphs Abs: 2.3 10*3/uL (ref 0.7–4.0)
MCHC: 33 g/dL (ref 30.0–36.0)
MCV: 92.8 fl (ref 78.0–100.0)
Monocytes Absolute: 0.5 10*3/uL (ref 0.1–1.0)
Monocytes Relative: 6.4 % (ref 3.0–12.0)
Neutro Abs: 5.4 10*3/uL (ref 1.4–7.7)
Neutrophils Relative %: 63.3 % (ref 43.0–77.0)
Platelets: 224 10*3/uL (ref 150.0–400.0)
RBC: 4.4 Mil/uL (ref 4.22–5.81)
RDW: 13.7 % (ref 11.5–15.5)
WBC: 8.5 10*3/uL (ref 4.0–10.5)

## 2023-06-24 LAB — PSA: PSA: 0.87 ng/mL (ref 0.10–4.00)

## 2023-06-24 MED ORDER — TADALAFIL 10 MG PO TABS: 10.0000 mg | ORAL_TABLET | Freq: Every day | ORAL | 3 refills | Status: DC

## 2023-06-24 NOTE — Patient Instructions (Signed)
Health Maintenance After Age 75 After age 75, you are at a higher risk for certain long-term diseases and infections as well as injuries from falls. Falls are a major cause of broken bones and head injuries in people who are older than age 75. Getting regular preventive care can help to keep you healthy and well. Preventive care includes getting regular testing and making lifestyle changes as recommended by your health care provider. Talk with your health care provider about: Which screenings and tests you should have. A screening is a test that checks for a disease when you have no symptoms. A diet and exercise plan that is right for you. What should I know about screenings and tests to prevent falls? Screening and testing are the best ways to find a health problem early. Early diagnosis and treatment give you the best chance of managing medical conditions that are common after age 75. Certain conditions and lifestyle choices may make you more likely to have a fall. Your health care provider may recommend: Regular vision checks. Poor vision and conditions such as cataracts can make you more likely to have a fall. If you wear glasses, make sure to get your prescription updated if your vision changes. Medicine review. Work with your health care provider to regularly review all of the medicines you are taking, including over-the-counter medicines. Ask your health care provider about any side effects that may make you more likely to have a fall. Tell your health care provider if any medicines that you take make you feel dizzy or sleepy. Strength and balance checks. Your health care provider may recommend certain tests to check your strength and balance while standing, walking, or changing positions. Foot health exam. Foot pain and numbness, as well as not wearing proper footwear, can make you more likely to have a fall. Screenings, including: Osteoporosis screening. Osteoporosis is a condition that causes  the bones to get weaker and break more easily. Blood pressure screening. Blood pressure changes and medicines to control blood pressure can make you feel dizzy. Depression screening. You may be more likely to have a fall if you have a fear of falling, feel depressed, or feel unable to do activities that you used to do. Alcohol use screening. Using too much alcohol can affect your balance and may make you more likely to have a fall. Follow these instructions at home: Lifestyle Do not drink alcohol if: Your health care provider tells you not to drink. If you drink alcohol: Limit how much you have to: 0-1 drink a day for women. 0-2 drinks a day for men. Know how much alcohol is in your drink. In the [place] one drink equals one 12 oz bottle of beer (355 mL), one 5 oz glass of wine (148 mL), or one 1 oz glass of hard liquor (44 mL). Do not use any products that contain nicotine or tobacco. These products include cigarettes, chewing tobacco, and vaping devices, such as e-cigarettes. If you need help quitting, ask your health care provider. Activity  Follow a regular exercise program to stay fit. This will help you maintain your balance. Ask your health care provider what types of exercise are appropriate for you. If you need a cane or walker, use it as recommended by your health care provider. Wear supportive shoes that have nonskid soles. Safety  Remove any tripping hazards, such as rugs, cords, and clutter. Install safety equipment such as grab bars in bathrooms and safety rails on stairs. Keep rooms and walkways   well-lit. General instructions Talk with your health care provider about your risks for falling. Tell your health care provider if: You fall. Be sure to tell your health care provider about all falls, even ones that seem minor. You feel dizzy, tiredness (fatigue), or off-balance. Take over-the-counter and prescription medicines only as told by your health care provider. These include  supplements. Eat a healthy diet and maintain a healthy weight. A healthy diet includes low-fat dairy products, low-fat (lean) meats, and fiber from whole grains, beans, and lots of fruits and vegetables. Stay current with your vaccines. Schedule regular health, dental, and eye exams. Summary Having a healthy lifestyle and getting preventive care can help to protect your health and wellness after age 75. Screening and testing are the best way to find a health problem early and help you avoid having a fall. Early diagnosis and treatment give you the best chance for managing medical conditions that are more common for people who are older than age 75. Falls are a major cause of broken bones and head injuries in people who are older than age 75. Take precautions to prevent a fall at home. Work with your health care provider to learn what changes you can make to improve your health and wellness and to prevent falls. This information is not intended to replace advice given to you by your health care provider. Make sure you discuss any questions you have with your health care provider. Document Revised: 01/29/2021 Document Reviewed: 01/29/2021 Else[employer] Patient Education  2024 [employer]  

## 2023-06-24 NOTE — Assessment & Plan Note (Signed)
Diet and nutrition discussed Intolerant to statins Intolerant to Repatha Continues Zetia 10 mg daily

## 2023-06-24 NOTE — Assessment & Plan Note (Signed)
Nocturia 4-5 times per night Active and affecting quality of life May benefit from Cialis 10 mg daily

## 2023-06-24 NOTE — Progress Notes (Signed)
Gregory Mathews. 77 y.o.   Chief Complaint  Patient presents with   Hip Pain    Patient states he is having some hip off and on    Urinary Frequency    Patient states he is urinating more often at night    Diabetes   Hypertension    HISTORY OF PRESENT ILLNESS: This is a 77 y.o. male here for follow-up of diabetes and hypertension Also complaining of the following: 1.  Urinary frequency, mostly at night 2.  Intermittent bilateral hip pain No other complaints or medical concerns today. Lab Results  Component Value Date   HGBA1C 7.4 (A) 02/24/2023     HPI   Prior to Admission medications   Medication Sig Start Date End Date Taking? Authorizing Provider  blood glucose meter kit and supplies Use to test blood sugar daily. Dx: E11.9 10/11/15  Yes Ofilia Neas, PA-C  clopidogrel (PLAVIX) 75 MG tablet Take 1 tablet (75 mg total) by mouth daily. 11/27/22  Yes Joylene Grapes, NP  clotrimazole-betamethasone (LOTRISONE) cream APPLY 1 APPLICATION TOPICALLY DAILY 02/07/23  Yes Leata Dominy, Eilleen Kempf, MD  ezetimibe (ZETIA) 10 MG tablet Take 1 tablet (10 mg total) by mouth daily. 02/24/23  Yes Coulson Wehner, Eilleen Kempf, MD  glipiZIDE (GLUCOTROL) 5 MG tablet Take 1 tablet (5 mg total) by mouth 2 (two) times daily before a meal. 02/24/23 02/19/24 Yes Kionte Baumgardner, Eilleen Kempf, MD  Lancets MISC Use to test blood sugar daily. Dx code :E11.9 07/08/19  Yes Georgina Quint, MD  losartan (COZAAR) 50 MG tablet TAKE 1 TABLET BY MOUTH EVERY DAY 03/21/23  Yes Graclyn Lawther, Eilleen Kempf, MD  metoprolol succinate (TOPROL-XL) 25 MG 24 hr tablet Take 1 tablet (25 mg total) by mouth daily. 11/27/22  Yes Monge, Petra Kuba, NP  Multiple Vitamin (MULTIVITAMIN) tablet Take 1 tablet by mouth daily.   Yes [provider]  [facility] ULTRA test strip USE TO TEST BLOOD SUGAR ONCE DAILY 02/07/23  Yes Shakir Petrosino, Eilleen Kempf, MD  Semaglutide,0.25 or 0.5MG /DOS, 2 MG/3ML SOPN Inject 0.5 mg into the skin once a week. Patient not  taking: Reported on 06/24/2023 02/24/23   Georgina Quint, MD    Allergies  Allergen Reactions   Januvia [Sitagliptin] Other (See Comments)    Gi upset and kidney problems   Lisinopril Cough   Repatha [Evolocumab] Other (See Comments)    Myalgias    Statins Other (See Comments)    Severe leg pain & fatigue -- tried at least 4 (even low dose)    Patient Active Problem List   Diagnosis Date Noted   Left lumbar radiculopathy 12/31/2022   CAD (coronary artery disease) 06/11/2022   Atherosclerosis of aorta (HCC) 02/15/2021   Family history of colon cancer    Family history of malignant neoplasm of breast    Polyposis of colon    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   Clotting disorder (HCC) 05/17/2020   Skin cancer, basal cell 11/18/2019   Uncircumcised male 03/11/2018   Coronary artery disease involving native coronary artery of native heart with angina pectoris (HCC) 11/04/2017   Abnormal findings on diagnostic imaging of cardiovascular system 11/04/2017   Agatston coronary artery calcium score between 100 and 199 09/22/2017   Family history of early CAD 09/11/2017   Mild aortic stenosis by prior echocardiogram 09/11/2017   Metabolic syndrome 09/11/2017   Hypogonadism in male 08/14/2016   BMI 31.0-31.9,adult 03/13/2016   Hypertension associated with diabetes (HCC) 08/29/2015   Diverticulosis of colon without hemorrhage  08/01/2015   Statin intolerance 08/01/2015   Basal cell carcinoma of nose 08/23/2014   Seasonal allergies 01/26/2013   Type 2 diabetes mellitus with hyperglycemia, without long-term current use of insulin (HCC) 07/09/2012   Hyperlipidemia associated with type 2 diabetes mellitus (HCC) 07/09/2012   Erectile dysfunction 07/09/2012    Past Medical History:  Diagnosis Date   Allergy    Basal cell carcinoma 01/2013   Nasal and R forearm.  [facility]   CAD S/P percutaneous coronary angioplasty    2/19 PCI/DESx1 to mLAD, FFR 0.7 -Sierra DES 2.75 mm x 18 mm    Cataract    Diabetes mellitus without complication (HCC)    on PO Meds   Essential hypertension    Family history of colon cancer    Family history of malignant neoplasm of breast    Heart murmur    Hyperlipidemia    statin intolerant -- myalgias, fatigue (tried at least 4)   Polyposis of colon    Rosacea     Past Surgical History:  Procedure Laterality Date   basal cell removal     nose and wrist    BELPHAROPTOSIS REPAIR     CORONARY CT ANGIOGRAM  09/2017   Coronary Calcium Score: 111. Coronary calcification noted in the LEFT MAIN (LM) and prox LAD.  LM < 50% calcified stenosis.  Mid LAD 50-75% plaque noted CT FFR --> positive at 0.80.  Recommend CARDIAC CATHETERIZATION   CORONARY PRESSURE/FFR STUDY N/A 11/12/2017   Procedure: INTRAVASCULAR PRESSURE WIRE/FFR STUDY;  Surgeon: Marykay Lex, MD;  Location: [facility] INVASIVE CV LAB;  Service: Cardiovascular;  Laterality: LAD -FFR 0.76   CORONARY STENT INTERVENTION N/A 11/12/2017   Procedure: CORONARY STENT INTERVENTION;  Surgeon: Marykay Lex, MD;  Location: [facility] INVASIVE CV LAB;  Service: Cardiovascular:  DES PCI:  STENT SIERRA 2.75 X 18 MM - Post intervention, there is a 0% residual stenosis.   ETT/GXT: Graded Exercise Tolerance Test  08/2015    Exercised for 9:01 min --> blunted blood pressure response no EKG changes.  No chest pain.  Low Risk   EYE SURGERY     Cataract surgery   eyelid surgery     LEFT HEART CATH AND CORONARY ANGIOGRAPHY N/A 11/12/2017   Procedure: LEFT HEART CATH AND CORONARY ANGIOGRAPHY;  Surgeon: Marykay Lex, MD;  Location: [facility] INVASIVE CV LAB: 70% P-M LAD (FFR 0.76).  40% mid LAD, 50% ostial ramus.  EF 55-60%.  Mildly elevated LVEDP.   SHOULDER OPEN ROTATOR CUFF REPAIR  1983   TRANSTHORACIC ECHOCARDIOGRAM  08/2017   EF 55-60%.  Mild aortic stenosis (mean gradient 11 mmH.)  GR 1 DD.  Mild LA dilation.    Social History   Socioeconomic History   Marital status: Married    Spouse name: Bonita Quin   Number of  children: 4   Years of education: 20   Highest education level: Not on file  Occupational History   Occupation: bus Programme researcher, broadcasting/film/video: [employer]  Tobacco Use   Smoking status: Former    Current packs/day: 0.00    Average packs/day: 1 pack/day for 15.0 years (15.0 ttl pk-yrs)    Types: Cigarettes    Start date: 07/10/1967    Quit date: 07/09/1982    Years since quitting: 40.9   Smokeless tobacco: Never  Substance and Sexual Activity   Alcohol use: No   Drug use: No   Sexual activity: Yes    Partners: Female  Other  Topics Concern   Not on file  Social History Narrative   Marital status: married x 46 years; happily married.   Lives: with wife       Children:  4 children; 4 grandchildren. Adult children all live locally.          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      Tobacco:  In past; smoked x 15 years; quit 35 years ago.      Alcohol: none      ADLs: independent with all ADLs.  Does not use assistant device with ambulation.      Living Will:  Has living will; desires FULL CODE; no prolonged measures.      Doctoral degree in Biblical Studies.          Diet: eats twice daily, eggs,sausage,and fruits for breakfast; chicken (fried or broil), occassional pizza, salads, steak and pork chops.        Exercise: cardiac rehab, transition to [facility] - 45 minutes walks and resistance 5x/week   Social Determinants of Health   Financial Resource Strain: Low Risk  (06/24/2022)   Overall Financial Resource Strain (CARDIA)    Difficulty of Paying Living Expenses: Not hard at all  Food Insecurity: No Food Insecurity (06/24/2022)   Hunger Vital Sign    Worried About Running Out of Food in the Last Year: Never true    Ran Out of Food in the Last Year: Never true  Transportation Needs: No Transportation Needs (06/24/2022)   PRAPARE - Administrator, Civil Service (Medical): No     Lack of Transportation (Non-Medical): No  Physical Activity: Sufficiently Active (06/24/2022)   Exercise Vital Sign    Days of Exercise per Week: 5 days    Minutes of Exercise per Session: 60 min  Stress: No Stress Concern Present (06/24/2022)   [employer] of Occupational Health - Occupational Stress Questionnaire    Feeling of Stress : Not at all  Social Connections: Socially Integrated (06/24/2022)   Social Connection and Isolation Panel [NHANES]    Frequency of Communication with Friends and Family: More than three times a week    Frequency of Social Gatherings with Friends and Family: More than three times a week    Attends Religious Services: More than 4 times per year    Active Member of [employer] or Organizations: Yes    Attends Engineer, structural: More than 4 times per year    Marital Status: Married  Catering manager Violence: Not At Risk (06/24/2022)   Humiliation, Afraid, Rape, and Kick questionnaire    Fear of Current or Ex-Partner: No    Emotionally Abused: No    Physically Abused: No    Sexually Abused: No    Family History  Problem Relation Age of Onset   Leukemia Mother 65       d. 45   Stroke Father 73       as a complication of CABG   Colon cancer Father 67   Coronary artery disease Father 73       - Sx was DOE - MV CAD   Peripheral Artery Disease Father        presumptive   Diabetes Sister    Sudden Cardiac Death Sister 37       presumed MI   Heart attack Brother 22       During throat surgery   Congestive Heart Failure Brother  Died @ 15 - ? CHF / ICM   Breast cancer Maternal Grandmother        dx in her 17s   Stroke Maternal Grandfather    Diabetes Brother 83       on lots of meds   Diabetes Brother    Diabetes Paternal Grandmother    Rheumatic fever Brother        childhood RF --> Valvular Dz - valve Sgx,  died young   Stomach cancer Neg Hx    Esophageal cancer Neg Hx      Review of Systems  Constitutional: Negative.   Negative for chills and fever.  HENT: Negative.  Negative for congestion and sore throat.   Respiratory: Negative.  Negative for cough and shortness of breath.   Cardiovascular: Negative.  Negative for chest pain and palpitations.  Genitourinary:  Positive for frequency. Negative for dysuria and hematuria.       Nocturia  Musculoskeletal:  Positive for joint pain (Both hips).  Skin: Negative.  Negative for rash.  Neurological: Negative.  Negative for dizziness and headaches.  All other systems reviewed and are negative.   Vitals:   06/24/23 0935  BP: (!) 140/72  Pulse: 61  Temp: 97.7 F (36.5 C)  SpO2: 94%    Physical Exam Vitals reviewed.  Constitutional:      Appearance: Normal appearance.  HENT:     Head: Normocephalic.  Eyes:     Extraocular Movements: Extraocular movements intact.     Pupils: Pupils are equal, round, and reactive to light.  Cardiovascular:     Rate and Rhythm: Normal rate and regular rhythm.     Heart sounds: Murmur heard.  Pulmonary:     Effort: Pulmonary effort is normal.     Breath sounds: Normal breath sounds.  Musculoskeletal:     Cervical back: No tenderness.  Lymphadenopathy:     Cervical: No cervical adenopathy.  Skin:    General: Skin is warm and dry.     Capillary Refill: Capillary refill takes less than 2 seconds.  Neurological:     General: No focal deficit present.     Mental Status: He is alert and oriented to person, place, and time.  Psychiatric:        Mood and Affect: Mood normal.        Behavior: Behavior normal.     Results for orders placed or performed in visit on 06/24/23 (from the past 24 hour(s))  POCT HgB A1C     Status: Abnormal   Collection Time: 06/24/23  9:50 AM  Result Value Ref Range   Hemoglobin A1C 6.6 (A) 4.0 - 5.6 %   HbA1c POC (<> result, manual entry)     HbA1c, POC (prediabetic range)     HbA1c, POC (controlled diabetic range)      ASSESSMENT & PLAN: A total of 43 minutes was spent with the  patient and counseling/coordination of care regarding preparing for this visit, review of most recent office visit notes, review of multiple chronic medical conditions under management, review of most recent blood work results including interpretation of today's hemoglobin A1c, review of all medications, cardiovascular risks associated with hypertension and diabetes, management of lower urinary tract symptoms, review of health maintenance items, education and nutrition, prognosis, documentation, need for follow-up.  Problem List Items Addressed This Visit       Cardiovascular and Mediastinum   Hypertension associated with diabetes (HCC) - Primary (Chronic)    Well-controlled hypertension Continues losartan  50 mg and metoprolol succinate 25 mg daily Well-controlled diabetes with hemoglobin A1c of 6.6 Continues glipizide 5 mg twice a day Has developed lots of side effects to most other diabetic medications.  Mostly GI side effects. Cardiovascular risks associated with hypertension and diabetes discussed Diet and nutrition discussed      Relevant Medications   tadalafil (CIALIS) 10 MG tablet   Other Relevant Orders   POCT HgB A1C (Completed)   Comprehensive metabolic panel   Lipid panel   CBC with Differential/Platelet   Coronary artery disease involving native coronary artery of native heart with angina pectoris (HCC)    Stable.  No recent anginal episodes Exercises regularly Continues daily Plavix 75 mg      Relevant Medications   tadalafil (CIALIS) 10 MG tablet   Atherosclerosis of aorta (HCC)    Diet and nutrition discussed Intolerant to statins Intolerant to Repatha Continues Zetia 10 mg daily      Relevant Medications   tadalafil (CIALIS) 10 MG tablet     Endocrine   Hyperlipidemia associated with type 2 diabetes mellitus (HCC) (Chronic)    Well-controlled diabetes with hemoglobin A1c of 6.6 Continues glipizide 5 mg twice a day and Zetia 10 mg daily Diet and nutrition  discussed      Relevant Medications   tadalafil (CIALIS) 10 MG tablet     Other   Lower urinary tract symptoms    Nocturia 4-5 times per night Active and affecting quality of life May benefit from Cialis 10 mg daily      Relevant Medications   tadalafil (CIALIS) 10 MG tablet   Other Relevant Orders   PSA   Bilateral hip pain    Improved with exercise Pain management discussed Tylenol okay Advised to avoid NSAIDs      Other Visit Diagnoses     Need for vaccination       Relevant Orders   Flu Vaccine Trivalent High Dose (Fluad)      Patient Instructions  Health Maintenance After Age 79 After age 25, you are at a higher risk for certain long-term diseases and infections as well as injuries from falls. Falls are a major cause of broken bones and head injuries in people who are older than age 37. Getting regular preventive care can help to keep you healthy and well. Preventive care includes getting regular testing and making lifestyle changes as recommended by your health care provider. Talk with your health care provider about: Which screenings and tests you should have. A screening is a test that checks for a disease when you have no symptoms. A diet and exercise plan that is right for you. What should I know about screenings and tests to prevent falls? Screening and testing are the best ways to find a health problem early. Early diagnosis and treatment give you the best chance of managing medical conditions that are common after age 110. Certain conditions and lifestyle choices may make you more likely to have a fall. Your health care provider may recommend: Regular vision checks. Poor vision and conditions such as cataracts can make you more likely to have a fall. If you wear glasses, make sure to get your prescription updated if your vision changes. Medicine review. Work with your health care provider to regularly review all of the medicines you are taking, including  over-the-counter medicines. Ask your health care provider about any side effects that may make you more likely to have a fall. Tell your health care provider if  any medicines that you take make you feel dizzy or sleepy. Strength and balance checks. Your health care provider may recommend certain tests to check your strength and balance while standing, walking, or changing positions. Foot health exam. Foot pain and numbness, as well as not wearing proper footwear, can make you more likely to have a fall. Screenings, including: Osteoporosis screening. Osteoporosis is a condition that causes the bones to get weaker and break more easily. Blood pressure screening. Blood pressure changes and medicines to control blood pressure can make you feel dizzy. Depression screening. You may be more likely to have a fall if you have a fear of falling, feel depressed, or feel unable to do activities that you used to do. Alcohol use screening. Using too much alcohol can affect your balance and may make you more likely to have a fall. Follow these instructions at home: Lifestyle Do not drink alcohol if: Your health care provider tells you not to drink. If you drink alcohol: Limit how much you have to: 0-1 drink a day for women. 0-2 drinks a day for men. Know how much alcohol is in your drink. In the [place] one drink equals one 12 oz bottle of beer (355 mL), one 5 oz glass of wine (148 mL), or one 1 oz glass of hard liquor (44 mL). Do not use any products that contain nicotine or tobacco. These products include cigarettes, chewing tobacco, and vaping devices, such as e-cigarettes. If you need help quitting, ask your health care provider. Activity  Follow a regular exercise program to stay fit. This will help you maintain your balance. Ask your health care provider what types of exercise are appropriate for you. If you need a cane or walker, use it as recommended by your health care provider. Wear supportive shoes  that have nonskid soles. Safety  Remove any tripping hazards, such as rugs, cords, and clutter. Install safety equipment such as grab bars in bathrooms and safety rails on stairs. Keep rooms and walkways well-lit. General instructions Talk with your health care provider about your risks for falling. Tell your health care provider if: You fall. Be sure to tell your health care provider about all falls, even ones that seem minor. You feel dizzy, tiredness (fatigue), or off-balance. Take over-the-counter and prescription medicines only as told by your health care provider. These include supplements. Eat a healthy diet and maintain a healthy weight. A healthy diet includes low-fat dairy products, low-fat (lean) meats, and fiber from whole grains, beans, and lots of fruits and vegetables. Stay current with your vaccines. Schedule regular health, dental, and eye exams. Summary Having a healthy lifestyle and getting preventive care can help to protect your health and wellness after age 35. Screening and testing are the best way to find a health problem early and help you avoid having a fall. Early diagnosis and treatment give you the best chance for managing medical conditions that are more common for people who are older than age 51. Falls are a major cause of broken bones and head injuries in people who are older than age 45. Take precautions to prevent a fall at home. Work with your health care provider to learn what changes you can make to improve your health and wellness and to prevent falls. This information is not intended to replace advice given to you by your health care provider. Make sure you discuss any questions you have with your health care provider. Document Revised: 01/29/2021 Document Reviewed: 01/29/2021 Elsevier Patient  Education  2024 Elsevier Inc.     Edwina Barth, MD [facility]

## 2023-06-24 NOTE — Telephone Encounter (Signed)
Vardenafil not an option. Needs to be Cialis 10 mg.  It is for daily dose to treat lower urinary tract symptoms secondary to BPH I guess it needs PA

## 2023-06-24 NOTE — Telephone Encounter (Signed)
Vardenafil not an option.  Cialis 10 mg daily for lower urinary tract symptoms secondary to BPH.

## 2023-06-24 NOTE — Assessment & Plan Note (Signed)
Improved with exercise Pain management discussed Tylenol okay Advised to avoid NSAIDs

## 2023-06-24 NOTE — Assessment & Plan Note (Signed)
Well-controlled diabetes with hemoglobin A1c of 6.6 Continues glipizide 5 mg twice a day and Zetia 10 mg daily Diet and nutrition discussed

## 2023-06-24 NOTE — Assessment & Plan Note (Signed)
Well-controlled hypertension Continues losartan 50 mg and metoprolol succinate 25 mg daily Well-controlled diabetes with hemoglobin A1c of 6.6 Continues glipizide 5 mg twice a day Has developed lots of side effects to most other diabetic medications.  Mostly GI side effects. Cardiovascular risks associated with hypertension and diabetes discussed Diet and nutrition discussed

## 2023-06-24 NOTE — Assessment & Plan Note (Signed)
Stable.  No recent anginal episodes Exercises regularly Continues daily Plavix 75 mg

## 2023-08-19 ENCOUNTER — Telehealth: Payer: Self-pay | Admitting: Cardiology

## 2023-08-19 NOTE — Telephone Encounter (Signed)
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

## 2023-08-19 NOTE — Telephone Encounter (Signed)
Gregory Mathews is a general cardiology patient of Dr. Herbie Baltimore  Sees Dr. Rennis Golden for lipid management   Message routed

## 2023-08-26 DIAGNOSIS — R5383 Other fatigue: Secondary | ICD-10-CM | POA: Diagnosis not present

## 2023-08-26 DIAGNOSIS — Z13220 Encounter for screening for lipoid disorders: Secondary | ICD-10-CM | POA: Diagnosis not present

## 2023-08-27 NOTE — Telephone Encounter (Signed)
Patient identification verified by 2 forms. 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 Herbie Baltimore   -follow up scheduled 1/3 with NP Monge  Patient verbalized understanding, no questions at this time

## 2023-08-27 NOTE — Telephone Encounter (Signed)
I have not seen this patient since 2021.  I would not bill for letting paperwork on him since have not seen him in at least a year.  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  He would need to be seen in clinic before we can comment on this.

## 2023-08-27 NOTE — Telephone Encounter (Signed)
Patient is calling back for update. Please advise  

## 2023-08-28 ENCOUNTER — Ambulatory Visit: Payer: Medicare Other | Admitting: Emergency Medicine

## 2023-08-28 NOTE — Telephone Encounter (Signed)
Patient identification verified by 2 forms. Marilynn Rail, RN    Called and spoke to patient  Relayed provider message  Informed patient OV needed  Patient scheduled 12/13 at 11:30am with NP [facility]  Patient verbalized understanding, no questions at this time

## 2023-09-03 NOTE — Progress Notes (Signed)
Cardiology Office Note    Date:  09/05/2023  ID:  Gregory Bologna., DOB 03/15/1946, MRN [id number] PCP:  Georgina Quint, MD  Cardiologist:  Bryan Lemma, MD  Electrophysiologist:  None   Chief Complaint: Follow up for CAD   History of Present Illness: .    Gregory Elon Swetland. is a 77 y.o. male with visit-pertinent history of CAD s/p DES to LAD in 2019, mild aortic stenosis, hypertension, hyperlipidemia and type 2 diabetes.  He presents today for follow-up related to CAD.  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  He has a history of statin intolerance, as well as history of intolerance to Praluent and Repatha.  At office visit in 09/2021 he was noted to have bilateral carotid bruits, carotid ultrasound showed no evidence of carotid artery stenosis.  He was last seen in clinic on 11/27/2022 for follow-up, since last visit he remained stable from a cardiac standpoint.  He denied any can symptoms concerning for angina.  He underwent echocardiogram and MPI for DOT clearance.  Echocardiogram on 12/30/2022 indicated LVEF of 60 to 65%, LV with normal function, no RWMA, grade 1 diastolic dysfunction, he had mild to moderate aortic valve stenosis.  His MPI on 12/31/2022 was normal and low risk noted to have good exercise capacity.  He was seen by Dr. Rennis Golden in the lipid clinic on 01/16/2023, patient had previously been approved to start on Leqvio but did not for concerns for side effects.  At visit with Dr. Rennis Golden he agreed to start on Leqvio.  Today he presents for follow-up.  He reports that he is doing very well.  He reports that he walks on a treadmill regularly and lifts weights for at least 30 minutes multiple times a week.  He denies any chest pain, shortness of breath, lower extremity edema, orthopnea or PND.  Labwork independently reviewed: 06/24/2023: Hemoglobin 13.5, hematocrit 40.9, sodium 139, potassium 4.4, creatinine 0.85, AST 23, ALT 24  ROS: .   Today he denies chest  pain, shortness of breath, lower extremity edema, fatigue, palpitations, melena, hematuria, hemoptysis, diaphoresis, weakness, presyncope, syncope, orthopnea, and PND.  All other systems are reviewed and otherwise negative. Studies Reviewed: Marland Kitchen    EKG:  EKG is ordered today, personally reviewed, demonstrating  EKG Interpretation Date/Time:  Friday September 05 2023 11:20:28 EST Ventricular Rate:  77 PR Interval:  204 QRS Duration:  82 QT Interval:  370 QTC Calculation: 418 R Axis:   -39  Text Interpretation: Normal sinus rhythm with sinus arrhythmia No significant change was found Confirmed by Reather Littler [phone]) on 09/05/2023 6:40:34 PM   CV Studies:  Cardiac Studies & Procedures   CARDIAC CATHETERIZATION  CARDIAC CATHETERIZATION 11/12/2017  Narrative Images from the original result were not included.   Prox LAD lesion is 70% stenosed. FFR 0.76: CULPRIT LESION  A drug-eluting stent was successfully placed using a STENT SIERRA 2.75 X 18 MM.  Post intervention, there is a 0% residual stenosis.  Mid LAD lesion is 40% stenosed well beyond the stent.  Ost Ramus lesion is 50% stenosed.  The left ventricular systolic function is normal. The left ventricular ejection fraction is 55-65% by visual estimate.  LV end diastolic pressure is mildly elevated.  As an expected from coronary CTA, FFR positive proximal LAD lesion (although it did not appear angiographically significant, was FFR positive) treated with a DES stent Normal LVEF with mildly elevated EDP.   Plan: Same day discharge after bed rest.  DAPT for minimum 6 months, can stop aspirin at 6 months.  We will try to start rosuvastatin at 1 tab every other day.  We will need to reassess in follow-up.  Discharge on low-dose beta-blocker  He will follow-up with me as scheduled.   Bryan Lemma, M.D., M.S. Interventional Cardiologist  Pager # [phone] Phone # [phone] [address] [place],  [place] [address]  Findings Coronary Findings Diagnostic  Dominance: Right  Left Main Vessel is large.  Left Anterior Descending Prox LAD lesion is 70% stenosed. The lesion is located proximal to the major branch, segmental and tubular. Pressure wire/FFR was performed on the lesion. FFR: 0.76. With the EBU 3.5 guide catheter already in place, Prowater wire was advanced beyond the lesion.  Micronavvus FFR catheter was then advanced beyond the lesion.  Baseline FFR was 0.92 Adenosine infused at 140 mcg/KG/min for 90 out of the intended 120 seconds.  It was stopped when the FFR went down to 0.76. -&gt;  With physiologically significant lesion, we proceeded with PCI. Mid LAD lesion is 40% stenosed. The lesion is located at the bend.  First Diagonal Branch Vessel is small in size.  Second Diagonal Branch Vessel is moderate in size.  Third Diagonal Branch Vessel is small in size.  Ramus Intermedius Vessel is small. Vessel is angiographically normal. Ost Ramus lesion is 50% stenosed.  Lateral Ramus Intermedius Vessel is angiographically normal.  Left Circumflex Vessel is large.  First Obtuse Marginal Branch Vessel is small in size. Vessel is angiographically normal.  Second Obtuse Marginal Branch Vessel is small in size.  Right Coronary Artery Vessel is large. Vessel is angiographically normal.  Acute Marginal Branch Vessel is small in size.  Right Posterior Descending Artery Vessel is moderate in size.  Inferior Septal Vessel is small in size.  Right Posterior Atrioventricular Artery Vessel is small in size.  Intervention  Prox LAD lesion Stent Lesion length:  16 mm. Lesion crossed with guidewire using a WIRE ASAHI PROWATER 180CM. Pre-stent angioplasty was performed using a BALLOON SAPPHIRE 2.5X15. Maximum pressure:  12 atm. Inflation time:  20 sec. A drug-eluting stent was successfully placed using a STENT SIERRA 2.75 X 18 MM. Maximum pressure: 20 atm. Inflation time: 30  sec. Minimum lumen area:  3.1 mm. Stent strut is well apposed. Post-stent angioplasty was performed using a BALLOON SAPPHIRE Vivian 3.0X10. Maximum pressure:  16 atm. Inflation time:  20 sec. Post-Intervention Lesion Assessment The intervention was successful. Pre-interventional TIMI flow is 3. Post-intervention TIMI flow is 3. Treated lesion length:  18 mm. No complications occurred at this lesion. There is a 0% residual stenosis post intervention.   STRESS TESTS  MYOCARDIAL PERFUSION IMAGING 12/31/2022  Narrative   The study is normal. The study is low risk.   Good exercise capacity, achieved 10.1 METS   Peak heart rate 133 bpm (91% max age predicted HR)   Hypertensive response to exercise, peak BP 215/75   No ST deviation was noted.   LV perfusion is normal. There is no evidence of ischemia. There is no evidence of infarction.   Left ventricular function is normal. End diastolic cavity size is normal. End systolic cavity size is normal.   Prior study available for comparison from 10/26/2021.  Fixed inferior perfusion defect with normal wall motion, consistent with artifact Low risk study  ECHOCARDIOGRAM  ECHOCARDIOGRAM COMPLETE 12/30/2022  Narrative ECHOCARDIOGRAM REPORT    Patient Name:   Gregory Mathews. Date of Exam: 12/30/2022 Medical Rec #:  [id number]          Height:       69.0 in Accession #:    [id number]         Weight:       211.0 lb Date of Birth:  01/08/1946          BSA:          2.114 m Patient Age:    76 years           BP:           128/72 mmHg Patient Gender: M                  HR:           65 bpm. Exam Location:  [address]  Procedure: 2D Echo, Cardiac Doppler and Color Doppler  Indications:    I35.0 AS  History:        Patient has prior history of Echocardiogram examinations, most recent 10/19/2019. AS; Risk Factors:Hypertension, Diabetes and Dyslipidemia.  Sonographer:    Samule Ohm RDCS Referring Phys: [phone] EMILY C MONGE  IMPRESSIONS   1.  Left ventricular ejection fraction, by estimation, is 60 to 65%. The left ventricle has normal function. The left ventricle has no regional wall motion abnormalities. Left ventricular diastolic parameters are consistent with Grade I diastolic dysfunction (impaired relaxation). The average left ventricular global longitudinal strain is -17.6 %. The global longitudinal strain is normal. 2. Right ventricular systolic function is normal. The right ventricular size is normal. There is normal pulmonary artery systolic pressure. The estimated right ventricular systolic pressure is 26.2 mmHg. 3. The mitral valve is normal in structure. No evidence of mitral valve regurgitation. No evidence of mitral stenosis. 4. Tricuspid valve regurgitation is moderate. 5. The aortic valve is tricuspid. There is severe calcifcation of the aortic valve. There is severe thickening of the aortic valve. Aortic valve regurgitation is not visualized. Mild to moderate aortic valve stenosis. Aortic valve mean gradient measures 22.0 mmHg. Aortic valve Vmax measures 2.97 m/s. 6. The inferior vena cava is normal in size with greater than 50% respiratory variability, suggesting right atrial pressure of 3 mmHg.  Comparison(s): Prior images reviewed side by side. ECHO 2021: Prior AV mean gradient 12 mmHg.  FINDINGS Left Ventricle: Left ventricular ejection fraction, by estimation, is 60 to 65%. The left ventricle has normal function. The left ventricle has no regional wall motion abnormalities. The average left ventricular global longitudinal strain is -17.6 %. The global longitudinal strain is normal. The left ventricular internal cavity size was normal in size. There is no left ventricular hypertrophy. Left ventricular diastolic parameters are consistent with Grade I diastolic dysfunction (impaired relaxation).  Right Ventricle: The right ventricular size is normal. No increase in right ventricular wall thickness. Right ventricular  systolic function is normal. There is normal pulmonary artery systolic pressure. The tricuspid regurgitant velocity is 2.41 m/s, and with an assumed right atrial pressure of 3 mmHg, the estimated right ventricular systolic pressure is 26.2 mmHg.  Left Atrium: Left atrial size was normal in size.  Right Atrium: Right atrial size was normal in size.  Pericardium: There is no evidence of pericardial effusion.  Mitral Valve: The mitral valve is normal in structure. No evidence of mitral valve regurgitation. No evidence of mitral valve stenosis.  Tricuspid Valve: The tricuspid valve is normal in structure. Tricuspid valve regurgitation is moderate . No evidence of tricuspid stenosis.  Aortic Valve: The aortic valve is tricuspid.  There is severe calcifcation of the aortic valve. There is severe thickening of the aortic valve. Aortic valve regurgitation is not visualized. Mild to moderate aortic stenosis is present. Aortic valve mean gradient measures 22.0 mmHg. Aortic valve peak gradient measures 35.3 mmHg. Aortic valve area, by VTI measures 1.06 cm.  Pulmonic Valve: The pulmonic valve was normal in structure. Pulmonic valve regurgitation is mild. No evidence of pulmonic stenosis.  Aorta: The aortic root is normal in size and structure.  Venous: The inferior vena cava is normal in size with greater than 50% respiratory variability, suggesting right atrial pressure of 3 mmHg.  IAS/Shunts: No atrial level shunt detected by color flow Doppler.   LEFT VENTRICLE PLAX 2D LVIDd:         4.30 cm   Diastology LVIDs:         2.80 cm   LV e' medial:    6.74 cm/s LV PW:         1.20 cm   LV E/e' medial:  13.7 LV IVS:        1.30 cm   LV e' lateral:   9.79 cm/s LVOT diam:     2.10 cm   LV E/e' lateral: 9.4 LV SV:         79 LV SV Index:   38        2D Longitudinal Strain LVOT Area:     3.46 cm  2D Strain GLS Avg:     -17.6 %   RIGHT VENTRICLE             IVC RV S prime:     12.40 cm/s  IVC diam:  1.20 cm TAPSE (M-mode): 2.3 cm RVSP:           26.2 mmHg  LEFT ATRIUM             Index        RIGHT ATRIUM           Index LA diam:        4.10 cm 1.94 cm/m   RA Pressure: 3.00 mmHg LA Vol (A2C):   67.3 ml 31.84 ml/m  RA Area:     13.40 cm LA Vol (A4C):   69.2 ml 32.74 ml/m  RA Volume:   30.60 ml  14.48 ml/m LA Biplane Vol: 69.4 ml 32.84 ml/m AORTIC VALVE AV Area (Vmax):    1.15 cm AV Area (Vmean):   1.03 cm AV Area (VTI):     1.06 cm AV Vmax:           297.00 cm/s AV Vmean:          222.000 cm/s AV VTI:            0.745 m AV Peak Grad:      35.3 mmHg AV Mean Grad:      22.0 mmHg LVOT Vmax:         98.20 cm/s LVOT Vmean:        65.700 cm/s LVOT VTI:          0.229 m LVOT/AV VTI ratio: 0.31  AORTA Ao Root diam: 3.80 cm Ao Asc diam:  3.40 cm  MITRAL VALVE                TRICUSPID VALVE MV Area (PHT): 2.33 cm     TR Peak grad:   23.2 mmHg MV Decel Time: 326 msec     TR Vmax:        241.00  cm/s MV E velocity: 92.20 cm/s   Estimated RAP:  3.00 mmHg MV A velocity: 114.00 cm/s  RVSP:           26.2 mmHg MV E/A ratio:  0.81 SHUNTS Systemic VTI:  0.23 m Systemic Diam: 2.10 cm  Donato Schultz MD Electronically signed by Donato Schultz MD Signature Date/Time: 12/30/2022/2:16:07 PM    Final    CT SCANS  CT CARDIAC SCORING (SELF PAY ONLY) 04/29/2023  Addendum 05/07/2023 10:34 AM ADDENDUM REPORT: 05/07/2023 10:32  EXAM: OVER-READ INTERPRETATION  CT CHEST  The following report is an over-read performed by radiologist Dr. Narda Rutherford of [facility] on 05/07/2023. This over-read does not include interpretation of cardiac or coronary anatomy or pathology. The coronary calcium score interpretation by the cardiologist is attached.  COMPARISON:  Cardiac CT 10/23/2017  FINDINGS: Vascular: Aortic atherosclerosis. The included aorta is normal in caliber.  Mediastinum/nodes: No adenopathy or mass. Unremarkable esophagus.  Lungs: No focal airspace disease. No  pulmonary nodule. No pleural fluid. The included airways are patent.  Upper abdomen: No acute or unexpected findings.  Musculoskeletal: There are no acute or suspicious osseous abnormalities.  IMPRESSION: Aortic Atherosclerosis (ICD10-I70.0).   Electronically Signed By: Narda Rutherford M.D. On: 05/07/2023 10:32  Narrative CLINICAL DATA:  Cardiovascular Disease Risk stratification  EXAM: Coronary Calcium Score  TECHNIQUE: A gated, non-contrast computed tomography scan of the heart was performed using 3mm slice thickness. Axial images were analyzed on a dedicated workstation. Calcium scoring of the coronary arteries was performed using the Agatston method.  FINDINGS: Coronary Calcium Score: Prior stent noted in the proximal LAD. Coronary calcium scoring is inaccurate in the setting of prior PCI.  Pericardium: Normal.  Ascending aorta: Normal caliber.  Non-cardiac: See separate report from [facility].  IMPRESSION: 1.  Coronary calcium unable to be performed due to stent in the LAD.  2. Aggressive risk factor modification is recommended for secondary prevention in this patient with known cardiovascular disease.  RECOMMENDATIONS: Coronary artery calcium (CAC) score is a strong predictor of incident coronary heart disease (CHD) and provides predictive information beyond traditional risk factors. CAC scoring is reasonable to use in the decision to withhold, postpone, or initiate statin therapy in intermediate-risk or selected borderline-risk asymptomatic adults (age 10-75 years and LDL-C >=70 to <190 mg/dL) who do not have diabetes or established atherosclerotic cardiovascular disease (ASCVD).* In intermediate-risk (10-year ASCVD risk >=7.5% to <20%) adults or selected borderline-risk (10-year ASCVD risk >=5% to <7.5%) adults in whom a CAC score is measured for the purpose of making a treatment decision the following recommendations have been made:  If  CAC=0, it is reasonable to withhold statin therapy and reassess in 5 to 10 years, as long as higher risk conditions are absent (diabetes mellitus, family history of premature CHD in first degree relatives (males <55 years; females <65 years), cigarette smoking, or LDL >=190 mg/dL).  If CAC is 1 to 99, it is reasonable to initiate statin therapy for patients >=69 years of age.  If CAC is >=100 or >=75th percentile, it is reasonable to initiate statin therapy at any age.  Cardiology referral should be considered for patients with CAC scores >=400 or >=75th percentile.  *2018 AHA/ACC/AACVPR/AAPA/ABC/ACPM/ADA/AGS/APhA/ASPC/NLA/PCNA Guideline on the Management of Blood Cholesterol: A Report of the American College of Cardiology/American Heart Association Task Force on Clinical Practice Guidelines. J Am Coll Cardiol. 2019;73(24):3168-3209.  Lennie Odor, MD  Electronically Signed: By: Lennie Odor M.D. On: 04/30/2023 19:51   CT SCANS  CT CORONARY FRACTIONAL FLOW RESERVE DATA PREP 10/23/2017  Narrative CLINICAL DATA:  FFR- CT  EXAM: FFR-CT  TECHNIQUE: Heart Flow Analysis  FINDINGS: FFR-CT normal in RCA and circumflex.  Abnormal in mid LAD at .53  Patient will be referred for heart catheterization given morphologic lesions in proximal and mid LAD  Suggesting > 70% stenosis  IMPRESSION: Positive FFR CT in mid to distal LAD corresponding to area of soft plaque in mid LAD on CTA  Charlton Haws   Electronically Signed By: Charlton Haws M.D. On: 10/27/2017 12:25   CT CORONARY MORPH W/CTA COR W/SCORE 10/23/2017  Addendum 10/23/2017 12:05 PM ADDENDUM REPORT: 10/23/2017 12:02  CLINICAL DATA:  Chest pain  EXAM: Cardiac CTA  MEDICATIONS: Sub lingual nitro. 4mg  and lopressor 5mg   TECHNIQUE: The patient was scanned on a Siemens Force 192 slice scanner. Gantry rotation speed was 270 msecs. Collimation was .9mm. A 100 kV prospective scan was triggered in the  descending thoracic aorta at 111 HU's with 5% padding centered around 78% of the R-R interval. Average HR during the scan was 54 bpm. The 3D data set was interpreted on a dedicated work station using MPR, MIP and VRT modes. A total of 80cc of contrast was used.  FINDINGS: Non-cardiac: See separate report from [facility]. No significant findings on limited lung and soft tissue windows.  Calcium Score: Calcium noted in LM and proximal LAD  Coronary Arteries: Right dominant with no anomalies  LM: Less than 50% calcified stenosis  LAD: 50-75% soft plaque in mid LAD  IM: Normal  D1: Normal  D2: Normal  Circumflex: Normal  OM1: Normal  OM2: Normal  RCA: Normal and dominant  PDA: Normal  PLA: Normal  IMPRESSION: 1.  Normal aortic root 3.6 cm  2.  Calcified AV and aortic atherosclerosis  3. CAD wit less than 50% calcific LM and 50-75% soft plaque in mid LAD study will be sent for FFR-CT  Charlton Haws   Electronically Signed By: Charlton Haws M.D. On: 10/23/2017 12:02  Narrative EXAM: OVER-READ INTERPRETATION  CT CHEST  The following report is an over-read performed by radiologist Dr. Trudie Reed of [facility] on 10/23/2017. This over-read does not include interpretation of cardiac or coronary anatomy or pathology. The coronary calcium score/coronary CTA interpretation by the cardiologist is attached.  COMPARISON:  Chest CT 09/18/2017.  FINDINGS: Aortic atherosclerosis. Within the visualized portions of the thorax there are no suspicious appearing pulmonary nodules or masses, there is no acute consolidative airspace disease, no pleural effusions, no pneumothorax and no lymphadenopathy. Visualized portions of the upper abdomen are unremarkable. There are no aggressive appearing lytic or blastic lesions noted in the visualized portions of the skeleton.  IMPRESSION: 1.  Aortic Atherosclerosis (ICD10-I70.0).  Electronically  Signed: By: Trudie Reed M.D. On: 10/23/2017 09:36          Current Reported Medications:.    Current Meds  Medication Sig   blood glucose meter kit and supplies Use to test blood sugar daily. Dx: E11.9   clopidogrel (PLAVIX) 75 MG tablet Take 1 tablet (75 mg total) by mouth daily.   clotrimazole-betamethasone (LOTRISONE) cream APPLY 1 APPLICATION TOPICALLY DAILY   ezetimibe (ZETIA) 10 MG tablet Take 1 tablet (10 mg total) by mouth daily.   glipiZIDE (GLUCOTROL) 5 MG tablet Take 1 tablet (5 mg total) by mouth 2 (two) times daily before a meal.   Lancets MISC Use to test blood sugar daily. Dx code :E11.9   losartan (  COZAAR) 50 MG tablet TAKE 1 TABLET BY MOUTH EVERY DAY   metoprolol succinate (TOPROL-XL) 25 MG 24 hr tablet Take 1 tablet (25 mg total) by mouth daily.   Multiple Vitamin (MULTIVITAMIN) tablet Take 1 tablet by mouth daily.   ONETOUCH ULTRA test strip USE TO TEST BLOOD SUGAR ONCE DAILY   tadalafil (CIALIS) 10 MG tablet Take 1 tablet (10 mg total) by mouth daily at 6 (six) AM.   Physical Exam:   VS:  BP 126/68   Pulse 77   Ht 5\' 10"  (1.778 m)   Wt 218 lb (98.9 kg)   SpO2 95%   BMI 31.28 kg/m    Wt Readings from Last 3 Encounters:  09/05/23 218 lb (98.9 kg)  06/24/23 210 lb 8 oz (95.5 kg)  02/25/23 208 lb (94.3 kg)    GEN: Well nourished, well developed in no acute distress NECK: No JVD; No carotid bruits CARDIAC: RRR, 2/6 systolic murmur, no rubs or gallops RESPIRATORY:  Clear to auscultation without rales, wheezing or rhonchi  ABDOMEN: Soft, non-tender, non-distended EXTREMITIES:  No edema; No acute deformity  Asessement and Plan:.    CAD: S/p DES to LAD in 2019.  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 Stable with no anginal symptoms. No indication for ischemic evaluation.  Reviewed ED precautions.  CDL letter provided to patient.  Continue Plavix, losartan, metoprolol and Zetia.  HTN: Initial blood pressure today 136/68. On  recheck was 126/68. Continue current antihypertensive regimen  Hyperlipidemia: Last lipid profile on 06/24/2023 indicated total cholesterol 136, HDL 46.1, triglycerides 127, LDL 64.  History of statin intolerance, was unable to tolerate Repatha and Praluent.  He reports that he had one injection of Leqvio and developed severe muscle pain.  Will have him follow-up with Dr. Rennis Golden for ongoing management.  Carotid bruit: Noted on exam in 09/2020, carotid Doppler showed no evidence of carotid artery stenosis.  No bruit appreciated exam today.  Aortic stenosis: Mild to moderate on echo in 12/2022. Asymptomatic.  Will repeat echocardiogram in 12/2023 for ongoing monitoring.  Type 2 diabetes: Last hemoglobin A1c 6.6 on 06/24/2023.  Monitored and managed per PCP.      Disposition: F/u with Dr. Herbie Baltimore in one year or sooner if needed.   Signed, Rip Harbour, NP

## 2023-09-05 ENCOUNTER — Ambulatory Visit: Payer: Medicare Other | Attending: Cardiology | Admitting: Cardiology

## 2023-09-05 ENCOUNTER — Encounter: Payer: Self-pay | Admitting: Cardiology

## 2023-09-05 VITALS — BP 126/68 | HR 77 | Ht 70.0 in | Wt 218.0 lb

## 2023-09-05 DIAGNOSIS — E7800 Pure hypercholesterolemia, unspecified: Secondary | ICD-10-CM

## 2023-09-05 DIAGNOSIS — I350 Nonrheumatic aortic (valve) stenosis: Secondary | ICD-10-CM

## 2023-09-05 DIAGNOSIS — I10 Essential (primary) hypertension: Secondary | ICD-10-CM

## 2023-09-05 DIAGNOSIS — I2510 Atherosclerotic heart disease of native coronary artery without angina pectoris: Secondary | ICD-10-CM

## 2023-09-05 DIAGNOSIS — E1165 Type 2 diabetes mellitus with hyperglycemia: Secondary | ICD-10-CM

## 2023-09-05 DIAGNOSIS — R0989 Other specified symptoms and signs involving the circulatory and respiratory systems: Secondary | ICD-10-CM | POA: Diagnosis not present

## 2023-09-05 NOTE — Patient Instructions (Addendum)
Medication Instructions:  No changes *If you need a refill on your cardiac medications before your next appointment, please call your pharmacy*  Lab Work: No labs  Testing/Procedures: Echocardiogram in April Your physician has requested that you have an echocardiogram. Echocardiography is a painless test that uses sound waves to create images of your heart. It provides your doctor with information about the size and shape of your heart and how well your heart's chambers and valves are working. This procedure takes approximately one hour. There are no restrictions for this procedure. Please do NOT wear cologne, perfume, aftershave, or lotions (deodorant is allowed). Please arrive 15 minutes prior to your appointment time.  Please note: We ask at that you not bring children with you during ultrasound (echo/ vascular) testing. Due to room size and safety concerns, children are not allowed in the ultrasound rooms during exams. Our front office staff cannot provide observation of children in our lobby area while testing is being conducted. An adult accompanying a patient to their appointment will only be allowed in the ultrasound room at the discretion of the ultrasound technician under special circumstances. We apologize for any inconvenience.   Follow-Up: At [facility], you and your health needs are our priority.  As part of our continuing mission to provide you with exceptional heart care, we have created designated Provider Care Teams.  These Care Teams include your primary Cardiologist (physician) and Advanced Practice Providers (APPs -  Physician Assistants and Nurse Practitioners) who all work together to provide you with the care you need, when you need it.  We recommend signing up for the patient portal called "MyChart".  Sign up information is provided on this After Visit Summary.  MyChart is used to connect with patients for Virtual Visits (Telemedicine).  Patients are able to view  lab/test results, encounter notes, upcoming appointments, etc.  Non-urgent messages can be sent to your provider as well.   To learn more about what you can do with MyChart, go to [email or web address].    Your next appointment:   1 year(s)  Provider:   Bryan Lemma, MD   Other Instructions Please f/u with Dr Rennis Golden for lipid clinic

## 2023-09-26 ENCOUNTER — Ambulatory Visit: Payer: Medicare Other | Admitting: Nurse Practitioner

## 2023-11-14 ENCOUNTER — Other Ambulatory Visit: Payer: Self-pay | Admitting: Emergency Medicine

## 2023-11-14 DIAGNOSIS — E1169 Type 2 diabetes mellitus with other specified complication: Secondary | ICD-10-CM

## 2023-12-09 ENCOUNTER — Ambulatory Visit (HOSPITAL_COMMUNITY): Payer: Medicare Other | Attending: Cardiology

## 2023-12-09 DIAGNOSIS — I350 Nonrheumatic aortic (valve) stenosis: Secondary | ICD-10-CM | POA: Diagnosis not present

## 2023-12-09 DIAGNOSIS — E113292 Type 2 diabetes mellitus with mild nonproliferative diabetic retinopathy without macular edema, left eye: Secondary | ICD-10-CM | POA: Diagnosis not present

## 2023-12-09 DIAGNOSIS — H43813 Vitreous degeneration, bilateral: Secondary | ICD-10-CM | POA: Diagnosis not present

## 2023-12-09 DIAGNOSIS — H26491 Other secondary cataract, right eye: Secondary | ICD-10-CM | POA: Diagnosis not present

## 2023-12-09 LAB — ECHOCARDIOGRAM COMPLETE
AR max vel: 0.86 cm2
AV Area VTI: 0.83 cm2
AV Area mean vel: 0.79 cm2
AV Mean grad: 26.2 mmHg
AV Peak grad: 43.3 mmHg
Ao pk vel: 3.29 m/s
Area-P 1/2: 2.97 cm2
S' Lateral: 3.1 cm

## 2023-12-09 LAB — HM DIABETES EYE EXAM

## 2023-12-10 ENCOUNTER — Encounter: Payer: Self-pay | Admitting: Emergency Medicine

## 2023-12-16 ENCOUNTER — Telehealth: Payer: Self-pay | Admitting: Cardiology

## 2023-12-16 ENCOUNTER — Other Ambulatory Visit: Payer: Self-pay

## 2023-12-16 DIAGNOSIS — I350 Nonrheumatic aortic (valve) stenosis: Secondary | ICD-10-CM

## 2023-12-16 NOTE — Telephone Encounter (Signed)
 Attempted to call patient to review echocardiogram results, no answer. Voicemail left (per DPR) with callback number provided.

## 2023-12-18 ENCOUNTER — Other Ambulatory Visit: Payer: Self-pay | Admitting: Nurse Practitioner

## 2023-12-18 DIAGNOSIS — I2510 Atherosclerotic heart disease of native coronary artery without angina pectoris: Secondary | ICD-10-CM

## 2023-12-20 ENCOUNTER — Other Ambulatory Visit: Payer: Self-pay | Admitting: Emergency Medicine

## 2023-12-20 ENCOUNTER — Other Ambulatory Visit: Payer: Self-pay | Admitting: Nurse Practitioner

## 2023-12-20 DIAGNOSIS — E1159 Type 2 diabetes mellitus with other circulatory complications: Secondary | ICD-10-CM

## 2023-12-20 DIAGNOSIS — Z955 Presence of coronary angioplasty implant and graft: Secondary | ICD-10-CM

## 2023-12-23 ENCOUNTER — Ambulatory Visit: Payer: Medicare Other | Admitting: Emergency Medicine

## 2023-12-26 ENCOUNTER — Ambulatory Visit (INDEPENDENT_AMBULATORY_CARE_PROVIDER_SITE_OTHER): Payer: Medicare Other | Admitting: Internal Medicine

## 2023-12-26 ENCOUNTER — Encounter (HOSPITAL_BASED_OUTPATIENT_CLINIC_OR_DEPARTMENT_OTHER): Payer: Self-pay | Admitting: Internal Medicine

## 2023-12-26 VITALS — BP 142/64 | HR 67 | Ht 70.0 in | Wt 216.8 lb

## 2023-12-26 DIAGNOSIS — I350 Nonrheumatic aortic (valve) stenosis: Secondary | ICD-10-CM

## 2023-12-26 DIAGNOSIS — I2510 Atherosclerotic heart disease of native coronary artery without angina pectoris: Secondary | ICD-10-CM

## 2023-12-26 DIAGNOSIS — E7800 Pure hypercholesterolemia, unspecified: Secondary | ICD-10-CM | POA: Diagnosis not present

## 2023-12-26 DIAGNOSIS — E785 Hyperlipidemia, unspecified: Secondary | ICD-10-CM | POA: Diagnosis not present

## 2023-12-26 NOTE — Progress Notes (Signed)
 LIPID CLINIC CONSULT NOTE  Chief Complaint:  Follow-up dyslipidemia  Primary Care Physician: Georgina Quint, MD  Primary Cardiologist:  Bryan Lemma, MD  HPI:  Gregory Alamo Fabrizzio Marcella. is a 78 y.o. male who is being seen today for the evaluation of lipidemia at the request of Georgina Quint, *.  This is a pleasant 78 year old male patient of Dr. Herbie Baltimore with a history of atherosclerotic cardiovascular disease and dyslipidemia.  Recent lipid showed total cholesterol 152, triglycerides 151, HDL 34 and LDL 83, down from 106.  He has had a previous calcium score which was 111, 64th percentile for age and sex matched controls.  Previously tried 4 different statins and Repatha, most recently, all of which caused muscle aches.  01/16/2023  Gregory Mathews returns today for follow-up.  He had repeat labs and fortunately had a negative LP(a) at 16 nmol/L.  His LDL particle numbers however remain elevated at 1526 with an LDL-C of 102, HDL 42 and triglycerides 121.  He is only on ezetimibe.  We had discussed starting Leqvio and we were able to achieve prior authorization for this.  It would pretty much caused him nothing I believe to get the therapy however when he was contacted to schedule it he declined it because of "concerns for side effects".  I discussed it with him further today.  He does not recall what his concern was at the time.  After talking about it in more detail he now thinks that he would be amenable to getting the injections.  His approval is still active.  12/26/2023  Gregory Mathews is seen today in follow-up.  He unfortunately could not tolerate the Leqvio.  I believe he had a single injection last year and had some acute on chronic left sided leg pain issues which have not yet resolved.  His lipids however did look much better last October.  Total cholesterol 136, triglycerides 127, HDL 46 and LDL 64 however it is not clear whether this is a result of the Leqvio.  Subsequently he has  been on no lipid-lowering therapy other than the ezetimibe.  He has had a number of other medication changes including the addition of tadalafil and testosterone as well as he started taking supplements including K2.  He had a recent echocardiogram and I was asked by Dr. Herbie Baltimore to evaluate for any possible symptoms as he has had worsening aortic valve stenosis.  This study mentioned that he now has moderate to severe AAS with a valve gradient of 26 mmHg.  He reported no symptoms with exertion to me in fact he says has been exercising more recently and says he feels much better.  PMHx:  Past Medical History:  Diagnosis Date   Allergy    Basal cell carcinoma 01/2013   Nasal and R forearm.  [facility]   CAD S/P percutaneous coronary angioplasty    2/19 PCI/DESx1 to mLAD, FFR 0.7 -Sierra DES 2.75 mm x 18 mm   Cataract    Diabetes mellitus without complication (HCC)    on PO Meds   Essential hypertension    Family history of colon cancer    Family history of malignant neoplasm of breast    Heart murmur    Hyperlipidemia    statin intolerant -- myalgias, fatigue (tried at least 4)   Polyposis of colon    Rosacea     Past Surgical History:  Procedure Laterality Date   basal cell removal     nose and wrist  BELPHAROPTOSIS REPAIR     CORONARY CT ANGIOGRAM  09/2017   Coronary Calcium Score: 111. Coronary calcification noted in the LEFT MAIN (LM) and prox LAD.  LM < 50% calcified stenosis.  Mid LAD 50-75% plaque noted CT FFR --> positive at 0.80.  Recommend CARDIAC CATHETERIZATION   CORONARY PRESSURE/FFR STUDY N/A 11/12/2017   Procedure: INTRAVASCULAR PRESSURE WIRE/FFR STUDY;  Surgeon: Marykay Lex, MD;  Location[facility] INVASIVE CV LAB;  Service: Cardiovascular;  Laterality: LAD -FFR 0.76   CORONARY STENT INTERVENTION N/A 11/12/2017   Procedure: CORONARY STENT INTERVENTION;  Surgeon: Marykay Lex, MD;  Location: [facility] INVASIVE CV LAB;  Service: Cardiovascular:  DES PCI:  STENT  SIERRA 2.75 X 18 MM - Post intervention, there is a 0% residual stenosis.   ETT/GXT: Graded Exercise Tolerance Test  08/2015    Exercised for 9:01 min --> blunted blood pressure response no EKG changes.  No chest pain.  Low Risk   EYE SURGERY     Cataract surgery   eyelid surgery     LEFT HEART CATH AND CORONARY ANGIOGRAPHY N/A 11/12/2017   Procedure: LEFT HEART CATH AND CORONARY ANGIOGRAPHY;  Surgeon: Marykay Lex, MD;  Location: [facility] INVASIVE CV LAB: 70% P-M LAD (FFR 0.76).  40% mid LAD, 50% ostial ramus.  EF 55-60%.  Mildly elevated LVEDP.   SHOULDER OPEN ROTATOR CUFF REPAIR  1983   TRANSTHORACIC ECHOCARDIOGRAM  08/2017   EF 55-60%.  Mild aortic stenosis (mean gradient 11 mmH.)  GR 1 DD.  Mild LA dilation.    FAMHx:  Family History  Problem Relation Age of Onset   Leukemia Mother 67       d. 75   Stroke Father 36       as a complication of CABG   Colon cancer Father 51   Coronary artery disease Father 88       - Sx was DOE - MV CAD   Peripheral Artery Disease Father        presumptive   Diabetes Sister    Sudden Cardiac Death Sister 85       presumed MI   Heart attack Brother 71       During throat surgery   Congestive Heart Failure Brother        Died @ 54 - ? CHF / ICM   Breast cancer Maternal Grandmother        dx in her 28s   Stroke Maternal Grandfather    Diabetes Brother 47       on lots of meds   Diabetes Brother    Diabetes Paternal Grandmother    Rheumatic fever Brother        childhood RF --> Valvular Dz - valve Sgx,  died young   Stomach cancer Neg Hx    Esophageal cancer Neg Hx     SOCHx:   reports that he quit smoking about 41 years ago. His smoking use included cigarettes. He started smoking about 56 years ago. He has a 15 pack-year smoking history. He has never used smokeless tobacco. He reports that he does not drink alcohol and does not use drugs.  ALLERGIES:  Allergies  Allergen Reactions   Januvia [Sitagliptin] Other (See Comments)    Gi  upset and kidney problems   Lisinopril Cough   Repatha [Evolocumab] Other (See Comments)    Myalgias    Statins Other (See Comments)    Severe leg pain & fatigue -- tried at least 4 (even  low dose)    ROS: Pertinent items noted in HPI and remainder of comprehensive ROS otherwise negative.  HOME MEDS: Current Outpatient Medications on File Prior to Visit  Medication Sig Dispense Refill   blood glucose meter kit and supplies Use to test blood sugar daily. Dx: E11.9 1 each 0   clopidogrel (PLAVIX) 75 MG tablet TAKE 1 TABLET BY MOUTH EVERY DAY 90 tablet 3   clotrimazole-betamethasone (LOTRISONE) cream APPLY 1 APPLICATION TOPICALLY DAILY 30 g 0   ezetimibe (ZETIA) 10 MG tablet TAKE 1 TABLET BY MOUTH EVERY DAY 90 tablet 1   glipiZIDE (GLUCOTROL) 5 MG tablet Take 1 tablet (5 mg total) by mouth 2 (two) times daily before a meal. 180 tablet 3   Lancets MISC Use to test blood sugar daily. Dx code :E11.9 100 each 3   losartan (COZAAR) 50 MG tablet TAKE 1 TABLET BY MOUTH EVERY DAY 90 tablet 3   metoprolol succinate (TOPROL-XL) 25 MG 24 hr tablet TAKE 1 TABLET (25 MG TOTAL) BY MOUTH DAILY. 90 tablet 2   Multiple Vitamin (MULTIVITAMIN) tablet Take 1 tablet by mouth daily.     ONETOUCH ULTRA test strip USE TO TEST BLOOD SUGAR ONCE DAILY 100 strip 11   Semaglutide,0.25 or 0.5MG /DOS, 2 MG/3ML SOPN Inject 0.5 mg into the skin once a week. 3 mL 5   tadalafil (CIALIS) 10 MG tablet Take 1 tablet (10 mg total) by mouth daily at 6 (six) AM. 90 tablet 3   testosterone cypionate (DEPOTESTOSTERONE CYPIONATE) 200 MG/ML injection Inject 0.75 mg into the muscle once a week.     No current facility-administered medications on file prior to visit.    LABS/IMAGING: No results found for this or any previous visit (from the past 48 hours). No results found.  LIPID PANEL:    Component Value Date/Time   CHOL 136 06/24/2023 1022   CHOL 86 (L) 11/16/2020 0857   TRIG 127.0 06/24/2023 1022   HDL 46.10 06/24/2023  1022   HDL 41 11/16/2020 0857   CHOLHDL 3 06/24/2023 1022   VLDL 25.4 06/24/2023 1022   LDLCALC 64 06/24/2023 1022   LDLCALC 24 11/16/2020 0857    WEIGHTS: Wt Readings from Last 3 Encounters:  12/26/23 216 lb 12.8 oz (98.3 kg)  09/05/23 218 lb (98.9 kg)  06/24/23 210 lb 8 oz (95.5 kg)    VITALS: BP (!) 142/64   Pulse 67   Ht 5\' 10"  (1.778 m)   Wt 216 lb 12.8 oz (98.3 kg)   SpO2 98%   BMI 31.11 kg/m   EXAM: Heart: regular rate and rhythm, S1, S2 normal, and systolic murmur: late systolic 3/6, blowing at 2nd right intercostal space   EKG: Deferred  ASSESSMENT: Mixed dyslipidemia, goal LDL <70 Hypertension Myalgia on statins Myalgia on Repatha Left leg pain with Leqvio  PLAN: 1.   Gregory Mathews has not been tolerant of lipid-lowering therapies other than the ezetimibe.  His lipids were at goal however I think there were lasting effects from the [facility] and I would like to repeat his cholesterol testing now to see if it has been stable.  Additionally, I evaluated his aortic stenosis today.  On exam he has a late peaking murmur but a clear second heart sound.  This is moderate AS and he is asymptomatic.  Would advise a repeat echo probably in 6-12 months for close follow-up with Dr. Herbie Baltimore.  I will contact him with the results of his lab work.  With few options of  therapy, likely he can follow-up with me as needed.  Chrystie Nose, MD, [facility], FACP  [place]  [facility]  Medical Director of the Advanced Lipid Disorders &  Cardiovascular Risk Reduction Clinic Diplomate of the American Board of Clinical Lipidology Attending Cardiologist  Direct Dial: [phone]  Fax: [phone]  Website:  [email or web address] 12/26/2023, 8:46 AM

## 2023-12-26 NOTE — Patient Instructions (Addendum)
 Medication Instructions:  Your physician recommends that you continue on your current medications as directed. Please refer to the Current Medication list given to you today.   Labwork: FASTING LIPID SOON   Testing/Procedures: NONE   Follow-Up: AS NEEDED

## 2023-12-29 ENCOUNTER — Encounter: Payer: Self-pay | Admitting: Emergency Medicine

## 2023-12-29 ENCOUNTER — Ambulatory Visit (INDEPENDENT_AMBULATORY_CARE_PROVIDER_SITE_OTHER): Payer: Medicare Other | Admitting: Emergency Medicine

## 2023-12-29 VITALS — BP 140/78 | HR 64 | Temp 97.8°F | Ht 70.0 in | Wt 217.0 lb

## 2023-12-29 DIAGNOSIS — E785 Hyperlipidemia, unspecified: Secondary | ICD-10-CM | POA: Diagnosis not present

## 2023-12-29 DIAGNOSIS — E1159 Type 2 diabetes mellitus with other circulatory complications: Secondary | ICD-10-CM

## 2023-12-29 DIAGNOSIS — I350 Nonrheumatic aortic (valve) stenosis: Secondary | ICD-10-CM | POA: Diagnosis not present

## 2023-12-29 DIAGNOSIS — I152 Hypertension secondary to endocrine disorders: Secondary | ICD-10-CM | POA: Diagnosis not present

## 2023-12-29 DIAGNOSIS — I2510 Atherosclerotic heart disease of native coronary artery without angina pectoris: Secondary | ICD-10-CM

## 2023-12-29 DIAGNOSIS — I700 Atherosclerosis of aorta: Secondary | ICD-10-CM | POA: Diagnosis not present

## 2023-12-29 DIAGNOSIS — I25119 Atherosclerotic heart disease of native coronary artery with unspecified angina pectoris: Secondary | ICD-10-CM | POA: Diagnosis not present

## 2023-12-29 DIAGNOSIS — E1169 Type 2 diabetes mellitus with other specified complication: Secondary | ICD-10-CM

## 2023-12-29 DIAGNOSIS — Z7984 Long term (current) use of oral hypoglycemic drugs: Secondary | ICD-10-CM | POA: Diagnosis not present

## 2023-12-29 LAB — COMPREHENSIVE METABOLIC PANEL WITH GFR
ALT: 18 U/L (ref 0–53)
AST: 18 U/L (ref 0–37)
Albumin: 4.7 g/dL (ref 3.5–5.2)
Alkaline Phosphatase: 46 U/L (ref 39–117)
BUN: 15 mg/dL (ref 6–23)
CO2: 28 meq/L (ref 19–32)
Calcium: 10.3 mg/dL (ref 8.4–10.5)
Chloride: 105 meq/L (ref 96–112)
Creatinine, Ser: 0.72 mg/dL (ref 0.40–1.50)
GFR: 89.09 mL/min (ref >60.00)
Glucose, Bld: 128 mg/dL — ABNORMAL HIGH (ref 70–99)
Potassium: 4.8 meq/L (ref 3.5–5.1)
Sodium: 140 meq/L (ref 135–145)
Total Bilirubin: 0.7 mg/dL (ref 0.2–1.2)
Total Protein: 7.7 g/dL (ref 6.0–8.3)

## 2023-12-29 LAB — POCT GLYCOSYLATED HEMOGLOBIN (HGB A1C): Hemoglobin A1C: 7.4 % — AB (ref 4.0–5.6)

## 2023-12-29 LAB — LIPID PANEL
Cholesterol: 136 mg/dL (ref 0–200)
HDL: 37.5 mg/dL — ABNORMAL LOW (ref >39.00)
LDL Cholesterol: 76 mg/dL (ref 0–99)
NonHDL: 98.8
Total CHOL/HDL Ratio: 4
Triglycerides: 115 mg/dL (ref 0.0–149.0)
VLDL: 23 mg/dL (ref 0.0–40.0)

## 2023-12-29 MED ORDER — OZEMPIC (0.25 OR 0.5 MG/DOSE) 2 MG/3ML ~~LOC~~ SOPN: 0.2500 mg | PEN_INJECTOR | SUBCUTANEOUS | 5 refills | Status: DC

## 2023-12-29 NOTE — Assessment & Plan Note (Signed)
 Uncontrolled diabetes with hemoglobin A1c of 7.4 Recommend to take glipizide twice a day and start weekly Ozempic Continue Zetia 10 mg daily Diet and nutrition discussed

## 2023-12-29 NOTE — Progress Notes (Signed)
 Gregory Mathews. 78 y.o.   Chief Complaint  Patient presents with   Follow-up    6 month f/u for HTN / DM. Patient has no other concerns     HISTORY OF PRESENT ILLNESS: This is a 78 y.o. male here for 81-month follow-up of chronic medical conditions Recently seen by cardiologist last week after getting echocardiogram Cardiologist office visit assessment and plan as follows:  ASSESSMENT: Mixed dyslipidemia, goal LDL <70 Hypertension Myalgia on statins Myalgia on Repatha Left leg pain with Leqvio   PLAN: 1.   Mr. Mcnutt has not been tolerant of lipid-lowering therapies other than the ezetimibe.  His lipids were at goal however I think there were lasting effects from the [facility] and I would like to repeat his cholesterol testing now to see if it has been stable.  Additionally, I evaluated his aortic stenosis today.  On exam he has a late peaking murmur but a clear second heart sound.  This is moderate AS and he is asymptomatic.  Would advise a repeat echo probably in 6-12 months for close follow-up with Dr. Herbie Baltimore.  I will contact him with the results of his lab work.   With few options of therapy, likely he can follow-up with me as needed.   Chrystie Nose, MD, [facility], FACP  [place]  [facility]  Medical Director of the [facility] Lipid [facility] &  Cardiovascular Risk Reduction Clinic Diplomate of the American Board of Clinical Lipidology Attending Cardiologist  Direct Dial: [phone]  Fax: [phone]  Website:  [email or web address]   Chrystie Nose 12/26/2023, 8:46 AM  HPI   Prior to Admission medications   Medication Sig Start Date End Date Taking? Authorizing Provider  clopidogrel (PLAVIX) 75 MG tablet TAKE 1 TABLET BY MOUTH EVERY DAY 12/22/23  Yes Monge, Petra Kuba, NP  clotrimazole-betamethasone (LOTRISONE) cream APPLY 1 APPLICATION TOPICALLY DAILY 02/07/23  Yes Clelia Trabucco, Eilleen Kempf, MD  ezetimibe (ZETIA) 10 MG tablet TAKE 1 TABLET BY MOUTH EVERY DAY 11/14/23   Yes Dorette Hartel, Eilleen Kempf, MD  glipiZIDE (GLUCOTROL) 5 MG tablet Take 1 tablet (5 mg total) by mouth 2 (two) times daily before a meal. 02/24/23 02/19/24 Yes Emari Hreha, Eilleen Kempf, MD  Lancets MISC Use to test blood sugar daily. Dx code :E11.9 07/08/19  Yes Georgina Quint, MD  losartan (COZAAR) 50 MG tablet TAKE 1 TABLET BY MOUTH EVERY DAY 12/20/23  Yes Calah Gershman, Eilleen Kempf, MD  metoprolol succinate (TOPROL-XL) 25 MG 24 hr tablet TAKE 1 TABLET (25 MG TOTAL) BY MOUTH DAILY. 12/18/23  Yes Monge, Petra Kuba, NP  Multiple Vitamin (MULTIVITAMIN) tablet Take 1 tablet by mouth daily.   Yes [provider]  [facility] ULTRA test strip USE TO TEST BLOOD SUGAR ONCE DAILY 02/07/23  Yes Kirsta Probert, Eilleen Kempf, MD  tadalafil (CIALIS) 10 MG tablet Take 1 tablet (10 mg total) by mouth daily at 6 (six) AM. 06/24/23 06/18/24 Yes Ronne Stefanski, Eilleen Kempf, MD  testosterone cypionate (DEPOTESTOSTERONE CYPIONATE) 200 MG/ML injection Inject 0.75 mg into the muscle once a week. 10/25/23  Yes [provider]  blood glucose meter kit and supplies Use to test blood sugar daily. Dx: E11.9 10/11/15   Ofilia Neas, PA-C  Semaglutide,0.25 or 0.5MG /DOS, 2 MG/3ML SOPN Inject 0.5 mg into the skin once a week. Patient not taking: Reported on 12/29/2023 02/24/23   Georgina Quint, MD    Allergies  Allergen Reactions   Januvia [Sitagliptin] Other (See Comments)    Gi upset and kidney problems  Lisinopril Cough   Repatha [Evolocumab] Other (See Comments)    Myalgias    Statins Other (See Comments)    Severe leg pain & fatigue -- tried at least 4 (even low dose)    Patient Active Problem List   Diagnosis Date Noted   Lower urinary tract symptoms 06/24/2023   Bilateral hip pain 06/24/2023   Left lumbar radiculopathy 12/31/2022   CAD (coronary artery disease) 06/11/2022   Atherosclerosis of aorta (HCC) 02/15/2021   Family history of colon cancer    Family history of malignant neoplasm of breast    Polyposis  of colon    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   Clotting disorder (HCC) 05/17/2020   Skin cancer, basal cell 11/18/2019   Uncircumcised male 03/11/2018   Coronary artery disease involving native coronary artery of native heart with angina pectoris (HCC) 11/04/2017   Abnormal findings on diagnostic imaging of cardiovascular system 11/04/2017   Agatston coronary artery calcium score between 100 and 199 09/22/2017   Family history of early CAD 09/11/2017   Mild aortic stenosis by prior echocardiogram 09/11/2017   Metabolic syndrome 09/11/2017   Hypogonadism in male 08/14/2016   BMI 31.0-31.9,adult 03/13/2016   Hypertension associated with diabetes (HCC) 08/29/2015   Diverticulosis of colon without hemorrhage 08/01/2015   Statin intolerance 08/01/2015   Basal cell carcinoma of nose 08/23/2014   Seasonal allergies 01/26/2013   Type 2 diabetes mellitus with hyperglycemia, without long-term current use of insulin (HCC) 07/09/2012   Hyperlipidemia associated with type 2 diabetes mellitus (HCC) 07/09/2012   Erectile dysfunction 07/09/2012    Past Medical History:  Diagnosis Date   Allergy    Basal cell carcinoma 01/2013   Nasal and R forearm.  [facility]   CAD S/P percutaneous coronary angioplasty    2/19 PCI/DESx1 to mLAD, FFR 0.7 -Sierra DES 2.75 mm x 18 mm   Cataract    Diabetes mellitus without complication (HCC)    on PO Meds   Essential hypertension    Family history of colon cancer    Family history of malignant neoplasm of breast    Heart murmur    Hyperlipidemia    statin intolerant -- myalgias, fatigue (tried at least 4)   Polyposis of colon    Rosacea     Past Surgical History:  Procedure Laterality Date   basal cell removal     nose and wrist    BELPHAROPTOSIS REPAIR     CORONARY CT ANGIOGRAM  09/2017   Coronary Calcium Score: 111. Coronary calcification noted in the LEFT MAIN (LM) and prox LAD.  LM < 50% calcified stenosis.  Mid LAD 50-75% plaque noted CT FFR  --> positive at 0.80.  Recommend CARDIAC CATHETERIZATION   CORONARY PRESSURE/FFR STUDY N/A 11/12/2017   Procedure: INTRAVASCULAR PRESSURE WIRE/FFR STUDY;  Surgeon: Marykay Lex, MD;  Location: [facility] INVASIVE CV LAB;  Service: Cardiovascular;  Laterality: LAD -FFR 0.76   CORONARY STENT INTERVENTION N/A 11/12/2017   Procedure: CORONARY STENT INTERVENTION;  Surgeon: Marykay Lex, MD;  Location: [facility] INVASIVE CV LAB;  Service: Cardiovascular:  DES PCI:  STENT SIERRA 2.75 X 18 MM - Post intervention, there is a 0% residual stenosis.   ETT/GXT: Graded Exercise Tolerance Test  08/2015    Exercised for 9:01 min --> blunted blood pressure response no EKG changes.  No chest pain.  Low Risk   EYE SURGERY     Cataract surgery   eyelid surgery     LEFT HEART CATH AND CORONARY  ANGIOGRAPHY N/A 11/12/2017   Procedure: LEFT HEART CATH AND CORONARY ANGIOGRAPHY;  Surgeon: Marykay Lex, MD;  Location: [facility] INVASIVE CV LAB: 70% P-M LAD (FFR 0.76).  40% mid LAD, 50% ostial ramus.  EF 55-60%.  Mildly elevated LVEDP.   SHOULDER OPEN ROTATOR CUFF REPAIR  1983   TRANSTHORACIC ECHOCARDIOGRAM  08/2017   EF 55-60%.  Mild aortic stenosis (mean gradient 11 mmH.)  GR 1 DD.  Mild LA dilation.    Social History   Socioeconomic History   Marital status: Married    Spouse name: Bonita Quin   Number of children: 4   Years of education: 20   Highest education level: Not on file  Occupational History   Occupation: bus Programme researcher, broadcasting/film/video: [employer]  Tobacco Use   Smoking status: Former    Current packs/day: 0.00    Average packs/day: 1 pack/day for 15.0 years (15.0 ttl pk-yrs)    Types: Cigarettes    Start date: 07/10/1967    Quit date: 07/09/1982    Years since quitting: 41.5   Smokeless tobacco: Never  Substance and Sexual Activity   Alcohol use: No   Drug use: No   Sexual activity: Yes    Partners: Female  Other Topics Concern   Not on file  Social History Narrative   Marital status:  married x 46 years; happily married.   Lives: with wife       Children:  4 children; 4 grandchildren. Adult children all live locally.          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      Tobacco:  In past; smoked x 15 years; quit 35 years ago.      Alcohol: none      ADLs: independent with all ADLs.  Does not use assistant device with ambulation.      Living Will:  Has living will; desires FULL CODE; no prolonged measures.      Doctoral degree in Biblical Studies.          Diet: eats twice daily, eggs,sausage,and fruits for breakfast; chicken (fried or broil), occassional pizza, salads, steak and pork chops.        Exercise: cardiac rehab, transition to [facility] - 45 minutes walks and resistance 5x/week   Social Drivers of Health   Financial Resource Strain: Low Risk  (06/24/2022)   Overall Financial Resource Strain (CARDIA)    Difficulty of Paying Living Expenses: Not hard at all  Food Insecurity: No Food Insecurity (06/24/2022)   Hunger Vital Sign    Worried About Running Out of Food in the Last Year: Never true    Ran Out of Food in the Last Year: Never true  Transportation Needs: No Transportation Needs (06/24/2022)   PRAPARE - Administrator, Civil Service (Medical): No    Lack of Transportation (Non-Medical): No  Physical Activity: Sufficiently Active (06/24/2022)   Exercise Vital Sign    Days of Exercise per Week: 5 days    Minutes of Exercise per Session: 60 min  Stress: No Stress Concern Present (06/24/2022)   [employer] of Occupational Health - Occupational Stress Questionnaire    Feeling of Stress : Not at all  Social Connections: Socially Integrated (06/24/2022)   Social Connection and Isolation Panel [NHANES]    Frequency of Communication with Friends and Family: More than three times a week    Frequency of Social Gatherings with  Friends and Family: More than three times a week     Attends Religious Services: More than 4 times per year    Active Member of Clubs or Organizations: Yes    Attends Engineer, structural: More than 4 times per year    Marital Status: Married  Catering manager Violence: Not At Risk (06/24/2022)   Humiliation, Afraid, Rape, and Kick questionnaire    Fear of Current or Ex-Partner: No    Emotionally Abused: No    Physically Abused: No    Sexually Abused: No    Family History  Problem Relation Age of Onset   Leukemia Mother 1       d. 37   Stroke Father 58       as a complication of CABG   Colon cancer Father 54   Coronary artery disease Father 19       - Sx was DOE - MV CAD   Peripheral Artery Disease Father        presumptive   Diabetes Sister    Sudden Cardiac Death Sister 75       presumed MI   Heart attack Brother 27       During throat surgery   Congestive Heart Failure Brother        Died @ 39 - ? CHF / ICM   Breast cancer Maternal Grandmother        dx in her 3s   Stroke Maternal Grandfather    Diabetes Brother 14       on lots of meds   Diabetes Brother    Diabetes Paternal Grandmother    Rheumatic fever Brother        childhood RF --> Valvular Dz - valve Sgx,  died young   Stomach cancer Neg Hx    Esophageal cancer Neg Hx      Review of Systems  Constitutional: Negative.  Negative for chills and fever.  HENT: Negative.  Negative for congestion and sore throat.   Respiratory: Negative.  Negative for cough and shortness of breath.   Cardiovascular: Negative.  Negative for chest pain and palpitations.  Gastrointestinal:  Negative for abdominal pain, diarrhea, nausea and vomiting.  Genitourinary: Negative.  Negative for dysuria and hematuria.  Skin: Negative.  Negative for rash.  Neurological: Negative.  Negative for dizziness and headaches.  All other systems reviewed and are negative.   Vitals:   12/29/23 0817  BP: (!) 140/78  Pulse: 64  Temp: 97.8 F (36.6 C)  SpO2: 95%    Physical  Exam Vitals reviewed.  Constitutional:      Appearance: Normal appearance.  HENT:     Head: Normocephalic.     Mouth/Throat:     Mouth: Mucous membranes are moist.     Pharynx: Oropharynx is clear.  Eyes:     Extraocular Movements: Extraocular movements intact.     Pupils: Pupils are equal, round, and reactive to light.  Cardiovascular:     Rate and Rhythm: Normal rate and regular rhythm.     Heart sounds: Murmur heard.  Pulmonary:     Effort: Pulmonary effort is normal.     Breath sounds: Normal breath sounds.  Abdominal:     Palpations: Abdomen is soft.     Tenderness: There is no abdominal tenderness.  Musculoskeletal:     Cervical back: No tenderness.  Lymphadenopathy:     Cervical: No cervical adenopathy.  Skin:    General: Skin is warm and dry.  Capillary Refill: Capillary refill takes less than 2 seconds.  Neurological:     General: No focal deficit present.     Mental Status: He is alert and oriented to person, place, and time.  Psychiatric:        Mood and Affect: Mood normal.        Behavior: Behavior normal.    Results for orders placed or performed in visit on 12/29/23 (from the past 24 hours)  Comprehensive metabolic panel with GFR     Status: Abnormal   Collection Time: 12/29/23  9:00 AM  Result Value Ref Range   Sodium 140 135 - 145 mEq/L   Potassium 4.8 3.5 - 5.1 mEq/L   Chloride 105 96 - 112 mEq/L   CO2 28 19 - 32 mEq/L   Glucose, Bld 128 (H) 70 - 99 mg/dL   BUN 15 6 - 23 mg/dL   Creatinine, Ser 1.61 0.40 - 1.50 mg/dL   Total Bilirubin 0.7 0.2 - 1.2 mg/dL   Alkaline Phosphatase 46 39 - 117 U/L   AST 18 0 - 37 U/L   ALT 18 0 - 53 U/L   Total Protein 7.7 6.0 - 8.3 g/dL   Albumin 4.7 3.5 - 5.2 g/dL   GFR 09.60 >45.40 mL/min   Calcium 10.3 8.4 - 10.5 mg/dL  Lipid panel     Status: Abnormal   Collection Time: 12/29/23  9:00 AM  Result Value Ref Range   Cholesterol 136 0 - 200 mg/dL   Triglycerides 981.1 0.0 - 149.0 mg/dL   HDL 91.47 (L)  >82.95 mg/dL   VLDL 62.1 0.0 - 30.8 mg/dL   LDL Cholesterol 76 0 - 99 mg/dL   Total CHOL/HDL Ratio 4    NonHDL 98.80   POCT HgB A1C     Status: Abnormal   Collection Time: 12/29/23  9:50 AM  Result Value Ref Range   Hemoglobin A1C 7.4 (A) 4.0 - 5.6 %   HbA1c POC (<> result, manual entry)     HbA1c, POC (prediabetic range)     HbA1c, POC (controlled diabetic range)       ASSESSMENT & PLAN: A total of 44 minutes was spent with the patient and counseling/coordination of care regarding preparing for this visit, review of most recent office visit notes, review of multiple chronic medical conditions and their management, cardiovascular risks associated with uncontrolled diabetes, review of all medications and changes made, review of most recent bloodwork results including interpretation of today's hemoglobin A1c, review of health maintenance items, education on nutrition, prognosis, documentation, and need for follow up.   Problem List Items Addressed This Visit       Cardiovascular and Mediastinum   Hypertension associated with diabetes (HCC) - Primary (Chronic)   Well-controlled hypertension Continues losartan 50 mg and metoprolol succinate 25 mg daily Uncontrolled diabetes with hemoglobin A1c of 7.4 Taking glipizide only once a day.  Advised to start glipizide 5 mg twice a day Recommend to restart weekly Ozempic Has developed lots of side effects to most other diabetic medications.  Mostly GI side effects. Cardiovascular risks associated with hypertension and diabetes discussed Diet and nutrition discussed      Relevant Medications   Semaglutide,0.25 or 0.5MG /DOS, (OZEMPIC, 0.25 OR 0.5 MG/DOSE,) 2 MG/3ML SOPN   Other Relevant Orders   Lipid panel (Completed)   Comprehensive metabolic panel with GFR (Completed)   Nonrheumatic aortic valve stenosis   Stable and asymptomatic. Murmur on exam.       Coronary  artery disease involving native coronary artery of native heart with  angina pectoris (HCC)   Stable.  No recent anginal episodes Exercises regularly Continues daily Plavix 75 mg      Atherosclerosis of aorta (HCC)   Diet and nutrition discussed Intolerant to statins Intolerant to Repatha Continues Zetia 10 mg daily      CAD (coronary artery disease)     Endocrine   Hyperlipidemia associated with type 2 diabetes mellitus (HCC) (Chronic)   Uncontrolled diabetes with hemoglobin A1c of 7.4 Recommend to take glipizide twice a day and start weekly Ozempic Continue Zetia 10 mg daily Diet and nutrition discussed      Relevant Medications   Semaglutide,0.25 or 0.5MG /DOS, (OZEMPIC, 0.25 OR 0.5 MG/DOSE,) 2 MG/3ML SOPN   Other Relevant Orders   POCT HgB A1C (Completed)   Lipid panel (Completed)   Comprehensive metabolic panel with GFR (Completed)   Patient Instructions  Diabetes Mellitus and Nutrition, Adult When you have diabetes, or diabetes mellitus, it is very important to have healthy eating habits because your blood sugar (glucose) levels are greatly affected by what you eat and drink. Eating healthy foods in the right amounts, at about the same times every day, can help you: Manage your blood glucose. Lower your risk of heart disease. Improve your blood pressure. Reach or maintain a healthy weight. What can affect my meal plan? Every person with diabetes is different, and each person has different needs for a meal plan. Your health care provider may recommend that you work with a dietitian to make a meal plan that is best for you. Your meal plan may vary depending on factors such as: The calories you need. The medicines you take. Your weight. Your blood glucose, blood pressure, and cholesterol levels. Your activity level. Other health conditions you have, such as heart or kidney disease. How do carbohydrates affect me? Carbohydrates, also called carbs, affect your blood glucose level more than any other type of food. Eating carbs raises the  amount of glucose in your blood. It is important to know how many carbs you can safely have in each meal. This is different for every person. Your dietitian can help you calculate how many carbs you should have at each meal and for each snack. How does alcohol affect me? Alcohol can cause a decrease in blood glucose (hypoglycemia), especially if you use insulin or take certain diabetes medicines by mouth. Hypoglycemia can be a life-threatening condition. Symptoms of hypoglycemia, such as sleepiness, dizziness, and confusion, are similar to symptoms of having too much alcohol. Do not drink alcohol if: Your health care provider tells you not to drink. You are pregnant, may be pregnant, or are planning to become pregnant. If you drink alcohol: Limit how much you have to: 0-1 drink a day for women. 0-2 drinks a day for men. Know how much alcohol is in your drink. In the [place] one drink equals one 12 oz bottle of beer (355 mL), one 5 oz glass of wine (148 mL), or one 1 oz glass of hard liquor (44 mL). Keep yourself hydrated with water, diet soda, or unsweetened iced tea. Keep in mind that regular soda, juice, and other mixers may contain a lot of sugar and must be counted as carbs. What are tips for following this plan?  Reading food labels Start by checking the serving size on the Nutrition Facts label of packaged foods and drinks. The number of calories and the amount of carbs, fats, and other nutrients  listed on the label are based on one serving of the item. Many items contain more than one serving per package. Check the total grams (g) of carbs in one serving. Check the number of grams of saturated fats and trans fats in one serving. Choose foods that have a low amount or none of these fats. Check the number of milligrams (mg) of salt (sodium) in one serving. Most people should limit total sodium intake to less than 2,300 mg per day. Always check the nutrition information of foods labeled as  "low-fat" or "nonfat." These foods may be higher in added sugar or refined carbs and should be avoided. Talk to your dietitian to identify your daily goals for nutrients listed on the label. Shopping Avoid buying canned, pre-made, or processed foods. These foods tend to be high in fat, sodium, and added sugar. Shop around the outside edge of the grocery store. This is where you will most often find fresh fruits and vegetables, bulk grains, fresh meats, and fresh dairy products. Cooking Use low-heat cooking methods, such as baking, instead of high-heat cooking methods, such as deep frying. Cook using healthy oils, such as olive, canola, or sunflower oil. Avoid cooking with butter, cream, or high-fat meats. Meal planning Eat meals and snacks regularly, preferably at the same times every day. Avoid going long periods of time without eating. Eat foods that are high in fiber, such as fresh fruits, vegetables, beans, and whole grains. Eat 4-6 oz (112-168 g) of lean protein each day, such as lean meat, chicken, fish, eggs, or tofu. One ounce (oz) (28 g) of lean protein is equal to: 1 oz (28 g) of meat, chicken, or fish. 1 egg.  cup (62 g) of tofu. Eat some foods each day that contain healthy fats, such as avocado, nuts, seeds, and fish. What foods should I eat? Fruits Berries. Apples. Oranges. Peaches. Apricots. Plums. Grapes. Mangoes. Papayas. Pomegranates. Kiwi. Cherries. Vegetables Leafy greens, including lettuce, spinach, kale, chard, collard greens, mustard greens, and cabbage. Beets. Cauliflower. Broccoli. Carrots. Green beans. Tomatoes. Peppers. Onions. Cucumbers. Brussels sprouts. Grains Whole grains, such as whole-wheat or whole-grain bread, crackers, tortillas, cereal, and pasta. Unsweetened oatmeal. Quinoa. Brown or wild rice. Meats and other proteins Seafood. Poultry without skin. Lean cuts of poultry and beef. Tofu. Nuts. Seeds. Dairy Low-fat or fat-free dairy products such as milk,  yogurt, and cheese. The items listed above may not be a complete list of foods and beverages you can eat and drink. Contact a dietitian for more information. What foods should I avoid? Fruits Fruits canned with syrup. Vegetables Canned vegetables. Frozen vegetables with butter or cream sauce. Grains Refined white flour and flour products such as bread, pasta, snack foods, and cereals. Avoid all processed foods. Meats and other proteins Fatty cuts of meat. Poultry with skin. Breaded or fried meats. Processed meat. Avoid saturated fats. Dairy Full-fat yogurt, cheese, or milk. Beverages Sweetened drinks, such as soda or iced tea. The items listed above may not be a complete list of foods and beverages you should avoid. Contact a dietitian for more information. Questions to ask a health care provider Do I need to meet with a certified diabetes care and education specialist? Do I need to meet with a dietitian? What number can I call if I have questions? When are the best times to check my blood glucose? Where to find more information: American Diabetes Association: diabetes.org Academy of Nutrition and Dietetics: eatright.[employer] of Diabetes and Digestive and Kidney Diseases:  StageSync.si Association of Diabetes Care & Education Specialists: diabeteseducator.org Summary It is important to have healthy eating habits because your blood sugar (glucose) levels are greatly affected by what you eat and drink. It is important to use alcohol carefully. A healthy meal plan will help you manage your blood glucose and lower your risk of heart disease. Your health care provider may recommend that you work with a dietitian to make a meal plan that is best for you. This information is not intended to replace advice given to you by your health care provider. Make sure you discuss any questions you have with your health care provider. Document Revised: 04/11/2020 Document Reviewed:  04/12/2020 Elsevier Patient Education  2024 Elsevier Inc.     Edwina Barth, MD [facility]

## 2023-12-29 NOTE — Patient Instructions (Signed)

## 2023-12-29 NOTE — Assessment & Plan Note (Signed)
 Well-controlled hypertension Continues losartan 50 mg and metoprolol succinate 25 mg daily Uncontrolled diabetes with hemoglobin A1c of 7.4 Taking glipizide only once a day.  Advised to start glipizide 5 mg twice a day Recommend to restart weekly Ozempic Has developed lots of side effects to most other diabetic medications.  Mostly GI side effects. Cardiovascular risks associated with hypertension and diabetes discussed Diet and nutrition discussed

## 2023-12-29 NOTE — Assessment & Plan Note (Signed)
Diet and nutrition discussed Intolerant to statins Intolerant to Repatha Continues Zetia 10 mg daily

## 2023-12-29 NOTE — Assessment & Plan Note (Signed)
Stable.  No recent anginal episodes Exercises regularly Continues daily Plavix 75 mg

## 2023-12-29 NOTE — Assessment & Plan Note (Signed)
Stable and asymptomatic.  Murmur on exam.

## 2023-12-30 DIAGNOSIS — E7800 Pure hypercholesterolemia, unspecified: Secondary | ICD-10-CM | POA: Diagnosis not present

## 2023-12-31 LAB — LIPID PANEL
Chol/HDL Ratio: 4.1 ratio (ref 0.0–5.0)
Cholesterol, Total: 138 mg/dL (ref 100–199)
HDL: 34 mg/dL — ABNORMAL LOW (ref >39)
LDL Chol Calc (NIH): 67 mg/dL (ref 0–99)
Triglycerides: 224 mg/dL — ABNORMAL HIGH (ref 0–149)
VLDL Cholesterol Cal: 37 mg/dL (ref 5–40)

## 2024-01-01 ENCOUNTER — Other Ambulatory Visit: Payer: Self-pay | Admitting: Emergency Medicine

## 2024-01-01 DIAGNOSIS — R21 Rash and other nonspecific skin eruption: Secondary | ICD-10-CM

## 2024-01-01 NOTE — Telephone Encounter (Signed)
 Copied from CRM [id number]. Topic: Clinical - Medication Refill >> Jan 01, 2024 11:39 AM Arley Phenix D wrote: Most Recent Primary Care Visit:  Provider: Georgina Quint  Department: [facility]  Visit Type: OFFICE VISIT  Date: 12/29/2023  Medication: clotrimazole-betamethasone (LOTRISONE) cream  Has the patient contacted their pharmacy? Yes (Agent: If no, request that the patient contact the pharmacy for the refill. If patient does not wish to contact the pharmacy document the reason why and proceed with request.) (Agent: If yes, when and what did the pharmacy advise?)  Is this the correct pharmacy for this prescription? Yes If no, delete pharmacy and type the correct one.  This is the patient's preferred pharmacy:  [facility] [address] [place] - [address] [place] [address] Phone: [phone] Fax: [phone]   Has the prescription been filled recently? No  Is the patient out of the medication? Yes  Has the patient been seen for an appointment in the last year OR does the patient have an upcoming appointment? Yes  Can we respond through MyChart? Yes  Agent: Please be advised that Rx refills may take up to 3 business days. We ask that you follow-up with your pharmacy.

## 2024-01-02 MED ORDER — CLOTRIMAZOLE-BETAMETHASONE 1-0.05 % EX CREA: 1.0000 | TOPICAL_CREAM | Freq: Every day | CUTANEOUS | 0 refills | Status: AC

## 2024-01-05 ENCOUNTER — Encounter: Payer: Self-pay | Admitting: Cardiovascular Disease

## 2024-01-05 DIAGNOSIS — R5383 Other fatigue: Secondary | ICD-10-CM | POA: Diagnosis not present

## 2024-02-02 ENCOUNTER — Other Ambulatory Visit: Payer: Self-pay | Admitting: Emergency Medicine

## 2024-02-02 DIAGNOSIS — E1165 Type 2 diabetes mellitus with hyperglycemia: Secondary | ICD-10-CM

## 2024-03-01 ENCOUNTER — Telehealth: Payer: Self-pay | Admitting: Emergency Medicine

## 2024-03-01 ENCOUNTER — Other Ambulatory Visit: Payer: Self-pay | Admitting: Emergency Medicine

## 2024-03-01 ENCOUNTER — Other Ambulatory Visit: Payer: Self-pay | Admitting: Radiology

## 2024-03-01 DIAGNOSIS — E1165 Type 2 diabetes mellitus with hyperglycemia: Secondary | ICD-10-CM

## 2024-03-01 MED ORDER — ACCU-CHEK AVIVA PLUS W/DEVICE KIT: PACK | 0 refills | Status: DC

## 2024-03-01 MED ORDER — ACCU-CHEK AVIVA PLUS VI STRP: ORAL_STRIP | 12 refills | Status: AC

## 2024-03-01 NOTE — Telephone Encounter (Signed)
 Copied from CRM [id number]. Topic: Clinical - Prescription Issue >> Mar 01, 2024  8:45 AM Gregory Mathews wrote: Reason for CRM: Patient called states insurance needs his monitor and test strips changes so he can get them at no cost. Need to change to Accu check Aviva Plus. [facility] Can reach out with any questions. Thank You

## 2024-03-08 DIAGNOSIS — R5383 Other fatigue: Secondary | ICD-10-CM | POA: Diagnosis not present

## 2024-03-29 ENCOUNTER — Other Ambulatory Visit: Payer: Self-pay | Admitting: Emergency Medicine

## 2024-03-29 DIAGNOSIS — E1169 Type 2 diabetes mellitus with other specified complication: Secondary | ICD-10-CM

## 2024-03-29 DIAGNOSIS — E1159 Type 2 diabetes mellitus with other circulatory complications: Secondary | ICD-10-CM

## 2024-03-29 NOTE — Telephone Encounter (Unsigned)
 Copied from CRM [id number]. Topic: Clinical - Medication Refill >> Mar 29, 2024  4:05 PM Tiffini S wrote: Medication: Semaglutide ,0.5MG /DOS, (OZEMPIC , 0.5 MG/DOSE)   Has the patient contacted their pharmacy? No (Agent: If no, request that the patient contact the pharmacy for the refill. If patient does not wish to contact the pharmacy document the reason why and proceed with request.) (Agent: If yes, when and what did the pharmacy advise?)  This is the patient's preferred pharmacy:  [id number] [address], [place] - [address] [place] [address] Phone: [phone] Fax: [phone]  Is this the correct pharmacy for this prescription? Yes If no, delete pharmacy and type the correct one.   Has the prescription been filled recently? Yes  Is the patient out of the medication? Yes, patient to last injection today   Has the patient been seen for an appointment in the last year OR does the patient have an upcoming appointment? Yes  Can we respond through MyChart? No, patient is asking for a phone call   Agent: Please be advised that Rx refills may take up to 3 business days. We ask that you follow-up with your pharmacy.

## 2024-03-30 MED ORDER — OZEMPIC (0.25 OR 0.5 MG/DOSE) 2 MG/3ML ~~LOC~~ SOPN: 0.5000 mg | PEN_INJECTOR | SUBCUTANEOUS | 5 refills | Status: DC

## 2024-04-28 ENCOUNTER — Other Ambulatory Visit: Payer: Self-pay | Admitting: Emergency Medicine

## 2024-04-28 DIAGNOSIS — E1169 Type 2 diabetes mellitus with other specified complication: Secondary | ICD-10-CM

## 2024-05-14 ENCOUNTER — Telehealth: Payer: Self-pay

## 2024-05-14 NOTE — Telephone Encounter (Signed)
 Reached out to patient due to message drom Dre that patient stated that he was supposed to have a Nuclear stress test. 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

## 2024-05-17 NOTE — Telephone Encounter (Signed)
 Patient returned staff call.

## 2024-05-17 NOTE — Telephone Encounter (Signed)
 Patient wanted the nuclear due to his history and family history and due to the more in depth information from the scan. 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

## 2024-05-20 DIAGNOSIS — R450 Nervousness: Secondary | ICD-10-CM | POA: Diagnosis not present

## 2024-05-20 DIAGNOSIS — E119 Type 2 diabetes mellitus without complications: Secondary | ICD-10-CM | POA: Diagnosis not present

## 2024-05-20 DIAGNOSIS — E785 Hyperlipidemia, unspecified: Secondary | ICD-10-CM | POA: Diagnosis not present

## 2024-05-20 DIAGNOSIS — R454 Irritability and anger: Secondary | ICD-10-CM | POA: Diagnosis not present

## 2024-05-20 DIAGNOSIS — M2550 Pain in unspecified joint: Secondary | ICD-10-CM | POA: Diagnosis not present

## 2024-05-20 DIAGNOSIS — M6281 Muscle weakness (generalized): Secondary | ICD-10-CM | POA: Diagnosis not present

## 2024-05-20 DIAGNOSIS — R5382 Chronic fatigue, unspecified: Secondary | ICD-10-CM | POA: Diagnosis not present

## 2024-05-20 DIAGNOSIS — I2510 Atherosclerotic heart disease of native coronary artery without angina pectoris: Secondary | ICD-10-CM | POA: Diagnosis not present

## 2024-05-20 NOTE — Telephone Encounter (Signed)
 Talked with Gregory West NP patient is going to need the echo to monitor his Aortic stenosis. If patient needs nuclear for DOT testing patient will need an appointment in office to discuss with patient. Please scheduled patient for f/u post echo for the discussion of possible nuclear testing. If patient need DOT done before echo is complete schedule next available with Gregory West NP.

## 2024-06-18 DIAGNOSIS — R5382 Chronic fatigue, unspecified: Secondary | ICD-10-CM | POA: Diagnosis not present

## 2024-06-21 ENCOUNTER — Ambulatory Visit (HOSPITAL_COMMUNITY)
Admission: RE | Admit: 2024-06-21 | Discharge: 2024-06-21 | Disposition: A | Discharge status: Home / Self Care | Source: Ambulatory Visit | Attending: Cardiology | Admitting: Cardiology

## 2024-06-21 DIAGNOSIS — I350 Nonrheumatic aortic (valve) stenosis: Secondary | ICD-10-CM | POA: Diagnosis not present

## 2024-06-22 LAB — ECHOCARDIOGRAM COMPLETE
AR max vel: 0.74 cm2
AV Area VTI: 0.73 cm2
AV Area mean vel: 0.71 cm2
AV Mean grad: 24.4 mmHg
AV Peak grad: 42.3 mmHg
Ao pk vel: 3.25 m/s
Area-P 1/2: 3.42 cm2
S' Lateral: 2.9 cm

## 2024-06-23 ENCOUNTER — Ambulatory Visit: Payer: Self-pay | Admitting: Cardiology

## 2024-06-23 DIAGNOSIS — R450 Nervousness: Secondary | ICD-10-CM | POA: Diagnosis not present

## 2024-06-23 DIAGNOSIS — E119 Type 2 diabetes mellitus without complications: Secondary | ICD-10-CM | POA: Diagnosis not present

## 2024-06-23 DIAGNOSIS — E785 Hyperlipidemia, unspecified: Secondary | ICD-10-CM | POA: Diagnosis not present

## 2024-06-28 ENCOUNTER — Ambulatory Visit: Payer: Self-pay | Admitting: Emergency Medicine

## 2024-06-28 ENCOUNTER — Encounter: Payer: Self-pay | Admitting: Emergency Medicine

## 2024-06-28 ENCOUNTER — Ambulatory Visit: Admitting: Emergency Medicine

## 2024-06-28 VITALS — BP 110/62 | HR 67 | Temp 97.8°F | Ht 70.0 in | Wt 210.0 lb

## 2024-06-28 DIAGNOSIS — Z7984 Long term (current) use of oral hypoglycemic drugs: Secondary | ICD-10-CM

## 2024-06-28 DIAGNOSIS — Z7985 Long-term (current) use of injectable non-insulin antidiabetic drugs: Secondary | ICD-10-CM

## 2024-06-28 DIAGNOSIS — E1165 Type 2 diabetes mellitus with hyperglycemia: Secondary | ICD-10-CM | POA: Diagnosis not present

## 2024-06-28 DIAGNOSIS — I152 Hypertension secondary to endocrine disorders: Secondary | ICD-10-CM | POA: Diagnosis not present

## 2024-06-28 DIAGNOSIS — E785 Hyperlipidemia, unspecified: Secondary | ICD-10-CM | POA: Diagnosis not present

## 2024-06-28 DIAGNOSIS — I25119 Atherosclerotic heart disease of native coronary artery with unspecified angina pectoris: Secondary | ICD-10-CM | POA: Diagnosis not present

## 2024-06-28 DIAGNOSIS — E1169 Type 2 diabetes mellitus with other specified complication: Secondary | ICD-10-CM | POA: Diagnosis not present

## 2024-06-28 DIAGNOSIS — Z125 Encounter for screening for malignant neoplasm of prostate: Secondary | ICD-10-CM | POA: Diagnosis not present

## 2024-06-28 DIAGNOSIS — E1159 Type 2 diabetes mellitus with other circulatory complications: Secondary | ICD-10-CM | POA: Diagnosis not present

## 2024-06-28 DIAGNOSIS — I350 Nonrheumatic aortic (valve) stenosis: Secondary | ICD-10-CM

## 2024-06-28 DIAGNOSIS — Z23 Encounter for immunization: Secondary | ICD-10-CM

## 2024-06-28 DIAGNOSIS — I700 Atherosclerosis of aorta: Secondary | ICD-10-CM

## 2024-06-28 DIAGNOSIS — R399 Unspecified symptoms and signs involving the genitourinary system: Secondary | ICD-10-CM

## 2024-06-28 LAB — COMPREHENSIVE METABOLIC PANEL WITH GFR
ALT: 22 U/L (ref 0–53)
AST: 20 U/L (ref 0–37)
Albumin: 4.6 g/dL (ref 3.5–5.2)
Alkaline Phosphatase: 49 U/L (ref 39–117)
BUN: 15 mg/dL (ref 6–23)
CO2: 26 meq/L (ref 19–32)
Calcium: 10.5 mg/dL (ref 8.4–10.5)
Chloride: 103 meq/L (ref 96–112)
Creatinine, Ser: 0.8 mg/dL (ref 0.40–1.50)
GFR: 86 mL/min (ref >60.00)
Glucose, Bld: 136 mg/dL — ABNORMAL HIGH (ref 70–99)
Potassium: 4.5 meq/L (ref 3.5–5.1)
Sodium: 141 meq/L (ref 135–145)
Total Bilirubin: 0.6 mg/dL (ref 0.2–1.2)
Total Protein: 7.1 g/dL (ref 6.0–8.3)

## 2024-06-28 LAB — CBC WITH DIFFERENTIAL/PLATELET
Basophils Absolute: 0.1 K/uL (ref 0.0–0.1)
Basophils Relative: 1.1 % (ref 0.0–3.0)
Eosinophils Absolute: 0.2 K/uL (ref 0.0–0.7)
Eosinophils Relative: 3 % (ref 0.0–5.0)
HCT: 44.4 % (ref 39.0–52.0)
Hemoglobin: 14.9 g/dL (ref 13.0–17.0)
Lymphocytes Relative: 21.5 % (ref 12.0–46.0)
Lymphs Abs: 1.6 K/uL (ref 0.7–4.0)
MCHC: 33.7 g/dL (ref 30.0–36.0)
MCV: 91.4 fl (ref 78.0–100.0)
Monocytes Absolute: 0.4 K/uL (ref 0.1–1.0)
Monocytes Relative: 5.9 % (ref 3.0–12.0)
Neutro Abs: 5 K/uL (ref 1.4–7.7)
Neutrophils Relative %: 68.5 % (ref 43.0–77.0)
Platelets: 203 K/uL (ref 150.0–400.0)
RBC: 4.85 Mil/uL (ref 4.22–5.81)
RDW: 13.8 % (ref 11.5–15.5)
WBC: 7.3 K/uL (ref 4.0–10.5)

## 2024-06-28 LAB — POCT GLYCOSYLATED HEMOGLOBIN (HGB A1C): Hemoglobin A1C: 7.2 % — AB (ref 4.0–5.6)

## 2024-06-28 LAB — MICROALBUMIN / CREATININE URINE RATIO
Creatinine,U: 117.4 mg/dL
Microalb Creat Ratio: 7.1 mg/g (ref 0.0–30.0)
Microalb, Ur: 0.8 mg/dL (ref 0.0–1.9)

## 2024-06-28 LAB — LIPID PANEL
Cholesterol: 139 mg/dL (ref 0–200)
HDL: 36.5 mg/dL — ABNORMAL LOW (ref >39.00)
LDL Cholesterol: 84 mg/dL (ref 0–99)
NonHDL: 102.2
Total CHOL/HDL Ratio: 4
Triglycerides: 93 mg/dL (ref 0.0–149.0)
VLDL: 18.6 mg/dL (ref 0.0–40.0)

## 2024-06-28 LAB — PSA: PSA: 1.25 ng/mL (ref 0.10–4.00)

## 2024-06-28 NOTE — Patient Instructions (Signed)
 Health Maintenance After Age 76 After age 27, you are at a higher risk for certain long-term diseases and infections as well as injuries from falls. Falls are a major cause of broken bones and head injuries in people who are older than age 73. Getting regular preventive care can help to keep you healthy and well. Preventive care includes getting regular testing and making lifestyle changes as recommended by your health care provider. Talk with your health care provider about: Which screenings and tests you should have. A screening is a test that checks for a disease when you have no symptoms. A diet and exercise plan that is right for you. What should I know about screenings and tests to prevent falls? Screening and testing are the best ways to find a health problem early. Early diagnosis and treatment give you the best chance of managing medical conditions that are common after age 90. Certain conditions and lifestyle choices may make you more likely to have a fall. Your health care provider may recommend: Regular vision checks. Poor vision and conditions such as cataracts can make you more likely to have a fall. If you wear glasses, make sure to get your prescription updated if your vision changes. Medicine review. Work with your health care provider to regularly review all of the medicines you are taking, including over-the-counter medicines. Ask your health care provider about any side effects that may make you more likely to have a fall. Tell your health care provider if any medicines that you take make you feel dizzy or sleepy. Strength and balance checks. Your health care provider may recommend certain tests to check your strength and balance while standing, walking, or changing positions. Foot health exam. Foot pain and numbness, as well as not wearing proper footwear, can make you more likely to have a fall. Screenings, including: Osteoporosis screening. Osteoporosis is a condition that causes  the bones to get weaker and break more easily. Blood pressure screening. Blood pressure changes and medicines to control blood pressure can make you feel dizzy. Depression screening. You may be more likely to have a fall if you have a fear of falling, feel depressed, or feel unable to do activities that you used to do. Alcohol  use screening. Using too much alcohol  can affect your balance and may make you more likely to have a fall. Follow these instructions at home: Lifestyle Do not drink alcohol  if: Your health care provider tells you not to drink. If you drink alcohol : Limit how much you have to: 0-1 drink a day for women. 0-2 drinks a day for men. Know how much alcohol  is in your drink. In the [place] one drink equals one 12 oz bottle of beer (355 mL), one 5 oz glass of wine (148 mL), or one 1 oz glass of hard liquor (44 mL). Do not use any products that contain nicotine or tobacco. These products include cigarettes, chewing tobacco, and vaping devices, such as e-cigarettes. If you need help quitting, ask your health care provider. Activity  Follow a regular exercise program to stay fit. This will help you maintain your balance. Ask your health care provider what types of exercise are appropriate for you. If you need a cane or walker, use it as recommended by your health care provider. Wear supportive shoes that have nonskid soles. Safety  Remove any tripping hazards, such as rugs, cords, and clutter. Install safety equipment such as grab bars in bathrooms and safety rails on stairs. Keep rooms and walkways  well-lit. General instructions Talk with your health care provider about your risks for falling. Tell your health care provider if: You fall. Be sure to tell your health care provider about all falls, even ones that seem minor. You feel dizzy, tiredness (fatigue), or off-balance. Take over-the-counter and prescription medicines only as told by your health care provider. These include  supplements. Eat a healthy diet and maintain a healthy weight. A healthy diet includes low-fat dairy products, low-fat (lean) meats, and fiber from whole grains, beans, and lots of fruits and vegetables. Stay current with your vaccines. Schedule regular health, dental, and eye exams. Summary Having a healthy lifestyle and getting preventive care can help to protect your health and wellness after age 15. Screening and testing are the best way to find a health problem early and help you avoid having a fall. Early diagnosis and treatment give you the best chance for managing medical conditions that are more common for people who are older than age 42. Falls are a major cause of broken bones and head injuries in people who are older than age 64. Take precautions to prevent a fall at home. Work with your health care provider to learn what changes you can make to improve your health and wellness and to prevent falls. This information is not intended to replace advice given to you by your health care provider. Make sure you discuss any questions you have with your health care provider. Document Revised: 01/29/2021 Document Reviewed: 01/29/2021 Elsevier Patient Education  2024 [employer].

## 2024-06-28 NOTE — Assessment & Plan Note (Signed)
 Stable.  No recent anginal episodes Exercises regularly Continues daily Plavix  75 mg Scheduled to see cardiologist tomorrow

## 2024-06-28 NOTE — Progress Notes (Signed)
 Gregory Mathews. 78 y.o.   Chief Complaint  Patient presents with   Follow-up    Patient here for 6 month f/u for HTN / DM     HISTORY OF PRESENT ILLNESS: This is a 78 y.o. male here for 2-month follow-up of chronic medical conditions including hypertension and diabetes Overall doing well. Has no complaints or medical concerns today. Lab Results  Component Value Date   HGBA1C 7.4 (A) 12/29/2023   BP Readings from Last 3 Encounters:  06/28/24 110/62  12/29/23 (!) 140/78  12/26/23 (!) 142/64   Wt Readings from Last 3 Encounters:  06/28/24 210 lb (95.3 kg)  12/29/23 217 lb (98.4 kg)  12/26/23 216 lb 12.8 oz (98.3 kg)     HPI   Prior to Admission medications   Medication Sig Start Date End Date Taking? Authorizing Provider  ACCU-CHEK AVIVA PLUS test strip USE TO MONITOR BLOOD SUGARS DAILY 03/01/24  Yes Deiondre Harrower, Emil Schanz, MD  blood glucose meter kit and supplies Use to test blood sugar daily. Dx: E11.9 10/11/15  Yes Gretta Ozell CROME, PA-C  clopidogrel  (PLAVIX ) 75 MG tablet TAKE 1 TABLET BY MOUTH EVERY DAY 12/22/23  Yes Monge, Damien BROCKS, NP  clotrimazole -betamethasone  (LOTRISONE ) cream Apply 1 Application topically daily. 01/02/24  Yes Purcell Emil Schanz, MD  ezetimibe  (ZETIA ) 10 MG tablet TAKE 1 TABLET BY MOUTH EVERY DAY 04/28/24  Yes Chadrick Sprinkle, Emil Schanz, MD  glipiZIDE  (GLUCOTROL ) 5 MG tablet TAKE 1 TABLET (5 MG TOTAL) BY MOUTH TWICE A DAY BEFORE MEALS 02/02/24  Yes Scotlynn Noyes, Emil Schanz, MD  glucose blood (ACCU-CHEK AVIVA PLUS) test strip Use as instructed 03/01/24  Yes Prachi Oftedahl, Emil Schanz, MD  Lancets MISC Use to test blood sugar daily. Dx code :E11.9 07/08/19  Yes Purcell Emil Schanz, MD  losartan  (COZAAR ) 50 MG tablet TAKE 1 TABLET BY MOUTH EVERY DAY 12/20/23  Yes Li Fragoso, Emil Schanz, MD  metoprolol  succinate (TOPROL -XL) 25 MG 24 hr tablet TAKE 1 TABLET (25 MG TOTAL) BY MOUTH DAILY. 12/18/23  Yes Monge, Damien BROCKS, NP  Multiple Vitamin (MULTIVITAMIN) tablet Take 1 tablet  by mouth daily.   Yes [provider]  Semaglutide ,0.25 or 0.5MG /DOS, (OZEMPIC , 0.25 OR 0.5 MG/DOSE,) 2 MG/3ML SOPN Inject 0.5 mg into the skin once a week. Increase to 0.5 mg weekly after 2 weeks if side effects tolerated 03/30/24  Yes Keilly Fatula, Emil Schanz, MD  tadalafil  (CIALIS ) 10 MG tablet Take 1 tablet (10 mg total) by mouth daily at 6 (six) AM. 06/24/23 06/28/24 Yes Jowell Bossi, Emil Schanz, MD  testosterone  cypionate (DEPOTESTOSTERONE CYPIONATE) 200 MG/ML injection Inject 0.75 mg into the muscle once a week. 10/25/23  Yes [provider]    Allergies  Allergen Reactions   Januvia  [Sitagliptin ] Other (See Comments)    Gi upset and kidney problems   Lisinopril  Cough   Repatha [Evolocumab] Other (See Comments)    Myalgias    Statins Other (See Comments)    Severe leg pain & fatigue -- tried at least 4 (even low dose)    Patient Active Problem List   Diagnosis Date Noted   Lower urinary tract symptoms 06/24/2023   Bilateral hip pain 06/24/2023   Left lumbar radiculopathy 12/31/2022   CAD (coronary artery disease) 06/11/2022   Atherosclerosis of aorta 02/15/2021   Family history of colon cancer    Family history of malignant neoplasm of breast    Polyposis of colon    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   Clotting disorder 05/17/2020   Skin cancer,  basal cell 11/18/2019   Uncircumcised male 03/11/2018   Coronary artery disease involving native coronary artery of native heart with angina pectoris 11/04/2017   Abnormal findings on diagnostic imaging of cardiovascular system 11/04/2017   Agatston coronary artery calcium  score between 100 and 199 09/22/2017   Family history of early CAD 09/11/2017   Nonrheumatic aortic valve stenosis 09/11/2017   Metabolic syndrome 09/11/2017   Hypogonadism in male 08/14/2016   BMI 31.0-31.9,adult 03/13/2016   Hypertension associated with diabetes (HCC) 08/29/2015   Diverticulosis of colon without hemorrhage 08/01/2015   Statin intolerance  08/01/2015   Basal cell carcinoma of nose 08/23/2014   Seasonal allergies 01/26/2013   Type 2 diabetes mellitus with hyperglycemia, without long-term current use of insulin (HCC) 07/09/2012   Hyperlipidemia associated with type 2 diabetes mellitus (HCC) 07/09/2012   Erectile dysfunction 07/09/2012    Past Medical History:  Diagnosis Date   Allergy    Basal cell carcinoma 01/2013   Nasal and R forearm.  [facility]   CAD S/P percutaneous coronary angioplasty    2/19 PCI/DESx1 to mLAD, FFR 0.7 -Sierra DES 2.75 mm x 18 mm   Cataract    Diabetes mellitus without complication (HCC)    on PO Meds   Essential hypertension    Family history of colon cancer    Family history of malignant neoplasm of breast    Heart murmur    Hyperlipidemia    statin intolerant -- myalgias, fatigue (tried at least 4)   Polyposis of colon    Rosacea     Past Surgical History:  Procedure Laterality Date   basal cell removal     nose and wrist    BELPHAROPTOSIS REPAIR     CORONARY CT ANGIOGRAM  09/2017   Coronary Calcium  Score: 111. Coronary calcification noted in the LEFT MAIN (LM) and prox LAD.  LM < 50% calcified stenosis.  Mid LAD 50-75% plaque noted CT FFR --> positive at 0.80.  Recommend CARDIAC CATHETERIZATION   CORONARY PRESSURE/FFR STUDY N/A 11/12/2017   Procedure: INTRAVASCULAR PRESSURE WIRE/FFR STUDY;  Surgeon: Anner Alm ORN, MD;  Location: [facility] INVASIVE CV LAB;  Service: Cardiovascular;  Laterality: LAD -FFR 0.76   CORONARY STENT INTERVENTION N/A 11/12/2017   Procedure: CORONARY STENT INTERVENTION;  Surgeon: Anner Alm ORN, MD;  Location: [facility] INVASIVE CV LAB;  Service: Cardiovascular:  DES PCI:  STENT SIERRA 2.75 X 18 MM - Post intervention, there is a 0% residual stenosis.   ETT/GXT: Graded Exercise Tolerance Test  08/2015    Exercised for 9:01 min --> blunted blood pressure response no EKG changes.  No chest pain.  Low Risk   EYE SURGERY     Cataract surgery   eyelid surgery      LEFT HEART CATH AND CORONARY ANGIOGRAPHY N/A 11/12/2017   Procedure: LEFT HEART CATH AND CORONARY ANGIOGRAPHY;  Surgeon: Anner Alm ORN, MD;  Location: [facility] INVASIVE CV LAB: 70% P-M LAD (FFR 0.76).  40% mid LAD, 50% ostial ramus.  EF 55-60%.  Mildly elevated LVEDP.   SHOULDER OPEN ROTATOR CUFF REPAIR  1983   TRANSTHORACIC ECHOCARDIOGRAM  08/2017   EF 55-60%.  Mild aortic stenosis (mean gradient 11 mmH.)  GR 1 DD.  Mild LA dilation.    Social History   Socioeconomic History   Marital status: Married    Spouse name: Rock   Number of children: 4   Years of education: 20   Highest education level: Not on file  Occupational History  Occupation: bus Programme researcher, broadcasting/film/video: [employer]  Tobacco Use   Smoking status: Former    Current packs/day: 0.00    Average packs/day: 1 pack/day for 15.0 years (15.0 ttl pk-yrs)    Types: Cigarettes    Start date: 07/10/1967    Quit date: 07/09/1982    Years since quitting: 42.0   Smokeless tobacco: Never  Substance and Sexual Activity   Alcohol use: No   Drug use: No   Sexual activity: Yes    Partners: Female  Other Topics Concern   Not on file  Social History Narrative   Marital status: married x 46 years; happily married.   Lives: with wife       Children:  4 children; 4 grandchildren. Adult children all live locally.          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      Tobacco:  In past; smoked x 15 years; quit 35 years ago.      Alcohol: none      ADLs: independent with all ADLs.  Does not use assistant device with ambulation.      Living Will:  Has living will; desires FULL CODE; no prolonged measures.      Doctoral degree in Biblical Studies.          Diet: eats twice daily, eggs,sausage,and fruits for breakfast; chicken (fried or broil), occassional pizza, salads, steak and pork chops.        Exercise: cardiac rehab, transition to [facility] -  45 minutes walks and resistance 5x/week   Social Drivers of Health   Financial Resource Strain: Low Risk  (06/24/2022)   Overall Financial Resource Strain (CARDIA)    Difficulty of Paying Living Expenses: Not hard at all  Food Insecurity: No Food Insecurity (06/24/2022)   Hunger Vital Sign    Worried About Running Out of Food in the Last Year: Never true    Ran Out of Food in the Last Year: Never true  Transportation Needs: No Transportation Needs (06/24/2022)   PRAPARE - Administrator, Civil Service (Medical): No    Lack of Transportation (Non-Medical): No  Physical Activity: Sufficiently Active (06/24/2022)   Exercise Vital Sign    Days of Exercise per Week: 5 days    Minutes of Exercise per Session: 60 min  Stress: No Stress Concern Present (06/24/2022)   [employer] of Occupational Health - Occupational Stress Questionnaire    Feeling of Stress : Not at all  Social Connections: Socially Integrated (06/24/2022)   Social Connection and Isolation Panel    Frequency of Communication with Friends and Family: More than three times a week    Frequency of Social Gatherings with Friends and Family: More than three times a week    Attends Religious Services: More than 4 times per year    Active Member of [employer] or Organizations: Yes    Attends Banker Meetings: More than 4 times per year    Marital Status: Married  Catering manager Violence: Not At Risk (06/24/2022)   Humiliation, Afraid, Rape, and Kick questionnaire    Fear of Current or Ex-Partner: No    Emotionally Abused: No    Physically Abused: No    Sexually Abused: No    Family History  Problem Relation Age of Onset   Leukemia Mother 58       d. 31   Stroke Father 21  as a complication of CABG   Colon cancer Father 67   Coronary artery disease Father 23       - Sx was DOE - MV CAD   Peripheral Artery Disease Father        presumptive   Diabetes Sister    Sudden Cardiac Death Sister 75        presumed MI   Heart attack Brother 59       During throat surgery   Congestive Heart Failure Brother        Died @ 4 - ? CHF / ICM   Breast cancer Maternal Grandmother        dx in her 25s   Stroke Maternal Grandfather    Diabetes Brother 38       on lots of meds   Diabetes Brother    Diabetes Paternal Grandmother    Rheumatic fever Brother        childhood RF --> Valvular Dz - valve Sgx,  died young   Stomach cancer Neg Hx    Esophageal cancer Neg Hx      Review of Systems  Constitutional: Negative.  Negative for chills and fever.  HENT: Negative.  Negative for congestion and sore throat.   Respiratory: Negative.  Negative for cough and shortness of breath.   Cardiovascular: Negative.  Negative for chest pain and palpitations.  Gastrointestinal:  Negative for abdominal pain, diarrhea, nausea and vomiting.  Genitourinary: Negative.  Negative for dysuria and hematuria.  Skin: Negative.  Negative for rash.  Neurological: Negative.  Negative for dizziness and headaches.  All other systems reviewed and are negative.   Vitals:   06/28/24 0756  BP: 110/62  Pulse: 67  Temp: 97.8 F (36.6 C)  SpO2: 97%    Physical Exam Vitals reviewed.  Constitutional:      Appearance: Normal appearance.  HENT:     Head: Normocephalic.     Mouth/Throat:     Mouth: Mucous membranes are moist.     Pharynx: Oropharynx is clear.  Eyes:     Extraocular Movements: Extraocular movements intact.     Pupils: Pupils are equal, round, and reactive to light.  Cardiovascular:     Rate and Rhythm: Normal rate and regular rhythm.     Heart sounds: Murmur heard.  Pulmonary:     Effort: Pulmonary effort is normal.     Breath sounds: Normal breath sounds.  Musculoskeletal:     Cervical back: No tenderness.  Lymphadenopathy:     Cervical: No cervical adenopathy.  Skin:    General: Skin is warm and dry.     Capillary Refill: Capillary refill takes less than 2 seconds.  Neurological:      General: No focal deficit present.     Mental Status: He is alert and oriented to person, place, and time.  Psychiatric:        Mood and Affect: Mood normal.        Behavior: Behavior normal.    Results for orders placed or performed in visit on 06/28/24 (from the past 24 hours)  POCT HgB A1C     Status: Abnormal   Collection Time: 06/28/24  8:10 AM  Result Value Ref Range   Hemoglobin A1C 7.2 (A) 4.0 - 5.6 %   HbA1c POC (<> result, manual entry)     HbA1c, POC (prediabetic range)     HbA1c, POC (controlled diabetic range)       ASSESSMENT & PLAN: A total of  43 minutes was spent with the patient and counseling/coordination of care regarding preparing for this visit, review of most recent office visit notes, review of multiple chronic medical conditions and their management, review of all medications, review of most recent bloodwork results, review of health maintenance items, education on nutrition, prognosis, documentation, and need for follow up.   Problem List Items Addressed This Visit       Cardiovascular and Mediastinum   Hypertension associated with diabetes (HCC) - Primary (Chronic)   Well-controlled hypertension Continues losartan  50 mg and metoprolol  succinate 25 mg daily Uncontrolled diabetes with hemoglobin A1c of 7.2 Continue glipizide  5 mg twice a day Recommend to continue Ozempic  0.5 mg weekly Has developed lots of side effects to most other diabetic medications.  Mostly GI side effects. Cardiovascular risks associated with hypertension and diabetes discussed Diet and nutrition discussed      Relevant Orders   Microalbumin / creatinine urine ratio   Comprehensive metabolic panel with GFR   CBC with Differential/Platelet   Lipid panel   Nonrheumatic aortic valve stenosis   Stable and asymptomatic. Murmur on exam.       Coronary artery disease involving native coronary artery of native heart with angina pectoris   Stable.  No recent anginal  episodes Exercises regularly Continues daily Plavix  75 mg Scheduled to see cardiologist tomorrow      Atherosclerosis of aorta   Diet and nutrition discussed Intolerant to statins Intolerant to Repatha Continues Zetia  10 mg daily          Endocrine   Type 2 diabetes mellitus with hyperglycemia, without long-term current use of insulin (HCC) (Chronic)   Relevant Orders   POCT HgB A1C (Completed)   Hyperlipidemia associated with type 2 diabetes mellitus (HCC) (Chronic)   Uncontrolled diabetes with hemoglobin A1c of 7.2 Recommend to take glipizide  twice a day and start weekly Ozempic  Continue Zetia  10 mg daily Diet and nutrition discussed      Relevant Orders   Microalbumin / creatinine urine ratio   Comprehensive metabolic panel with GFR   CBC with Differential/Platelet   Lipid panel     Other   Lower urinary tract symptoms   Much improved Continues daily Cialis  10 mg      Other Visit Diagnoses       Need for vaccination       Relevant Orders   Flu vaccine HIGH DOSE PF(Fluzone Trivalent) (Completed)     Screening for prostate cancer       Relevant Orders   PSA     Hypercalcemia       Relevant Orders   PTH, Intact and Calcium          Patient Instructions  Health Maintenance After Age 88 After age 62, you are at a higher risk for certain long-term diseases and infections as well as injuries from falls. Falls are a major cause of broken bones and head injuries in people who are older than age 38. Getting regular preventive care can help to keep you healthy and well. Preventive care includes getting regular testing and making lifestyle changes as recommended by your health care provider. Talk with your health care provider about: Which screenings and tests you should have. A screening is a test that checks for a disease when you have no symptoms. A diet and exercise plan that is right for you. What should I know about screenings and tests to prevent  falls? Screening and testing are the best ways to find a  health problem early. Early diagnosis and treatment give you the best chance of managing medical conditions that are common after age 27. Certain conditions and lifestyle choices may make you more likely to have a fall. Your health care provider may recommend: Regular vision checks. Poor vision and conditions such as cataracts can make you more likely to have a fall. If you wear glasses, make sure to get your prescription updated if your vision changes. Medicine review. Work with your health care provider to regularly review all of the medicines you are taking, including over-the-counter medicines. Ask your health care provider about any side effects that may make you more likely to have a fall. Tell your health care provider if any medicines that you take make you feel dizzy or sleepy. Strength and balance checks. Your health care provider may recommend certain tests to check your strength and balance while standing, walking, or changing positions. Foot health exam. Foot pain and numbness, as well as not wearing proper footwear, can make you more likely to have a fall. Screenings, including: Osteoporosis screening. Osteoporosis is a condition that causes the bones to get weaker and break more easily. Blood pressure screening. Blood pressure changes and medicines to control blood pressure can make you feel dizzy. Depression screening. You may be more likely to have a fall if you have a fear of falling, feel depressed, or feel unable to do activities that you used to do. Alcohol use screening. Using too much alcohol can affect your balance and may make you more likely to have a fall. Follow these instructions at home: Lifestyle Do not drink alcohol if: Your health care provider tells you not to drink. If you drink alcohol: Limit how much you have to: 0-1 drink a day for women. 0-2 drinks a day for men. Know how much alcohol is in your drink.  In the [place] one drink equals one 12 oz bottle of beer (355 mL), one 5 oz glass of wine (148 mL), or one 1 oz glass of hard liquor (44 mL). Do not use any products that contain nicotine or tobacco. These products include cigarettes, chewing tobacco, and vaping devices, such as e-cigarettes. If you need help quitting, ask your health care provider. Activity  Follow a regular exercise program to stay fit. This will help you maintain your balance. Ask your health care provider what types of exercise are appropriate for you. If you need a cane or walker, use it as recommended by your health care provider. Wear supportive shoes that have nonskid soles. Safety  Remove any tripping hazards, such as rugs, cords, and clutter. Install safety equipment such as grab bars in bathrooms and safety rails on stairs. Keep rooms and walkways well-lit. General instructions Talk with your health care provider about your risks for falling. Tell your health care provider if: You fall. Be sure to tell your health care provider about all falls, even ones that seem minor. You feel dizzy, tiredness (fatigue), or off-balance. Take over-the-counter and prescription medicines only as told by your health care provider. These include supplements. Eat a healthy diet and maintain a healthy weight. A healthy diet includes low-fat dairy products, low-fat (lean) meats, and fiber from whole grains, beans, and lots of fruits and vegetables. Stay current with your vaccines. Schedule regular health, dental, and eye exams. Summary Having a healthy lifestyle and getting preventive care can help to protect your health and wellness after age 86. Screening and testing are the best way to find a  health problem early and help you avoid having a fall. Early diagnosis and treatment give you the best chance for managing medical conditions that are more common for people who are older than age 28. Falls are a major cause of broken bones and  head injuries in people who are older than age 61. Take precautions to prevent a fall at home. Work with your health care provider to learn what changes you can make to improve your health and wellness and to prevent falls. This information is not intended to replace advice given to you by your health care provider. Make sure you discuss any questions you have with your health care provider. Document Revised: 01/29/2021 Document Reviewed: 01/29/2021 Elsevier Patient Education  2024 Elsevier Inc.     Emil Schaumann, MD [facility]

## 2024-06-28 NOTE — Assessment & Plan Note (Signed)
Stable and asymptomatic.  Murmur on exam.

## 2024-06-28 NOTE — Assessment & Plan Note (Signed)
Diet and nutrition discussed Intolerant to statins Intolerant to Repatha Continues Zetia 10 mg daily

## 2024-06-28 NOTE — Assessment & Plan Note (Signed)
 Well-controlled hypertension Continues losartan  50 mg and metoprolol  succinate 25 mg daily Uncontrolled diabetes with hemoglobin A1c of 7.2 Continue glipizide  5 mg twice a day Recommend to continue Ozempic  0.5 mg weekly Has developed lots of side effects to most other diabetic medications.  Mostly GI side effects. Cardiovascular risks associated with hypertension and diabetes discussed Diet and nutrition discussed

## 2024-06-28 NOTE — Assessment & Plan Note (Signed)
 Much improved Continues daily Cialis  10 mg

## 2024-06-28 NOTE — Assessment & Plan Note (Signed)
 Uncontrolled diabetes with hemoglobin A1c of 7.2 Recommend to take glipizide  twice a day and start weekly Ozempic  Continue Zetia  10 mg daily Diet and nutrition discussed

## 2024-06-29 ENCOUNTER — Ambulatory Visit: Attending: Cardiology | Admitting: Cardiology

## 2024-06-29 ENCOUNTER — Encounter: Payer: Self-pay | Admitting: Cardiology

## 2024-06-29 VITALS — BP 122/64 | HR 83 | Ht 70.0 in | Wt 213.0 lb

## 2024-06-29 DIAGNOSIS — I350 Nonrheumatic aortic (valve) stenosis: Secondary | ICD-10-CM | POA: Diagnosis not present

## 2024-06-29 DIAGNOSIS — Z955 Presence of coronary angioplasty implant and graft: Secondary | ICD-10-CM

## 2024-06-29 DIAGNOSIS — E1165 Type 2 diabetes mellitus with hyperglycemia: Secondary | ICD-10-CM | POA: Diagnosis not present

## 2024-06-29 DIAGNOSIS — I10 Essential (primary) hypertension: Secondary | ICD-10-CM | POA: Diagnosis not present

## 2024-06-29 DIAGNOSIS — I2510 Atherosclerotic heart disease of native coronary artery without angina pectoris: Secondary | ICD-10-CM

## 2024-06-29 LAB — PTH, INTACT AND CALCIUM
Calcium: 10.3 mg/dL (ref 8.6–10.3)
PTH: 26 pg/mL (ref 16–77)

## 2024-06-29 NOTE — Patient Instructions (Addendum)
 Medication Instructions:  Your physician recommends that you continue on your current medications as directed. Please refer to the Current Medication list given to you today.  *If you need a refill on your cardiac medications before your next appointment, please call your pharmacy*  Lab Work: NONE If you have labs (blood work) drawn today and your tests are completely normal, you will receive your results only by: MyChart Message (if you have MyChart) OR A paper copy in the mail If you have any lab test that is abnormal or we need to change your treatment, we will call you to review the results.  Testing/Procedures: Echocardiogram in 6 months  Exercise Myoview  Stress Test in November  Follow-Up: At [facility], you and your health needs are our priority.  As part of our continuing mission to provide you with exceptional heart care, our providers are all part of one team.  This team includes your primary Cardiologist (physician) and Advanced Practice Providers or APPs (Physician Assistants and Nurse Practitioners) who all work together to provide you with the care you need, when you need it.  Your next appointment:   6 months  Provider:   Anner, MD   We recommend signing up for the patient portal called MyChart.  Sign up information is provided on this After Visit Summary.  MyChart is used to connect with patients for Virtual Visits (Telemedicine).  Patients are able to view lab/test results, encounter notes, upcoming appointments, etc.  Non-urgent messages can be sent to your provider as well.   To learn more about what you can do with MyChart, go to [email or web address].

## 2024-06-29 NOTE — Progress Notes (Signed)
 Cardiology Office Note    Date:  06/29/2024  ID:  Gregory Mathews., DOB 03/04/1946, MRN [id number] PCP:  Purcell Emil Schanz, MD  Cardiologist:  Alm Clay, MD  Electrophysiologist:  None   Chief Complaint: Follow up for CAD   History of Present Illness: .    Gregory Mathews. is a 78 y.o. male with visit-pertinent history of CAD s/p DES to LAD in 2019, mild aortic stenosis, hypertension, hyperlipidemia and type 2 diabetes.  He presents today for follow-up related to CAD.   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  He has a history of statin intolerance, as well as history of intolerance to Praluent  and Repatha.  At office visit in 09/2021 he was noted to have bilateral carotid bruits, carotid ultrasound showed no evidence of carotid artery stenosis.   He was last seen in clinic on 11/27/2022 for follow-up, since last visit he remained stable from a cardiac standpoint.  He denied any can symptoms concerning for angina.  He underwent echocardiogram and MPI for DOT clearance.  Echocardiogram on 12/30/2022 indicated LVEF of 60 to 65%, LV with normal function, no RWMA, grade 1 diastolic dysfunction, he had mild to moderate aortic valve stenosis.  His MPI on 12/31/2022 was normal and low risk noted to have good exercise capacity.   He was seen by Dr. Mona in the lipid clinic on 01/16/2023, patient had previously been approved to start on Leqvio  but did not for concerns for side effects.  At visit with Dr. Mona he agreed to start on Leqvio .  Patient was seen in clinic on 09/05/2023 for follow-up.  Patient reported that he was doing very well, report he was walking on treadmill regularly and lifting weights for at least 30 minutes multiple times a week.  Echocardiogram on 12/09/2023 indicated LVEF 55 to 60%, no RWMA, diastolic parameters were consistent with grade 2 diastolic dysfunction, elevated left atrial pressure, RV systolic function and size was normal, LA was mildly dilated,  trivial mitral valve regurgitation with no evidence of stenosis, there is moderate to severe aortic valve stenosis.  Reviewed with Dr. Clay, as patient was asymptomatic plan to continue echocardiogram every 6 months.  Patient was seen in clinic on 4//25 by Dr. Mona, he had a single injection of Leqvio  and had severe left-sided leg pain.  It was noted that with few options for lipid treatment he can follow-up with Dr. Mona as needed.  Echocardiogram on 06/21/2024 indicated LVEF 55 to 60%, no RWMA, mild concentric LVH, grade 1 diastolic dysfunction, RV systolic function and size was normal, LA was mildly dilated, no evidence of mitral regurgitation or stenosis, aortic valve regurgitation was not visualized, moderate to severe aortic valve stenosis was noted.  Today he presents for follow-up.  He reports that he has been doing well overall. He denies chest pain, shortness of breath, lower extremity edema, orthponea or pnd. [facility], tolerates very well.  He denies any palpitations, presyncope or syncope.  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  Patient denies any cardiac concerns or complaints today.  Reviewed patient's echocardiogram, all questions answered.  Labwork independently reviewed: 06/28/2024: Sodium 141, potassium 4.5, creatinine 0.80, AST 20, ALT 22, hemoglobin 14.9, hematocrit 44.4 ROS: .   Today he denies chest pain, shortness of breath, lower extremity edema, fatigue, palpitations, melena, hematuria, hemoptysis, diaphoresis, weakness, presyncope, syncope, orthopnea, and PND.  All other systems are reviewed and otherwise negative.  Studies Reviewed: SABRA   EKG:  EKG is ordered today, personally reviewed, demonstrating  EKG Interpretation Date/Time:  Tuesday June 29 2024 15:42:55 EDT Ventricular Rate:  73 PR Interval:  188 QRS Duration:  90 QT Interval:  356 QTC Calculation: 392 R Axis:   -39  Text Interpretation: Normal sinus  rhythm with sinus arrhythmia Left axis deviation Left ventricular hypertrophy with repolarization abnormality ( R in aVL ) When compared with ECG of 05-Sep-2023 11:20, No significant change was found Confirmed by Eugen Jeansonne [phone]) on 06/29/2024 7:04:55 PM   CV Studies: Cardiac studies reviewed are outlined and summarized above. Otherwise please see EMR for full report. Cardiac Studies & Procedures   ______________________________________________________________________________________________ CARDIAC CATHETERIZATION  CARDIAC CATHETERIZATION 11/12/2017  Conclusion Images from the original result were not included.   Prox LAD lesion is 70% stenosed. FFR 0.76: CULPRIT LESION  A drug-eluting stent was successfully placed using a STENT SIERRA 2.75 X 18 MM.  Post intervention, there is a 0% residual stenosis.  Mid LAD lesion is 40% stenosed well beyond the stent.  Ost Ramus lesion is 50% stenosed.  The left ventricular systolic function is normal. The left ventricular ejection fraction is 55-65% by visual estimate.  LV end diastolic pressure is mildly elevated.  As an expected from coronary CTA, FFR positive proximal LAD lesion (although it did not appear angiographically significant, was FFR positive) treated with a DES stent Normal LVEF with mildly elevated EDP.   Plan: Same day discharge after bed rest.  DAPT for minimum 6 months, can stop aspirin  at 6 months.  We will try to start rosuvastatin  at 1 tab every other day.  We will need to reassess in follow-up.  Discharge on low-dose beta-blocker  He will follow-up with me as scheduled.   Alm Clay, M.D., M.S. Interventional Cardiologist  Pager # [phone] Phone # [phone] [address] [place], [place] [address]  Findings Coronary Findings Diagnostic  Dominance: Right  Left Main Vessel is large.  Left Anterior Descending Prox LAD lesion is 70% stenosed. The lesion is located proximal to the  major branch, segmental and tubular. Pressure wire/FFR was performed on the lesion. FFR: 0.76. With the EBU 3.5 guide catheter already in place, Prowater wire was advanced beyond the lesion.  Micronavvus FFR catheter was then advanced beyond the lesion.  Baseline FFR was 0.92 Adenosine  infused at 140 mcg/KG/min for 90 out of the intended 120 seconds.  It was stopped when the FFR went down to 0.76. ->  With physiologically significant lesion, we proceeded with PCI. Mid LAD lesion is 40% stenosed. The lesion is located at the bend.  First Diagonal Branch Vessel is small in size.  Second Diagonal Branch Vessel is moderate in size.  Third Diagonal Branch Vessel is small in size.  Ramus Intermedius Vessel is small. Vessel is angiographically normal. Ost Ramus lesion is 50% stenosed.  Lateral Ramus Intermedius Vessel is angiographically normal.  Left Circumflex Vessel is large.  First Obtuse Marginal Branch Vessel is small in size. Vessel is angiographically normal.  Second Obtuse Marginal Branch Vessel is small in size.  Right Coronary Artery Vessel is large. Vessel is angiographically normal.  Acute Marginal Branch Vessel is small in size.  Right Posterior Descending Artery Vessel is moderate in size.  Inferior Septal Vessel is small in size.  Right Posterior Atrioventricular Artery Vessel is small in size.  Intervention  Prox LAD lesion Stent Lesion length:  16 mm. Lesion crossed with guidewire using a WIRE ASAHI  PROWATER 180CM. Pre-stent angioplasty was performed using a BALLOON SAPPHIRE 2.5X15. Maximum pressure:  12 atm. Inflation time:  20 sec. A drug-eluting stent was successfully placed using a STENT SIERRA 2.75 X 18 MM. Maximum pressure: 20 atm. Inflation time: 30 sec. Minimum lumen area:  3.1 mm. Stent strut is well apposed. Post-stent angioplasty was performed using a BALLOON SAPPHIRE Capulin 3.0X10. Maximum pressure:  16 atm. Inflation time:  20  sec. Post-Intervention Lesion Assessment The intervention was successful. Pre-interventional TIMI flow is 3. Post-intervention TIMI flow is 3. Treated lesion length:  18 mm. No complications occurred at this lesion. There is a 0% residual stenosis post intervention.   STRESS TESTS  MYOCARDIAL PERFUSION IMAGING 12/31/2022  Interpretation Summary   The study is normal. The study is low risk.   Good exercise capacity, achieved 10.1 METS   Peak heart rate 133 bpm (91% max age predicted HR)   Hypertensive response to exercise, peak BP 215/75   No ST deviation was noted.   LV perfusion is normal. There is no evidence of ischemia. There is no evidence of infarction.   Left ventricular function is normal. End diastolic cavity size is normal. End systolic cavity size is normal.   Prior study available for comparison from 10/26/2021.  Fixed inferior perfusion defect with normal wall motion, consistent with artifact Low risk study   ECHOCARDIOGRAM  ECHOCARDIOGRAM COMPLETE 06/21/2024  Narrative ECHOCARDIOGRAM REPORT    Patient Name:   Brevan Luberto. Date of Exam: 06/21/2024 Medical Rec #:  [id number]          Height:       70.0 in Accession #:    [id number]         Weight:       217.0 lb Date of Birth:  December 24, 1945          BSA:          2.161 m Patient Age:    78 years           BP:           140/78 mmHg Patient Gender: M                  HR:           69 bpm. Exam Location:  [address]  Procedure: 2D Echo, Cardiac Doppler and Color Doppler (Both Spectral and Color Flow Doppler were utilized during procedure).  Indications:    I35.0 Aortic Stenosis  History:        Patient has prior history of Echocardiogram examinations, most recent 12/09/2023. CAD, STENT., Signs/Symptoms:Murmur; Risk Factors:Dyslipidemia, Hypertension and Diabetes.  Sonographer:    Carl Rodgers-Jones RDCS Sonographer#2:  Edsel Kerns, Student Sonographer Referring Phys: [phone] Keiron Iodice D  [place] Stegemann  IMPRESSIONS   1. Left ventricular ejection fraction, by estimation, is 55 to 60%. The left ventricle has normal function. The left ventricle has no regional wall motion abnormalities. There is mild concentric left ventricular hypertrophy. Left ventricular diastolic parameters are consistent with Grade I diastolic dysfunction (impaired relaxation). 2. Right ventricular systolic function is normal. The right ventricular size is normal. 3. Left atrial size was mildly dilated. 4. The mitral valve is normal in structure. No evidence of mitral valve regurgitation. No evidence of mitral stenosis. 5. The aortic valve is tricuspid. There is moderate calcification of the aortic valve. Aortic valve regurgitation is not visualized. Moderate to severe aortic valve stenosis. Aortic valve area, by VTI measures 0.73 cm. Aortic valve mean gradient measures  24.4 mmHg. Aortic valve Vmax measures 3.25 m/s. 6. The inferior vena cava is normal in size with greater than 50% respiratory variability, suggesting right atrial pressure of 3 mmHg.  FINDINGS Left Ventricle: Left ventricular ejection fraction, by estimation, is 55 to 60%. The left ventricle has normal function. The left ventricle has no regional wall motion abnormalities. The left ventricular internal cavity size was normal in size. There is mild concentric left ventricular hypertrophy. Left ventricular diastolic parameters are consistent with Grade I diastolic dysfunction (impaired relaxation).  Right Ventricle: The right ventricular size is normal. No increase in right ventricular wall thickness. Right ventricular systolic function is normal.  Left Atrium: Left atrial size was mildly dilated.  Right Atrium: Right atrial size was normal in size.  Pericardium: There is no evidence of pericardial effusion.  Mitral Valve: The mitral valve is normal in structure. No evidence of mitral valve regurgitation. No evidence of mitral valve  stenosis.  Tricuspid Valve: The tricuspid valve is normal in structure. Tricuspid valve regurgitation is trivial. No evidence of tricuspid stenosis.  The aortic valve is tricuspid. There is moderate calcification of the aortic valve. Aortic valve regurgitation is not visualized. Moderate to severe aortic stenosis is present. Pulmonic Valve: The pulmonic valve was normal in structure. Pulmonic valve regurgitation is trivial. No evidence of pulmonic stenosis.  Aorta: The aortic root is normal in size and structure.  Venous: The inferior vena cava is normal in size with greater than 50% respiratory variability, suggesting right atrial pressure of 3 mmHg.  IAS/Shunts: No atrial level shunt detected by color flow Doppler.   LEFT VENTRICLE PLAX 2D LVIDd:         4.30 cm   Diastology LVIDs:         2.90 cm   LV e' medial:    5.00 cm/s LV PW:         1.10 cm   LV E/e' medial:  20.6 LV IVS:        1.20 cm   LV e' lateral:   8.75 cm/s LVOT diam:     1.90 cm   LV E/e' lateral: 11.8 LV SV:         57 LV SV Index:   26 LVOT Area:     2.84 cm LV IVRT:       95 msec   RIGHT VENTRICLE             IVC RV Basal diam:  4.00 cm     IVC diam: 1.70 cm RV S prime:     14.35 cm/s TAPSE (M-mode): 2.5 cm      PULMONARY VEINS A Reversal Velocity: 29.10 cm/s Diastolic Velocity:  45.70 cm/s S/D Velocity:        0.90 Systolic Velocity:   42.75 cm/s  LEFT ATRIUM             Index        RIGHT ATRIUM           Index LA diam:        4.70 cm 2.17 cm/m   RA Area:     15.60 cm LA Vol (A2C):   47.0 ml 21.75 ml/m  RA Volume:   47.10 ml  21.80 ml/m LA Vol (A4C):   46.8 ml 21.66 ml/m LA Biplane Vol: 50.3 ml 23.28 ml/m AORTIC VALVE AV Area (Vmax):    0.74 cm AV Area (Vmean):   0.71 cm AV Area (VTI):     0.73 cm AV  Vmax:           325.00 cm/s AV Vmean:          228.000 cm/s AV VTI:            0.785 m AV Peak Grad:      42.2 mmHg AV Mean Grad:      24.4 mmHg LVOT Vmax:         85.25 cm/s LVOT  Vmean:        56.700 cm/s LVOT VTI:          0.201 m LVOT/AV VTI ratio: 0.26  AORTA Ao Root diam: 2.80 cm Ao Asc diam:  3.20 cm  MITRAL VALVE MV Area (PHT): 3.42 cm     SHUNTS MV Decel Time: 222 msec     Systemic VTI:  0.20 m MV E velocity: 103.00 cm/s  Systemic Diam: 1.90 cm MV A velocity: 111.00 cm/s MV E/A ratio:  0.93  Toribio Fuel MD Electronically signed by Toribio Fuel MD Signature Date/Time: 06/22/2024/10:22:56 AM    Final      CT SCANS  CT CARDIAC SCORING (SELF PAY ONLY) 04/29/2023  Addendum 05/07/2023 10:34 AM ADDENDUM REPORT: 05/07/2023 10:32  EXAM: OVER-READ INTERPRETATION  CT CHEST  The following report is an over-read performed by radiologist Dr. Andrea Gasman of [facility] on 05/07/2023. This over-read does not include interpretation of cardiac or coronary anatomy or pathology. The coronary calcium  score interpretation by the cardiologist is attached.  COMPARISON:  Cardiac CT 10/23/2017  FINDINGS: Vascular: Aortic atherosclerosis. The included aorta is normal in caliber.  Mediastinum/nodes: No adenopathy or mass. Unremarkable esophagus.  Lungs: No focal airspace disease. No pulmonary nodule. No pleural fluid. The included airways are patent.  Upper abdomen: No acute or unexpected findings.  Musculoskeletal: There are no acute or suspicious osseous abnormalities.  IMPRESSION: Aortic Atherosclerosis (ICD10-I70.0).   Electronically Signed By: Andrea Gasman M.D. On: 05/07/2023 10:32  Narrative CLINICAL DATA:  Cardiovascular Disease Risk stratification  EXAM: Coronary Calcium  Score  TECHNIQUE: A gated, non-contrast computed tomography scan of the heart was performed using 3mm slice thickness. Axial images were analyzed on a dedicated workstation. Calcium  scoring of the coronary arteries was performed using the Agatston method.  FINDINGS: Coronary Calcium  Score: Prior stent noted in the proximal  LAD. Coronary calcium  scoring is inaccurate in the setting of prior PCI.  Pericardium: Normal.  Ascending aorta: Normal caliber.  Non-cardiac: See separate report from [facility].  IMPRESSION: 1.  Coronary calcium  unable to be performed due to stent in the LAD.  2. Aggressive risk factor modification is recommended for secondary prevention in this patient with known cardiovascular disease.  RECOMMENDATIONS: Coronary artery calcium  (CAC) score is a strong predictor of incident coronary heart disease (CHD) and provides predictive information beyond traditional risk factors. CAC scoring is reasonable to use in the decision to withhold, postpone, or initiate statin therapy in intermediate-risk or selected borderline-risk asymptomatic adults (age 54-75 years and LDL-C >=70 to <190 mg/dL) who do not have diabetes or established atherosclerotic cardiovascular disease (ASCVD).* In intermediate-risk (10-year ASCVD risk >=7.5% to <20%) adults or selected borderline-risk (10-year ASCVD risk >=5% to <7.5%) adults in whom a CAC score is measured for the purpose of making a treatment decision the following recommendations have been made:  If CAC=0, it is reasonable to withhold statin therapy and reassess in 5 to 10 years, as long as higher risk conditions are absent (diabetes mellitus, family history of premature CHD in first degree relatives (  males <55 years; females <65 years), cigarette smoking, or LDL >=190 mg/dL).  If CAC is 1 to 99, it is reasonable to initiate statin therapy for patients >=37 years of age.  If CAC is >=100 or >=75th percentile, it is reasonable to initiate statin therapy at any age.  Cardiology referral should be considered for patients with CAC scores >=400 or >=75th percentile.  *2018 AHA/ACC/AACVPR/AAPA/ABC/ACPM/ADA/AGS/APhA/ASPC/NLA/PCNA Guideline on the Management of Blood Cholesterol: A Report of the American College of Cardiology/American Heart  Association Task Force on Clinical Practice Guidelines. J Am Coll Cardiol. 2019;73(24):3168-3209.  Darryle Decent, MD  Electronically Signed: By: Darryle Decent M.D. On: 04/30/2023 19:51   CT SCANS  CT CORONARY FRACTIONAL FLOW RESERVE DATA PREP 10/23/2017  Narrative CLINICAL DATA:  FFR- CT  EXAM: FFR-CT  TECHNIQUE: Heart Flow Analysis  FINDINGS: FFR-CT normal in RCA and circumflex.  Abnormal in mid LAD at .44  Patient will be referred for heart catheterization given morphologic lesions in proximal and mid LAD  Suggesting > 70% stenosis  IMPRESSION: Positive FFR CT in mid to distal LAD corresponding to area of soft plaque in mid LAD on CTA  Maude Emmer   Electronically Signed By: Maude Emmer M.D. On: 10/27/2017 12:25   CT CORONARY MORPH W/CTA COR W/SCORE 10/23/2017  Addendum 10/23/2017 12:05 PM ADDENDUM REPORT: 10/23/2017 12:02  CLINICAL DATA:  Chest pain  EXAM: Cardiac CTA  MEDICATIONS: Sub lingual nitro. 4mg  and lopressor  5mg   TECHNIQUE: The patient was scanned on a Siemens Force 192 slice scanner. Gantry rotation speed was 270 msecs. Collimation was .9mm. A 100 kV prospective scan was triggered in the descending thoracic aorta at 111 HU's with 5% padding centered around 78% of the R-R interval. Average HR during the scan was 54 bpm. The 3D data set was interpreted on a dedicated work station using MPR, MIP and VRT modes. A total of 80cc of contrast was used.  FINDINGS: Non-cardiac: See separate report from [facility]. No significant findings on limited lung and soft tissue windows.  Calcium  Score: Calcium  noted in LM and proximal LAD  Coronary Arteries: Right dominant with no anomalies  LM: Less than 50% calcified stenosis  LAD: 50-75% soft plaque in mid LAD  IM: Normal  D1: Normal  D2: Normal  Circumflex: Normal  OM1: Normal  OM2: Normal  RCA: Normal and dominant  PDA: Normal  PLA: Normal  IMPRESSION: 1.   Normal aortic root 3.6 cm  2.  Calcified AV and aortic atherosclerosis  3. CAD wit less than 50% calcific LM and 50-75% soft plaque in mid LAD study will be sent for FFR-CT  Maude Emmer   Electronically Signed By: Maude Emmer M.D. On: 10/23/2017 12:02  Narrative EXAM: OVER-READ INTERPRETATION  CT CHEST  The following report is an over-read performed by radiologist Dr. Toribio Aye of [facility] on 10/23/2017. This over-read does not include interpretation of cardiac or coronary anatomy or pathology. The coronary calcium  score/coronary CTA interpretation by the cardiologist is attached.  COMPARISON:  Chest CT 09/18/2017.  FINDINGS: Aortic atherosclerosis. Within the visualized portions of the thorax there are no suspicious appearing pulmonary nodules or masses, there is no acute consolidative airspace disease, no pleural effusions, no pneumothorax and no lymphadenopathy. Visualized portions of the upper abdomen are unremarkable. There are no aggressive appearing lytic or blastic lesions noted in the visualized portions of the skeleton.  IMPRESSION: 1.  Aortic Atherosclerosis (ICD10-I70.0).  Electronically Signed: By: Toribio Aye M.D. On: 10/23/2017 09:36  ______________________________________________________________________________________________       Current Reported Medications:.    Current Meds  Medication Sig   ACCU-CHEK AVIVA PLUS test strip USE TO MONITOR BLOOD SUGARS DAILY   blood glucose meter kit and supplies Use to test blood sugar daily. Dx: E11.9   clopidogrel  (PLAVIX ) 75 MG tablet TAKE 1 TABLET BY MOUTH EVERY DAY   clotrimazole -betamethasone  (LOTRISONE ) cream Apply 1 Application topically daily.   ezetimibe  (ZETIA ) 10 MG tablet TAKE 1 TABLET BY MOUTH EVERY DAY   glipiZIDE  (GLUCOTROL ) 5 MG tablet TAKE 1 TABLET (5 MG TOTAL) BY MOUTH TWICE A DAY BEFORE MEALS   glucose blood (ACCU-CHEK AVIVA PLUS) test strip Use as  instructed   Lancets MISC Use to test blood sugar daily. Dx code :E11.9   losartan  (COZAAR ) 50 MG tablet TAKE 1 TABLET BY MOUTH EVERY DAY   metoprolol  succinate (TOPROL -XL) 25 MG 24 hr tablet TAKE 1 TABLET (25 MG TOTAL) BY MOUTH DAILY.   Multiple Vitamin (MULTIVITAMIN) tablet Take 1 tablet by mouth daily.   Semaglutide ,0.25 or 0.5MG /DOS, (OZEMPIC , 0.25 OR 0.5 MG/DOSE,) 2 MG/3ML SOPN Inject 0.5 mg into the skin once a week. Increase to 0.5 mg weekly after 2 weeks if side effects tolerated   tadalafil  (CIALIS ) 10 MG tablet Take 1 tablet (10 mg total) by mouth daily at 6 (six) AM.   testosterone  cypionate (DEPOTESTOSTERONE CYPIONATE) 200 MG/ML injection Inject 0.75 mg into the muscle once a week.   Physical Exam:    VS:  BP 122/64   Pulse 83   Ht 5' 10 (1.778 m)   Wt 213 lb (96.6 kg)   SpO2 94%   BMI 30.56 kg/m    Wt Readings from Last 3 Encounters:  06/29/24 213 lb (96.6 kg)  06/28/24 210 lb (95.3 kg)  12/29/23 217 lb (98.4 kg)    GEN: Well nourished, well developed in no acute distress NECK: No JVD; No carotid bruits CARDIAC: RRR, 3/6 systolic murmur, no rubs or gallops RESPIRATORY:  Clear to auscultation without rales, wheezing or rhonchi  ABDOMEN: Soft, non-tender, non-distended EXTREMITIES:  No edema; No acute deformity     Asessement and Plan:.    CAD: S/p DES to LAD in 2019. Exercise Myoview  in 12/2022 for DOT clearance was negative for ischemia. Stable with no anginal symptoms. Heart healthy diet and regular cardiovascular exercise encouraged.  Patient notes he requires nuclear stress test for DOT clearance.  Reviewed ED precautions.  Continue Plavix  75 mg daily, Zetia  10 mg daily, losartan  50 mg daily and metoprolol  succinate 25 mg daily. Informed Consent   Shared Decision Making/Informed Consent The risks [chest pain, shortness of breath, cardiac arrhythmias, dizziness, blood pressure fluctuations, myocardial infarction, stroke/transient ischemic attack, nausea, vomiting,  allergic reaction, radiation exposure, metallic taste sensation and life-threatening complications (estimated to be 1 in 10,000)], benefits (risk stratification, diagnosing coronary artery disease, treatment guidance) and alternatives of a nuclear stress test were discussed in detail with Mr. Juday and he agrees to proceed.     HTN: Blood pressure today 122/64.  Continue current antihypertensive regimen.  Hyperlipidemia: Last lipid profile on 06/28/2024 indicates total cholesterol 139, HDL 36, triglycerides 93 and LDL 84.  Patient has been followed by Dr. Mona with the advanced lipid clinic.  Unfortunately has been unable to tolerate statin medications, Repatha and Praluent .  Continue Zetia  10 mg daily.  Carotid bruit: Noted on exam in 09/2020, carotid Doppler showed no evidence of carotid artery stenosis.   Aortic stenosis: Echo on 06/21/2024 indicated LVEF  55 to 60%, moderate to severe aortic valve stenosis. Today he denies any chest discomfort, shortness of breath, lower extremity edema, orthopnea or PND.  Reviewed with patient that he should notify the office of any symptoms.  Patient verbalizes understanding.  Will plan to recheck echocardiogram in 6 months.  Type 2 DM: Last hemoglobin A1C 7.2%, monitored and managed per PCP.    Disposition: F/u with Dr. Anner in six months or sooner if needed.   Signed, Keavon Sensing D Lionardo Haze, NP

## 2024-07-06 ENCOUNTER — Ambulatory Visit: Payer: Self-pay

## 2024-07-06 NOTE — Telephone Encounter (Signed)
 FYI Only or Action Required?: Action required by provider: update on patient condition.  Patient was last seen in primary care on 06/28/2024 by Purcell Emil Schanz, MD.  Called Nurse Triage reporting Medication Reaction, Diarrhea, Heartburn, and Abdominal Pain.  Symptoms began about a month ago.  Interventions attempted: OTC medications: acid reducer and Rest, hydration, or home remedies.  Symptoms are: moderate diarrhea, intermittent abdominal pain (upper stomach pain heartburn and lower abdominal pain relieved by BM), abdominal bloating gradually improving.  Triage Disposition: Call PCP Now (overriding See Physician Within 24 Hours)  Patient/caregiver understands and will follow disposition?: Yes        Copied from CRM [id number]. Topic: Clinical - Red Word Triage >> Jul 06, 2024 11:34 AM Rosina BIRCH wrote: Reason for CRM: patient called stating he need to change his ozempic  medicine because he is having to many side effects  Stomach upset, heartburn and diarrhea Reason for Disposition  [1] MODERATE pain (e.g., interferes with normal activities) AND [2] pain comes and goes (cramps) AND [3] present > 24 hours  (Exception: Pain with Vomiting or Diarrhea - see that Guideline.)  Answer Assessment - Initial Assessment Questions Patient states this is the second time he has been on Ozempic  and the first time he tried it he had to stop due to side effects. He states he saw Dr Purcell about a week ago and discussed coming off/stopping Ozempic  due to side effects. He states he has not taken Ozempic  in 2 weeks and the side effects have not subsided.    1. LOCATION: Where does it hurt?      Right in the stomach and lower abdominal pain that precedes diarrhea. Pain is relieved after he has BM.  2. RADIATION: Does the pain shoot anywhere else? (e.g., chest, back)     No.  3. ONSET: When did the pain begin? (Minutes, hours or days ago)      About 4 weeks ago.  4. SUDDEN:  Gradual or sudden onset?     Lower abdominal pain with diarrhea is mostly sudden, sometimes gradual.  5. PATTERN Does the pain come and go, or is it constant?     Comes and goes.  6. SEVERITY: How bad is the pain?  (e.g., Scale 1-10; mild, moderate, or severe)     6-7/10 when the pain comes on. No pain present at this time.  7. RECURRENT SYMPTOM: Have you ever had this type of stomach pain before? If Yes, ask: When was the last time? and What happened that time?      Yes, he states usually only when he had a stomach virus he would feel like this.  8. CAUSE: What do you think is causing the stomach pain? (e.g., gallstones, recent abdominal surgery)     Ozempic .  9. RELIEVING/AGGRAVATING FACTORS: What makes it better or worse? (e.g., antacids, bending or twisting motion, bowel movement)     Bowel movement eases the lower intestinal pain. Patient states he has been taking acid reducers but is still getting indigestion.  10. OTHER SYMPTOMS: Do you have any other symptoms? (e.g., back pain, diarrhea, fever, urination pain, vomiting)       Abdominal bloating, heartburn, diarrhea (last episode was 1 hour ago; about 4 episodes in the past day). Denies nausea, vomiting, fever, blood in stool. Patient states he is drinking plenty of fluids, no issues with dehydration.  Protocols used: Abdominal Pain - Male-A-AH

## 2024-07-07 NOTE — Telephone Encounter (Signed)
 He can follow-up with me anytime he wants

## 2024-07-13 NOTE — Telephone Encounter (Signed)
 Tried to call pt to inform him of providers response to come in anytime to discuss GI issues if he would like.No success please schedule pt if he desires.

## 2024-07-20 ENCOUNTER — Ambulatory Visit

## 2024-07-26 ENCOUNTER — Telehealth (HOSPITAL_COMMUNITY): Payer: Self-pay | Admitting: *Deleted

## 2024-07-26 NOTE — Telephone Encounter (Signed)
 Left message on voicemail per DPR in reference to upcoming appointment scheduled on 08/02/2024 at 7:15 with detailed instructions given per Myocardial Perfusion Study Information Sheet for the test. LM to arrive 15 minutes early, and that it is imperative to arrive on time for appointment to keep from having the test rescheduled. If you need to cancel or reschedule your appointment, please call the office within 24 hours of your appointment. Failure to do so may result in a cancellation of your appointment, and a $50 no show fee. Phone number given for call back for any questions.

## 2024-07-30 ENCOUNTER — Other Ambulatory Visit: Payer: Self-pay | Admitting: Cardiology

## 2024-07-30 ENCOUNTER — Ambulatory Visit

## 2024-07-30 DIAGNOSIS — I2510 Atherosclerotic heart disease of native coronary artery without angina pectoris: Secondary | ICD-10-CM

## 2024-08-02 ENCOUNTER — Ambulatory Visit (HOSPITAL_COMMUNITY)
Admission: RE | Admit: 2024-08-02 | Discharge: 2024-08-02 | Disposition: A | Discharge status: Home / Self Care | Source: Ambulatory Visit | Attending: Cardiovascular Disease | Admitting: Cardiovascular Disease

## 2024-08-02 ENCOUNTER — Ambulatory Visit: Payer: Self-pay | Admitting: Emergency Medicine

## 2024-08-02 ENCOUNTER — Ambulatory Visit: Payer: Self-pay | Admitting: Cardiology

## 2024-08-02 DIAGNOSIS — I2510 Atherosclerotic heart disease of native coronary artery without angina pectoris: Secondary | ICD-10-CM | POA: Diagnosis not present

## 2024-08-02 DIAGNOSIS — R21 Rash and other nonspecific skin eruption: Secondary | ICD-10-CM

## 2024-08-02 LAB — MYOCARDIAL PERFUSION IMAGING
Angina Index: 0
Duke Treadmill Score: 8
Estimated workload: 9.3
Exercise duration (min): 7 min
Exercise duration (sec): 31 s
LV dias vol: 130 mL (ref 62–150)
LV sys vol: 60 mL (ref 4.2–5.8)
MPHR: 144 {beats}/min
Nuc Stress EF: 55 %
Peak HR: 130 {beats}/min
Percent HR: 90 %
Rest HR: 60 {beats}/min
Rest Nuclear Isotope Dose: 10.4 mCi
SDS: 1
SRS: 2
SSS: 3
ST Depression (mm): 0 mm
Stress Nuclear Isotope Dose: 32.7 mCi
TID: 0.97

## 2024-08-02 MED ORDER — TECHNETIUM TC 99M TETROFOSMIN IV KIT
32.7000 | PACK | Freq: Once | INTRAVENOUS | Status: AC | PRN
  Administered 2024-08-02: 32.7 via INTRAVENOUS

## 2024-08-02 MED ORDER — TECHNETIUM TC 99M TETROFOSMIN IV KIT
10.4000 | PACK | Freq: Once | INTRAVENOUS | Status: AC | PRN
  Administered 2024-08-02: 10.4 via INTRAVENOUS

## 2024-08-02 NOTE — Telephone Encounter (Signed)
 FYI Only or Action Required?: Action required by provider: medication refill request.  Patient was last seen in primary care on 06/28/2024 by Purcell Emil Schanz, MD.  Called Nurse Triage reporting Mediation Question.  Symptoms began N/A.  Interventions attempted: Other: N/A.  Symptoms are: N/A.  Triage Disposition: Information or Advice Only Call  Patient/caregiver understands and will follow disposition?: Yes Reason for Disposition  Caller has medicine question only, adult not sick, AND triager answers question  Answer Assessment - Initial Assessment Questions Patient states when taking Ozempic  he had a lot of GI issues and has stopped taking it last month and is doing much better. He is looking for an alternative. Patient also looking for refill on Testosterone  syringes, stated I dont see that on medication list, please advise. Also looking for refill clotrimazole -betamethasone  (LOTRISONE ) cream  1. NAME of MEDICINE: What medicine(s) are you calling about?     Semaglutide ,0.25 or 0.5MG /DOS, (OZEMPIC , 0.25 OR 0.5 MG/DOSE,) 2 MG/3ML SOPN   2. QUESTION: What is your question? (e.g., double dose of medicine, side effect)     Looking for alternative medication   3. PRESCRIBER: Who prescribed the medicine? Reason: if prescribed by specialist, call should be referred to that group.     Purcell Emil Schanz, MD   4. SYMPTOMS: Do you have any symptoms? If Yes, ask: What symptoms are you having?  How bad are the symptoms (e.g., mild, moderate, severe)     GI issues  Protocols used: Medication Question Call-A-AH

## 2024-08-02 NOTE — Telephone Encounter (Signed)
 Okay for requested items.  Ozempic  alternatives are Trulicity  and Mounjaro.

## 2024-08-02 NOTE — Telephone Encounter (Signed)
 OK to send in testosterone  syringes and send in clotrimazole -betamethasone  (LOTRISONE ) cream both are no longer on medication list. Pt is also wanting to know if there is alternative for ozempic  due to it causing him to have GI problems ?

## 2024-08-02 NOTE — Telephone Encounter (Signed)
 This RN made first attempt to contact pt with no answer. A voicemail was left with call back number provided.    Copied from CRM [id number]. Topic: Clinical - Medication Question >> Aug 02, 2024  3:30 PM Avram MATSU wrote: Reason for CRM: patient is calling about Semaglutide ,0.25 or 0.5MG /DOS, (OZEMPIC , 0.25 OR 0.5 MG/DOSE,) 2 MG/3ML SOPN [[id number]] he would like to talk about with a nurse, please advise [phone] (M)

## 2024-08-03 MED ORDER — CLOTRIMAZOLE-BETAMETHASONE 1-0.05 % EX CREA: 1.0000 | TOPICAL_CREAM | Freq: Every day | CUTANEOUS | 0 refills | Status: AC

## 2024-08-03 MED ORDER — SYRINGE/NEEDLE (DISP) 25G X 1" 5 ML MISC: 0 refills | Status: AC

## 2024-08-03 NOTE — Addendum Note (Signed)
 Addended by: ROSALVA LEX RAMAN on: 08/03/2024 08:37 AM   Modules accepted: Orders

## 2024-08-03 NOTE — Telephone Encounter (Signed)
 This has been sent in

## 2024-08-03 NOTE — Telephone Encounter (Signed)
 LVM asking pt to call our office to discuss test results. 1st attempt

## 2024-08-04 ENCOUNTER — Telehealth: Payer: Self-pay

## 2024-08-04 ENCOUNTER — Other Ambulatory Visit: Payer: Self-pay | Admitting: Emergency Medicine

## 2024-08-04 ENCOUNTER — Ambulatory Visit: Payer: Self-pay | Admitting: Cardiology

## 2024-08-04 DIAGNOSIS — E1169 Type 2 diabetes mellitus with other specified complication: Secondary | ICD-10-CM

## 2024-08-04 DIAGNOSIS — E1165 Type 2 diabetes mellitus with hyperglycemia: Secondary | ICD-10-CM

## 2024-08-04 DIAGNOSIS — E1159 Type 2 diabetes mellitus with other circulatory complications: Secondary | ICD-10-CM

## 2024-08-04 MED ORDER — TIRZEPATIDE 2.5 MG/0.5ML ~~LOC~~ SOAJ: 2.5000 mg | SUBCUTANEOUS | 3 refills | Status: AC

## 2024-08-04 NOTE — Telephone Encounter (Signed)
 Patient returned staff call regarding results.

## 2024-08-04 NOTE — Telephone Encounter (Signed)
 Copied from CRM #[id number]. Topic: General - Other >> Aug 04, 2024 10:56 AM Burnard DEL wrote: Reason for CRM: Patient returned call to CMA. Patient stated that he would ike to try monjuro  as an alternative to ozempic .

## 2024-08-04 NOTE — Telephone Encounter (Signed)
 Pt has responded back on another encounter

## 2024-08-04 NOTE — Telephone Encounter (Signed)
 Called patient and LVM 1st attempt on calling

## 2024-08-04 NOTE — Telephone Encounter (Signed)
 New prescription for Mounjaro  sent to pharmacy of record today.  Thanks.

## 2024-08-04 NOTE — Telephone Encounter (Signed)
 Pt agrees to monjuro please send in for patient

## 2024-08-04 NOTE — Telephone Encounter (Signed)
 Spoke with pt regarding test results. Pt verbalized understanding. Pt stated he needs a letter for [employer]. Pt was advise that I would make Katlyn West aware and he would be notified when letter is ready for pick up. Pt had no further questions.

## 2024-08-18 ENCOUNTER — Other Ambulatory Visit: Payer: Self-pay | Admitting: Nurse Practitioner

## 2024-08-18 DIAGNOSIS — I2510 Atherosclerotic heart disease of native coronary artery without angina pectoris: Secondary | ICD-10-CM

## 2024-08-30 ENCOUNTER — Ambulatory Visit

## 2024-08-30 VITALS — Ht 70.0 in | Wt 207.0 lb

## 2024-08-30 DIAGNOSIS — Z0000 Encounter for general adult medical examination without abnormal findings: Secondary | ICD-10-CM | POA: Diagnosis not present

## 2024-08-30 NOTE — Progress Notes (Signed)
 Chief Complaint  Patient presents with   Medicare Wellness     Subjective:   Gregory Mathews. is a 78 y.o. male who presents for a Medicare Annual Wellness Visit.  I connected with  Gregory Mathews. on 08/30/24 by a audio enabled telemedicine application and verified that I am speaking with the correct person using two identifiers.  Patient Location: Home  Provider Location: Office/Clinic  Persons Participating in Visit: Patient.  I discussed the limitations of evaluation and management by telemedicine. The patient expressed understanding and agreed to proceed.  Vital Signs: Because this visit was a virtual/telehealth visit, some criteria may be missing or patient reported. Any vitals not documented were not able to be obtained and vitals that have been documented are patient reported.   Visit info / Clinical Intake: Medicare Wellness Visit Type:: Subsequent Annual Wellness Visit Persons participating in visit and providing information:: patient Medicare Wellness Visit Mode:: Telephone If telephone:: video declined Since this visit was completed virtually, some vitals may be partially provided or unavailable. Missing vitals are due to the limitations of the virtual format.: Documented vitals are patient reported If Telephone or Video please confirm:: I connected with patient using audio/video enable telemedicine. I verified patient identity with two identifiers, discussed telehealth limitations, and patient agreed to proceed. Patient Location:: Home Provider Location:: Office Interpreter Needed?: No Pre-visit prep was completed: yes AWV questionnaire completed by patient prior to visit?: no Living arrangements:: lives with spouse/significant other Patient's Overall Health Status Rating: good Typical amount of pain: none Are you currently prescribed opioids?: no  Dietary Habits and Nutritional Risks How many meals a day?: 2 Eats fruit and vegetables daily?: yes Most  meals are obtained by: preparing own meals; having others provide food In the last 2 weeks, have you had any of the following?: none Diabetic:: (!) yes Any non-healing wounds?: no How often do you check your BS?: 1 (94) Would you like to be referred to a Nutritionist or for Diabetic Management? : no  Functional Status Activities of Daily Living (to include ambulation/medication): Independent Ambulation: Independent with device- listed below Home Assistive Devices/Equipment: Dentures (specify type) Medication Administration: Independent Home Management (perform basic housework or laundry): Independent Manage your own finances?: yes Primary transportation is: driving Concerns about vision?: no *vision screening is required for WTM* Concerns about hearing?: no  Fall Screening Falls in the past year?: 0 Number of falls in past year: 0 Was there an injury with Fall?: 0 Fall Risk Category Calculator: 0 Patient Fall Risk Level: Low Fall Risk  Fall Risk Patient at Risk for Falls Due to: No Fall Risks Fall risk Follow up: Falls evaluation completed; Falls prevention discussed  Home and Transportation Safety: All rugs have non-skid backing?: N/A, no rugs All stairs or steps have railings?: N/A, no stairs Grab bars in the bathtub or shower?: (!) no Have non-skid surface in bathtub or shower?: yes Good home lighting?: yes Regular seat belt use?: yes Hospital stays in the last year:: no  Cognitive Assessment Difficulty concentrating, remembering, or making decisions? : no Will 6CIT or Mini Cog be Completed: yes 6CIT or Mini Cog Declined: patient alert, oriented, able to answer questions appropriately and recall recent events What year is it?: 0 points What month is it?: 0 points Give patient an address phrase to remember (5 components): [address] [place], [place] About what time is it?: 0 points Count backwards from 20 to 1: 0 points Say the months of the year in reverse:  0  points Repeat the address phrase from earlier: 0 points 6 CIT Score: 0 points  Advance Directives (For Healthcare) Does Patient Have a Medical Advance Directive?: Yes Does patient want to make changes to medical advance directive?: Yes (Inpatient - patient requests chaplain consult to change a medical advance directive) Type of Advance Directive: Healthcare Power of [place]; Living will Copy of Healthcare Power of Attorney in Chart?: No - copy requested Copy of Living Will in Chart?: No - copy requested  Reviewed/Updated  Reviewed/Updated: Reviewed All (Medical, Surgical, Family, Medications, Allergies, Care Teams, Patient Goals)    Allergies (verified) Januvia  [sitagliptin ], Lisinopril , Repatha [evolocumab], and Statins   Current Medications (verified) Outpatient Encounter Medications as of 08/30/2024  Medication Sig   ACCU-CHEK AVIVA PLUS test strip USE TO MONITOR BLOOD SUGARS DAILY   blood glucose meter kit and supplies Use to test blood sugar daily. Dx: E11.9   clopidogrel  (PLAVIX ) 75 MG tablet TAKE 1 TABLET BY MOUTH EVERY DAY   clotrimazole -betamethasone  (LOTRISONE ) cream Apply 1 Application topically daily.   clotrimazole -betamethasone  (LOTRISONE ) cream Apply 1 Application topically daily.   ezetimibe  (ZETIA ) 10 MG tablet TAKE 1 TABLET BY MOUTH EVERY DAY   glipiZIDE  (GLUCOTROL ) 5 MG tablet TAKE 1 TABLET (5 MG TOTAL) BY MOUTH TWICE A DAY BEFORE MEALS   glucose blood (ACCU-CHEK AVIVA PLUS) test strip Use as instructed   glucose blood (ACCU-CHEK GUIDE TEST) test strip USE TO MONITOR BLOOD SUGARS DAILY   Lancets MISC Use to test blood sugar daily. Dx code :E11.9   losartan  (COZAAR ) 50 MG tablet TAKE 1 TABLET BY MOUTH EVERY DAY   metoprolol  succinate (TOPROL -XL) 25 MG 24 hr tablet TAKE 1 TABLET (25 MG TOTAL) BY MOUTH DAILY.   Multiple Vitamin (MULTIVITAMIN) tablet Take 1 tablet by mouth daily.   Syringe/Needle, Disp, 25G X 1 5 ML MISC Once all the medication has been injected,  remove the needle at the same angle you inserted it (90 degrees), Dispose of syringe and needle into sharps.   tadalafil  (CIALIS ) 10 MG tablet Take 1 tablet (10 mg total) by mouth daily at 6 (six) AM.   testosterone  cypionate (DEPOTESTOSTERONE CYPIONATE) 200 MG/ML injection Inject 0.75 mg into the muscle once a week.   tirzepatide  (MOUNJARO ) 2.5 MG/0.5ML Pen Inject 2.5 mg into the skin once a week.   No facility-administered encounter medications on file as of 08/30/2024.    History: Past Medical History:  Diagnosis Date   Allergy    Basal cell carcinoma 01/2013   Nasal and R forearm.  [facility]   CAD S/P percutaneous coronary angioplasty    2/19 PCI/DESx1 to mLAD, FFR 0.7 -Sierra DES 2.75 mm x 18 mm   Cataract    Diabetes mellitus without complication (HCC)    on PO Meds   Essential hypertension    Family history of colon cancer    Family history of malignant neoplasm of breast    Heart murmur    Hyperlipidemia    statin intolerant -- myalgias, fatigue (tried at least 4)   Polyposis of colon    Rosacea    Past Surgical History:  Procedure Laterality Date   basal cell removal     nose and wrist    BELPHAROPTOSIS REPAIR     CORONARY CT ANGIOGRAM  09/2017   Coronary Calcium  Score: 111. Coronary calcification noted in the LEFT MAIN (LM) and prox LAD.  LM < 50% calcified stenosis.  Mid LAD 50-75% plaque noted CT FFR --> positive at  0.80.  Recommend CARDIAC CATHETERIZATION   CORONARY PRESSURE/FFR STUDY N/A 11/12/2017   Procedure: INTRAVASCULAR PRESSURE WIRE/FFR STUDY;  Surgeon: Anner Alm ORN, MD;  Location: [facility] INVASIVE CV LAB;  Service: Cardiovascular;  Laterality: LAD -FFR 0.76   CORONARY STENT INTERVENTION N/A 11/12/2017   Procedure: CORONARY STENT INTERVENTION;  Surgeon: Anner Alm ORN, MD;  Location: [facility] INVASIVE CV LAB;  Service: Cardiovascular:  DES PCI:  STENT SIERRA 2.75 X 18 MM - Post intervention, there is a 0% residual stenosis.   ETT/GXT: Graded Exercise  Tolerance Test  08/2015    Exercised for 9:01 min --> blunted blood pressure response no EKG changes.  No chest pain.  Low Risk   EYE SURGERY     Cataract surgery   eyelid surgery     LEFT HEART CATH AND CORONARY ANGIOGRAPHY N/A 11/12/2017   Procedure: LEFT HEART CATH AND CORONARY ANGIOGRAPHY;  Surgeon: Anner Alm ORN, MD;  Location: [facility] INVASIVE CV LAB: 70% P-M LAD (FFR 0.76).  40% mid LAD, 50% ostial ramus.  EF 55-60%.  Mildly elevated LVEDP.   SHOULDER OPEN ROTATOR CUFF REPAIR  1983   TRANSTHORACIC ECHOCARDIOGRAM  08/2017   EF 55-60%.  Mild aortic stenosis (mean gradient 11 mmH.)  GR 1 DD.  Mild LA dilation.   Family History  Problem Relation Age of Onset   Leukemia Mother 27       d. 72   Stroke Father 31       as a complication of CABG   Colon cancer Father 71   Coronary artery disease Father 40       - Sx was DOE - MV CAD   Peripheral Artery Disease Father        presumptive   Diabetes Sister    Sudden Cardiac Death Sister 66       presumed MI   Heart attack Brother 37       During throat surgery   Congestive Heart Failure Brother        Died @ 1 - ? CHF / ICM   Breast cancer Maternal Grandmother        dx in her 58s   Stroke Maternal Grandfather    Diabetes Brother 57       on lots of meds   Diabetes Brother    Diabetes Paternal Grandmother    Rheumatic fever Brother        childhood RF --> Valvular Dz - valve Sgx,  died young   Stomach cancer Neg Hx    Esophageal cancer Neg Hx    Social History   Occupational History   Occupation: bus Programme Researcher, Broadcasting/film/video: [employer]  Tobacco Use   Smoking status: Former    Current packs/day: 0.00    Average packs/day: 1 pack/day for 15.0 years (15.0 ttl pk-yrs)    Types: Cigarettes    Start date: 07/10/1967    Quit date: 07/09/1982    Years since quitting: 42.1   Smokeless tobacco: Never  Substance and Sexual Activity   Alcohol use: No   Drug use: No   Sexual activity: Yes    Partners:  Female   Tobacco Counseling Counseling given: Not Answered  SDOH Screenings   Food Insecurity: No Food Insecurity (08/30/2024)  Housing: Unknown (08/30/2024)  Transportation Needs: No Transportation Needs (08/30/2024)  Utilities: Not At Risk (08/30/2024)  Alcohol Screen: Low Risk  (06/24/2022)  Depression (PHQ2-9): Low Risk  (08/30/2024)  Financial Resource Strain: Low Risk  (06/24/2022)  Physical Activity: Sufficiently Active (08/30/2024)  Social Connections: Socially Integrated (08/30/2024)  Stress: No Stress Concern Present (08/30/2024)  Tobacco Use: Medium Risk (08/30/2024)  Health Literacy: Adequate Health Literacy (08/30/2024)   See flowsheets for full screening details  Depression Screen PHQ 2 & 9 Depression Scale- Over the past 2 weeks, how often have you been bothered by any of the following problems? Little interest or pleasure in doing things: 0 Feeling down, depressed, or hopeless (PHQ Adolescent also includes...irritable): 0 PHQ-2 Total Score: 0 Trouble falling or staying asleep, or sleeping too much: 0 Feeling tired or having little energy: 0 Poor appetite or overeating (PHQ Adolescent also includes...weight loss): 0 Feeling bad about yourself - or that you are a failure or have let yourself or your family down: 0 Trouble concentrating on things, such as reading the newspaper or watching television (PHQ Adolescent also includes...like school work): 0 Moving or speaking so slowly that other people could have noticed. Or the opposite - being so fidgety or restless that you have been moving around a lot more than usual: 0 Thoughts that you would be better off dead, or of hurting yourself in some way: 0 PHQ-9 Total Score: 0 If you checked off any problems, how difficult have these problems made it for you to do your work, take care of things at home, or get along with other people?: Not difficult at all  Depression Treatment Depression Interventions/Treatment : EYV7-0 Score <4  Follow-up Not Indicated     Goals Addressed               This Visit's Progress     Patient Stated (pt-stated)        Patient stated he plans to continue exercising and managing his diet.  Goal to lose 5-10lbs more.             Objective:    Today's Vitals   08/30/24 1321  Weight: 207 lb (93.9 kg)  Height: 5' 10 (1.778 m)   Body mass index is 29.7 kg/m.  Hearing/Vision screen Hearing Screening - Comments:: Denies hearing difficulties   Vision Screening - Comments:: Denies vision concerns - up-to-date with eye exams with Ozell Bertin Immunizations and Health Maintenance Health Maintenance  Topic Date Due   Bone Density Scan  Never done   COVID-19 Vaccine (1) Never done   FOOT EXAM  11/17/2020   Colonoscopy  10/08/2022   OPHTHALMOLOGY EXAM  12/08/2024   HEMOGLOBIN A1C  12/27/2024   Diabetic kidney evaluation - eGFR measurement  06/28/2025   Diabetic kidney evaluation - Urine ACR  06/28/2025   DTaP/Tdap/Td (3 - Td or Tdap) 07/31/2025   Medicare Annual Wellness (AWV)  08/30/2025   Pneumococcal Vaccine: 50+ Years  Completed   Influenza Vaccine  Completed   Hepatitis C Screening  Completed   Zoster Vaccines- Shingrix  Completed   Meningococcal B Vaccine  Aged Out        Assessment/Plan:  This is a routine wellness examination for [place].  Colonoscopy status: Pt stated was told by Dr Victory Brand f/u due in 2028.   Patient Care Team: Purcell Emil Schanz, MD as PCP - General (Internal Medicine) Anner Alm ORN, MD as PCP - Cardiology (Cardiology) Bertin Ozell, MD as Consulting Physician (Ophthalmology)  I have personally reviewed and noted the following in the patient's chart:   Medical and social history Use of alcohol, tobacco or illicit drugs  Current medications and supplements including opioid prescriptions. Functional ability and status Nutritional status  Physical activity Advanced directives List of other physicians Hospitalizations,  surgeries, and ER visits in previous 12 months Vitals Screenings to include cognitive, depression, and falls Referrals and appointments  No orders of the defined types were placed in this encounter.  In addition, I have reviewed and discussed with patient certain preventive protocols, quality metrics, and best practice recommendations. A written personalized care plan for preventive services as well as general preventive health recommendations were provided to patient.   Verdie CHRISTELLA Saba, CMA   08/30/2024   Return in 1 year (on 08/30/2025).  After Visit Summary: (MyChart) Due to this being a telephonic visit, the after visit summary with patients personalized plan was offered to patient via MyChart   Nurse Notes: Appt w/PCP in 12/2024; scheduled 2026 AWV/CPE asppts

## 2024-08-30 NOTE — Patient Instructions (Signed)
 [name],  Thank you for taking the time for your Medicare Wellness Visit. I appreciate your continued commitment to your health goals. Please review the care plan we discussed, and feel free to reach out if I can assist you further.  Please note that Annual Wellness Visits do not include a physical exam. Some assessments may be limited, especially if the visit was conducted virtually. If needed, we may recommend an in-person follow-up with your provider.  Ongoing Care Seeing your primary care provider every 3 to 6 months helps us  monitor your health and provide consistent, personalized care.   Referrals If a referral was made during today's visit and you haven't received any updates within two weeks, please contact the referred provider directly to check on the status.  Recommended Screenings:  Health Maintenance  Topic Date Due   Osteoporosis screening with Bone Density Scan  Never done   COVID-19 Vaccine (1) Never done   Complete foot exam   11/17/2020   Colon Cancer Screening  10/08/2022   Eye exam for diabetics  12/08/2024   Hemoglobin A1C  12/27/2024   Yearly kidney function blood test for diabetes  06/28/2025   Yearly kidney health urinalysis for diabetes  06/28/2025   DTaP/Tdap/Td vaccine (3 - Td or Tdap) 07/31/2025   Medicare Annual Wellness Visit  08/30/2025   Pneumococcal Vaccine for age over 49  Completed   Flu Shot  Completed   Hepatitis C Screening  Completed   Zoster (Shingles) Vaccine  Completed   Meningitis B Vaccine  Aged Out       08/30/2024    1:23 PM  Advanced Directives  Does Patient Have a Medical Advance Directive? Yes  Type of Estate Agent of [place];Living will  Does patient want to make changes to medical advance directive? Yes (Inpatient - patient requests chaplain consult to change a medical advance directive)  Copy of Healthcare Power of Attorney in Chart? No - copy requested    Vision: Annual vision screenings are  recommended for early detection of glaucoma, cataracts, and diabetic retinopathy. These exams can also reveal signs of chronic conditions such as diabetes and high blood pressure.  Dental: Annual dental screenings help detect early signs of oral cancer, gum disease, and other conditions linked to overall health, including heart disease and diabetes.

## 2024-09-01 ENCOUNTER — Other Ambulatory Visit: Payer: Self-pay | Admitting: Emergency Medicine

## 2024-09-01 DIAGNOSIS — R399 Unspecified symptoms and signs involving the genitourinary system: Secondary | ICD-10-CM

## 2024-10-10 ENCOUNTER — Other Ambulatory Visit: Payer: Self-pay | Admitting: Emergency Medicine

## 2024-10-10 DIAGNOSIS — E1169 Type 2 diabetes mellitus with other specified complication: Secondary | ICD-10-CM

## 2024-12-29 ENCOUNTER — Other Ambulatory Visit (HOSPITAL_COMMUNITY)

## 2025-01-05 ENCOUNTER — Ambulatory Visit: Admitting: Emergency Medicine

## 2025-09-06 ENCOUNTER — Encounter: Admitting: Emergency Medicine

## 2025-09-06 ENCOUNTER — Ambulatory Visit
